# Patient Record
Sex: Male | Born: 1950 | Race: White | Hispanic: No | Smoking: Former smoker
Health system: Southern US, Community
[De-identification: ages and names within clinical notes are randomized; demographics above are authoritative.]

## PROBLEM LIST (undated history)

## (undated) DIAGNOSIS — J449 Chronic obstructive pulmonary disease, unspecified: Secondary | ICD-10-CM

## (undated) DIAGNOSIS — F32A Depression, unspecified: Secondary | ICD-10-CM

## (undated) DIAGNOSIS — Z8489 Family history of other specified conditions: Secondary | ICD-10-CM

## (undated) DIAGNOSIS — I10 Essential (primary) hypertension: Secondary | ICD-10-CM

## (undated) DIAGNOSIS — F419 Anxiety disorder, unspecified: Secondary | ICD-10-CM

## (undated) DIAGNOSIS — I4891 Unspecified atrial fibrillation: Secondary | ICD-10-CM

## (undated) DIAGNOSIS — F0390 Unspecified dementia without behavioral disturbance: Secondary | ICD-10-CM

## (undated) DIAGNOSIS — I6523 Occlusion and stenosis of bilateral carotid arteries: Secondary | ICD-10-CM

## (undated) DIAGNOSIS — N400 Enlarged prostate without lower urinary tract symptoms: Secondary | ICD-10-CM

## (undated) DIAGNOSIS — I509 Heart failure, unspecified: Secondary | ICD-10-CM

## (undated) HISTORY — DX: Occlusion and stenosis of bilateral carotid arteries: I65.23

## (undated) HISTORY — PX: TRANSURETHRAL RESECTION OF PROSTATE: SHX73

---

## 2020-07-28 ENCOUNTER — Emergency Department (HOSPITAL_BASED_OUTPATIENT_CLINIC_OR_DEPARTMENT_OTHER): Payer: Medicare Other

## 2020-07-28 ENCOUNTER — Inpatient Hospital Stay (HOSPITAL_BASED_OUTPATIENT_CLINIC_OR_DEPARTMENT_OTHER)
Admission: EM | Admit: 2020-07-28 | Discharge: 2020-08-09 | DRG: 291 | Disposition: A | Discharge status: Home Health | Payer: Medicare Other | Attending: Family Medicine | Admitting: Family Medicine

## 2020-07-28 ENCOUNTER — Encounter (HOSPITAL_BASED_OUTPATIENT_CLINIC_OR_DEPARTMENT_OTHER): Payer: Self-pay | Admitting: Emergency Medicine

## 2020-07-28 ENCOUNTER — Other Ambulatory Visit: Payer: Self-pay

## 2020-07-28 DIAGNOSIS — F17210 Nicotine dependence, cigarettes, uncomplicated: Secondary | ICD-10-CM | POA: Diagnosis present

## 2020-07-28 DIAGNOSIS — I2584 Coronary atherosclerosis due to calcified coronary lesion: Secondary | ICD-10-CM | POA: Diagnosis present

## 2020-07-28 DIAGNOSIS — I4891 Unspecified atrial fibrillation: Secondary | ICD-10-CM | POA: Diagnosis not present

## 2020-07-28 DIAGNOSIS — R739 Hyperglycemia, unspecified: Secondary | ICD-10-CM | POA: Diagnosis present

## 2020-07-28 DIAGNOSIS — J918 Pleural effusion in other conditions classified elsewhere: Secondary | ICD-10-CM | POA: Diagnosis present

## 2020-07-28 DIAGNOSIS — Z8041 Family history of malignant neoplasm of ovary: Secondary | ICD-10-CM

## 2020-07-28 DIAGNOSIS — I5031 Acute diastolic (congestive) heart failure: Secondary | ICD-10-CM | POA: Diagnosis present

## 2020-07-28 DIAGNOSIS — I480 Paroxysmal atrial fibrillation: Secondary | ICD-10-CM | POA: Diagnosis present

## 2020-07-28 DIAGNOSIS — I3139 Other pericardial effusion (noninflammatory): Secondary | ICD-10-CM | POA: Diagnosis present

## 2020-07-28 DIAGNOSIS — E669 Obesity, unspecified: Secondary | ICD-10-CM | POA: Diagnosis present

## 2020-07-28 DIAGNOSIS — Z6835 Body mass index (BMI) 35.0-35.9, adult: Secondary | ICD-10-CM | POA: Diagnosis not present

## 2020-07-28 DIAGNOSIS — I340 Nonrheumatic mitral (valve) insufficiency: Secondary | ICD-10-CM | POA: Diagnosis not present

## 2020-07-28 DIAGNOSIS — Z800 Family history of malignant neoplasm of digestive organs: Secondary | ICD-10-CM | POA: Diagnosis not present

## 2020-07-28 DIAGNOSIS — Z8349 Family history of other endocrine, nutritional and metabolic diseases: Secondary | ICD-10-CM

## 2020-07-28 DIAGNOSIS — Z79899 Other long term (current) drug therapy: Secondary | ICD-10-CM

## 2020-07-28 DIAGNOSIS — J449 Chronic obstructive pulmonary disease, unspecified: Secondary | ICD-10-CM | POA: Diagnosis present

## 2020-07-28 DIAGNOSIS — Z720 Tobacco use: Secondary | ICD-10-CM | POA: Diagnosis not present

## 2020-07-28 DIAGNOSIS — N400 Enlarged prostate without lower urinary tract symptoms: Secondary | ICD-10-CM | POA: Diagnosis present

## 2020-07-28 DIAGNOSIS — R0602 Shortness of breath: Secondary | ICD-10-CM | POA: Diagnosis present

## 2020-07-28 DIAGNOSIS — J90 Pleural effusion, not elsewhere classified: Secondary | ICD-10-CM

## 2020-07-28 DIAGNOSIS — I509 Heart failure, unspecified: Secondary | ICD-10-CM | POA: Diagnosis not present

## 2020-07-28 DIAGNOSIS — E66812 Obesity, class 2: Secondary | ICD-10-CM

## 2020-07-28 DIAGNOSIS — E7800 Pure hypercholesterolemia, unspecified: Secondary | ICD-10-CM | POA: Diagnosis not present

## 2020-07-28 DIAGNOSIS — Z8249 Family history of ischemic heart disease and other diseases of the circulatory system: Secondary | ICD-10-CM

## 2020-07-28 DIAGNOSIS — R7303 Prediabetes: Secondary | ICD-10-CM | POA: Diagnosis present

## 2020-07-28 DIAGNOSIS — F1010 Alcohol abuse, uncomplicated: Secondary | ICD-10-CM | POA: Diagnosis present

## 2020-07-28 DIAGNOSIS — R0902 Hypoxemia: Secondary | ICD-10-CM

## 2020-07-28 DIAGNOSIS — I110 Hypertensive heart disease with heart failure: Secondary | ICD-10-CM | POA: Diagnosis present

## 2020-07-28 DIAGNOSIS — Z888 Allergy status to other drugs, medicaments and biological substances status: Secondary | ICD-10-CM

## 2020-07-28 DIAGNOSIS — I700 Atherosclerosis of aorta: Secondary | ICD-10-CM | POA: Diagnosis present

## 2020-07-28 DIAGNOSIS — R0600 Dyspnea, unspecified: Secondary | ICD-10-CM

## 2020-07-28 DIAGNOSIS — R9431 Abnormal electrocardiogram [ECG] [EKG]: Secondary | ICD-10-CM | POA: Diagnosis present

## 2020-07-28 DIAGNOSIS — I10 Essential (primary) hypertension: Secondary | ICD-10-CM | POA: Diagnosis not present

## 2020-07-28 DIAGNOSIS — I4819 Other persistent atrial fibrillation: Secondary | ICD-10-CM

## 2020-07-28 DIAGNOSIS — J9601 Acute respiratory failure with hypoxia: Secondary | ICD-10-CM | POA: Diagnosis present

## 2020-07-28 DIAGNOSIS — E785 Hyperlipidemia, unspecified: Secondary | ICD-10-CM | POA: Diagnosis present

## 2020-07-28 DIAGNOSIS — I2510 Atherosclerotic heart disease of native coronary artery without angina pectoris: Secondary | ICD-10-CM | POA: Diagnosis present

## 2020-07-28 DIAGNOSIS — K760 Fatty (change of) liver, not elsewhere classified: Secondary | ICD-10-CM | POA: Diagnosis present

## 2020-07-28 DIAGNOSIS — J9811 Atelectasis: Secondary | ICD-10-CM | POA: Diagnosis present

## 2020-07-28 DIAGNOSIS — E871 Hypo-osmolality and hyponatremia: Secondary | ICD-10-CM | POA: Diagnosis present

## 2020-07-28 DIAGNOSIS — Z881 Allergy status to other antibiotic agents status: Secondary | ICD-10-CM

## 2020-07-28 DIAGNOSIS — I313 Pericardial effusion (noninflammatory): Secondary | ICD-10-CM | POA: Diagnosis present

## 2020-07-28 DIAGNOSIS — F32A Depression, unspecified: Secondary | ICD-10-CM | POA: Diagnosis present

## 2020-07-28 DIAGNOSIS — Z20822 Contact with and (suspected) exposure to covid-19: Secondary | ICD-10-CM | POA: Diagnosis present

## 2020-07-28 DIAGNOSIS — R059 Cough, unspecified: Secondary | ICD-10-CM

## 2020-07-28 DIAGNOSIS — Z9079 Acquired absence of other genital organ(s): Secondary | ICD-10-CM

## 2020-07-28 DIAGNOSIS — R0603 Acute respiratory distress: Secondary | ICD-10-CM

## 2020-07-28 DIAGNOSIS — J189 Pneumonia, unspecified organism: Secondary | ICD-10-CM

## 2020-07-28 HISTORY — DX: Atherosclerosis of aorta: I70.0

## 2020-07-28 HISTORY — DX: Hyperlipidemia, unspecified: E78.5

## 2020-07-28 HISTORY — DX: Depression, unspecified: F32.A

## 2020-07-28 HISTORY — DX: Essential (primary) hypertension: I10

## 2020-07-28 HISTORY — DX: Benign prostatic hyperplasia without lower urinary tract symptoms: N40.0

## 2020-07-28 HISTORY — DX: Family history of other specified conditions: Z84.89

## 2020-07-28 HISTORY — DX: Coronary atherosclerosis due to calcified coronary lesion: I25.84

## 2020-07-28 HISTORY — DX: Obesity, class 2: E66.812

## 2020-07-28 HISTORY — DX: Atherosclerotic heart disease of native coronary artery without angina pectoris: I25.10

## 2020-07-28 HISTORY — DX: Obesity, unspecified: E66.9

## 2020-07-28 HISTORY — DX: Tobacco use: Z72.0

## 2020-07-28 LAB — CBC WITH DIFFERENTIAL/PLATELET
Abs Immature Granulocytes: 0.04 10*3/uL (ref 0.00–0.07)
Basophils Absolute: 0 10*3/uL (ref 0.0–0.1)
Basophils Relative: 0 %
Eosinophils Absolute: 0 10*3/uL (ref 0.0–0.5)
Eosinophils Relative: 0 %
HCT: 44.4 % (ref 39.0–52.0)
Hemoglobin: 15.8 g/dL (ref 13.0–17.0)
Immature Granulocytes: 1 %
Lymphocytes Relative: 6 %
Lymphs Abs: 0.5 10*3/uL — ABNORMAL LOW (ref 0.7–4.0)
MCH: 33.1 pg (ref 26.0–34.0)
MCHC: 35.6 g/dL (ref 30.0–36.0)
MCV: 93.1 fL (ref 80.0–100.0)
Monocytes Absolute: 0.9 10*3/uL (ref 0.1–1.0)
Monocytes Relative: 11 %
Neutro Abs: 6.7 10*3/uL (ref 1.7–7.7)
Neutrophils Relative %: 82 %
Platelets: 209 10*3/uL (ref 150–400)
RBC: 4.77 MIL/uL (ref 4.22–5.81)
RDW: 12.8 % (ref 11.5–15.5)
WBC: 8.2 10*3/uL (ref 4.0–10.5)
nRBC: 0 % (ref 0.0–0.2)

## 2020-07-28 LAB — BRAIN NATRIURETIC PEPTIDE: B Natriuretic Peptide: 302.6 pg/mL — ABNORMAL HIGH (ref 0.0–100.0)

## 2020-07-28 LAB — COMPREHENSIVE METABOLIC PANEL
ALT: 14 U/L (ref 0–44)
AST: 19 U/L (ref 15–41)
Albumin: 4.1 g/dL (ref 3.5–5.0)
Alkaline Phosphatase: 102 U/L (ref 38–126)
Anion gap: 10 (ref 5–15)
BUN: 10 mg/dL (ref 8–23)
CO2: 30 mmol/L (ref 22–32)
Calcium: 8.7 mg/dL — ABNORMAL LOW (ref 8.9–10.3)
Chloride: 84 mmol/L — ABNORMAL LOW (ref 98–111)
Creatinine, Ser: 0.5 mg/dL — ABNORMAL LOW (ref 0.61–1.24)
GFR, Estimated: >60 mL/min (ref >60)
Glucose, Bld: 130 mg/dL — ABNORMAL HIGH (ref 70–99)
Potassium: 4.3 mmol/L (ref 3.5–5.1)
Sodium: 124 mmol/L — ABNORMAL LOW (ref 135–145)
Total Bilirubin: 1 mg/dL (ref 0.3–1.2)
Total Protein: 7.2 g/dL (ref 6.5–8.1)

## 2020-07-28 LAB — TROPONIN I (HIGH SENSITIVITY)
Troponin I (High Sensitivity): 2 ng/L (ref <18)
Troponin I (High Sensitivity): 2 ng/L (ref <18)

## 2020-07-28 LAB — D-DIMER, QUANTITATIVE: D-Dimer, Quant: 1.21 ug/mL-FEU — ABNORMAL HIGH (ref 0.00–0.50)

## 2020-07-28 LAB — TSH: TSH: 1.92 u[IU]/mL (ref 0.350–4.500)

## 2020-07-28 LAB — LACTIC ACID, PLASMA
Lactic Acid, Venous: 2.1 mmol/L (ref 0.5–1.9)
Lactic Acid, Venous: 0.8 mmol/L (ref 0.5–1.9)

## 2020-07-28 LAB — RESP PANEL BY RT-PCR (FLU A&B, COVID) ARPGX2
Influenza A by PCR: NEGATIVE
Influenza B by PCR: NEGATIVE
SARS Coronavirus 2 by RT PCR: NEGATIVE

## 2020-07-28 LAB — MAGNESIUM: Magnesium: 1.6 mg/dL — ABNORMAL LOW (ref 1.7–2.4)

## 2020-07-28 MED ORDER — HEPARIN (PORCINE) 25000 UT/250ML-% IV SOLN
1800.0000 [IU]/h | INTRAVENOUS | Status: DC
  Administered 2020-07-28: 1300 [IU]/h via INTRAVENOUS
  Administered 2020-07-29 (×2): 1600 [IU]/h via INTRAVENOUS
  Administered 2020-07-30 – 2020-07-31 (×2): 1650 [IU]/h via INTRAVENOUS
  Administered 2020-08-01 – 2020-08-02 (×3): 1800 [IU]/h via INTRAVENOUS
  Filled 2020-07-28 (×10): qty 250

## 2020-07-28 MED ORDER — POTASSIUM CHLORIDE CRYS ER 20 MEQ PO TBCR
20.0000 meq | EXTENDED_RELEASE_TABLET | Freq: Every day | ORAL | Status: DC
  Administered 2020-07-28 – 2020-08-03 (×7): 20 meq via ORAL
  Filled 2020-07-28 (×7): qty 1

## 2020-07-28 MED ORDER — PRAVASTATIN SODIUM 10 MG PO TABS
20.0000 mg | ORAL_TABLET | Freq: Every day | ORAL | Status: DC
Start: 2020-07-29 — End: 2020-08-03
  Administered 2020-07-29 – 2020-08-02 (×5): 20 mg via ORAL
  Filled 2020-07-28 (×5): qty 2

## 2020-07-28 MED ORDER — FUROSEMIDE 10 MG/ML IJ SOLN
20.0000 mg | Freq: Two times a day (BID) | INTRAMUSCULAR | Status: DC
  Administered 2020-07-28 – 2020-07-29 (×2): 20 mg via INTRAVENOUS
  Filled 2020-07-28 (×2): qty 2

## 2020-07-28 MED ORDER — ACETAMINOPHEN 650 MG RE SUPP: 650.0000 mg | Freq: Four times a day (QID) | RECTAL | Status: DC | PRN

## 2020-07-28 MED ORDER — IOHEXOL 350 MG/ML SOLN
100.0000 mL | Freq: Once | INTRAVENOUS | Status: AC | PRN
  Administered 2020-07-28: 100 mL via INTRAVENOUS

## 2020-07-28 MED ORDER — SODIUM CHLORIDE 0.9 % IV SOLN
1.0000 g | Freq: Once | INTRAVENOUS | Status: AC
  Administered 2020-07-28: 1 g via INTRAVENOUS
  Filled 2020-07-28: qty 10

## 2020-07-28 MED ORDER — ADULT MULTIVITAMIN W/MINERALS CH
1.0000 | ORAL_TABLET | Freq: Every day | ORAL | Status: DC
  Administered 2020-07-29 – 2020-08-09 (×12): 1 via ORAL
  Filled 2020-07-28 (×12): qty 1

## 2020-07-28 MED ORDER — MAGNESIUM SULFATE 2 GM/50ML IV SOLN
2.0000 g | Freq: Once | INTRAVENOUS | Status: AC
  Administered 2020-07-28: 2 g via INTRAVENOUS
  Filled 2020-07-28: qty 50

## 2020-07-28 MED ORDER — FOLIC ACID 1 MG PO TABS
1.0000 mg | ORAL_TABLET | Freq: Every day | ORAL | Status: DC
  Administered 2020-07-29 – 2020-08-09 (×12): 1 mg via ORAL
  Filled 2020-07-28 (×12): qty 1

## 2020-07-28 MED ORDER — MONTELUKAST SODIUM 10 MG PO TABS
10.0000 mg | ORAL_TABLET | Freq: Every day | ORAL | Status: DC
  Administered 2020-07-29 – 2020-08-09 (×12): 10 mg via ORAL
  Filled 2020-07-28 (×12): qty 1

## 2020-07-28 MED ORDER — FUROSEMIDE 10 MG/ML IJ SOLN
20.0000 mg | Freq: Two times a day (BID) | INTRAMUSCULAR | Status: DC
Start: 2020-07-29 — End: 2020-07-28

## 2020-07-28 MED ORDER — ACETAMINOPHEN 325 MG PO TABS
650.0000 mg | ORAL_TABLET | Freq: Four times a day (QID) | ORAL | Status: DC | PRN
  Administered 2020-07-31 – 2020-08-04 (×7): 650 mg via ORAL
  Filled 2020-07-28 (×7): qty 2

## 2020-07-28 MED ORDER — HEPARIN BOLUS VIA INFUSION
4500.0000 [IU] | Freq: Once | INTRAVENOUS | Status: AC
  Administered 2020-07-28: 4500 [IU] via INTRAVENOUS

## 2020-07-28 MED ORDER — FLUOXETINE HCL 20 MG PO CAPS
20.0000 mg | ORAL_CAPSULE | Freq: Every day | ORAL | Status: DC
  Administered 2020-07-28 – 2020-08-08 (×12): 20 mg via ORAL
  Filled 2020-07-28 (×12): qty 1

## 2020-07-28 MED ORDER — LISINOPRIL 5 MG PO TABS
2.5000 mg | ORAL_TABLET | Freq: Every day | ORAL | Status: DC
  Administered 2020-07-28 – 2020-07-29 (×2): 2.5 mg via ORAL
  Filled 2020-07-28 (×2): qty 1

## 2020-07-28 MED ORDER — TERAZOSIN HCL 5 MG PO CAPS
5.0000 mg | ORAL_CAPSULE | Freq: Every day | ORAL | Status: DC
  Administered 2020-07-29 – 2020-08-09 (×12): 5 mg via ORAL
  Filled 2020-07-28 (×12): qty 1

## 2020-07-28 MED ORDER — PROCHLORPERAZINE EDISYLATE 10 MG/2ML IJ SOLN
10.0000 mg | Freq: Four times a day (QID) | INTRAMUSCULAR | Status: DC | PRN
  Filled 2020-07-28: qty 2

## 2020-07-28 MED ORDER — SODIUM CHLORIDE 0.9 % IV SOLN
500.0000 mg | Freq: Once | INTRAVENOUS | Status: AC
  Administered 2020-07-28: 500 mg via INTRAVENOUS
  Filled 2020-07-28: qty 500

## 2020-07-28 MED ORDER — THIAMINE HCL 100 MG PO TABS
100.0000 mg | ORAL_TABLET | Freq: Every day | ORAL | Status: DC
  Administered 2020-07-29 – 2020-08-09 (×12): 100 mg via ORAL
  Filled 2020-07-28 (×12): qty 1

## 2020-07-28 MED ORDER — METOPROLOL TARTRATE 50 MG PO TABS
50.0000 mg | ORAL_TABLET | Freq: Two times a day (BID) | ORAL | Status: DC
  Administered 2020-07-29 – 2020-08-03 (×10): 50 mg via ORAL
  Filled 2020-07-28 (×10): qty 1

## 2020-07-28 NOTE — ED Triage Notes (Signed)
Reports having SOB for about three days.  Denies any hx of respiratory issues.  Also endorses fatigue.

## 2020-07-28 NOTE — ED Notes (Signed)
Pt's shirt, glasses, cell phone, med list placed in belongings bag and given to pt.

## 2020-07-28 NOTE — ED Notes (Signed)
Report given to [facility]. 

## 2020-07-28 NOTE — ED Provider Notes (Signed)
[facility] EMERGENCY DEPARTMENT Provider Note   CSN: [id number] Arrival date & time: 07/28/20  1006     History Chief Complaint  Patient presents with   Shortness of Breath    Samuel Barnes is a 70 y.o. male.  The history is provided by the patient and medical records. No language interpreter was used.  Shortness of Breath Severity:  Severe Onset quality:  Gradual Duration:  4 days Timing:  Constant Progression:  Waxing and waning Chronicity:  New Context: URI   Relieved by:  Nothing Worsened by:  Exertion, deep breathing and coughing Ineffective treatments:  None tried Associated symptoms: cough and sputum production   Associated symptoms: no abdominal pain, no chest pain, no claudication, no diaphoresis, no ear pain, no fever (chills at home), no headaches, no neck pain, no rash, no vomiting and no wheezing   Risk factors: no hx of PE/DVT       Past Medical History:  Diagnosis Date   BPH (benign prostatic hyperplasia)    Depression    Elevated cholesterol    Hypertension     There are no problems to display for this patient.     No family history on file.  Social History   Tobacco Use   Smoking status: Every Day    Types: Cigarettes   Smokeless tobacco: Never  Vaping Use   Vaping Use: Never used  Substance Use Topics   Alcohol use: Yes    Alcohol/week: 1.0 standard drink    Types: 1 Shots of liquor per week    Comment: 3-4 times a week   Drug use: Never    Home Medications Prior to Admission medications   Not on File    Allergies    Patient has no allergy information on record.  Review of Systems   Review of Systems  Constitutional:  Positive for chills and fatigue. Negative for diaphoresis and fever (chills at home).  HENT:  Negative for congestion and ear pain.   Eyes:  Negative for visual disturbance.  Respiratory:  Positive for cough, sputum production, chest tightness and shortness of breath. Negative for  wheezing.   Cardiovascular:  Negative for chest pain, palpitations, claudication and leg swelling.  Gastrointestinal:  Negative for abdominal distention, abdominal pain, constipation, diarrhea, nausea and vomiting.  Genitourinary:  Negative for flank pain.  Musculoskeletal:  Negative for back pain, neck pain and neck stiffness.  Skin:  Negative for rash and wound.  Neurological:  Positive for light-headedness. Negative for dizziness, syncope, weakness and headaches.  Psychiatric/Behavioral:  Negative for agitation and confusion.   All other systems reviewed and are negative.  Physical Exam Updated Vital Signs BP (!) 158/99 (BP Location: Right Arm)   Pulse 71   Temp 97.9 F (36.6 C) (Oral)   Resp 20   Ht 5\' 8"  (1.727 m)   Wt 104.9 kg   SpO2 97%   BMI 35.16 kg/m   Physical Exam Vitals and nursing note reviewed.  Constitutional:      General: He is not in acute distress.    Appearance: He is well-developed. He is not ill-appearing, toxic-appearing or diaphoretic.  HENT:     Head: Normocephalic and atraumatic.     Mouth/Throat:     Mouth: Mucous membranes are moist.     Pharynx: No pharyngeal swelling or oropharyngeal exudate.  Eyes:     Conjunctiva/sclera: Conjunctivae normal.  Cardiovascular:     Rate and Rhythm: Normal rate. Rhythm irregular.  Pulses: Normal pulses.     Heart sounds: No murmur heard. Pulmonary:     Effort: Pulmonary effort is normal. Tachypnea present. No respiratory distress.     Breath sounds: Decreased breath sounds (mildly diffuse) and rhonchi present. No wheezing or rales.  Chest:     Chest wall: No tenderness.  Abdominal:     Palpations: Abdomen is soft.     Tenderness: There is no abdominal tenderness.  Musculoskeletal:     Cervical back: Neck supple.     Right lower leg: No tenderness. No edema.     Left lower leg: No tenderness. No edema.  Skin:    General: Skin is warm and dry.     Capillary Refill: Capillary refill takes less than 2  seconds.     Findings: No erythema.  Neurological:     General: No focal deficit present.     Mental Status: He is alert.  Psychiatric:        Mood and Affect: Mood normal. Mood is not anxious.    ED Results / Procedures / Treatments   Labs (all labs ordered are listed, but only abnormal results are displayed) Labs Reviewed  CBC WITH DIFFERENTIAL/PLATELET - Abnormal; Notable for the following components:      Result Value   Lymphs Abs 0.5 (*)    All other components within normal limits  COMPREHENSIVE METABOLIC PANEL - Abnormal; Notable for the following components:   Sodium 124 (*)    Chloride 84 (*)    Glucose, Bld 130 (*)    Creatinine, Ser 0.50 (*)    Calcium 8.7 (*)    All other components within normal limits  LACTIC ACID, PLASMA - Abnormal; Notable for the following components:   Lactic Acid, Venous 2.1 (*)    All other components within normal limits  D-DIMER, QUANTITATIVE - Abnormal; Notable for the following components:   D-Dimer, Quant 1.21 (*)    All other components within normal limits  MAGNESIUM - Abnormal; Notable for the following components:   Magnesium 1.6 (*)    All other components within normal limits  BRAIN NATRIURETIC PEPTIDE - Abnormal; Notable for the following components:   B Natriuretic Peptide 302.6 (*)    All other components within normal limits  RESP PANEL BY RT-PCR (FLU A&B, COVID) ARPGX2  CULTURE, BLOOD (ROUTINE X 2)  CULTURE, BLOOD (ROUTINE X 2)  LACTIC ACID, PLASMA  TSH  TROPONIN I (HIGH SENSITIVITY)  TROPONIN I (HIGH SENSITIVITY)    EKG EKG Interpretation  Date/Time:  Friday July 28 2020 10:10:52 EDT Ventricular Rate:  80 PR Interval:    QRS Duration: 89 QT Interval:  525 QTC Calculation: 571 R Axis:   30 Text Interpretation: Atrial fibrillation Ventricular premature complex Anterior infarct, age indeterminate Prolonged QT interval NO prior ECG for comparison. No STEMI Confirmed by Theda Belfast ([id number]) on 07/28/2020 10:22:54  AM  Radiology CT Angio Chest PE W and/or Wo Contrast  Result Date: 07/28/2020 CLINICAL DATA:  Shortness of breath for 3 days.  Elevated D-dimer. EXAM: CT ANGIOGRAPHY CHEST WITH CONTRAST TECHNIQUE: Multidetector CT imaging of the chest was performed using the standard protocol during bolus administration of intravenous contrast. Multiplanar CT image reconstructions and MIPs were obtained to evaluate the vascular anatomy. CONTRAST:  OMNIPAQUE IOHEXOL 350 MG/ML SOLN COMPARISON:  CT chest, abdomen and pelvis 04/18/2017. Single-view of the chest today and PA and lateral chest 03/02/2017. FINDINGS: Cardiovascular: Satisfactory of opacification of the pulmonary arteries to the segmental level. No  pulmonary embolus is seen. There is cardiomegaly. Small pericardial effusion is present. No aortic aneurysm. Calcific aortic and coronary atherosclerosis. Mediastinum/Nodes: No enlarged mediastinal, hilar, or axillary lymph nodes. Thyroid gland, trachea, and esophagus demonstrate no significant findings. Lungs/Pleura: Moderate right and very small left pleural effusions are seen. There is compressive atelectasis in the right lower lobe due to the patient's effusion. No consolidative process is identified. No nodule or mass is seen. Upper Abdomen: The liver is low attenuating consistent with fatty infiltration. A simple cyst measuring 3.4 cm is identified. There is some thickening of the adrenal glands compatible with hyperplasia, unchanged. Left renal cysts are partially imaged. Musculoskeletal: No acute abnormality or focal lesion. Remote healed right rib fractures are noted. Review of the MIP images confirms the above findings. IMPRESSION: Negative for pulmonary embolus. Moderate right and small left pleural effusions with compressive atelectasis in the right lower lobe. Cause for the effusions is not identified. Cardiomegaly. Small pericardial effusion. Calcific coronary artery disease. Fatty infiltration of the  liver. Aortic Atherosclerosis (ICD10-I70.0). Electronically Signed   By: Drusilla Kannerhomas  Dalessio M.D.   On: 07/28/2020 13:37   DG Chest Portable 1 View  Result Date: 07/28/2020 CLINICAL DATA:  cough, chills, hypoxia, shortness of breath. new afib EXAM: PORTABLE CHEST 1 VIEW COMPARISON:  04/09/2017 FINDINGS: Mild cardiomegaly. Cardiac silhouette has enlarged compared to the previous study. Atherosclerotic calcification of the aortic knob. Moderate-sized right-sided pleural effusion with hazy right basilar airspace opacity. No left-sided pleural effusion is seen. No pneumothorax. Multiple chronic right-sided rib fractures. IMPRESSION: 1. Moderate right-sided pleural effusion with hazy right basilar airspace opacity, which may reflect atelectasis versus pneumonia. 2. Cardiac silhouette has enlarged compared to the previous study. Underlying pericardial effusion not excluded. Electronically Signed   By: Duanne GuessNicholas  Plundo D.O.   On: 07/28/2020 11:17    Procedures Procedures     CHA2DS2-VASc Score =    This indicates a 2.2% annual risk of stroke. The patient's score is based upon: CHF History: No HTN History: Yes Diabetes History: No Stroke History: No Vascular Disease History: No Age Score: 1 Gender Score: 0     Medications Ordered in ED Medications  cefTRIAXone (ROCEPHIN) 1 g in sodium chloride 0.9 % 100 mL IVPB (has no administration in time range)  azithromycin (ZITHROMAX) 500 mg in sodium chloride 0.9 % 250 mL IVPB (has no administration in time range)  iohexol (OMNIPAQUE) 350 MG/ML injection 100 mL (100 mLs Intravenous Contrast Given 07/28/20 1252)    ED Course  I have reviewed the triage vital signs and the nursing notes.  Pertinent labs & imaging results that were available during my care of the patient were reviewed by me and considered in my medical decision making (see chart for details).    MDM Rules/Calculators/A&P                           Jake ChurchHughes P Edwards is a 70 y.o.  male with a past medical history significant for hypertension, hypercholesterolemia, BPH status post TURP who presents with several days of worsening fatigue, shortness of breath, mildly productive cough, chills, and lightheadedness.  Patient reports that he has not had any fevers at home but felt some chills.  He reports he has had significant fatigue with no energy and today was having worsening shortness of breath.  He denies any chest pain or palpitations and denies any history of A. fib or a flutter.  No reported history of cardiac difficulties.  He denies new leg pain or leg swelling.  Denies any trauma.  He denies nausea, vomiting, constipation, diarrhea, or urinary changes.  On arrival, patient was hypoxic in the mid 80s.  He is now on 4 L to maintain oxygen saturations in the low 90s.  On exam, lungs have some rhonchi and poor air movement but did not appreciate significant wheezing.  Chest and abdomen nontender.  Good pulses in extremities.  No significant edema seen.  Patient was not tachycardic but was slightly tachypneic.  EKG on arrival shows atrial fibrillation.  Patient does not have palpitations and has no history of this, unclear when he went into A. fib but this could be contributing to his fatigue.  Clinically I suspect patient may have either pneumonia, fluid overload, COVID, or even a PE.  We will get screening labs and work-up.  As he is now on 4 L and is now on oxygen normally, he will need admission.  2:30 PM Work-up as continue to return.  Patient had an elevated D-dimer and a CT PE study was ordered.  No evidence of PE but does show some pleural effusions and atelectasis.  Given the patient's cough, chills, and hypoxia with this and the x-ray showing concern for pneumonia, will treat with antibiotics for pneumonia.  Patient is in A. fib so I suspect that A. fib may have contributed to some fluid overload.  Heart rate is in the 80s however and he is rate controlled.  He has no  history of A. fib.  Due to the hypoxia, new A. fib, and pneumonia, patient will be admitted for further management.  COVID and flu are negative.   Final Clinical Impression(s) / ED Diagnoses Final diagnoses:  New onset a-fib (HCC)  Community acquired pneumonia, unspecified laterality  Pleural effusion  Hypoxia  Cough     Clinical Impression: 1. New onset a-fib (HCC)   2. Community acquired pneumonia, unspecified laterality   3. Pleural effusion   4. Hypoxia   5. Cough     Disposition: Admit  This note was prepared with assistance of Dragon voice recognition software. Occasional wrong-word or sound-a-like substitutions may have occurred due to the inherent limitations of voice recognition software.     Nathian Stencil, Canary Brim, MD 07/28/20 [phone]

## 2020-07-28 NOTE — Progress Notes (Addendum)
Patient arrived to unit with Heparin gtt at 1300 units per hour. Tele monitor placed on patient, vitals obtained. Patient placement alerted of patient arrival. Dinner tray order.

## 2020-07-28 NOTE — Progress Notes (Signed)
ANTICOAGULATION CONSULT NOTE - Initial Consult  Pharmacy Consult for heparin Indication: atrial fibrillation  Not on File  Patient Measurements: Height: 5\' 8"  (172.7 cm) Weight: 104.9 kg (231 lb 4.2 oz) IBW/kg (Calculated) : 68.4 Heparin Dosing Weight: 91.3kg  Vital Signs: Temp: 97.9 F (36.6 C) (07/22 1008) Temp Source: Oral (07/22 1008) BP: 153/93 (07/22 1512) Pulse Rate: 72 (07/22 1512)  Labs: Recent Labs    07/28/20 1051 07/28/20 1249  HGB 15.8  --   HCT 44.4  --   PLT 209  --   CREATININE 0.50*  --   TROPONINIHS 2 2    Estimated Creatinine Clearance: 99.4 mL/min (A) (by C-G formula based on SCr of 0.5 mg/dL (L)).   Medical History: Past Medical History:  Diagnosis Date   BPH (benign prostatic hyperplasia)    Depression    Elevated cholesterol    Hypertension     Assessment: 89 YOM presenting with fatigue, SOB, with new onset afib.  He is not on anticoagulation PTA, CBC wnl.  Goal of Therapy:  Heparin level 0.3-0.7 units/ml Monitor platelets by anticoagulation protocol: Yes   Plan:  Heparin 4500 units IV x 1, and gtt at 1300 units/hr F/u 8 hour heparin level F/u cards eval and long term [facility] plan  [facility], PharmD Clinical Pharmacist ED Pharmacist Phone # [phone] 07/28/2020 4:00 PM

## 2020-07-28 NOTE — H&P (Signed)
History and Physical    Samuel Barnes [facility] SEG:[id number] DOB: [birth year] DOA: 07/28/2020  PCP: Pcp, No   Patient coming from: Home.   I have personally briefly reviewed patient's old medical records in [facility]  Chief Complaint: Shortness of breath.  HPI: Samuel Barnes is a 70 y.o. male with medical history significant of aortic atherosclerosis, BPH, class II obesity, coronary artery calcification, depression, hyperlipidemia, hypertension, alcohol and tobacco use who is coming from [facility] where he presented with the 5-day history of progressively worse dyspnea associated with whitish color sputum and wheezing.  The patient stated that walking from 1 room to another at home is giving him significant dyspnea.  He denies CP on exertion, but also mentioned that on Tuesday he had pain across his shoulder blades that lasted into Wednesday relieved by several doses of OTC ibuprofen.  He denies PND, orthopnea or pitting edema of the lower extremities.  However, he mentioned that he has had some difficulty sleeping and his appetite is decreased.  He denies headache, fever, chills, rhinorrhea, sore throat or hemoptysis.  No abdominal pain, nausea, emesis, diarrhea, constipation, melena or hematochezia.  No dysuria, frequency or hematuria.  ED Course: Initial vital signs were temperature 97.9 F, pulse 71, respirations 17, BP 158/99 mmHg O2 sat 96% on room air.  The patient was started on a heparin infusion and supplemental oxygen.  He also received magnesium sulfate 2 g IVPB.  There was initial suspicion for pneumonia and the patient preemptively was given a dose of ceftriaxone and azithromycin.  Lab work: CBC showed a white count of 8.2 with 82% neutrophils, hemoglobin 15.8 g/dL platelets 737.  D-dimer was 1.21 mcg/mL.  Lactic acid was 2.1 and then 0.8 mmol/L.  Troponin was 2 ng/L x 2.  BNP was 302.6 pg milliliter.  Sodium was 124 and chloride 84 mmol/L.  Glucose 130, calcium is 8.6 and  magnesium 1.6 mg/dL.  The rest of the chemistry results including LFTs, renal function and TSH are unremarkable.  Imaging: A portable 1 view chest radiograph showed a moderate right-sided pleural effusion with hazy right vascular airspace opacity which may be atelectasis or pneumonia.  The cardiac silhouette has enlarged when compared to the previous film.  CTA chest was negative for pulmonary embolus.  There were moderate right and small left pleural effusions with compressive atelectasis in the right lower lobe.  There is a small pericardial effusion.  There is calcific coronary artery disease.  There is fatty infiltration of the liver and aortic atherosclerosis.  Please see images and full radiology report for further detail.  Review of Systems: As per HPI otherwise all other systems reviewed and are negative.  Past Medical History:  Diagnosis Date   Aortic atherosclerosis (HCC) 07/28/2020   BPH (benign prostatic hyperplasia)    Class 2 obesity 07/28/2020   Coronary artery calcification 07/28/2020   Depression    Hyperlipidemia 07/28/2020   Hypertension    Tobacco use 07/28/2020    Past Surgical History:  Procedure Laterality Date   TRANSURETHRAL RESECTION OF PROSTATE      Social History  reports that he has been smoking cigarettes. He has never used smokeless tobacco. He reports current alcohol use of about 1.0 standard drink of alcohol per week. He reports that he does not use drugs.  Not on File  Family History  Problem Relation Age of Onset   Coronary artery disease Mother    Hypertension Mother    Hyperlipidemia Mother  Ovarian cancer Mother    Ovarian cancer Sister    Liver cancer Sister    Liver cancer Brother    Prior to Admission medications   Medication Sig Start Date End Date Taking? Authorizing Provider  amLODipine (NORVASC) 10 MG tablet Take 10 mg by mouth daily.   Yes [provider]  FLUoxetine (PROZAC) 20 MG capsule Take 20 mg by mouth at bedtime.    Yes [provider]  lovastatin (MEVACOR) 20 MG tablet Take 20 mg by mouth daily.   Yes [provider]  metoprolol tartrate (LOPRESSOR) 50 MG tablet Take 50 mg by mouth 2 (two) times daily.   Yes [provider]  montelukast (SINGULAIR) 10 MG tablet Take 10 mg by mouth daily.   Yes [provider]  terazosin (HYTRIN) 5 MG capsule Take 5 mg by mouth daily.   Yes [provider]    Physical Exam: Vitals:   07/28/20 1512 07/28/20 1600 07/28/20 1658 07/28/20 1849  BP: (!) 153/93 (!) 161/91 (!) 158/89 (!) 159/94  Pulse: 72 85 65 69  Resp: 18 17 18 18   Temp:    (!) 97.5 F (36.4 C)  TempSrc:      SpO2: 94% 94% 97% 96%  Weight:      Height:        Constitutional: NAD, calm, comfortable Eyes: PERRL, lids and conjunctivae normal ENMT: Nasal cannula in place.  Mucous membranes are moist. Posterior pharynx clear of any exudate or lesions. Neck: normal, supple, no masses, no thyromegaly Respiratory: Decreased breath sounds on bases with bilateral lower field crackles. Normal respiratory effort. No accessory muscle use.  Cardiovascular: Irregularly irregular rhythm with a HR in the 80s, no murmurs / rubs / gallops.  3+ lower bilateral extremity pitting edema. 2+ pedal pulses.  Abdomen: Obese, no distention.  Bowel sounds positive.  Soft, no tenderness, no masses palpated. No hepatosplenomegaly. Musculoskeletal: no clubbing / cyanosis. Good ROM, no contractures. Normal muscle tone.  Skin: no acute rashes, lesions, ulcers on very limited dermatological examination. Neurologic: CN 2-12 grossly intact. Sensation intact, DTR normal. Strength 5/5 in all 4.  Psychiatric: Normal judgment and insight. Alert and oriented x 3. Normal mood.   Labs on Admission: I have personally reviewed following labs and imaging studies  CBC: Recent Labs  Lab 07/28/20 1051  WBC 8.2  NEUTROABS 6.7  HGB 15.8  HCT 44.4  MCV 93.1  PLT 209    Basic Metabolic  Panel: Recent Labs  Lab 07/28/20 1051  NA 124*  K 4.3  CL 84*  CO2 30  GLUCOSE 130*  BUN 10  CREATININE 0.50*  CALCIUM 8.7*  MG 1.6*    GFR: Estimated Creatinine Clearance: 99.4 mL/min (A) (by C-G formula based on SCr of 0.5 mg/dL (L)).  Liver Function Tests: Recent Labs  Lab 07/28/20 1051  AST 19  ALT 14  ALKPHOS 102  BILITOT 1.0  PROT 7.2  ALBUMIN 4.1   Radiological Exams on Admission: CT Angio Chest PE W and/or Wo Contrast  Result Date: 07/28/2020 CLINICAL DATA:  Shortness of breath for 3 days.  Elevated D-dimer. EXAM: CT ANGIOGRAPHY CHEST WITH CONTRAST TECHNIQUE: Multidetector CT imaging of the chest was performed using the standard protocol during bolus administration of intravenous contrast. Multiplanar CT image reconstructions and MIPs were obtained to evaluate the vascular anatomy. CONTRAST:  100mL OMNIPAQUE IOHEXOL 350 MG/ML SOLN COMPARISON:  CT chest, abdomen and pelvis 04/18/2017. Single-view of the chest today and PA and lateral chest 03/02/2017.  FINDINGS: Cardiovascular: Satisfactory of opacification of the pulmonary arteries to the segmental level. No pulmonary embolus is seen. There is cardiomegaly. Small pericardial effusion is present. No aortic aneurysm. Calcific aortic and coronary atherosclerosis. Mediastinum/Nodes: No enlarged mediastinal, hilar, or axillary lymph nodes. Thyroid gland, trachea, and esophagus demonstrate no significant findings. Lungs/Pleura: Moderate right and very small left pleural effusions are seen. There is compressive atelectasis in the right lower lobe due to the patient's effusion. No consolidative process is identified. No nodule or mass is seen. Upper Abdomen: The liver is low attenuating consistent with fatty infiltration. A simple cyst measuring 3.4 cm is identified. There is some thickening of the adrenal glands compatible with hyperplasia, unchanged. Left renal cysts are partially imaged. Musculoskeletal: No acute abnormality or  focal lesion. Remote healed right rib fractures are noted. Review of the MIP images confirms the above findings. IMPRESSION: Negative for pulmonary embolus. Moderate right and small left pleural effusions with compressive atelectasis in the right lower lobe. Cause for the effusions is not identified. Cardiomegaly. Small pericardial effusion. Calcific coronary artery disease. Fatty infiltration of the liver. Aortic Atherosclerosis (ICD10-I70.0). Electronically Signed   By: Drusilla Kanner M.D.   On: 07/28/2020 13:37   DG Chest Portable 1 View  Result Date: 07/28/2020 CLINICAL DATA:  cough, chills, hypoxia, shortness of breath. new afib EXAM: PORTABLE CHEST 1 VIEW COMPARISON:  04/09/2017 FINDINGS: Mild cardiomegaly. Cardiac silhouette has enlarged compared to the previous study. Atherosclerotic calcification of the aortic knob. Moderate-sized right-sided pleural effusion with hazy right basilar airspace opacity. No left-sided pleural effusion is seen. No pneumothorax. Multiple chronic right-sided rib fractures. IMPRESSION: 1. Moderate right-sided pleural effusion with hazy right basilar airspace opacity, which may reflect atelectasis versus pneumonia. 2. Cardiac silhouette has enlarged compared to the previous study. Underlying pericardial effusion not excluded. Electronically Signed   By: Duanne Guess D.O.   On: 07/28/2020 11:17    EKG: Independently reviewed.  Vent. rate 80 BPM PR interval * ms QRS duration 89 ms QT/QTcB 525/571 ms P-R-T axes * 30 90 Atrial fibrillation Ventricular premature complex Anterior infarct, age indeterminate Prolonged QT interval  Assessment/Plan Principal Problem:   Acute respiratory failure with hypoxia (HCC) In the setting of clinical heart failure with  associated   Pericardial and pleural effusion   Hypervolemic hyponatremia Telemetry/inpatient. Fluid and sodium restriction. Monitor daily weights, intake and output. Begin furosemide 20 mg twice a  day. K-Lor 20 mEq p.o. daily. Monitor electrolytes and renal function. Hold metoprolol till a.m. Begin lisinopril 2.5 mg p.o. daily. Hold amlodipine as ACE inhibitor just added. Obtaining echocardiogram. Cardiology has been consulted.  Active Problems:   New onset paroxysmal or permanent atrial fibrillation (HCC) CHA?DS?-VASc Score of at least 4. Continue heparin infusion. Transition to oral AC before discharge. Beta-blocker held for 1 dose in the setting of acute HF.    Hypertension Hold amlodipine as ACE/diuretic added. Metoprolol held tonight due to HF symptoms. Continue Hytrin 5 mg p.o. daily. Resume metoprolol in AM.    Coronary artery calcification Smoking cessation advised. Continue beta-blocker and statin. Currently on heparin infusion. Cardiac catheterization to be considered in the near future.    Hyperlipidemia   Aortic atherosclerosis (HCC) Continue mevastatin or formulary equivalent.    Hypomagnesemia Received 2 g of mag sulfate earlier. Follow-up magnesium level.    Hypocalcemia Recheck level in AM. Magnesium was supplemented.    Class 2 obesity Weight loss advised. Follow-up with primary care provider.      Hyperglycemia Nonfasting measurement. Follow-up  a.m. glucose level.      BPH (benign prostatic hyperplasia) Continue Hytrin 5 mg p.o. daily.    Depression Continue fluoxetine 20 mg p.o. daily.    Alcohol abuse He has 2-3 drinks whiskey in the evening 3 or 4/week. Discussed fatty liver finding on CT. Advised about ceasing alcohol consumption. Begin thiamine, MVI and folic acid    Tobacco use Declined nicotine replacement therapy. Smoking cessation discussed and advised.   DVT prophylaxis: On heparin infusion. Code Status:  Full code. Family Communication:   Disposition Plan:   Patient is from:  Home.  Anticipated DC to:  Home.  Anticipated DC date:  07/29/2020 or 07/30/2020.  Anticipated DC barriers: Clinical status and  consultant sign off.  Consults called:  Discussed with cardiology on-call Dr.Wrobel and and routine consult requested.  Admission status:  Observation/telemetry.  Severity of Illness:  High severity after presenting with dyspnea in the setting of volume overload and newly diagnosed atrial fibrillation with a controlled rate.  The patient will need to remain in the hospital for further evaluation and treatment for the next 24 to 48 hours.  Bobette Mo MD [facility]  How to contact the [facility] Attending or Consulting provider 7A - 7P or covering provider during after hours 7P -7A, for this patient?   Check the care team in [facility] and look for a) attending/consulting [facility] provider listed and b) the [facility] team listed Log into [email or web address] and use [facility] universal password to access. If you do not have the password, please contact the hospital operator. Locate the [facility] provider you are looking for under [facility] and page to a number that you can be directly reached. If you still have difficulty reaching the provider, please page the [facility] (Director on Call) for the Hospitalists listed on amion for assistance.  07/28/2020, 9:33 PM   This document was prepared using Dragon voice recognition software and may contain some unintended transcription errors.

## 2020-07-28 NOTE — ED Notes (Signed)
Pt. Provided PO fluids. 

## 2020-07-28 NOTE — ED Notes (Signed)
RA SAT 87%. Placed on 4LNC. EKG obtained

## 2020-07-28 NOTE — ED Notes (Signed)
Report called to [facility] RN at [facility] [id number]

## 2020-07-28 NOTE — Plan of Care (Signed)
Transfer from [facility] Samuel Barnes is a 70 year old male with pmh of HTN, HLD, depression, and BPH presents with complaints of shortness of breath.  Found to be in new onset atrial fibrillation.  Patient was otherwise noted to be afebrile, but had new oxygen requirement on 4 L nasal cannula oxygen.  Labs significant for lactic acid 2.1, sodium 124, magnesium 1.6, BNP 302.6, high-sensitivity troponins negative x2, and lactic acid 2.1->0.8.  Blood cultures were obtained.  Initial chest x-ray had given concern for right-sided pleural effusion with atelectasis versus pneumonia.  Patient had been started on Rocephin and azithromycin.  CT angiogram of the chest was obtained which showed no signs of PE, moderate right and small left pleural effusion with compressive atelectasis in the right lower lobe, and cardiomegaly.  Patient had been started on a heparin drip and given 2 g of magnesium sulfate.  [facility] called to admit.  Accepted to a cardiac telemetry bed as inpatient.

## 2020-07-29 ENCOUNTER — Inpatient Hospital Stay (HOSPITAL_COMMUNITY): Payer: Medicare Other

## 2020-07-29 DIAGNOSIS — I4891 Unspecified atrial fibrillation: Secondary | ICD-10-CM | POA: Diagnosis not present

## 2020-07-29 DIAGNOSIS — E871 Hypo-osmolality and hyponatremia: Secondary | ICD-10-CM

## 2020-07-29 DIAGNOSIS — I4819 Other persistent atrial fibrillation: Secondary | ICD-10-CM

## 2020-07-29 DIAGNOSIS — E7800 Pure hypercholesterolemia, unspecified: Secondary | ICD-10-CM | POA: Diagnosis not present

## 2020-07-29 DIAGNOSIS — I509 Heart failure, unspecified: Secondary | ICD-10-CM

## 2020-07-29 DIAGNOSIS — K760 Fatty (change of) liver, not elsewhere classified: Secondary | ICD-10-CM | POA: Diagnosis not present

## 2020-07-29 DIAGNOSIS — J9601 Acute respiratory failure with hypoxia: Secondary | ICD-10-CM | POA: Diagnosis not present

## 2020-07-29 DIAGNOSIS — I700 Atherosclerosis of aorta: Secondary | ICD-10-CM | POA: Diagnosis not present

## 2020-07-29 DIAGNOSIS — Z720 Tobacco use: Secondary | ICD-10-CM

## 2020-07-29 DIAGNOSIS — R739 Hyperglycemia, unspecified: Secondary | ICD-10-CM | POA: Diagnosis present

## 2020-07-29 DIAGNOSIS — I2510 Atherosclerotic heart disease of native coronary artery without angina pectoris: Secondary | ICD-10-CM | POA: Diagnosis not present

## 2020-07-29 DIAGNOSIS — I10 Essential (primary) hypertension: Secondary | ICD-10-CM

## 2020-07-29 DIAGNOSIS — I2584 Coronary atherosclerosis due to calcified coronary lesion: Secondary | ICD-10-CM

## 2020-07-29 LAB — CBC
HCT: 42.5 % (ref 39.0–52.0)
Hemoglobin: 14.9 g/dL (ref 13.0–17.0)
MCH: 33 pg (ref 26.0–34.0)
MCHC: 35.1 g/dL (ref 30.0–36.0)
MCV: 94.2 fL (ref 80.0–100.0)
Platelets: 205 10*3/uL (ref 150–400)
RBC: 4.51 MIL/uL (ref 4.22–5.81)
RDW: 12.9 % (ref 11.5–15.5)
WBC: 7.6 10*3/uL (ref 4.0–10.5)
nRBC: 0 % (ref 0.0–0.2)

## 2020-07-29 LAB — BASIC METABOLIC PANEL
Anion gap: 10 (ref 5–15)
Anion gap: 7 (ref 5–15)
Anion gap: 12 (ref 5–15)
BUN: 8 mg/dL (ref 8–23)
BUN: 8 mg/dL (ref 8–23)
BUN: 9 mg/dL (ref 8–23)
CO2: 27 mmol/L (ref 22–32)
CO2: 31 mmol/L (ref 22–32)
CO2: 26 mmol/L (ref 22–32)
Calcium: 8.7 mg/dL — ABNORMAL LOW (ref 8.9–10.3)
Calcium: 8.7 mg/dL — ABNORMAL LOW (ref 8.9–10.3)
Calcium: 8.9 mg/dL (ref 8.9–10.3)
Chloride: 87 mmol/L — ABNORMAL LOW (ref 98–111)
Chloride: 87 mmol/L — ABNORMAL LOW (ref 98–111)
Chloride: 87 mmol/L — ABNORMAL LOW (ref 98–111)
Creatinine, Ser: 0.51 mg/dL — ABNORMAL LOW (ref 0.61–1.24)
Creatinine, Ser: 0.53 mg/dL — ABNORMAL LOW (ref 0.61–1.24)
Creatinine, Ser: 0.48 mg/dL — ABNORMAL LOW (ref 0.61–1.24)
GFR, Estimated: >60 mL/min (ref >60)
GFR, Estimated: >60 mL/min (ref >60)
GFR, Estimated: >60 mL/min (ref >60)
Glucose, Bld: 112 mg/dL — ABNORMAL HIGH (ref 70–99)
Glucose, Bld: 141 mg/dL — ABNORMAL HIGH (ref 70–99)
Glucose, Bld: 106 mg/dL — ABNORMAL HIGH (ref 70–99)
Potassium: 4.9 mmol/L (ref 3.5–5.1)
Potassium: 3.6 mmol/L (ref 3.5–5.1)
Potassium: 3.7 mmol/L (ref 3.5–5.1)
Sodium: 124 mmol/L — ABNORMAL LOW (ref 135–145)
Sodium: 125 mmol/L — ABNORMAL LOW (ref 135–145)
Sodium: 125 mmol/L — ABNORMAL LOW (ref 135–145)

## 2020-07-29 LAB — MAGNESIUM: Magnesium: 1.9 mg/dL (ref 1.7–2.4)

## 2020-07-29 LAB — HEPARIN LEVEL (UNFRACTIONATED)
Heparin Unfractionated: 0.57 IU/mL (ref 0.30–0.70)
Heparin Unfractionated: <0.1 IU/mL — ABNORMAL LOW (ref 0.30–0.70)

## 2020-07-29 LAB — C DIFFICILE QUICK SCREEN W PCR REFLEX
C Diff antigen: NEGATIVE
C Diff interpretation: NOT DETECTED
C Diff toxin: NEGATIVE

## 2020-07-29 MED ORDER — LOSARTAN POTASSIUM 25 MG PO TABS
25.0000 mg | ORAL_TABLET | Freq: Every day | ORAL | Status: DC
  Administered 2020-07-30 – 2020-07-31 (×2): 25 mg via ORAL
  Filled 2020-07-29 (×2): qty 1

## 2020-07-29 MED ORDER — PERFLUTREN LIPID MICROSPHERE
1.0000 mL | INTRAVENOUS | Status: AC | PRN
  Administered 2020-07-29: 3 mL via INTRAVENOUS
  Filled 2020-07-29: qty 10

## 2020-07-29 MED ORDER — IPRATROPIUM-ALBUTEROL 0.5-2.5 (3) MG/3ML IN SOLN
3.0000 mL | Freq: Four times a day (QID) | RESPIRATORY_TRACT | Status: DC | PRN
  Administered 2020-07-29 – 2020-08-09 (×16): 3 mL via RESPIRATORY_TRACT
  Filled 2020-07-29 (×16): qty 3

## 2020-07-29 MED ORDER — HEPARIN BOLUS VIA INFUSION
3000.0000 [IU] | Freq: Once | INTRAVENOUS | Status: AC
  Administered 2020-07-29: 3000 [IU] via INTRAVENOUS
  Filled 2020-07-29: qty 3000

## 2020-07-29 MED ORDER — FUROSEMIDE 10 MG/ML IJ SOLN
40.0000 mg | Freq: Two times a day (BID) | INTRAMUSCULAR | Status: DC
Start: 2020-07-29 — End: 2020-08-03
  Administered 2020-07-29 – 2020-08-03 (×9): 40 mg via INTRAVENOUS
  Filled 2020-07-29 (×9): qty 4

## 2020-07-29 NOTE — Progress Notes (Signed)
PROGRESS NOTE    Seyed Heffley [facility]  GQQ:[id number] DOB: 1950-12-26 DOA: 07/28/2020 PCP: Oneita Hurt, No   Brief Narrative: Samuel Barnes is a 70 y.o. male with a history of aortic atherosclerosis, BPH, class II obesity, coronary artery calcification, depression, hyperlipidemia, hypertension, alcohol and tobacco use. Patient presented secondary to shortness of breath with evidence of acute heart failure, new onset atrial fibrillation and pleural effusions requiring oxygen. Started on IV lasix, IV heparin. Cardiology consulted on admission.   Assessment & Plan:   Principal Problem:   Acute respiratory failure with hypoxia (HCC) Active Problems:   Pericardial effusion   Aortic atherosclerosis (HCC)   Coronary artery calcification   Class 2 obesity   Hyperlipidemia   Tobacco use   BPH (benign prostatic hyperplasia)   Depression   Hypertension   Hyponatremia   Hypomagnesemia   New onset atrial fibrillation (HCC)   Prolonged QT interval   Hypocalcemia   Alcohol abuse   Fatty liver   Hyperglycemia   Acute respiratory failure with hypoxia CT imaging significant for moderate right pleural effusion/small left pleural effusion. Patient has required up to 4 lpm of oxygen. -Wean to room air  Pleural effusions Possibly secondary to heart failure. If no improvement hypoxia with diuresis, will order a thoracentesis.  Acute heart failure No prior history. Troponin of 2>2. Imaging significant for pericardial and pleural effusions. Patient hypervolemic on admission and started on Lasix IV. Cardiology consulted. Transthoracic Echocardiogram ordered and is pending. -Continue Lasix IV -Follow-up Transthoracic Echocardiogram  Paroxysmal atrial fibrillation New onset. Currently rate controlled. CHA2DS2-VASc Score is 4. Patient is on metoprolol tartrate as an outpatient, which was resumed. Patient started on heparin IV on admission. Cardiology consulted as mentioned above. -Continue  heparin IV  Hyponatremia Sodium of 124 on admission. Running diagnosis of hypervolemic hyponatremia. Patient started on fluid/sodium restricted diet in addition to Lasix IV. -Continue Lasix -BMP q6 hours  Primary hypertension Patient is on amlodipine and metoprolol as an outpatient. Amlodipine held on admission. Blood pressure is slightly uncontrolled. -Continue metoprolol  Coronary artery calcification Hyperlipidemia Seen on imaging. Patient is on lovastatin as an outpatient already. -Continue lovastatin (pravastatin while inpatient)  BPH -Continue terazosin  Depression -Continue Zoloft  Hyperglycemia Fasting blood sugar is slightly elevated. No further management at this time.  Hypocalcemia Borderline. Stable.  Hypomagnesemia Given supplementation. Resolved.  Alcohol use Noted. Does not seem to abuse alcohol per history.  Fatty liver Seen on imaging. Likely related to alcohol consumption but patient also has obesity.  Tobacco use Smoking cessation discussed on admission. Patient decline nicotine replacement therapy.   DVT prophylaxis: Heparin IV Code Status:   Code Status: Full Code Family Communication: None at bedside Disposition Plan: Discharge likely home in several days pending improvement of respiratory status, weaning oxygen to room air, improvement of heart failure   Consultants:  Cardiology  Procedures:  None  Antimicrobials: Ceftriaxone Azithromycin    Subjective: No concerns this morning. No chest pain.  Objective: Vitals:   07/28/20 1600 07/28/20 1658 07/28/20 1849 07/28/20 2350  BP: (!) 161/91 (!) 158/89 (!) 159/94 (!) 143/90  Pulse: 85 65 69   Resp: 17 18 18 16   Temp:   (!) 97.5 F (36.4 C) 97.7 F (36.5 C)  TempSrc:    Oral  SpO2: 94% 97% 96% 94%  Weight:      Height:        Intake/Output Summary (Last 24 hours) at 07/29/2020 0755 Last data filed at 07/29/2020 0500 Gross per  24 hour  Intake 318.82 ml  Output 1100 ml  Net  -781.18 ml   Filed Weights   07/28/20 1009  Weight: 104.9 kg    Examination:  General exam: Appears calm and comfortable Respiratory system: Clear to auscultation. Respiratory effort normal. Cardiovascular system: S1 & S2 heard, irregular rhythm, normal rate. No murmurs, rubs, gallops or clicks. Gastrointestinal system: Abdomen is nondistended, soft and nontender. No organomegaly or masses felt. Normal bowel sounds heard. Central nervous system: Alert and oriented. No focal neurological deficits. Musculoskeletal: 2+ BLE edema. No calf tenderness Skin: No cyanosis. No rashes Psychiatry: Judgement and insight appear normal. Mood & affect appropriate.     Data Reviewed: I have personally reviewed following labs and imaging studies  CBC Lab Results  Component Value Date   WBC 7.6 07/29/2020   RBC 4.51 07/29/2020   HGB 14.9 07/29/2020   HCT 42.5 07/29/2020   MCV 94.2 07/29/2020   MCH 33.0 07/29/2020   PLT 205 07/29/2020   MCHC 35.1 07/29/2020   RDW 12.9 07/29/2020   LYMPHSABS 0.5 (L) 07/28/2020   MONOABS 0.9 07/28/2020   EOSABS 0.0 07/28/2020   BASOSABS 0.0 07/28/2020     Last metabolic panel Lab Results  Component Value Date   NA 124 (L) 07/29/2020   K 4.9 07/29/2020   CL 87 (L) 07/29/2020   CO2 27 07/29/2020   BUN 8 07/29/2020   CREATININE 0.51 (L) 07/29/2020   GLUCOSE 112 (H) 07/29/2020   GFRNONAA >60 07/29/2020   CALCIUM 8.7 (L) 07/29/2020   PROT 7.2 07/28/2020   ALBUMIN 4.1 07/28/2020   BILITOT 1.0 07/28/2020   ALKPHOS 102 07/28/2020   AST 19 07/28/2020   ALT 14 07/28/2020   ANIONGAP 10 07/29/2020    CBG (last 3)  No results for input(s): GLUCAP in the last 72 hours.   GFR: Estimated Creatinine Clearance: 99.4 mL/min (A) (by C-G formula based on SCr of 0.51 mg/dL (L)).  Coagulation Profile: No results for input(s): INR, PROTIME in the last 168 hours.  Recent Results (from the past 240 hour(s))  Resp Panel by RT-PCR (Flu A&B, Covid)  Nasopharyngeal Swab     Status: None   Collection Time: 07/28/20 10:51 AM   Specimen: Nasopharyngeal Swab; Nasopharyngeal(NP) swabs in vial transport medium  Result Value Ref Range Status   SARS Coronavirus 2 by RT PCR NEGATIVE NEGATIVE Final    Comment: (NOTE) SARS-CoV-2 target nucleic acids are NOT DETECTED.  The SARS-CoV-2 RNA is generally detectable in upper respiratory specimens during the acute phase of infection. The lowest concentration of SARS-CoV-2 viral copies this assay can detect is 138 copies/mL. A negative result does not preclude SARS-Cov-2 infection and should not be used as the sole basis for treatment or other patient management decisions. A negative result may occur with  improper specimen collection/handling, submission of specimen other than nasopharyngeal swab, presence of viral mutation(s) within the areas targeted by this assay, and inadequate number of viral copies(<138 copies/mL). A negative result must be combined with clinical observations, patient history, and epidemiological information. The expected result is Negative.  Fact Sheet for Patients:  BloggerCourse.com  Fact Sheet for Healthcare Providers:  SeriousBroker.it  This test is no t yet approved or cleared by the [place] FDA and  has been authorized for detection and/or diagnosis of SARS-CoV-2 by FDA under an Emergency Use Authorization (EUA). This EUA will remain  in effect (meaning this test can be used) for the duration of the COVID-19 declaration under Section  564(b)(1) of the Act, 21 U.S.C.section 360bbb-3(b)(1), unless the authorization is terminated  or revoked sooner.       Influenza A by PCR NEGATIVE NEGATIVE Final   Influenza B by PCR NEGATIVE NEGATIVE Final    Comment: (NOTE) The Xpert Xpress SARS-CoV-2/FLU/RSV plus assay is intended as an aid in the diagnosis of influenza from Nasopharyngeal swab specimens and should not be  used as a sole basis for treatment. Nasal washings and aspirates are unacceptable for Xpert Xpress SARS-CoV-2/FLU/RSV testing.  Fact Sheet for Patients: BloggerCourse.comhttps://www.fda.gov/media/152166/download  Fact Sheet for Healthcare Providers: SeriousBroker.ithttps://www.fda.gov/media/152162/download  This test is not yet approved or cleared by the [place] FDA and has been authorized for detection and/or diagnosis of SARS-CoV-2 by FDA under an Emergency Use Authorization (EUA). This EUA will remain in effect (meaning this test can be used) for the duration of the COVID-19 declaration under Section 564(b)(1) of the Act, 21 U.S.C. section 360bbb-3(b)(1), unless the authorization is terminated or revoked.  Performed at [facility] [place], [address] [place], [place] [id number]         Radiology Studies: CT Angio Chest PE W and/or Wo Contrast  Result Date: 07/28/2020 CLINICAL DATA:  Shortness of breath for 3 days.  Elevated D-dimer. EXAM: CT ANGIOGRAPHY CHEST WITH CONTRAST TECHNIQUE: Multidetector CT imaging of the chest was performed using the standard protocol during bolus administration of intravenous contrast. Multiplanar CT image reconstructions and MIPs were obtained to evaluate the vascular anatomy. CONTRAST:  100mL OMNIPAQUE IOHEXOL 350 MG/ML SOLN COMPARISON:  CT chest, abdomen and pelvis 04/18/2017. Single-view of the chest today and PA and lateral chest 03/02/2017. FINDINGS: Cardiovascular: Satisfactory of opacification of the pulmonary arteries to the segmental level. No pulmonary embolus is seen. There is cardiomegaly. Small pericardial effusion is present. No aortic aneurysm. Calcific aortic and coronary atherosclerosis. Mediastinum/Nodes: No enlarged mediastinal, hilar, or axillary lymph nodes. Thyroid gland, trachea, and esophagus demonstrate no significant findings. Lungs/Pleura: Moderate right and very small left pleural effusions are seen. There is compressive atelectasis in the right  lower lobe due to the patient's effusion. No consolidative process is identified. No nodule or mass is seen. Upper Abdomen: The liver is low attenuating consistent with fatty infiltration. A simple cyst measuring 3.4 cm is identified. There is some thickening of the adrenal glands compatible with hyperplasia, unchanged. Left renal cysts are partially imaged. Musculoskeletal: No acute abnormality or focal lesion. Remote healed right rib fractures are noted. Review of the MIP images confirms the above findings. IMPRESSION: Negative for pulmonary embolus. Moderate right and small left pleural effusions with compressive atelectasis in the right lower lobe. Cause for the effusions is not identified. Cardiomegaly. Small pericardial effusion. Calcific coronary artery disease. Fatty infiltration of the liver. Aortic Atherosclerosis (ICD10-I70.0). Electronically Signed   By: Drusilla Kannerhomas  Dalessio M.D.   On: 07/28/2020 13:37   DG Chest Portable 1 View  Result Date: 07/28/2020 CLINICAL DATA:  cough, chills, hypoxia, shortness of breath. new afib EXAM: PORTABLE CHEST 1 VIEW COMPARISON:  04/09/2017 FINDINGS: Mild cardiomegaly. Cardiac silhouette has enlarged compared to the previous study. Atherosclerotic calcification of the aortic knob. Moderate-sized right-sided pleural effusion with hazy right basilar airspace opacity. No left-sided pleural effusion is seen. No pneumothorax. Multiple chronic right-sided rib fractures. IMPRESSION: 1. Moderate right-sided pleural effusion with hazy right basilar airspace opacity, which may reflect atelectasis versus pneumonia. 2. Cardiac silhouette has enlarged compared to the previous study. Underlying pericardial effusion not excluded. Electronically Signed   By: Duanne GuessNicholas  Plundo D.O.  On: 07/28/2020 11:17        Scheduled Meds:  FLUoxetine  20 mg Oral QHS   folic acid  1 mg Oral Daily   furosemide  20 mg Intravenous BID   lisinopril  2.5 mg Oral Daily   metoprolol tartrate  50 mg  Oral BID   montelukast  10 mg Oral Daily   multivitamin with minerals  1 tablet Oral Daily   potassium chloride  20 mEq Oral Daily   pravastatin  20 mg Oral q1800   terazosin  5 mg Oral Daily   thiamine  100 mg Oral Daily   Continuous Infusions:  heparin 1,600 Units/hr (07/29/20 0603)     LOS: 1 day     Jacquelin Hawking, MD [facility] 07/29/2020, 7:55 AM  If 7PM-7AM, please contact night-coverage [email or web address]

## 2020-07-29 NOTE — Consult Note (Signed)
Cardiology Consultation:   Patient ID: Samuel Barnes MRN: [id number]; DOB: 01-Jul-1950  Admit date: 07/28/2020 Date of Consult: 07/29/2020  PCP:  Oneita Hurt, No   [facility] Providers Cardiologist:  New to [facility]; Dr. Joette Catching here to update MD or APP on Care Team, Refresh:1}     Patient Profile:   BABY STAIRS is a 70 y.o. male with a PMH of coronary artery calcifications, HTN, HLD, BPH s/p TURP, depression, and tobacco abuse,  who is being seen 07/29/2020 for the evaluation of new onset atrial fibrillation at the request of Dr. Caleb Popp.  History of Present Illness:   Mr. Enns presented tot he ED 07/28/20 with complaints of SOB for 3 days. For the past several day she reported worsening SOB with associated mild productive cough, worsening fatigue, chills, and lightheadedness. Given progressive symptoms he presented to the ED for further evaluation.   He has no prior cardiac history and has never undergone prior cardiac imaging or ischemic testing, though is noted to have coronary artery calcifications on CT scans. Has previously seen Dr. Reuel Boom with [facility] cardiology but not since 2019.   Hospital course: hypoxic to 87% on arrival with elevated BP, otherwise VSS. Labs notable for NA 124, K 4.3>3.6, Mg 1.6>1.9, Cr 0.5, Hgb 15.8, PLT 209, Lactate 2.1>0.8, BNP 302, HsTrop 2 x2, Ddimer 1.21, TSH wnl, COVID-19/influenza negative. EKG with atrial fibrillation, rate 80 bpm, nonspecific ST-T wave abnormalities without overt STE/D; no comparison. CXR with moderate right pleural effusion and possible PNA. CTA Chest showed no PE, though moderate right and small left pleural effusion, cardiomegaly, small pericardial effusion, and coronary artery calcifications. He was given a dose of IV CTX and azithro in the ED given c/f PNA which was subsequently stopped. He was admitted to medicine and started on a heparin gtt and po metoprolol for new onset Afib and given IV lasix  20mg  BID and lisinopril for possible CHF. An echocardiogram is pending. Cardiology asked to evaluate.   At the time of this evaluation he reports ongoing SOB with little improvement since arrival. He reports feeling like his usual self 4 days ago, then started developing SOB, cough, and fatigue. Symptoms progressively worsened, feeling SOB at rest prompting his ED visit. He has had PND but no orthopnea. He denies LE edema. He is unaware of any palpitations or his heart beating erratically. He denies prior abnormal heart rhythms. He smokes 1ppd which he has for the past 50 years. Denies diagnosis of COPD. He also drinks 3-4 cocktails 3-4 times per week. No prior sleep study.    Past Medical History:  Diagnosis Date   Aortic atherosclerosis (HCC) 07/28/2020   BPH (benign prostatic hyperplasia)    Class 2 obesity 07/28/2020   Coronary artery calcification 07/28/2020   Depression    Hyperlipidemia 07/28/2020   Hypertension    Tobacco use 07/28/2020    Past Surgical History:  Procedure Laterality Date   TRANSURETHRAL RESECTION OF PROSTATE       Home Medications:  Prior to Admission medications   Medication Sig Start Date End Date Taking? Authorizing Provider  amLODipine (NORVASC) 10 MG tablet Take 10 mg by mouth daily.   Yes [provider]  FLUoxetine (PROZAC) 20 MG capsule Take 20 mg by mouth at bedtime.   Yes [provider]  lovastatin (MEVACOR) 20 MG tablet Take 20 mg by mouth daily.   Yes [provider]  montelukast (SINGULAIR) 10 MG tablet Take 10 mg by mouth daily.  Yes [provider]  terazosin (HYTRIN) 5 MG capsule Take 5 mg by mouth daily.   Yes [provider]  metoprolol succinate (TOPROL-XL) 50 MG 24 hr tablet Take 50 mg by mouth daily. 06/20/20   [provider]    Inpatient Medications: Scheduled Meds:  FLUoxetine  20 mg Oral QHS   folic acid  1 mg Oral Daily   furosemide  40 mg Intravenous BID   [START ON 07/30/2020]  losartan  25 mg Oral Daily   metoprolol tartrate  50 mg Oral BID   montelukast  10 mg Oral Daily   multivitamin with minerals  1 tablet Oral Daily   potassium chloride  20 mEq Oral Daily   pravastatin  20 mg Oral q1800   terazosin  5 mg Oral Daily   thiamine  100 mg Oral Daily   Continuous Infusions:  heparin 1,600 Units/hr (07/29/20 0603)   PRN Meds: acetaminophen **OR** acetaminophen, ipratropium-albuterol, prochlorperazine  Allergies:    Allergies  Allergen Reactions   Ciprofloxacin Shortness Of Breath and Other (See Comments)    Numbness in extremities    Gabapentin Other (See Comments)    Mental problems, depression, anger    Social History:   Social History   Socioeconomic History   Marital status: Divorced    Spouse name: Not on file   Number of children: Not on file   Years of education: Not on file   Highest education level: Not on file  Occupational History   Not on file  Tobacco Use   Smoking status: Every Day    Types: Cigarettes   Smokeless tobacco: Never  Vaping Use   Vaping Use: Never used  Substance and Sexual Activity   Alcohol use: Yes    Alcohol/week: 1.0 standard drink    Types: 1 Shots of liquor per week    Comment: 3-4 times a week   Drug use: Never   Sexual activity: Not on file  Other Topics Concern   Not on file  Social History Narrative   Not on file   Social Determinants of Health   Financial Resource Strain: Not on file  Food Insecurity: Not on file  Transportation Needs: Not on file  Physical Activity: Not on file  Stress: Not on file  Social Connections: Not on file  Intimate Partner Violence: Not on file    Family History:    Family History  Problem Relation Age of Onset   Coronary artery disease Mother    Hypertension Mother    Hyperlipidemia Mother    Ovarian cancer Mother    Ovarian cancer Sister    Liver cancer Sister    Liver cancer Brother      ROS:  Please see the history of present illness.   All  other ROS reviewed and negative.     Physical Exam/Data:   Vitals:   07/28/20 1658 07/28/20 1849 07/28/20 2350 07/29/20 1226  BP: (!) 158/89 (!) 159/94 (!) 143/90 127/84  Pulse: 65 69  63  Resp: 18 18 16 18   Temp:  (!) 97.5 F (36.4 C) 97.7 F (36.5 C) 97.8 F (36.6 C)  TempSrc:   Oral Oral  SpO2: 97% 96% 94% 96%  Weight:      Height:        Intake/Output Summary (Last 24 hours) at 07/29/2020 1505 Last data filed at 07/29/2020 1100 Gross per 24 hour  Intake 558.82 ml  Output 1650 ml  Net -1091.18 ml   Last  3 Weights 07/28/2020  Weight (lbs) 231 lb 4.2 oz  Weight (kg) 104.9 kg     Body mass index is 35.16 kg/m.  General:  Obese gentleman laying in bed appearing mildly uncomfortable HEENT: sclera anicteric Neck: no JVD Vascular: No carotid bruits; distal pulses 2+ bilaterally  Cardiac:  normal S1, S2; IRIR; no murmurs, rubs, or gallops Lungs:  wheezing anteriorly, prolonged expiratory phase, decreased breath sounds at lung bases with poor air movement Abd: soft, nontender, no hepatomegaly  Ext: 1-2+ LE edema Musculoskeletal:  No deformities, BUE and BLE strength normal and equal Skin: warm and dry  Neuro:  CNs 2-12 intact, no focal abnormalities noted Psych:  Normal affect   EKG:  The EKG was personally reviewed and demonstrates:  atrial fibrillation, rate 80 bpm, nonspecific ST-T wave abnormalities without overt STE/D; no comparison. Telemetry:  Telemetry was personally reviewed and demonstrates:  atrial fibrillation with CVR, occasional PVCs  Relevant CV Studies: Echocardiogram pending  Laboratory Data:  High Sensitivity Troponin:   Recent Labs  Lab 07/28/20 1051 07/28/20 1249  TROPONINIHS 2 2     Chemistry Recent Labs  Lab 07/28/20 1051 07/29/20 0453 07/29/20 1137  NA 124* 124* 125*  K 4.3 4.9 3.6  CL 84* 87* 87*  CO2 GLUCOSE 130* 112* 141*  BUN CREATININE 0.50* 0.51* 0.53*  CALCIUM 8.7* 8.7* 8.7*  GFRNONAA >60 >60 >60   ANIONGAP Recent Labs  Lab 07/28/20 1051  PROT 7.2  ALBUMIN 4.1  AST 19  ALT 14  ALKPHOS 102  BILITOT 1.0   Hematology Recent Labs  Lab 07/28/20 1051 07/29/20 0453  WBC 8.2 7.6  RBC 4.77 4.51  HGB 15.8 14.9  HCT 44.4 42.5  MCV 93.1 94.2  MCH 33.1 33.0  MCHC 35.6 35.1  RDW 12.8 12.9  PLT 209 205   BNP Recent Labs  Lab 07/28/20 1051  BNP 302.6*    DDimer  Recent Labs  Lab 07/28/20 1051  DDIMER 1.21*     Radiology/Studies:  CT Angio Chest PE W and/or Wo Contrast  Result Date: 07/28/2020 CLINICAL DATA:  Shortness of breath for 3 days.  Elevated D-dimer. EXAM: CT ANGIOGRAPHY CHEST WITH CONTRAST TECHNIQUE: Multidetector CT imaging of the chest was performed using the standard protocol during bolus administration of intravenous contrast. Multiplanar CT image reconstructions and MIPs were obtained to evaluate the vascular anatomy. CONTRAST:  OMNIPAQUE IOHEXOL 350 MG/ML SOLN COMPARISON:  CT chest, abdomen and pelvis 04/18/2017. Single-view of the chest today and PA and lateral chest 03/02/2017. FINDINGS: Cardiovascular: Satisfactory of opacification of the pulmonary arteries to the segmental level. No pulmonary embolus is seen. There is cardiomegaly. Small pericardial effusion is present. No aortic aneurysm. Calcific aortic and coronary atherosclerosis. Mediastinum/Nodes: No enlarged mediastinal, hilar, or axillary lymph nodes. Thyroid gland, trachea, and esophagus demonstrate no significant findings. Lungs/Pleura: Moderate right and very small left pleural effusions are seen. There is compressive atelectasis in the right lower lobe due to the patient's effusion. No consolidative process is identified. No nodule or mass is seen. Upper Abdomen: The liver is low attenuating consistent with fatty infiltration. A simple cyst measuring 3.4 cm is identified. There is some thickening of the adrenal glands compatible with hyperplasia, unchanged. Left renal cysts are  partially imaged. Musculoskeletal: No acute abnormality or focal lesion. Remote healed right rib fractures are noted. Review of the MIP images confirms the above findings. IMPRESSION: Negative for pulmonary  embolus. Moderate right and small left pleural effusions with compressive atelectasis in the right lower lobe. Cause for the effusions is not identified. Cardiomegaly. Small pericardial effusion. Calcific coronary artery disease. Fatty infiltration of the liver. Aortic Atherosclerosis (ICD10-I70.0). Electronically Signed   By: Drusilla Kanner M.D.   On: 07/28/2020 13:37   DG Chest Portable 1 View  Result Date: 07/28/2020 CLINICAL DATA:  cough, chills, hypoxia, shortness of breath. new afib EXAM: PORTABLE CHEST 1 VIEW COMPARISON:  04/09/2017 FINDINGS: Mild cardiomegaly. Cardiac silhouette has enlarged compared to the previous study. Atherosclerotic calcification of the aortic knob. Moderate-sized right-sided pleural effusion with hazy right basilar airspace opacity. No left-sided pleural effusion is seen. No pneumothorax. Multiple chronic right-sided rib fractures. IMPRESSION: 1. Moderate right-sided pleural effusion with hazy right basilar airspace opacity, which may reflect atelectasis versus pneumonia. 2. Cardiac silhouette has enlarged compared to the previous study. Underlying pericardial effusion not excluded. Electronically Signed   By: Duanne Guess D.O.   On: 07/28/2020 11:17     Assessment and Plan:   1. Acute hypoxic respiratory failure likely 2/2 acute CHF: patient presented with several days of worsening SOB, cough, fatigue. He was hypoxic to 87% on arrival which improved with O2 via [place]. EKG with non-specific ST-T wave abnormalities. BNP 302. HsTrop negative x2. Found to have mild-moderate pleural effusions and mild pericardial effusion on CT but negative for PE. He has wheezing and poor air movement on exam. Echo is pending. Suspect his EF may be down. Also high suspicion for underlying  COPD +/- OSA which may be contributing to his presentation.  - Will increase lasix to 40mg  BID - Will give prn duonebs - RN to give first dose now given wheezing on exam - Await echocardiogram to evaluate LV function - need for further work-up/medication changes will be made pending echo results - Continue metoprolol tartrate - Will transition from lisinopril to losartan starting tomorrow - Continue to monitor strict I&Os and daily weights - Continue to monitor electrolytes and replete as needed to maintain K >4, Mg >2  2. New onset atrial fibrillation: no prior history. EKG with rate controlled atrial fibrillation. TSH is wnl. He was started on metoprolol tartrate 50mg  BID (on succinate 50mg  daily at home) for rate control and a heparin gtt for stroke ppx. - CHA2DS2-VASc Score = 4 [CHF History: Yes, HTN History: Yes, Diabetes History: No, Stroke History: No, Vascular Disease History: Yes, Age Score: 1, Gender Score: 0].  Therefore, the patient's annual risk of stroke is 4.8 %.    - Echo pending - Continue heparin gtt for now - anticipate transition to eliquis 5mg  BID prior to discharge - Consider outpatient sleep study to r/o OSA  3. Coronary artery calcifications: noted on prior CT scans. Has not had any ischemic testing. Risk factors for obstructive CAD include HTN, HLD, and tobacco abuse. In light of #1 and 2, anticipate he will undergo ischemic testing this admission.  - Echocardiogram pending - will finalize ischemic testing pending results - NST vs LHC. Will keep NPO after MN in the event NST is pursued tomorrow - Continue statin - Continue aggressive risk factor modifications   4. HTN: BP generally elevated. Home amlodipine on hold and he was started on metoprolol tartrate 50mg  BID (succinate 50mg  daily at home) and lisinopril 2.5mg  daily - Continue metoprolol - Will transition to losartan as above - Continue to hold amlodipine   5. HLD: LDL 46 09/2019 on lovastatin at home and  pravastatin this admission (  hospital formulary) - Will check FLP in AM  - Continue lovastatin for now - low threshold to transition to high-intensity statin pending above work-up  6. Hyponatremia: Na 124 on admission. Likely hypervolemic hyponatremia.  - Continue to monitor closely with diuresis as above   7. Tobacco abuse: smokes 1ppd which he has for the past 50 years. Cessation advised. Suspicions high for underlying COPD - Continue to encourage cessation  8. ETOH abuse: drinks 3-4 cocktails 3-4 days per week. Discussed relationship between ETOH abuse and Afib. - Continue to encourage cessation    Risk Assessment/Risk Scores:   New York Heart Association (NYHA) Functional Class NYHA Class III  CHA2DS2-VASc Score = 4  This indicates a 4.8% annual risk of stroke. The patient's score is based upon: CHF History: Yes HTN History: Yes Diabetes History: No Stroke History: No Vascular Disease History: Yes Age Score: 1 Gender Score: 0       For questions or updates, please contact [facility] Please consult [email or web address] for contact info under    Signed, Beatriz Stallion, PA-C  07/29/2020 3:05 PM

## 2020-07-29 NOTE — Progress Notes (Signed)
  Echocardiogram 2D Echocardiogram has been performed.  Gerda Diss 07/29/2020, 5:52 PM

## 2020-07-29 NOTE — Progress Notes (Addendum)
ANTICOAGULATION CONSULT NOTE - Follow Up Consult  Pharmacy Consult for Heparin Indication: atrial fibrillation  Not on File  Patient Measurements: Height: 5\' 8"  (172.7 cm) Weight: 104.9 kg (231 lb 4.2 oz) IBW/kg (Calculated) : 68.4 Heparin Dosing Weight: 91.3 kg  Vital Signs: Temp: 97.7 F (36.5 C) (07/22 2350) Temp Source: Oral (07/22 2350) BP: 143/90 (07/22 2350)  Labs: Recent Labs    07/28/20 1051 07/28/20 1249 07/28/20 2330 07/29/20 0453 07/29/20 0803  HGB 15.8  --   --  14.9  --   HCT 44.4  --   --  42.5  --   PLT 209  --   --  205  --   HEPARINUNFRC  --   --  <0.10*  --  0.57  CREATININE 0.50*  --   --  0.51*  --   TROPONINIHS 2 2  --   --   --     Estimated Creatinine Clearance: 99.4 mL/min (A) (by C-G formula based on SCr of 0.51 mg/dL (L)).  Assessment: 32 YOM presented with fatigue, SOB, with new onset afib. He is not on AC at home, CBC stable, Hgb 14.9, plts 205. No signs or symptoms of bleeding noted. Heparin level therapeutic at 0.57 on 7/23 at 0803.  Goal of Therapy:  Heparin level 0.3-0.7 units/ml Monitor platelets by anticoagulation protocol: Yes   Plan:  Continue heparin 1600 units/hr. Monitor H/H, signs and symptoms of bleeding, and platelets. Daily HL and CBC.   8/23, PharmD PGY1 Pharmacy Resident  Please check AMION for all [facility] pharmacy phone numbers After 10:00 PM call main pharmacy [phone]

## 2020-07-29 NOTE — Progress Notes (Signed)
ANTICOAGULATION CONSULT NOTE   Pharmacy Consult for heparin Indication: atrial fibrillation   Labs: Recent Labs    07/28/20 1051 07/28/20 1249 07/28/20 2330  HGB 15.8  --   --   HCT 44.4  --   --   PLT 209  --   --   HEPARINUNFRC  --   --  <0.10*  CREATININE 0.50*  --   --   TROPONINIHS 2 2  --      Assessment: 30 YOM presenting with fatigue, SOB, with new onset afib.  He is not on anticoagulation PTA, CBC wnl. Initial heparin level <0.1 units/ml.  No issues noted with infusion  Goal of Therapy:  Heparin level 0.3-0.7 units/ml Monitor platelets by anticoagulation protocol: Yes   Plan:  Heparin 3000 units IV x 1 and increase drip to 1600 units/hr F/u 6-8 hour heparin level F/u cards eval and long term AC plan Thanks for allowing pharmacy to be a part of this patient's care.  Talbert Cage, PharmD Clinical Pharmacist  07/29/2020 1:09 AM

## 2020-07-29 NOTE — Progress Notes (Signed)
  Echocardiogram 2D Echocardiogram has been performed.  Samuel Barnes 07/29/2020, 5:47 PM

## 2020-07-30 ENCOUNTER — Inpatient Hospital Stay (HOSPITAL_COMMUNITY): Payer: Medicare Other

## 2020-07-30 DIAGNOSIS — I509 Heart failure, unspecified: Secondary | ICD-10-CM | POA: Diagnosis not present

## 2020-07-30 DIAGNOSIS — K760 Fatty (change of) liver, not elsewhere classified: Secondary | ICD-10-CM | POA: Diagnosis not present

## 2020-07-30 DIAGNOSIS — I2510 Atherosclerotic heart disease of native coronary artery without angina pectoris: Secondary | ICD-10-CM | POA: Diagnosis not present

## 2020-07-30 DIAGNOSIS — J9601 Acute respiratory failure with hypoxia: Secondary | ICD-10-CM | POA: Diagnosis not present

## 2020-07-30 DIAGNOSIS — I4891 Unspecified atrial fibrillation: Secondary | ICD-10-CM | POA: Diagnosis not present

## 2020-07-30 DIAGNOSIS — I5031 Acute diastolic (congestive) heart failure: Secondary | ICD-10-CM

## 2020-07-30 DIAGNOSIS — I700 Atherosclerosis of aorta: Secondary | ICD-10-CM | POA: Diagnosis not present

## 2020-07-30 LAB — ECHOCARDIOGRAM COMPLETE
AR max vel: 2.13 cm2
AV Area VTI: 2.22 cm2
AV Area mean vel: 2.22 cm2
AV Mean grad: 2 mmHg
AV Peak grad: 3.7 mmHg
Ao pk vel: 0.96 m/s
Area-P 1/2: 3.53 cm2
Height: 68 in
S' Lateral: 4 cm
Weight: 3700.2 oz

## 2020-07-30 LAB — CBC
HCT: 42.2 % (ref 39.0–52.0)
Hemoglobin: 14.8 g/dL (ref 13.0–17.0)
MCH: 32.9 pg (ref 26.0–34.0)
MCHC: 35.1 g/dL (ref 30.0–36.0)
MCV: 93.8 fL (ref 80.0–100.0)
Platelets: 176 10*3/uL (ref 150–400)
RBC: 4.5 MIL/uL (ref 4.22–5.81)
RDW: 13.1 % (ref 11.5–15.5)
WBC: 7.2 10*3/uL (ref 4.0–10.5)
nRBC: 0 % (ref 0.0–0.2)

## 2020-07-30 LAB — BASIC METABOLIC PANEL
Anion gap: 10 (ref 5–15)
Anion gap: 11 (ref 5–15)
Anion gap: 8 (ref 5–15)
BUN: 9 mg/dL (ref 8–23)
BUN: 9 mg/dL (ref 8–23)
BUN: 9 mg/dL (ref 8–23)
CO2: 31 mmol/L (ref 22–32)
CO2: 28 mmol/L (ref 22–32)
CO2: 31 mmol/L (ref 22–32)
Calcium: 8.9 mg/dL (ref 8.9–10.3)
Calcium: 8.8 mg/dL — ABNORMAL LOW (ref 8.9–10.3)
Calcium: 8.9 mg/dL (ref 8.9–10.3)
Chloride: 87 mmol/L — ABNORMAL LOW (ref 98–111)
Chloride: 87 mmol/L — ABNORMAL LOW (ref 98–111)
Chloride: 88 mmol/L — ABNORMAL LOW (ref 98–111)
Creatinine, Ser: 0.6 mg/dL — ABNORMAL LOW (ref 0.61–1.24)
Creatinine, Ser: 0.49 mg/dL — ABNORMAL LOW (ref 0.61–1.24)
Creatinine, Ser: 0.59 mg/dL — ABNORMAL LOW (ref 0.61–1.24)
GFR, Estimated: >60 mL/min (ref >60)
GFR, Estimated: >60 mL/min (ref >60)
GFR, Estimated: >60 mL/min (ref >60)
Glucose, Bld: 112 mg/dL — ABNORMAL HIGH (ref 70–99)
Glucose, Bld: 102 mg/dL — ABNORMAL HIGH (ref 70–99)
Glucose, Bld: 143 mg/dL — ABNORMAL HIGH (ref 70–99)
Potassium: 3.4 mmol/L — ABNORMAL LOW (ref 3.5–5.1)
Potassium: 3.8 mmol/L (ref 3.5–5.1)
Potassium: 3.3 mmol/L — ABNORMAL LOW (ref 3.5–5.1)
Sodium: 128 mmol/L — ABNORMAL LOW (ref 135–145)
Sodium: 126 mmol/L — ABNORMAL LOW (ref 135–145)
Sodium: 127 mmol/L — ABNORMAL LOW (ref 135–145)

## 2020-07-30 LAB — NM MYOCAR MULTI W/SPECT W/WALL MOTION / EF
Estimated workload: 1 METS
Exercise duration (min): 0 min
Exercise duration (sec): 0 s
MPHR: 149 {beats}/min
Peak HR: 88 {beats}/min
Percent HR: 59 %
Rest HR: 71 {beats}/min

## 2020-07-30 LAB — OSMOLALITY: Osmolality: 269 mOsm/kg — ABNORMAL LOW (ref 275–295)

## 2020-07-30 LAB — MAGNESIUM: Magnesium: 1.7 mg/dL (ref 1.7–2.4)

## 2020-07-30 LAB — LIPID PANEL
Cholesterol: 73 mg/dL (ref 0–200)
HDL: 34 mg/dL — ABNORMAL LOW (ref >40)
LDL Cholesterol: 33 mg/dL (ref 0–99)
Total CHOL/HDL Ratio: 2.1 RATIO
Triglycerides: 29 mg/dL (ref <150)
VLDL: 6 mg/dL (ref 0–40)

## 2020-07-30 LAB — HEPARIN LEVEL (UNFRACTIONATED): Heparin Unfractionated: 0.31 IU/mL (ref 0.30–0.70)

## 2020-07-30 LAB — CREATININE, URINE, RANDOM: Creatinine, Urine: 60.13 mg/dL

## 2020-07-30 LAB — SODIUM, URINE, RANDOM: Sodium, Ur: 91 mmol/L

## 2020-07-30 LAB — OSMOLALITY, URINE: Osmolality, Ur: 386 mOsm/kg (ref 300–900)

## 2020-07-30 MED ORDER — TECHNETIUM TC 99M TETROFOSMIN IV KIT
10.7000 | PACK | Freq: Once | INTRAVENOUS | Status: AC | PRN
  Administered 2020-07-30: 10.7 via INTRAVENOUS

## 2020-07-30 MED ORDER — REGADENOSON 0.4 MG/5ML IV SOLN
0.4000 mg | Freq: Once | INTRAVENOUS | Status: AC
  Filled 2020-07-30: qty 5

## 2020-07-30 MED ORDER — POTASSIUM CHLORIDE CRYS ER 20 MEQ PO TBCR
40.0000 meq | EXTENDED_RELEASE_TABLET | Freq: Once | ORAL | Status: AC
  Administered 2020-07-30: 40 meq via ORAL
  Filled 2020-07-30: qty 2

## 2020-07-30 MED ORDER — REGADENOSON 0.4 MG/5ML IV SOLN
INTRAVENOUS | Status: AC
  Administered 2020-07-30: 0.4 mg via INTRAVENOUS
  Filled 2020-07-30: qty 5

## 2020-07-30 MED ORDER — TECHNETIUM TC 99M TETROFOSMIN IV KIT
31.0000 | PACK | Freq: Once | INTRAVENOUS | Status: AC | PRN
  Administered 2020-07-30: 31 via INTRAVENOUS

## 2020-07-30 NOTE — Progress Notes (Signed)
ANTICOAGULATION CONSULT NOTE - Follow Up Consult  Pharmacy Consult for Heparin Indication: atrial fibrillation  Allergies  Allergen Reactions   Ciprofloxacin Shortness Of Breath and Other (See Comments)    Numbness in extremities    Gabapentin Other (See Comments)    Mental problems, depression, anger    Patient Measurements: Height: 5\' 8"  (172.7 cm) Weight: 100.5 kg (221 lb 9 oz) IBW/kg (Calculated) : 68.4 Heparin Dosing Weight: 91.3 kg  Vital Signs: Temp: 98 F (36.7 C) (07/24 0515) Temp Source: Oral (07/24 0515) BP: 138/81 (07/24 0515) Pulse Rate: 70 (07/24 0515)  Labs: Recent Labs    07/28/20 1051 07/28/20 1249 07/28/20 2330 07/29/20 0453 07/29/20 0803 07/29/20 1137 07/29/20 1651 07/29/20 2309 07/30/20 0555  HGB 15.8  --   --  14.9  --   --   --   --  14.8  HCT 44.4  --   --  42.5  --   --   --   --  42.2  PLT 209  --   --  205  --   --   --   --  176  HEPARINUNFRC  --   --  <0.10*  --  0.57  --   --   --  0.31  CREATININE 0.50*  --   --  0.51*  --    < > 0.48* 0.60* 0.49*  TROPONINIHS 2 2  --   --   --   --   --   --   --    < > = values in this interval not displayed.    Estimated Creatinine Clearance: 97.3 mL/min (A) (by C-G formula based on SCr of 0.49 mg/dL (L)).  Assessment: 34 YOM presented with fatigue, SOB, with new onset afib. He is not on AC at home, CBC stable, Hgb 14.8, plts 176. No signs or symptoms of bleeding noted. Heparin level therapeutic at 0.31 on 7/24 at 0555.  Goal of Therapy:  Heparin level 0.3-0.7 units/ml Monitor platelets by anticoagulation protocol: Yes   Plan:  Increase heparin IV to 1650 units/hr Monitor H/H, signs and symptoms of bleeding, and platelets Daily HL and CBC   8/24, PharmD PGY1 Pharmacy Resident  Please check AMION for all [facility] pharmacy phone numbers After 10:00 PM call main pharmacy [phone]

## 2020-07-30 NOTE — Plan of Care (Signed)
  Problem: Education: Goal: Ability to demonstrate management of disease process will improve Outcome: Progressing   Problem: Activity: Goal: Capacity to carry out activities will improve Outcome: Progressing   Problem: Cardiac: Goal: Ability to achieve and maintain adequate cardiopulmonary perfusion will improve Outcome: Progressing   Problem: Education: Goal: Knowledge of disease or condition will improve Outcome: Progressing Goal: Understanding of medication regimen will improve Outcome: Progressing Goal: Individualized Educational Video(s) Outcome: Progressing   Problem: Activity: Goal: Ability to tolerate increased activity will improve Outcome: Progressing   Problem: Cardiac: Goal: Ability to achieve and maintain adequate cardiopulmonary perfusion will improve Outcome: Progressing   Problem: Health Behavior/Discharge Planning: Goal: Ability to safely manage health-related needs after discharge will improve Outcome: Progressing   Problem: Education: Goal: Knowledge of General Education information will improve Description: Including pain rating scale, medication(s)/side effects and non-pharmacologic comfort measures Outcome: Progressing   Problem: Health Behavior/Discharge Planning: Goal: Ability to manage health-related needs will improve Outcome: Progressing   Problem: Clinical Measurements: Goal: Ability to maintain clinical measurements within normal limits will improve Outcome: Progressing Goal: Will remain free from infection Outcome: Progressing Goal: Diagnostic test results will improve Outcome: Progressing Goal: Respiratory complications will improve Outcome: Progressing Goal: Cardiovascular complication will be avoided Outcome: Progressing   Problem: Activity: Goal: Risk for activity intolerance will decrease Outcome: Progressing   Problem: Nutrition: Goal: Adequate nutrition will be maintained Outcome: Progressing   Problem: Coping: Goal:  Level of anxiety will decrease Outcome: Progressing   Problem: Elimination: Goal: Will not experience complications related to bowel motility Outcome: Progressing Goal: Will not experience complications related to urinary retention Outcome: Progressing   Problem: Pain Managment: Goal: General experience of comfort will improve Outcome: Progressing   Problem: Safety: Goal: Ability to remain free from injury will improve Outcome: Progressing   Problem: Skin Integrity: Goal: Risk for impaired skin integrity will decrease Outcome: Progressing

## 2020-07-30 NOTE — Progress Notes (Signed)
Progress Note  Patient Name: Samuel Barnes Date of Encounter: 07/30/2020  [facility] Cardiologist: Armanda Magic, MD   Subjective   Denies any chest pain or SOB.  2D echo with normal LVF.  No PE on chest CTA.  NPO for nuclear stress test today  Inpatient Medications    Scheduled Meds:  FLUoxetine  20 mg Oral QHS   folic acid  1 mg Oral Daily   furosemide  40 mg Intravenous BID   losartan  25 mg Oral Daily   metoprolol tartrate  50 mg Oral BID   montelukast  10 mg Oral Daily   multivitamin with minerals  1 tablet Oral Daily   potassium chloride  20 mEq Oral Daily   pravastatin  20 mg Oral q1800   terazosin  5 mg Oral Daily   thiamine  100 mg Oral Daily   Continuous Infusions:  heparin 1,650 Units/hr (07/30/20 0931)   PRN Meds: acetaminophen **OR** acetaminophen, ipratropium-albuterol, prochlorperazine   Vital Signs    Vitals:   07/29/20 1551 07/29/20 1835 07/29/20 2205 07/30/20 0515  BP:   122/75 138/81  Pulse:   62 70  Resp: 17     Temp:   97.7 F (36.5 C) 98 F (36.7 C)  TempSrc:   Oral Oral  SpO2:   92%   Weight:  99.5 kg  100.5 kg  Height:        Intake/Output Summary (Last 24 hours) at 07/30/2020 0956 Last data filed at 07/30/2020 0516 Gross per 24 hour  Intake 352 ml  Output 1450 ml  Net -1098 ml   Last 3 Weights 07/30/2020 07/29/2020 07/28/2020  Weight (lbs) 221 lb 9 oz 219 lb 4.8 oz 231 lb 4.2 oz  Weight (kg) 100.5 kg 99.474 kg 104.9 kg      Telemetry    NSR - Personally Reviewed  ECG    No new EKG to review - Personally Reviewed  Physical Exam   GEN: Well nourished, well developed in no acute distress HEENT: Normal NECK: No JVD; No carotid bruits LYMPHATICS: No lymphadenopathy CARDIAC:RRR, no murmurs, rubs, gallops RESPIRATORY:  Clear to auscultation without rales, wheezing or rhonchi  ABDOMEN: Soft, non-tender, non-distended MUSCULOSKELETAL:  No edema; No deformity  SKIN: Warm and dry NEUROLOGIC:  Alert and oriented x  3 PSYCHIATRIC:  Normal affect   Labs    High Sensitivity Troponin:   Recent Labs  Lab 07/28/20 1051 07/28/20 1249  TROPONINIHS 2 2      Chemistry Recent Labs  Lab 07/28/20 1051 07/29/20 0453 07/29/20 1651 07/29/20 2309 07/30/20 0555  NA 124*   < > 125* 128* 126*  K 4.3   < > 3.7 3.4* 3.8  CL 84*   < > 87* 87* 87*  CO2 30   < > 26 31 28   GLUCOSE 130*   < > 106* 112* 102*  BUN 10   < > 9 9 9   CREATININE 0.50*   < > 0.48* 0.60* 0.49*  CALCIUM 8.7*   < > 8.9 8.9 8.8*  PROT 7.2  --   --   --   --   ALBUMIN 4.1  --   --   --   --   AST 19  --   --   --   --   ALT 14  --   --   --   --   ALKPHOS 102  --   --   --   --  BILITOT 1.0  --   --   --   --   GFRNONAA >60   < > >60 >60 >60  ANIONGAP 10   < > 12 10 11    < > = values in this interval not displayed.     Hematology Recent Labs  Lab 07/28/20 1051 07/29/20 0453 07/30/20 0555  WBC 8.2 7.6 7.2  RBC 4.77 4.51 4.50  HGB 15.8 14.9 14.8  HCT 44.4 42.5 42.2  MCV 93.1 94.2 93.8  MCH 33.1 33.0 32.9  MCHC 35.6 35.1 35.1  RDW 12.8 12.9 13.1  PLT 209 205 176    BNP Recent Labs  Lab 07/28/20 1051  BNP 302.6*     DDimer  Recent Labs  Lab 07/28/20 1051  DDIMER 1.21*    CHA2DS2-VASc Score = 4  This indicates a 4.8% annual risk of stroke. The patient's score is based upon: CHF History: Yes HTN History: Yes Diabetes History: No Stroke History: No Vascular Disease History: Yes Age Score: 1 Gender Score: 0   Radiology    CT Angio Chest PE W and/or Wo Contrast  Result Date: 07/28/2020 CLINICAL DATA:  Shortness of breath for 3 days.  Elevated D-dimer. EXAM: CT ANGIOGRAPHY CHEST WITH CONTRAST TECHNIQUE: Multidetector CT imaging of the chest was performed using the standard protocol during bolus administration of intravenous contrast. Multiplanar CT image reconstructions and MIPs were obtained to evaluate the vascular anatomy. CONTRAST:  07/30/2020 OMNIPAQUE IOHEXOL 350 MG/ML SOLN COMPARISON:  CT chest, abdomen  and pelvis 04/18/2017. Single-view of the chest today and PA and lateral chest 03/02/2017. FINDINGS: Cardiovascular: Satisfactory of opacification of the pulmonary arteries to the segmental level. No pulmonary embolus is seen. There is cardiomegaly. Small pericardial effusion is present. No aortic aneurysm. Calcific aortic and coronary atherosclerosis. Mediastinum/Nodes: No enlarged mediastinal, hilar, or axillary lymph nodes. Thyroid gland, trachea, and esophagus demonstrate no significant findings. Lungs/Pleura: Moderate right and very small left pleural effusions are seen. There is compressive atelectasis in the right lower lobe due to the patient's effusion. No consolidative process is identified. No nodule or mass is seen. Upper Abdomen: The liver is low attenuating consistent with fatty infiltration. A simple cyst measuring 3.4 cm is identified. There is some thickening of the adrenal glands compatible with hyperplasia, unchanged. Left renal cysts are partially imaged. Musculoskeletal: No acute abnormality or focal lesion. Remote healed right rib fractures are noted. Review of the MIP images confirms the above findings. IMPRESSION: Negative for pulmonary embolus. Moderate right and small left pleural effusions with compressive atelectasis in the right lower lobe. Cause for the effusions is not identified. Cardiomegaly. Small pericardial effusion. Calcific coronary artery disease. Fatty infiltration of the liver. Aortic Atherosclerosis (ICD10-I70.0). Electronically Signed   By: 03/04/2017 M.D.   On: 07/28/2020 13:37   DG Chest Portable 1 View  Result Date: 07/28/2020 CLINICAL DATA:  cough, chills, hypoxia, shortness of breath. new afib EXAM: PORTABLE CHEST 1 VIEW COMPARISON:  04/09/2017 FINDINGS: Mild cardiomegaly. Cardiac silhouette has enlarged compared to the previous study. Atherosclerotic calcification of the aortic knob. Moderate-sized right-sided pleural effusion with hazy right basilar  airspace opacity. No left-sided pleural effusion is seen. No pneumothorax. Multiple chronic right-sided rib fractures. IMPRESSION: 1. Moderate right-sided pleural effusion with hazy right basilar airspace opacity, which may reflect atelectasis versus pneumonia. 2. Cardiac silhouette has enlarged compared to the previous study. Underlying pericardial effusion not excluded. Electronically Signed   By: 06/09/2017 D.O.   On: 07/28/2020 11:17   ECHOCARDIOGRAM  COMPLETE  Result Date: 07/30/2020    ECHOCARDIOGRAM REPORT   Patient Name:   Jake ChurchHUGHES P Park Date of Exam: 07/29/2020 Medical Rec #:  [id number]             Height:       68.0 in Accession #:    [id number]            Weight:       231.3 lb Date of Birth:  [birth year]             BSA:          2.174 m Patient Age:    70 years              BP:           139/80 mmHg Patient Gender: M                     HR:           68 bpm. Exam Location:  Inpatient Procedure: 2D Echo, Cardiac Doppler, Color Doppler and Intracardiac            Opacification Agent Indications:    CHF                 Atrial fibrillation  History:        Patient has no prior history of Echocardiogram examinations.                 CAD; Risk Factors:Dyslipidemia, Hypertension and Current Smoker.                 ETOH use.  Sonographer:    Ross LudwigArthur Guy RDCS (AE) Referring Phys: [id number] DAVID Kelby FamMANUEL ORTIZ  Sonographer Comments: Patient is morbidly obese and Technically difficult study due to poor echo windows. Image acquisition challenging due to patient body habitus. IMPRESSIONS  1. Left ventricular ejection fraction, by estimation, is 55 to 60%. The left ventricle has normal function. The left ventricle has no regional wall motion abnormalities. Left ventricular diastolic function could not be evaluated.  2. Prominent moderator band of no clinical significance. Right ventricular systolic function is mildly reduced. The right ventricular size is normal. There is mildly elevated pulmonary artery  systolic pressure. The estimated right ventricular systolic pressure is 36.5 mmHg.  3. The mitral valve is normal in structure. No evidence of mitral valve regurgitation. No evidence of mitral stenosis.  4. The aortic valve is normal in structure. Aortic valve regurgitation is not visualized. No aortic stenosis is present.  5. The inferior vena cava is dilated in size with <50% respiratory variability, suggesting right atrial pressure of 15 mmHg. FINDINGS  Left Ventricle: Left ventricular ejection fraction, by estimation, is 55 to 60%. The left ventricle has normal function. The left ventricle has no regional wall motion abnormalities. Definity contrast agent was given IV to delineate the left ventricular  endocardial borders. The left ventricular internal cavity size was normal in size. There is no left ventricular hypertrophy. Left ventricular diastolic function could not be evaluated due to atrial fibrillation. Left ventricular diastolic function could  not be evaluated. Normal left ventricular filling pressure. Right Ventricle: Prominent moderator band of no clinical significance. The right ventricular size is normal. No increase in right ventricular wall thickness. Right ventricular systolic function is mildly reduced. There is mildly elevated pulmonary artery  systolic pressure. The tricuspid regurgitant velocity is 2.32 m/s, and with an assumed right atrial pressure of 15 mmHg, the estimated right ventricular  systolic pressure is 36.5 mmHg. Left Atrium: Left atrial size was normal in size. Right Atrium: Right atrial size was normal in size. Pericardium: There is no evidence of pericardial effusion. Mitral Valve: The mitral valve is normal in structure. No evidence of mitral valve regurgitation. No evidence of mitral valve stenosis. Tricuspid Valve: The tricuspid valve is normal in structure. Tricuspid valve regurgitation is not demonstrated. No evidence of tricuspid stenosis. Aortic Valve: The aortic valve is  normal in structure. Aortic valve regurgitation is not visualized. No aortic stenosis is present. Aortic valve mean gradient measures 2.0 mmHg. Aortic valve peak gradient measures 3.7 mmHg. Aortic valve area, by VTI measures 2.22 cm. Pulmonic Valve: The pulmonic valve was normal in structure. Pulmonic valve regurgitation is not visualized. No evidence of pulmonic stenosis. Aorta: The aortic root is normal in size and structure. Venous: The inferior vena cava is dilated in size with less than 50% respiratory variability, suggesting right atrial pressure of 15 mmHg. IAS/Shunts: No atrial level shunt detected by color flow Doppler.  LEFT VENTRICLE PLAX 2D LVIDd:         4.40 cm  Diastology LVIDs:         4.00 cm  LV e' medial:    11.40 cm/s LV PW:         1.20 cm  LV E/e' medial:  8.2 LV IVS:        0.80 cm  LV e' lateral:   11.30 cm/s LVOT diam:     2.00 cm  LV E/e' lateral: 8.3 LV SV:         45 LV SV Index:   21 LVOT Area:     3.14 cm  RIGHT VENTRICLE          IVC RV Basal diam:  3.40 cm  IVC diam: 3.20 cm LEFT ATRIUM             Index       RIGHT ATRIUM           Index LA diam:        5.30 cm 2.44 cm/m  RA Area:     24.00 cm LA Vol (A2C):   89.3 ml 41.08 ml/m RA Volume:   68.80 ml  31.65 ml/m LA Vol (A4C):   63.1 ml 29.03 ml/m LA Biplane Vol: 77.9 ml 35.83 ml/m  AORTIC VALVE AV Area (Vmax):    2.13 cm AV Area (Vmean):   2.22 cm AV Area (VTI):     2.22 cm AV Vmax:           96.40 cm/s AV Vmean:          62.000 cm/s AV VTI:            0.202 m AV Peak Grad:      3.7 mmHg AV Mean Grad:      2.0 mmHg LVOT Vmax:         65.50 cm/s LVOT Vmean:        43.900 cm/s LVOT VTI:          0.143 m LVOT/AV VTI ratio: 0.71  AORTA Ao Root diam: 3.30 cm MITRAL VALVE               TRICUSPID VALVE MV Area (PHT): 3.53 cm    TR Peak grad:   21.5 mmHg MV Decel Time: 215 msec    TR Vmax:        232.00 cm/s MV E velocity: 93.80 cm/s  SHUNTS                            Systemic VTI:  0.14 m                             Systemic Diam: 2.00 cm Armanda Magic MD Electronically signed by Armanda Magic MD Signature Date/Time: 07/30/2020/9:43:47 AM    Final     Cardiac Studies   2D echo 07/2020 IMPRESSIONS    1. Left ventricular ejection fraction, by estimation, is 55 to 60%. The  left ventricle has normal function. The left ventricle has no regional  wall motion abnormalities. Left ventricular diastolic function could not  be evaluated.   2. Prominent moderator band of no clinical significance. Right  ventricular systolic function is mildly reduced. The right ventricular  size is normal. There is mildly elevated pulmonary artery systolic  pressure. The estimated right ventricular systolic  pressure is 36.5 mmHg.   3. The mitral valve is normal in structure. No evidence of mitral valve  regurgitation. No evidence of mitral stenosis.   4. The aortic valve is normal in structure. Aortic valve regurgitation is  not visualized. No aortic stenosis is present.   5. The inferior vena cava is dilated in size with <50% respiratory  variability, suggesting right atrial pressure of 15 mmHg.   Patient Profile     70 y.o. male  with a PMH of coronary artery calcifications, HTN, HLD, BPH s/p TURP, depression, and tobacco abuse,  who is being seen 07/29/2020 for the evaluation of new onset atrial fibrillation at the request of Dr. Caleb Popp.  Assessment & Plan    1. Acute hypoxic respiratory failure likely 2/2 acute CHF: patient presented with several days of worsening SOB, cough, fatigue. He was hypoxic to 87% on arrival which improved with O2 via [place]. EKG with non-specific ST-T wave abnormalities. BNP 302. HsTrop negative x2. Found to have mild-moderate pleural effusions and mild pericardial effusion on CT but negative for PE. He has wheezing and poor air movement on exam. High suspicion for underlying COPD +/- OSA which may be contributing to his presentation. - Currently on Lasix  IV BID - he put out 1.75L  yesterday and is net neg 2.2L since admit - SCr stable at 0.49 with diuresis - ? Accuracy of weights - Now on Duonebs for wheezing - 2D echo yesterday with normal LV function  - NPO for Lexiscan myoview today - continue Losartan  daily and Lopressor  BID>>plan to transition to Toprol prior to discharge due to likely underlying COPD - consider transitioning to Enresto at discharge (start 36hrs after last ACE I dose that he got prior to starting ARB) -continue IV lasix for now  2. New onset atrial fibrillation: no prior history. EKG with rate controlled atrial fibrillation. TSH is wnl. He was started on metoprolol tartrate  BID (on succinate  daily at home) for rate control and a heparin gtt for stroke ppx. - CHA2DS2-VASc Score = 4 [CHF History: Yes, HTN History: Yes, Diabetes History: No, Stroke History: No, Vascular Disease History: Yes, Age Score: 1, Gender Score: 0].  Therefore, the patient's annual risk of stroke is 4.8 %.    - 2D echo with normal LVF - Continue heparin gtt for now - anticipate transition to eliquis  BID if myoview shows no ischemia - Consider outpatient sleep study to r/o OSA  3. Coronary artery calcifications: noted on prior CT scans. Has not had any ischemic testing. Risk factors for obstructive CAD include HTN, HLD, and tobacco abuse.  - echo with normal LVF - plan for Lexiscan myoview today to rule out ischemia - Continue statin and BB - Continue aggressive risk factor modifications - no ASA due to need for DOAC   4. HTN: BP generally elevated. Home amlodipine stopped and he was started on metoprolol tartrate 50mg  BID (succinate 50mg  daily at home) and Lisinopril which was changed to Losartan - BP controlled on exam this am - Continue Lopressor 50mg  BID and transition to Toprol at discharge - continue Losartan 25mg  daily and transition to Entresto at discharge   5. HLD: LDL 46 09/2019 on lovastatin at home and pravastatin this admission  (hospital formulary) - LDL 33 and HDL 34 this admit - Continue lovastatin for now - low threshold to transition to high-intensity statin pending above work-up   6. Hyponatremia: Na 124 on admission. Likely hypervolemic hyponatremia. - Na 126 this am  - Continue to monitor closely with diuresis as above    7. Tobacco abuse: smokes 1ppd which he has for the past 50 years. Cessation advised. Suspicions high for underlying COPD - Continue to encourage cessation   8. ETOH abuse: drinks 3-4 cocktails 3-4 days per week. Discussed relationship between ETOH abuse and Afib. - Continue to encourage cessation       I have spent a total of 35 minutes with patient reviewing 2D echo , telemetry, EKGs, labs and examining patient as well as establishing an assessment and plan that was discussed with the patient.  > 50% of time was spent in direct patient care.     For questions or updates, please contact [facility] Please consult [email or web address] for contact info under        Signed, , MD  07/30/2020, 9:56 AM

## 2020-07-30 NOTE — Progress Notes (Signed)
PROGRESS NOTE    [facility]  BSJ:[id number] DOB: 1950-12-31 DOA: 07/28/2020 PCP: Oneita Hurt, No   Brief Narrative: Samuel Barnes is a 70 y.o. male with a history of aortic atherosclerosis, BPH, class II obesity, coronary artery calcification, depression, hyperlipidemia, hypertension, alcohol and tobacco use. Patient presented secondary to shortness of breath with evidence of acute heart failure, new onset atrial fibrillation and pleural effusions requiring oxygen. Started on IV lasix, IV heparin. Cardiology consulted on admission.   Assessment & Plan:   Principal Problem:   Acute respiratory failure with hypoxia (HCC) Active Problems:   Pericardial effusion   Aortic atherosclerosis (HCC)   Coronary artery calcification   Class 2 obesity   Hyperlipidemia   Tobacco use   BPH (benign prostatic hyperplasia)   Depression   Hypertension   Hyponatremia   Hypomagnesemia   New onset atrial fibrillation (HCC)   Prolonged QT interval   Hypocalcemia   Alcohol abuse   Fatty liver   Hyperglycemia   New onset a-fib (HCC)   Acute congestive heart failure (HCC)   Acute respiratory failure with hypoxia CT imaging significant for moderate right pleural effusion/small left pleural effusion. Patient has required up to 4 lpm of oxygen. -Wean to room air  Pleural effusions Possibly secondary to heart failure. If no improvement hypoxia with diuresis, will order a thoracentesis.  Acute heart failure No prior history. Troponin of 2>2. Imaging significant for pericardial and pleural effusions. Patient hypervolemic on admission and started on Lasix IV. Cardiology consulted.  -Continue Lasix IV -Follow-up Transthoracic Echocardiogram; read is pending  Paroxysmal atrial fibrillation New onset. Currently rate controlled. CHA2DS2-VASc Score is 4. Patient is on metoprolol tartrate as an outpatient, which was resumed. Patient started on heparin IV on admission. Cardiology consulted as  mentioned above. -Continue heparin IV  Hyponatremia Sodium of 124 on admission. Running diagnosis of hypervolemic hyponatremia. Patient started on fluid/sodium restricted diet in addition to Lasix IV. Some improvement -Continue Lasix -Follow up BMP this morning and repeat BMP based on result  Primary hypertension Patient is on amlodipine and metoprolol as an outpatient. Amlodipine held on admission. Blood pressure is slightly uncontrolled. -Continue metoprolol, losartan started  Coronary artery calcification Hyperlipidemia Seen on imaging. Patient is on lovastatin as an outpatient already. -Continue lovastatin (pravastatin while inpatient)  BPH -Continue terazosin  Depression -Continue Zoloft  Hyperglycemia Fasting blood sugar is slightly elevated. No further management at this time.  Hypocalcemia Borderline. Stable.  Hypomagnesemia Given supplementation. Resolved.  Alcohol use Noted. Does not seem to abuse alcohol per history.  Fatty liver Seen on imaging. Likely related to alcohol consumption but patient also has obesity.  Tobacco use Smoking cessation discussed on admission. Patient decline nicotine replacement therapy. No diagnosis of COPD, but patient has a long smoking history. -Continue Duoneb prn   DVT prophylaxis: Heparin IV Code Status:   Code Status: Full Code Family Communication: None at bedside Disposition Plan: Discharge likely home in several days pending improvement of respiratory status, weaning oxygen to room air, improvement of heart failure   Consultants:  Cardiology  Procedures:  None  Antimicrobials: Ceftriaxone Azithromycin    Subjective: Some chest tightness. States he had a breathing treatment which helped.  Objective: Vitals:   07/29/20 1551 07/29/20 1835 07/29/20 2205 07/30/20 0515  BP:   122/75 138/81  Pulse:   62 70  Resp: 17     Temp:   97.7 F (36.5 C) 98 F (36.7 C)  TempSrc:   Oral Oral  SpO2:   92%   Weight:   99.5 kg  100.5 kg  Height:        Intake/Output Summary (Last 24 hours) at 07/30/2020 0907 Last data filed at 07/30/2020 0516 Gross per 24 hour  Intake 352 ml  Output 1550 ml  Net -1198 ml    Filed Weights   07/28/20 1009 07/29/20 1835 07/30/20 0515  Weight: 104.9 kg 99.5 kg 100.5 kg    Examination:  General exam: Appears calm and comfortable Respiratory system: Clear to auscultation. Respiratory effort normal. Cardiovascular system: S1 & S2 heard, irregular rhythm, normal rate. No murmurs, rubs, gallops or clicks. Gastrointestinal system: Abdomen is nondistended, soft and nontender. No organomegaly or masses felt. Normal bowel sounds heard. Central nervous system: Alert and oriented. No focal neurological deficits. Musculoskeletal: 2+ BLE edema. No calf tenderness Skin: No cyanosis. No rashes Psychiatry: Judgement and insight appear normal. Mood & affect appropriate.    Data Reviewed: I have personally reviewed following labs and imaging studies  CBC Lab Results  Component Value Date   WBC 7.2 07/30/2020   RBC 4.50 07/30/2020   HGB 14.8 07/30/2020   HCT 42.2 07/30/2020   MCV 93.8 07/30/2020   MCH 32.9 07/30/2020   PLT 176 07/30/2020   MCHC 35.1 07/30/2020   RDW 13.1 07/30/2020   LYMPHSABS 0.5 (L) 07/28/2020   MONOABS 0.9 07/28/2020   EOSABS 0.0 07/28/2020   BASOSABS 0.0 07/28/2020     Last metabolic panel Lab Results  Component Value Date   NA 126 (L) 07/30/2020   K 3.8 07/30/2020   CL 87 (L) 07/30/2020   CO2 28 07/30/2020   BUN 9 07/30/2020   CREATININE 0.49 (L) 07/30/2020   GLUCOSE 102 (H) 07/30/2020   GFRNONAA >60 07/30/2020   CALCIUM 8.8 (L) 07/30/2020   PROT 7.2 07/28/2020   ALBUMIN 4.1 07/28/2020   BILITOT 1.0 07/28/2020   ALKPHOS 102 07/28/2020   AST 19 07/28/2020   ALT 14 07/28/2020   ANIONGAP 11 07/30/2020    CBG (last 3)  No results for input(s): GLUCAP in the last 72 hours.   GFR: Estimated Creatinine Clearance: 97.3 mL/min (A) (by  C-G formula based on SCr of 0.49 mg/dL (L)).  Coagulation Profile: No results for input(s): INR, PROTIME in the last 168 hours.  Recent Results (from the past 240 hour(s))  Resp Panel by RT-PCR (Flu A&B, Covid) Nasopharyngeal Swab     Status: None   Collection Time: 07/28/20 10:51 AM   Specimen: Nasopharyngeal Swab; Nasopharyngeal(NP) swabs in vial transport medium  Result Value Ref Range Status   SARS Coronavirus 2 by RT PCR NEGATIVE NEGATIVE Final    Comment: (NOTE) SARS-CoV-2 target nucleic acids are NOT DETECTED.  The SARS-CoV-2 RNA is generally detectable in upper respiratory specimens during the acute phase of infection. The lowest concentration of SARS-CoV-2 viral copies this assay can detect is 138 copies/mL. A negative result does not preclude SARS-Cov-2 infection and should not be used as the sole basis for treatment or other patient management decisions. A negative result may occur with  improper specimen collection/handling, submission of specimen other than nasopharyngeal swab, presence of viral mutation(s) within the areas targeted by this assay, and inadequate number of viral copies(<138 copies/mL). A negative result must be combined with clinical observations, patient history, and epidemiological information. The expected result is Negative.  Fact Sheet for Patients:  BloggerCourse.com  Fact Sheet for Healthcare Providers:  SeriousBroker.it  This test is no t yet approved or cleared  by the [place] FDA and  has been authorized for detection and/or diagnosis of SARS-CoV-2 by FDA under an Emergency Use Authorization (EUA). This EUA will remain  in effect (meaning this test can be used) for the duration of the COVID-19 declaration under Section 564(b)(1) of the Act, 21 U.S.C.section 360bbb-3(b)(1), unless the authorization is terminated  or revoked sooner.       Influenza A by PCR NEGATIVE NEGATIVE Final    Influenza B by PCR NEGATIVE NEGATIVE Final    Comment: (NOTE) The Xpert Xpress SARS-CoV-2/FLU/RSV plus assay is intended as an aid in the diagnosis of influenza from Nasopharyngeal swab specimens and should not be used as a sole basis for treatment. Nasal washings and aspirates are unacceptable for Xpert Xpress SARS-CoV-2/FLU/RSV testing.  Fact Sheet for Patients: BloggerCourse.comhttps://www.fda.gov/media/152166/download  Fact Sheet for Healthcare Providers: SeriousBroker.ithttps://www.fda.gov/media/152162/download  This test is not yet approved or cleared by the [place] FDA and has been authorized for detection and/or diagnosis of SARS-CoV-2 by FDA under an Emergency Use Authorization (EUA). This EUA will remain in effect (meaning this test can be used) for the duration of the COVID-19 declaration under Section 564(b)(1) of the Act, 21 U.S.C. section 360bbb-3(b)(1), unless the authorization is terminated or revoked.  Performed at [facility] [place], [address] [place], [place] [address]   Blood culture (routine x 2)     Status: None (Preliminary result)   Collection Time: 07/28/20  2:43 PM   Specimen: Left Antecubital; Blood  Result Value Ref Range Status   Specimen Description   Final    LEFT ANTECUBITAL BLOOD Performed at [facility] [place], [address] [place], [place] [address]    Special Requests   Final    BOTTLES DRAWN AEROBIC AND ANAEROBIC Blood Culture adequate volume Performed at [facility] [place], [address] [place], [place] [address]    Culture   Final    NO GROWTH 2 DAYS Performed at [facility], [address] [place], [place] [address]    Report Status PENDING  Incomplete  Blood culture (routine x 2)     Status: None (Preliminary result)   Collection Time: 07/28/20  2:53 PM   Specimen: BLOOD RIGHT HAND  Result Value Ref Range Status   Specimen Description   Final    BLOOD RIGHT HAND BLOOD Performed at [facility] [place], [address] [place], [place] [address]    Special Requests   Final    BOTTLES DRAWN AEROBIC AND ANAEROBIC Blood Culture adequate volume Performed at [facility] [place], [address] [place], [place] [address]    Culture   Final    NO GROWTH 2 DAYS Performed at [facility], [address] [place], [place] [address]    Report Status PENDING  Incomplete  C Difficile Quick Screen w PCR reflex     Status: None   Collection Time: 07/28/20 11:55 PM   Specimen: STOOL  Result Value Ref Range Status   C Diff antigen NEGATIVE NEGATIVE Final   C Diff toxin NEGATIVE NEGATIVE Final   C Diff interpretation No C. difficile detected.  Final    Comment: Performed at [facility], [address] [place], [place] [address]        Radiology Studies: CT Angio Chest PE W and/or Wo Contrast  Result Date: 07/28/2020 CLINICAL DATA:  Shortness of breath for 3 days.  Elevated D-dimer. EXAM:  CT ANGIOGRAPHY CHEST WITH CONTRAST TECHNIQUE: Multidetector CT imaging of the chest was performed using the standard protocol during bolus administration of intravenous contrast. Multiplanar CT image reconstructions and MIPs were obtained to evaluate the vascular anatomy. CONTRAST:  OMNIPAQUE IOHEXOL 350 MG/ML SOLN COMPARISON:  CT chest, abdomen and pelvis 04/18/2017. Single-view of the chest today and PA and lateral chest 03/02/2017. FINDINGS: Cardiovascular: Satisfactory of opacification of the pulmonary arteries to the segmental level. No pulmonary embolus is seen. There is cardiomegaly. Small pericardial effusion is present. No aortic aneurysm. Calcific aortic and coronary atherosclerosis. Mediastinum/Nodes: No enlarged mediastinal, hilar, or axillary lymph nodes. Thyroid gland, trachea, and esophagus demonstrate no significant findings. Lungs/Pleura: Moderate right and very small left pleural effusions are seen. There is compressive atelectasis in the right lower lobe due to the patient's effusion. No  consolidative process is identified. No nodule or mass is seen. Upper Abdomen: The liver is low attenuating consistent with fatty infiltration. A simple cyst measuring 3.4 cm is identified. There is some thickening of the adrenal glands compatible with hyperplasia, unchanged. Left renal cysts are partially imaged. Musculoskeletal: No acute abnormality or focal lesion. Remote healed right rib fractures are noted. Review of the MIP images confirms the above findings. IMPRESSION: Negative for pulmonary embolus. Moderate right and small left pleural effusions with compressive atelectasis in the right lower lobe. Cause for the effusions is not identified. Cardiomegaly. Small pericardial effusion. Calcific coronary artery disease. Fatty infiltration of the liver. Aortic Atherosclerosis (ICD10-I70.0). Electronically Signed   By: Drusilla Kanner M.D.   On: 07/28/2020 13:37   DG Chest Portable 1 View  Result Date: 07/28/2020 CLINICAL DATA:  cough, chills, hypoxia, shortness of breath. new afib EXAM: PORTABLE CHEST 1 VIEW COMPARISON:  04/09/2017 FINDINGS: Mild cardiomegaly. Cardiac silhouette has enlarged compared to the previous study. Atherosclerotic calcification of the aortic knob. Moderate-sized right-sided pleural effusion with hazy right basilar airspace opacity. No left-sided pleural effusion is seen. No pneumothorax. Multiple chronic right-sided rib fractures. IMPRESSION: 1. Moderate right-sided pleural effusion with hazy right basilar airspace opacity, which may reflect atelectasis versus pneumonia. 2. Cardiac silhouette has enlarged compared to the previous study. Underlying pericardial effusion not excluded. Electronically Signed   By: Duanne Guess D.O.   On: 07/28/2020 11:17        Scheduled Meds:  FLUoxetine  20 mg Oral QHS   folic acid  1 mg Oral Daily   furosemide  40 mg Intravenous BID   losartan  25 mg Oral Daily   metoprolol tartrate  50 mg Oral BID   montelukast  10 mg Oral Daily    multivitamin with minerals  1 tablet Oral Daily   potassium chloride  20 mEq Oral Daily   pravastatin  20 mg Oral q1800   terazosin  5 mg Oral Daily   thiamine  100 mg Oral Daily   Continuous Infusions:  heparin 1,600 Units/hr (07/29/20 2212)     LOS: 2 days     Jacquelin Hawking, MD [facility] 07/30/2020, 9:07 AM  If 7PM-7AM, please contact night-coverage [email or web address]

## 2020-07-31 ENCOUNTER — Inpatient Hospital Stay (HOSPITAL_COMMUNITY): Payer: Medicare Other

## 2020-07-31 DIAGNOSIS — I509 Heart failure, unspecified: Secondary | ICD-10-CM | POA: Diagnosis not present

## 2020-07-31 DIAGNOSIS — I5031 Acute diastolic (congestive) heart failure: Secondary | ICD-10-CM | POA: Diagnosis not present

## 2020-07-31 DIAGNOSIS — I2510 Atherosclerotic heart disease of native coronary artery without angina pectoris: Secondary | ICD-10-CM | POA: Diagnosis not present

## 2020-07-31 DIAGNOSIS — I4891 Unspecified atrial fibrillation: Secondary | ICD-10-CM | POA: Diagnosis not present

## 2020-07-31 DIAGNOSIS — J9601 Acute respiratory failure with hypoxia: Secondary | ICD-10-CM | POA: Diagnosis not present

## 2020-07-31 DIAGNOSIS — K760 Fatty (change of) liver, not elsewhere classified: Secondary | ICD-10-CM | POA: Diagnosis not present

## 2020-07-31 DIAGNOSIS — I700 Atherosclerosis of aorta: Secondary | ICD-10-CM | POA: Diagnosis not present

## 2020-07-31 HISTORY — PX: IR THORACENTESIS ASP PLEURAL SPACE W/IMG GUIDE: IMG5380

## 2020-07-31 LAB — LACTATE DEHYDROGENASE, PLEURAL OR PERITONEAL FLUID: LD, Fluid: 115 U/L — ABNORMAL HIGH (ref 3–23)

## 2020-07-31 LAB — PROTEIN, PLEURAL OR PERITONEAL FLUID: Total protein, fluid: <3 g/dL

## 2020-07-31 LAB — CBC
HCT: 42.8 % (ref 39.0–52.0)
Hemoglobin: 14.4 g/dL (ref 13.0–17.0)
MCH: 32 pg (ref 26.0–34.0)
MCHC: 33.6 g/dL (ref 30.0–36.0)
MCV: 95.1 fL (ref 80.0–100.0)
Platelets: 160 10*3/uL (ref 150–400)
RBC: 4.5 MIL/uL (ref 4.22–5.81)
RDW: 13.1 % (ref 11.5–15.5)
WBC: 7.8 10*3/uL (ref 4.0–10.5)
nRBC: 0 % (ref 0.0–0.2)

## 2020-07-31 LAB — BASIC METABOLIC PANEL
Anion gap: 8 (ref 5–15)
Anion gap: 9 (ref 5–15)
BUN: 10 mg/dL (ref 8–23)
BUN: 11 mg/dL (ref 8–23)
CO2: 29 mmol/L (ref 22–32)
CO2: 31 mmol/L (ref 22–32)
Calcium: 8.6 mg/dL — ABNORMAL LOW (ref 8.9–10.3)
Calcium: 9.2 mg/dL (ref 8.9–10.3)
Chloride: 88 mmol/L — ABNORMAL LOW (ref 98–111)
Chloride: 89 mmol/L — ABNORMAL LOW (ref 98–111)
Creatinine, Ser: 0.48 mg/dL — ABNORMAL LOW (ref 0.61–1.24)
Creatinine, Ser: 0.6 mg/dL — ABNORMAL LOW (ref 0.61–1.24)
GFR, Estimated: >60 mL/min (ref >60)
GFR, Estimated: >60 mL/min (ref >60)
Glucose, Bld: 109 mg/dL — ABNORMAL HIGH (ref 70–99)
Glucose, Bld: 100 mg/dL — ABNORMAL HIGH (ref 70–99)
Potassium: 3.9 mmol/L (ref 3.5–5.1)
Potassium: 3.9 mmol/L (ref 3.5–5.1)
Sodium: 125 mmol/L — ABNORMAL LOW (ref 135–145)
Sodium: 129 mmol/L — ABNORMAL LOW (ref 135–145)

## 2020-07-31 LAB — BODY FLUID CELL COUNT WITH DIFFERENTIAL
Eos, Fluid: 0 %
Lymphs, Fluid: 35 %
Monocyte-Macrophage-Serous Fluid: 27 % — ABNORMAL LOW (ref 50–90)
Neutrophil Count, Fluid: 38 % — ABNORMAL HIGH (ref 0–25)
Total Nucleated Cell Count, Fluid: 545 cu mm (ref 0–1000)

## 2020-07-31 LAB — HEMOGLOBIN A1C
Hgb A1c MFr Bld: 5.7 % — ABNORMAL HIGH (ref 4.8–5.6)
Mean Plasma Glucose: 117 mg/dL

## 2020-07-31 LAB — HEPARIN LEVEL (UNFRACTIONATED)
Heparin Unfractionated: 0.21 IU/mL — ABNORMAL LOW (ref 0.30–0.70)
Heparin Unfractionated: 0.49 IU/mL (ref 0.30–0.70)

## 2020-07-31 LAB — PROTEIN, TOTAL: Total Protein: 6.4 g/dL — ABNORMAL LOW (ref 6.5–8.1)

## 2020-07-31 LAB — BRAIN NATRIURETIC PEPTIDE: B Natriuretic Peptide: 245.9 pg/mL — ABNORMAL HIGH (ref 0.0–100.0)

## 2020-07-31 LAB — LACTATE DEHYDROGENASE: LDH: 159 U/L (ref 98–192)

## 2020-07-31 MED ORDER — LOSARTAN POTASSIUM 50 MG PO TABS
50.0000 mg | ORAL_TABLET | Freq: Every day | ORAL | Status: DC
  Administered 2020-08-01 – 2020-08-09 (×9): 50 mg via ORAL
  Filled 2020-07-31 (×9): qty 1

## 2020-07-31 MED ORDER — LIDOCAINE HCL 1 % IJ SOLN
INTRAMUSCULAR | Status: AC
  Filled 2020-07-31: qty 20

## 2020-07-31 MED ORDER — LOSARTAN POTASSIUM 25 MG PO TABS
25.0000 mg | ORAL_TABLET | Freq: Once | ORAL | Status: AC
  Administered 2020-07-31: 25 mg via ORAL
  Filled 2020-07-31: qty 1

## 2020-07-31 MED ORDER — LIDOCAINE HCL 1 % IJ SOLN
INTRAMUSCULAR | Status: DC | PRN
  Administered 2020-07-31: 10 mL

## 2020-07-31 NOTE — Care Management Important Message (Signed)
Important Message  Patient Details  Name: Samuel Barnes MRN: [id number] Date of Birth: 1950/09/09   Medicare Important Message Given:  Yes     Tarrin Menn Stefan Church 07/31/2020, 4:01 PM

## 2020-07-31 NOTE — Progress Notes (Addendum)
Pt complaining of mild pain at thoracentesis insertion site at approximately 6pm. Pt declined tylenol at this time and stated he would notify staff if he changed his mind. VS stable on 5L with no complaints of SOB at that time. During bedside rounding with oncoming RN, pt reporting worsening pain and mild SOB. SpO2 now 88% on 5LNC. PRN tylenol and douneb given with no increase in SpO2. Notified RT in hallway and pt placed on Salter HiFlo Nasal Cannula at 8L. No change in Spo2 but pt reports improvement in SOB.  Night shift RN Dillion, paging night shift coverage to make aware.

## 2020-07-31 NOTE — Procedures (Signed)
PROCEDURE SUMMARY:  Successful US guided right thoracentesis. Yielded 1.5 L of amber-colored fluid. Pt tolerated procedure well. No immediate complications.  Specimen  sent for labs. CXR ordered; no post-procedure pneumothorax identified  EBL < 3 mL  Mickie Kay, NP 07/31/2020 4:35 PM

## 2020-07-31 NOTE — Progress Notes (Signed)
Progress Note  Patient Name: Samuel Barnes Date of Encounter: 07/31/2020  [facility] Cardiologist: Armanda Magic, MD   Subjective   Doing well.  NO complaints of SOB or CP.  2D echo with normal LVF.  No PE on chest CTA.  No ischemia on nuclear stress test  Inpatient Medications    Scheduled Meds:  FLUoxetine  20 mg Oral QHS   folic acid  1 mg Oral Daily   furosemide  40 mg Intravenous BID   losartan  25 mg Oral Daily   metoprolol tartrate  50 mg Oral BID   montelukast  10 mg Oral Daily   multivitamin with minerals  1 tablet Oral Daily   potassium chloride  20 mEq Oral Daily   pravastatin  20 mg Oral q1800   terazosin  5 mg Oral Daily   thiamine  100 mg Oral Daily   Continuous Infusions:  heparin 1,800 Units/hr (07/31/20 0527)   PRN Meds: acetaminophen **OR** acetaminophen, ipratropium-albuterol, prochlorperazine   Vital Signs    Vitals:   07/30/20 1755 07/30/20 2143 07/31/20 0446 07/31/20 0500  BP:  (!) 145/79 (!) 152/81   Pulse:  71 69   Resp:  20    Temp:  97.8 F (36.6 C) 98.1 F (36.7 C)   TempSrc:  Oral Oral   SpO2: 92% 91% 91%   Weight:    97.7 kg  Height:        Intake/Output Summary (Last 24 hours) at 07/31/2020 1610 Last data filed at 07/30/2020 2100 Gross per 24 hour  Intake --  Output 1100 ml  Net -1100 ml    Last 3 Weights 07/31/2020 07/30/2020 07/29/2020  Weight (lbs) 215 lb 6.4 oz 221 lb 9 oz 219 lb 4.8 oz  Weight (kg) 97.705 kg 100.5 kg 99.474 kg      Telemetry    Atrial fibrillation - Personally Reviewed  ECG    No new EKG to review - Personally Reviewed  Physical Exam   GEN: Well nourished, well developed in no acute distress HEENT: Normal NECK: No JVD; No carotid bruits LYMPHATICS: No lymphadenopathy CARDIAC:irregularly irregular, no murmurs, rubs, gallops RESPIRATORY:  Clear to auscultation without rales, wheezing or rhonchi  ABDOMEN: Soft, non-tender, non-distended MUSCULOSKELETAL:  No edema; No deformity   SKIN: Warm and dry NEUROLOGIC:  Alert and oriented x 3 PSYCHIATRIC:  Normal affect   Labs    High Sensitivity Troponin:   Recent Labs  Lab 07/28/20 1051 07/28/20 1249  TROPONINIHS 2 2       Chemistry Recent Labs  Lab 07/28/20 1051 07/29/20 0453 07/30/20 0555 07/30/20 1418 07/31/20 0347  NA 124*   < > 126* 127* 125*  K 4.3   < > 3.8 3.3* 3.9  CL 84*   < > 87* 88* 88*  CO2 30   < > GLUCOSE 130*   < > 102* 143* 109*  BUN 10   < > CREATININE 0.50*   < > 0.49* 0.59* 0.48*  CALCIUM 8.7*   < > 8.8* 8.9 8.6*  PROT 7.2  --   --   --   --   ALBUMIN 4.1  --   --   --   --   AST 19  --   --   --   --   ALT 14  --   --   --   --   ALKPHOS 102  --   --   --   --  BILITOT 1.0  --   --   --   --   GFRNONAA >60   < > >60 >60 >60  ANIONGAP 10   < > < > = values in this interval not displayed.      Hematology Recent Labs  Lab 07/29/20 0453 07/30/20 0555 07/31/20 0347  WBC 7.6 7.2 7.8  RBC 4.51 4.50 4.50  HGB 14.9 14.8 14.4  HCT 42.5 42.2 42.8  MCV 94.2 93.8 95.1  MCH 33.0 32.9 32.0  MCHC 35.1 35.1 33.6  RDW 12.9 13.1 13.1  PLT 205 176 160     BNP Recent Labs  Lab 07/28/20 1051  BNP 302.6*      DDimer  Recent Labs  Lab 07/28/20 1051  DDIMER 1.21*     CHA2DS2-VASc Score = 4  This indicates a 4.8% annual risk of stroke. The patient's score is based upon: CHF History: Yes HTN History: Yes Diabetes History: No Stroke History: No Vascular Disease History: Yes Age Score: 1 Gender Score: 0   Radiology    NM Myocar Multi W/Spect W/Wall Motion / EF  Result Date: 07/30/2020 CLINICAL DATA:  Atrial fibrillation. EXAM: MYOCARDIAL IMAGING WITH SPECT (REST AND PHARMACOLOGIC-STRESS) GATED LEFT VENTRICULAR WALL MOTION STUDY LEFT VENTRICULAR EJECTION FRACTION TECHNIQUE: Standard myocardial SPECT imaging was performed after resting intravenous injection of 10.7 mCi Tc-76m tetrofosmin. Subsequently, intravenous infusion of Lexiscan was  performed under the supervision of the Cardiology staff. At peak effect of the drug, 31.0 mCi Tc-66m tetrofosmin was injected intravenously and standard myocardial SPECT imaging was performed. Quantitative gated imaging was also performed to evaluate left ventricular wall motion, and estimate left ventricular ejection fraction. COMPARISON:  None. FINDINGS: Perfusion: Fixed defect is seen involving inferior septal myocardium toward the apex. No definite reversibility is noted. Wall Motion: Normal left ventricular wall motion. No left ventricular dilation. Left Ventricular Ejection Fraction: 65 % End diastolic volume 86 ml End systolic volume 30 ml IMPRESSION: 1. 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 No definite evidence of reversibility is noted to suggest acute ischemia. 2. Normal left ventricular wall motion. 3. Left ventricular ejection fraction 65% 4. Non invasive risk stratification*: Low *2012 Appropriate Use Criteria for Coronary Revascularization Focused Update: J Am Coll Cardiol. 2012;59(9):857-881. http://content.dementiazones.com.aspx?articleid=[email or web address] Electronically Signed   By: Lupita Raider M.D.   On: 07/30/2020 14:18   ECHOCARDIOGRAM COMPLETE  Result Date: 07/30/2020    ECHOCARDIOGRAM REPORT   Patient Name:   Samuel Barnes Date of Exam: 07/29/2020 Medical Rec #:  [id number]             Height:       68.0 in Accession #:    [id number]            Weight:       231.3 lb Date of Birth:  1950/09/09             BSA:          2.174 m Patient Age:    70 years              BP:           139/80 mmHg Patient Gender: M                     HR:           68 bpm. Exam Location:  Inpatient Procedure: 2D Echo, Cardiac Doppler, Color  Doppler and Intracardiac            Opacification Agent Indications:    CHF                 Atrial fibrillation  History:        Patient has no prior history of Echocardiogram examinations.                 CAD;  Risk Factors:Dyslipidemia, Hypertension and Current Smoker.                 ETOH use.  Sonographer:    Ross LudwigArthur Guy RDCS (AE) Referring Phys: [id number] DAVID Kelby FamMANUEL ORTIZ  Sonographer Comments: Patient is morbidly obese and Technically difficult study due to poor echo windows. Image acquisition challenging due to patient body habitus. IMPRESSIONS  1. Left ventricular ejection fraction, by estimation, is 55 to 60%. The left ventricle has normal function. The left ventricle has no regional wall motion abnormalities. Left ventricular diastolic function could not be evaluated.  2. Prominent moderator band of no clinical significance. Right ventricular systolic function is mildly reduced. The right ventricular size is normal. There is mildly elevated pulmonary artery systolic pressure. The estimated right ventricular systolic pressure is 36.5 mmHg.  3. The mitral valve is normal in structure. No evidence of mitral valve regurgitation. No evidence of mitral stenosis.  4. The aortic valve is normal in structure. Aortic valve regurgitation is not visualized. No aortic stenosis is present.  5. The inferior vena cava is dilated in size with <50% respiratory variability, suggesting right atrial pressure of 15 mmHg. FINDINGS  Left Ventricle: Left ventricular ejection fraction, by estimation, is 55 to 60%. The left ventricle has normal function. The left ventricle has no regional wall motion abnormalities. Definity contrast agent was given IV to delineate the left ventricular  endocardial borders. The left ventricular internal cavity size was normal in size. There is no left ventricular hypertrophy. Left ventricular diastolic function could not be evaluated due to atrial fibrillation. Left ventricular diastolic function could  not be evaluated. Normal left ventricular filling pressure. Right Ventricle: Prominent moderator band of no clinical significance. The right ventricular size is normal. No increase in right ventricular wall  thickness. Right ventricular systolic function is mildly reduced. There is mildly elevated pulmonary artery  systolic pressure. The tricuspid regurgitant velocity is 2.32 m/s, and with an assumed right atrial pressure of 15 mmHg, the estimated right ventricular systolic pressure is 36.5 mmHg. Left Atrium: Left atrial size was normal in size. Right Atrium: Right atrial size was normal in size. Pericardium: There is no evidence of pericardial effusion. Mitral Valve: The mitral valve is normal in structure. No evidence of mitral valve regurgitation. No evidence of mitral valve stenosis. Tricuspid Valve: The tricuspid valve is normal in structure. Tricuspid valve regurgitation is not demonstrated. No evidence of tricuspid stenosis. Aortic Valve: The aortic valve is normal in structure. Aortic valve regurgitation is not visualized. No aortic stenosis is present. Aortic valve mean gradient measures 2.0 mmHg. Aortic valve peak gradient measures 3.7 mmHg. Aortic valve area, by VTI measures 2.22 cm. Pulmonic Valve: The pulmonic valve was normal in structure. Pulmonic valve regurgitation is not visualized. No evidence of pulmonic stenosis. Aorta: The aortic root is normal in size and structure. Venous: The inferior vena cava is dilated in size with less than 50% respiratory variability, suggesting right atrial pressure of 15 mmHg. IAS/Shunts: No atrial level shunt detected by color flow Doppler.  LEFT VENTRICLE PLAX 2D LVIDd:  4.40 cm  Diastology LVIDs:         4.00 cm  LV e' medial:    11.40 cm/s LV PW:         1.20 cm  LV E/e' medial:  8.2 LV IVS:        0.80 cm  LV e' lateral:   11.30 cm/s LVOT diam:     2.00 cm  LV E/e' lateral: 8.3 LV SV:         45 LV SV Index:   21 LVOT Area:     3.14 cm  RIGHT VENTRICLE          IVC RV Basal diam:  3.40 cm  IVC diam: 3.20 cm LEFT ATRIUM             Index       RIGHT ATRIUM           Index LA diam:        5.30 cm 2.44 cm/m  RA Area:     24.00 cm LA Vol (A2C):   89.3 ml 41.08  ml/m RA Volume:   68.80 ml  31.65 ml/m LA Vol (A4C):   63.1 ml 29.03 ml/m LA Biplane Vol: 77.9 ml 35.83 ml/m  AORTIC VALVE AV Area (Vmax):    2.13 cm AV Area (Vmean):   2.22 cm AV Area (VTI):     2.22 cm AV Vmax:           96.40 cm/s AV Vmean:          62.000 cm/s AV VTI:            0.202 m AV Peak Grad:      3.7 mmHg AV Mean Grad:      2.0 mmHg LVOT Vmax:         65.50 cm/s LVOT Vmean:        43.900 cm/s LVOT VTI:          0.143 m LVOT/AV VTI ratio: 0.71  AORTA Ao Root diam: 3.30 cm MITRAL VALVE               TRICUSPID VALVE MV Area (PHT): 3.53 cm    TR Peak grad:   21.5 mmHg MV Decel Time: 215 msec    TR Vmax:        232.00 cm/s MV E velocity: 93.80 cm/s                            SHUNTS                            Systemic VTI:  0.14 m                            Systemic Diam: 2.00 cm Armanda Magic MD Electronically signed by Armanda Magic MD Signature Date/Time: 07/30/2020/9:43:47 AM    Final     Cardiac Studies   2D echo 07/2020 IMPRESSIONS    1. Left ventricular ejection fraction, by estimation, is 55 to 60%. The  left ventricle has normal function. The left ventricle has no regional  wall motion abnormalities. Left ventricular diastolic function could not  be evaluated.   2. Prominent moderator band of no clinical significance. Right  ventricular systolic function is mildly reduced. The right ventricular  size is normal. There is mildly elevated pulmonary artery systolic  pressure. The estimated right ventricular systolic  pressure is 36.5 mmHg.   3. The mitral valve is normal in structure. No evidence of mitral valve  regurgitation. No evidence of mitral stenosis.   4. The aortic valve is normal in structure. Aortic valve regurgitation is  not visualized. No aortic stenosis is present.   5. The inferior vena cava is dilated in size with <50% respiratory  variability, suggesting right atrial pressure of 15 mmHg.   Patient Profile     70 y.o. male  with a PMH of coronary artery  calcifications, HTN, HLD, BPH s/p TURP, depression, and tobacco abuse,  who is being seen 07/29/2020 for the evaluation of new onset atrial fibrillation at the request of Dr. Caleb Popp.  Assessment & Plan    1. Acute hypoxic respiratory failure likely 2/2 acute CHF: patient presented with several days of worsening SOB, cough, fatigue. He was hypoxic to 87% on arrival which improved with O2 via [place]. EKG with non-specific ST-T wave abnormalities. BNP 302. HsTrop negative x2. Found to have mild-moderate pleural effusions and mild pericardial effusion on CT but negative for PE. He has wheezing and poor air movement on exam.  - High suspicion for underlying COPD +/- OSA which may be contributing to his presentation. - his wheezing has resolved and BS are better with nebulizers - he has no edema on exam but still SOB some - Currently on Lasix 40mg  IV BID - he put out  1.3L yesterday and is net neg 3.5L since admit - SCr stable at 0.48 with diuresis - down 6lbs today and 15lbs overall since admit - Now on Duonebs for wheezing - 2D echo  with normal LV function  - Lexiscan myoview with no ischemia.   - [facility] is getting Cxray to check on effusions - will check a BNP today - his low Na is not improving with diuresis so ? Other etiology - continue Lopressor 50mg  BID>>plan to transition to Toprol prior to discharge due to likely underlying COPD - continue IV lasix for now - increase Losartan to 50mg  daily for BP control - await result of BNP and Cxray - plan per TRH is thoracentesis if pleural effusions present and large enough today  2. New onset atrial fibrillation: no prior history. EKG with rate controlled atrial fibrillation. TSH is wnl. He was started on metoprolol tartrate 50mg  BID (on succinate 50mg  daily at home) for rate control and a heparin gtt for stroke ppx. - CHA2DS2-VASc Score = 4 [CHF History: Yes, HTN History: Yes, Diabetes History: No, Stroke History: No, Vascular Disease History: Yes, Age  Score: 1, Gender Score: 0].  Therefore, the patient's annual risk of stroke is 4.8 %.    - 2D echo with normal LVF - Consider outpatient sleep study to r/o OSA - plan for TEE/DCCV tomorrow since may get thoracentesis today - make NPO after MN tonight   3. Coronary artery calcifications: noted on prior CT scans. Has not had any ischemic testing. Risk factors for obstructive CAD include HTN, HLD, and tobacco abuse.  - echo with normal LVF - Lexiscan myoview with no ischemia - Continue statin and BB - Continue aggressive risk factor modifications - no ASA due to need for DOAC   4. HTN: BP generally elevated. Home amlodipine stopped and he was started on metoprolol tartrate 50mg  BID (succinate 50mg  daily at home) and Lisinopril which was changed to Losartan - BP borderline controlled on exam this am - Continue Lopressor 50mg  BID and transition  to Toprol at discharge - Increase Losartan to 50mg  daily - check BMET in am   5. HLD: LDL 46 09/2019 on lovastatin at home and pravastatin this admission (hospital formulary) - LDL 33 and HDL 34 this admit - Continue lovastatin for now - low threshold to transition to high-intensity statin pending above work-up   6. Hyponatremia: Na 124 on admission. Likely hypervolemic hyponatremia. - Na has been 124-128 with diuresis (back down to 125 today) - Continue to monitor closely with diuresis as above    7. Tobacco abuse: smokes 1ppd which he has for the past 50 years. Cessation advised. Suspicions high for underlying COPD - Continue to encourage cessation   8. ETOH abuse: drinks 3-4 cocktails 3-4 days per week. Discussed relationship between ETOH abuse and Afib. - Continue to encourage cessation       I have spent a total of 35 minutes with patient reviewing 2D echo , telemetry, EKGs, labs and examining patient as well as establishing an assessment and plan that was discussed with the patient.  > 50% of time was spent in direct patient care.     For  questions or updates, please contact [facility] Please consult [email or web address] for contact info under        Signed, 10/2019, MD  07/31/2020, 9:53 AM

## 2020-07-31 NOTE — Progress Notes (Signed)
ANTICOAGULATION CONSULT NOTE - Follow Up Consult  Pharmacy Consult for Heparin Indication: atrial fibrillation  Labs: Recent Labs    07/28/20 1051 07/28/20 1249 07/28/20 2330 07/29/20 0453 07/29/20 0803 07/29/20 1137 07/29/20 2309 07/30/20 0555 07/30/20 1418 07/31/20 0347  HGB 15.8  --   --  14.9  --   --   --  14.8  --  14.4  HCT 44.4  --   --  42.5  --   --   --  42.2  --  42.8  PLT 209  --   --  205  --   --   --  176  --  160  HEPARINUNFRC  --   --    < >  --  0.57  --   --  0.31  --  0.21*  CREATININE 0.50*  --   --  0.51*  --    < > 0.60* 0.49* 0.59*  --   TROPONINIHS 2 2  --   --   --   --   --   --   --   --    < > = values in this interval not displayed.   Assessment: 54 YOM presented with fatigue, SOB, with new onset afib. He is not on AC at home, CBC stable, Hgb 14.8, plts 176. No signs or symptoms of bleeding noted. Heparin level 0.21 units/ml  No issues noted with infusion  Goal of Therapy:  Heparin level 0.3-0.7 units/ml Monitor platelets by anticoagulation protocol: Yes   Plan:  Increase heparin IV to 1800 units/hr Check heparin level 6 hours after rate change  Thanks for allowing pharmacy to be a part of this patient's care.  Talbert Cage, PharmD Clinical Pharmacist

## 2020-07-31 NOTE — Progress Notes (Signed)
PROGRESS NOTE    Samuel Barnes [facility]  YOV:[id number] DOB: 1950/09/16 DOA: 07/28/2020 PCP: Oneita Hurt, No   Brief Narrative: Samuel Barnes is a 70 y.o. male with a history of aortic atherosclerosis, BPH, class II obesity, coronary artery calcification, depression, hyperlipidemia, hypertension, alcohol and tobacco use. Patient presented secondary to shortness of breath with evidence of acute heart failure, new onset atrial fibrillation and pleural effusions requiring oxygen. Started on IV lasix, IV heparin. Cardiology consulted on admission.   Assessment & Plan:   Principal Problem:   Acute respiratory failure with hypoxia (HCC) Active Problems:   Pericardial effusion   Aortic atherosclerosis (HCC)   Coronary artery calcification   Class 2 obesity   Hyperlipidemia   Tobacco use   BPH (benign prostatic hyperplasia)   Depression   Hypertension   Hyponatremia   Hypomagnesemia   New onset atrial fibrillation (HCC)   Prolonged QT interval   Hypocalcemia   Alcohol abuse   Fatty liver   Hyperglycemia   New onset a-fib (HCC)   Acute congestive heart failure (HCC)   Acute respiratory failure with hypoxia CT imaging significant for moderate right pleural effusion/small left pleural effusion. Patient has required up to 4 lpm of oxygen. -Wean to room air  Right pleural effusion Possibly secondary to heart failure. If no improvement hypoxia with diuresis, will order a thoracentesis. -Repeat chest x-ray today, if still moderate will order thoracentesis  Acute heart failure No prior history. Troponin of 2>2. Imaging significant for pericardial and pleural effusions. Patient hypervolemic on admission and started on Lasix IV. Cardiology consulted. Transthoracic Echocardiogram significant for normal EF, however LV diastolic function could not be evaluated; left atrium normal in size -Continue Lasix IV  Paroxysmal atrial fibrillation New onset. Currently rate controlled.  CHA2DS2-VASc Score is 4. Patient is on metoprolol tartrate as an outpatient, which was resumed. Patient started on heparin IV on admission. Cardiology consulted as mentioned above. -Continue heparin IV -Plan for Transesophageal Echocardiogram/DCCV on 7/26  Hyponatremia Sodium of 124 on admission. Running diagnosis of hypervolemic hyponatremia. Patient started on fluid/sodium restricted diet in addition to Lasix IV. Some improvement -Continue Lasix -Follow up BMP this morning and repeat BMP based on result  Primary hypertension Patient is on amlodipine and metoprolol as an outpatient. Amlodipine held on admission. Blood pressure is slightly uncontrolled. -Continue metoprolol, losartan started  Coronary artery calcification Hyperlipidemia Seen on imaging. Patient is on lovastatin as an outpatient already. -Continue lovastatin (pravastatin while inpatient)  BPH -Continue terazosin  Depression -Continue Zoloft  Hyperglycemia Prediabetes Mild. Fasting CBG not in the range for diabetes. Hemoglobin A1C of 5.7%. Can consider metformin as an outpatient.  Hypocalcemia Borderline. Stable.  Hypomagnesemia Given supplementation. Resolved.  Alcohol use Noted. Does not seem to abuse alcohol per history.  Fatty liver Seen on imaging. Likely related to alcohol consumption but patient also has obesity.  Tobacco use Smoking cessation discussed on admission. Patient decline nicotine replacement therapy. No diagnosis of COPD, but patient has a long smoking history. -Continue Duoneb prn   DVT prophylaxis: Heparin IV Code Status:   Code Status: Full Code Family Communication: None at bedside Disposition Plan: Discharge likely home in several days pending improvement of respiratory status, weaning oxygen to room air, improvement of heart failure, cardiology recommendations/management   Consultants:  Cardiology  Procedures:  None  Antimicrobials: Ceftriaxone Azithromycin     Subjective: Dyspnea is still present. Breathing treatments help.  Objective: Vitals:   07/30/20 1755 07/30/20 2143 07/31/20 0446 07/31/20 0500  BP:  (!) 145/79 (!) 152/81   Pulse:  71 69   Resp:  20    Temp:  97.8 F (36.6 C) 98.1 F (36.7 C)   TempSrc:  Oral Oral   SpO2: 92% 91% 91%   Weight:    97.7 kg  Height:        Intake/Output Summary (Last 24 hours) at 07/31/2020 1009 Last data filed at 07/30/2020 2100 Gross per 24 hour  Intake --  Output 1100 ml  Net -1100 ml    Filed Weights   07/29/20 1835 07/30/20 0515 07/31/20 0500  Weight: 99.5 kg 100.5 kg 97.7 kg    Examination:  General exam: Appears calm and comfortable Respiratory system: Diminished but clear with no wheezing. Respiratory effort normal. Cardiovascular system: S1 & S2 heard, irregular rhythm with normal rate. No murmurs, rubs, gallops or clicks. Gastrointestinal system: Abdomen is protuberant, nondistended, soft and nontender. No organomegaly or masses felt. Normal bowel sounds heard. Central nervous system: Alert and oriented. No focal neurological deficits. Musculoskeletal: Minimal LE edema. No calf tenderness Skin: No cyanosis. No rashes Psychiatry: Judgement and insight appear normal. Mood & affect appropriate.    Data Reviewed: I have personally reviewed following labs and imaging studies  CBC Lab Results  Component Value Date   WBC 7.8 07/31/2020   RBC 4.50 07/31/2020   HGB 14.4 07/31/2020   HCT 42.8 07/31/2020   MCV 95.1 07/31/2020   MCH 32.0 07/31/2020   PLT 160 07/31/2020   MCHC 33.6 07/31/2020   RDW 13.1 07/31/2020   LYMPHSABS 0.5 (L) 07/28/2020   MONOABS 0.9 07/28/2020   EOSABS 0.0 07/28/2020   BASOSABS 0.0 07/28/2020     Last metabolic panel Lab Results  Component Value Date   NA 125 (L) 07/31/2020   K 3.9 07/31/2020   CL 88 (L) 07/31/2020   CO2 29 07/31/2020   BUN 10 07/31/2020   CREATININE 0.48 (L) 07/31/2020   GLUCOSE 109 (H) 07/31/2020   GFRNONAA >60  07/31/2020   CALCIUM 8.6 (L) 07/31/2020   PROT 7.2 07/28/2020   ALBUMIN 4.1 07/28/2020   BILITOT 1.0 07/28/2020   ALKPHOS 102 07/28/2020   AST 19 07/28/2020   ALT 14 07/28/2020   ANIONGAP 8 07/31/2020    CBG (last 3)  No results for input(s): GLUCAP in the last 72 hours.   GFR: Estimated Creatinine Clearance: 96 mL/min (A) (by C-G formula based on SCr of 0.48 mg/dL (L)).  Coagulation Profile: No results for input(s): INR, PROTIME in the last 168 hours.  Recent Results (from the past 240 hour(s))  Resp Panel by RT-PCR (Flu A&B, Covid) Nasopharyngeal Swab     Status: None   Collection Time: 07/28/20 10:51 AM   Specimen: Nasopharyngeal Swab; Nasopharyngeal(NP) swabs in vial transport medium  Result Value Ref Range Status   SARS Coronavirus 2 by RT PCR NEGATIVE NEGATIVE Final    Comment: (NOTE) SARS-CoV-2 target nucleic acids are NOT DETECTED.  The SARS-CoV-2 RNA is generally detectable in upper respiratory specimens during the acute phase of infection. The lowest concentration of SARS-CoV-2 viral copies this assay can detect is 138 copies/mL. A negative result does not preclude SARS-Cov-2 infection and should not be used as the sole basis for treatment or other patient management decisions. A negative result may occur with  improper specimen collection/handling, submission of specimen other than nasopharyngeal swab, presence of viral mutation(s) within the areas targeted by this assay, and inadequate number of viral copies(<138 copies/mL). A negative result must  be combined with clinical observations, patient history, and epidemiological information. The expected result is Negative.  Fact Sheet for Patients:  BloggerCourse.com  Fact Sheet for Healthcare Providers:  SeriousBroker.it  This test is no t yet approved or cleared by the [place] FDA and  has been authorized for detection and/or diagnosis of SARS-CoV-2  by FDA under an Emergency Use Authorization (EUA). This EUA will remain  in effect (meaning this test can be used) for the duration of the COVID-19 declaration under Section 564(b)(1) of the Act, 21 U.S.C.section 360bbb-3(b)(1), unless the authorization is terminated  or revoked sooner.       Influenza A by PCR NEGATIVE NEGATIVE Final   Influenza B by PCR NEGATIVE NEGATIVE Final    Comment: (NOTE) The Xpert Xpress SARS-CoV-2/FLU/RSV plus assay is intended as an aid in the diagnosis of influenza from Nasopharyngeal swab specimens and should not be used as a sole basis for treatment. Nasal washings and aspirates are unacceptable for Xpert Xpress SARS-CoV-2/FLU/RSV testing.  Fact Sheet for Patients: BloggerCourse.com  Fact Sheet for Healthcare Providers: SeriousBroker.it  This test is not yet approved or cleared by the [place] FDA and has been authorized for detection and/or diagnosis of SARS-CoV-2 by FDA under an Emergency Use Authorization (EUA). This EUA will remain in effect (meaning this test can be used) for the duration of the COVID-19 declaration under Section 564(b)(1) of the Act, 21 U.S.C. section 360bbb-3(b)(1), unless the authorization is terminated or revoked.  Performed at [facility], [address] [place], [place] [address]   Blood culture (routine x 2)     Status: None (Preliminary result)   Collection Time: 07/28/20  2:43 PM   Specimen: Left Antecubital; Blood  Result Value Ref Range Status   Specimen Description   Final    LEFT ANTECUBITAL BLOOD Performed at [facility], [address] [place], [place] [address]    Special Requests   Final    BOTTLES DRAWN AEROBIC AND ANAEROBIC Blood Culture adequate volume Performed at [facility], [address] [place], [place] [address]    Culture   Final    NO GROWTH 3 DAYS Performed at [facility], [address], [place], [place] [address]    Report Status PENDING  Incomplete  Blood culture (routine x 2)     Status: None (Preliminary result)   Collection Time: 07/28/20  2:53 PM   Specimen: BLOOD RIGHT HAND  Result Value Ref Range Status   Specimen Description   Final    BLOOD RIGHT HAND BLOOD Performed at [facility], [address] [place], [place] [address]    Special Requests   Final    BOTTLES DRAWN AEROBIC AND ANAEROBIC Blood Culture adequate volume Performed at [facility], [address] [place], [place] [address]    Culture   Final    NO GROWTH 3 DAYS Performed at [facility], [address] [place], [place] [address]    Report Status PENDING  Incomplete  C Difficile Quick Screen w PCR reflex     Status: None   Collection Time: 07/28/20 11:55 PM   Specimen: STOOL  Result Value Ref Range Status   C Diff antigen NEGATIVE NEGATIVE Final   C Diff toxin NEGATIVE NEGATIVE Final   C Diff interpretation No C. difficile detected.  Final    Comment: Performed at [facility], [address] [place], [place] [address]        Radiology Studies: NM Myocar Multi W/Spect Izetta Dakin Motion / EF  Result Date: 07/30/2020 CLINICAL DATA:  Atrial fibrillation. EXAM: MYOCARDIAL IMAGING WITH SPECT (REST AND PHARMACOLOGIC-STRESS) GATED LEFT VENTRICULAR WALL MOTION STUDY LEFT VENTRICULAR EJECTION FRACTION TECHNIQUE: Standard myocardial SPECT imaging was performed after resting intravenous injection of 10.7 mCi Tc-10m tetrofosmin. Subsequently, intravenous infusion of Lexiscan was performed under the supervision of the Cardiology staff. At peak effect of the drug, 31.0 mCi Tc-46m tetrofosmin was injected intravenously and standard myocardial SPECT imaging was performed. Quantitative gated imaging was also performed to evaluate left ventricular wall motion, and estimate left ventricular ejection fraction. COMPARISON:  None. FINDINGS: Perfusion: Fixed defect is seen  involving inferior septal myocardium toward the apex. No definite reversibility is noted. Wall Motion: Normal left ventricular wall motion. No left ventricular dilation. Left Ventricular Ejection Fraction: 65 % End diastolic volume 86 ml End systolic volume 30 ml IMPRESSION: 1. 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 No definite evidence of reversibility is noted to suggest acute ischemia. 2. Normal left ventricular wall motion. 3. Left ventricular ejection fraction 65% 4. Non invasive risk stratification*: Low *2012 Appropriate Use Criteria for Coronary Revascularization Focused Update: J Am Coll Cardiol. 2012;59(9):857-881. http://content.dementiazones.com.aspx?articleid=1201161 Electronically Signed   By: Lupita Raider M.D.   On: 07/30/2020 14:18   ECHOCARDIOGRAM COMPLETE  Result Date: 07/30/2020    ECHOCARDIOGRAM REPORT   Patient Name:   Samuel Barnes Date of Exam: 07/29/2020 Medical Rec #:  [id number]             Height:       68.0 in Accession #:    [id number]            Weight:       231.3 lb Date of Birth:  [birth year]             BSA:          2.174 m Patient Age:    70 years              BP:           139/80 mmHg Patient Gender: M                     HR:           68 bpm. Exam Location:  Inpatient Procedure: 2D Echo, Cardiac Doppler, Color Doppler and Intracardiac            Opacification Agent Indications:    CHF                 Atrial fibrillation  History:        Patient has no prior history of Echocardiogram examinations.                 CAD; Risk Factors:Dyslipidemia, Hypertension and Current Smoker.                 ETOH use.  Sonographer:    Ross Ludwig RDCS (AE) Referring Phys: [id number] DAVID Kelby Fam ORTIZ  Sonographer Comments: Patient is morbidly obese and Technically difficult study due to poor echo windows. Image acquisition challenging due to patient body habitus. IMPRESSIONS  1. Left ventricular ejection fraction, by  estimation, is 55 to 60%. The left ventricle has normal function. The left ventricle has no regional wall motion abnormalities. Left ventricular diastolic function could not be  evaluated.  2. Prominent moderator band of no clinical significance. Right ventricular systolic function is mildly reduced. The right ventricular size is normal. There is mildly elevated pulmonary artery systolic pressure. The estimated right ventricular systolic pressure is 36.5 mmHg.  3. The mitral valve is normal in structure. No evidence of mitral valve regurgitation. No evidence of mitral stenosis.  4. The aortic valve is normal in structure. Aortic valve regurgitation is not visualized. No aortic stenosis is present.  5. The inferior vena cava is dilated in size with <50% respiratory variability, suggesting right atrial pressure of 15 mmHg. FINDINGS  Left Ventricle: Left ventricular ejection fraction, by estimation, is 55 to 60%. The left ventricle has normal function. The left ventricle has no regional wall motion abnormalities. Definity contrast agent was given IV to delineate the left ventricular  endocardial borders. The left ventricular internal cavity size was normal in size. There is no left ventricular hypertrophy. Left ventricular diastolic function could not be evaluated due to atrial fibrillation. Left ventricular diastolic function could  not be evaluated. Normal left ventricular filling pressure. Right Ventricle: Prominent moderator band of no clinical significance. The right ventricular size is normal. No increase in right ventricular wall thickness. Right ventricular systolic function is mildly reduced. There is mildly elevated pulmonary artery  systolic pressure. The tricuspid regurgitant velocity is 2.32 m/s, and with an assumed right atrial pressure of 15 mmHg, the estimated right ventricular systolic pressure is 36.5 mmHg. Left Atrium: Left atrial size was normal in size. Right Atrium: Right atrial size was normal in  size. Pericardium: There is no evidence of pericardial effusion. Mitral Valve: The mitral valve is normal in structure. No evidence of mitral valve regurgitation. No evidence of mitral valve stenosis. Tricuspid Valve: The tricuspid valve is normal in structure. Tricuspid valve regurgitation is not demonstrated. No evidence of tricuspid stenosis. Aortic Valve: The aortic valve is normal in structure. Aortic valve regurgitation is not visualized. No aortic stenosis is present. Aortic valve mean gradient measures 2.0 mmHg. Aortic valve peak gradient measures 3.7 mmHg. Aortic valve area, by VTI measures 2.22 cm. Pulmonic Valve: The pulmonic valve was normal in structure. Pulmonic valve regurgitation is not visualized. No evidence of pulmonic stenosis. Aorta: The aortic root is normal in size and structure. Venous: The inferior vena cava is dilated in size with less than 50% respiratory variability, suggesting right atrial pressure of 15 mmHg. IAS/Shunts: No atrial level shunt detected by color flow Doppler.  LEFT VENTRICLE PLAX 2D LVIDd:         4.40 cm  Diastology LVIDs:         4.00 cm  LV e' medial:    11.40 cm/s LV PW:         1.20 cm  LV E/e' medial:  8.2 LV IVS:        0.80 cm  LV e' lateral:   11.30 cm/s LVOT diam:     2.00 cm  LV E/e' lateral: 8.3 LV SV:         45 LV SV Index:   21 LVOT Area:     3.14 cm  RIGHT VENTRICLE          IVC RV Basal diam:  3.40 cm  IVC diam: 3.20 cm LEFT ATRIUM             Index       RIGHT ATRIUM           Index LA diam:  5.30 cm 2.44 cm/m  RA Area:     24.00 cm LA Vol (A2C):   89.3 ml 41.08 ml/m RA Volume:   68.80 ml  31.65 ml/m LA Vol (A4C):   63.1 ml 29.03 ml/m LA Biplane Vol: 77.9 ml 35.83 ml/m  AORTIC VALVE AV Area (Vmax):    2.13 cm AV Area (Vmean):   2.22 cm AV Area (VTI):     2.22 cm AV Vmax:           96.40 cm/s AV Vmean:          62.000 cm/s AV VTI:            0.202 m AV Peak Grad:      3.7 mmHg AV Mean Grad:      2.0 mmHg LVOT Vmax:         65.50 cm/s LVOT  Vmean:        43.900 cm/s LVOT VTI:          0.143 m LVOT/AV VTI ratio: 0.71  AORTA Ao Root diam: 3.30 cm MITRAL VALVE               TRICUSPID VALVE MV Area (PHT): 3.53 cm    TR Peak grad:   21.5 mmHg MV Decel Time: 215 msec    TR Vmax:        232.00 cm/s MV E velocity: 93.80 cm/s                            SHUNTS                            Systemic VTI:  0.14 m                            Systemic Diam: 2.00 cm Armanda Magicraci Turner MD Electronically signed by Armanda Magicraci Turner MD Signature Date/Time: 07/30/2020/9:43:47 AM    Final         Scheduled Meds:  FLUoxetine  20 mg Oral QHS   folic acid  1 mg Oral Daily   furosemide  40 mg Intravenous BID   losartan  25 mg Oral Daily   metoprolol tartrate  50 mg Oral BID   montelukast  10 mg Oral Daily   multivitamin with minerals  1 tablet Oral Daily   potassium chloride  20 mEq Oral Daily   pravastatin  20 mg Oral q1800   terazosin  5 mg Oral Daily   thiamine  100 mg Oral Daily   Continuous Infusions:  heparin 1,800 Units/hr (07/31/20 0527)     LOS: 3 days     Jacquelin Hawkingalph Thaer Miyoshi, MD [facility] 07/31/2020, 10:09 AM  If 7PM-7AM, please contact night-coverage [email or web address]

## 2020-07-31 NOTE — Plan of Care (Signed)
S/P thoracentesis today with mild SOB and shoulder pain now (see MAR for PRNs).   Problem: Education: Goal: Ability to demonstrate management of disease process will improve Outcome: Progressing   Problem: Activity: Goal: Capacity to carry out activities will improve Outcome: Progressing   Problem: Cardiac: Goal: Ability to achieve and maintain adequate cardiopulmonary perfusion will improve Outcome: Progressing   Problem: Education: Goal: Knowledge of disease or condition will improve Outcome: Progressing Goal: Understanding of medication regimen will improve Outcome: Progressing Goal: Individualized Educational Video(s) Outcome: Progressing   Problem: Activity: Goal: Ability to tolerate increased activity will improve Outcome: Progressing   Problem: Cardiac: Goal: Ability to achieve and maintain adequate cardiopulmonary perfusion will improve Outcome: Progressing   Problem: Health Behavior/Discharge Planning: Goal: Ability to safely manage health-related needs after discharge will improve Outcome: Progressing   Problem: Education: Goal: Knowledge of General Education information will improve Description: Including pain rating scale, medication(s)/side effects and non-pharmacologic comfort measures Outcome: Progressing   Problem: Health Behavior/Discharge Planning: Goal: Ability to manage health-related needs will improve Outcome: Progressing   Problem: Clinical Measurements: Goal: Ability to maintain clinical measurements within normal limits will improve Outcome: Progressing Goal: Will remain free from infection Outcome: Progressing Goal: Diagnostic test results will improve Outcome: Progressing Goal: Respiratory complications will improve Outcome: Progressing Goal: Cardiovascular complication will be avoided Outcome: Progressing   Problem: Activity: Goal: Risk for activity intolerance will decrease Outcome: Progressing   Problem: Nutrition: Goal:  Adequate nutrition will be maintained Outcome: Progressing   Problem: Coping: Goal: Level of anxiety will decrease Outcome: Progressing   Problem: Elimination: Goal: Will not experience complications related to bowel motility Outcome: Progressing Goal: Will not experience complications related to urinary retention Outcome: Progressing   Problem: Pain Managment: Goal: General experience of comfort will improve Outcome: Progressing   Problem: Safety: Goal: Ability to remain free from injury will improve Outcome: Progressing   Problem: Skin Integrity: Goal: Risk for impaired skin integrity will decrease Outcome: Progressing

## 2020-07-31 NOTE — Progress Notes (Signed)
ANTICOAGULATION CONSULT NOTE - Follow Up Consult  Pharmacy Consult for Heparin Indication: atrial fibrillation  Allergies  Allergen Reactions   Ciprofloxacin Shortness Of Breath and Other (See Comments)    Numbness in extremities    Gabapentin Other (See Comments)    Mental problems, depression, anger    Patient Measurements: Height: 5\' 8"  (172.7 cm) Weight: 97.7 kg (215 lb 6.4 oz) IBW/kg (Calculated) : 68.4 Heparin Dosing Weight: 91.3 kg  Vital Signs: Temp: 97.6 F (36.4 C) (07/25 1055) Temp Source: Oral (07/25 1055) BP: 138/88 (07/25 1055) Pulse Rate: 63 (07/25 1055)  Labs: Recent Labs    07/29/20 0453 07/29/20 0803 07/30/20 0555 07/30/20 1418 07/31/20 0347 07/31/20 1037  HGB 14.9  --  14.8  --  14.4  --   HCT 42.5  --  42.2  --  42.8  --   PLT 205  --  176  --  160  --   HEPARINUNFRC  --    < > 0.31  --  0.21* 0.49  CREATININE 0.51*   < > 0.49* 0.59* 0.48*  --    < > = values in this interval not displayed.     Estimated Creatinine Clearance: 96 mL/min (A) (by C-G formula based on SCr of 0.48 mg/dL (L)).  Assessment: 59 YOM presented with fatigue, SOB, with new onset afib. He was not previously on anticoagulation. CBC stable, Hgb 14.4, plts 160. No signs or symptoms of bleeding noted. Heparin level therapeutic at 0.49 today.  Goal of Therapy:  Heparin level 0.3-0.7 units/ml Monitor platelets by anticoagulation protocol: Yes   Plan:  Continue heparin IV 1800 units/hr Monitor H/H, signs and symptoms of bleeding, and platelets Daily HL and CBC F/u oral AC plan   Thank you for allowing 62 to participate in this patients care. [place], PharmD 07/31/2020 1:11 PM  Please check [email or web address] for unit-specific pharmacy phone numbers.

## 2020-08-01 ENCOUNTER — Inpatient Hospital Stay (HOSPITAL_COMMUNITY): Payer: Medicare Other

## 2020-08-01 DIAGNOSIS — I5031 Acute diastolic (congestive) heart failure: Secondary | ICD-10-CM | POA: Diagnosis not present

## 2020-08-01 DIAGNOSIS — J9601 Acute respiratory failure with hypoxia: Secondary | ICD-10-CM | POA: Diagnosis not present

## 2020-08-01 DIAGNOSIS — I4891 Unspecified atrial fibrillation: Secondary | ICD-10-CM | POA: Diagnosis not present

## 2020-08-01 DIAGNOSIS — I700 Atherosclerosis of aorta: Secondary | ICD-10-CM | POA: Diagnosis not present

## 2020-08-01 DIAGNOSIS — I2510 Atherosclerotic heart disease of native coronary artery without angina pectoris: Secondary | ICD-10-CM | POA: Diagnosis not present

## 2020-08-01 LAB — CBC
HCT: 42.7 % (ref 39.0–52.0)
Hemoglobin: 14.7 g/dL (ref 13.0–17.0)
MCH: 32.9 pg (ref 26.0–34.0)
MCHC: 34.4 g/dL (ref 30.0–36.0)
MCV: 95.5 fL (ref 80.0–100.0)
Platelets: 186 10*3/uL (ref 150–400)
RBC: 4.47 MIL/uL (ref 4.22–5.81)
RDW: 13 % (ref 11.5–15.5)
WBC: 9.2 10*3/uL (ref 4.0–10.5)
nRBC: 0 % (ref 0.0–0.2)

## 2020-08-01 LAB — BASIC METABOLIC PANEL
Anion gap: 9 (ref 5–15)
BUN: 15 mg/dL (ref 8–23)
CO2: 31 mmol/L (ref 22–32)
Calcium: 8.8 mg/dL — ABNORMAL LOW (ref 8.9–10.3)
Chloride: 89 mmol/L — ABNORMAL LOW (ref 98–111)
Creatinine, Ser: 0.66 mg/dL (ref 0.61–1.24)
GFR, Estimated: >60 mL/min (ref >60)
Glucose, Bld: 103 mg/dL — ABNORMAL HIGH (ref 70–99)
Potassium: 3.7 mmol/L (ref 3.5–5.1)
Sodium: 129 mmol/L — ABNORMAL LOW (ref 135–145)

## 2020-08-01 LAB — CYTOLOGY - NON PAP

## 2020-08-01 LAB — HEPARIN LEVEL (UNFRACTIONATED): Heparin Unfractionated: 0.35 IU/mL (ref 0.30–0.70)

## 2020-08-01 NOTE — Progress Notes (Signed)
PROGRESS NOTE    [facility]  AYT:[id number] DOB: Mar 03, 1950 DOA: 07/28/2020 PCP: Oneita Hurt, No   Brief Narrative: Samuel Barnes is a 70 y.o. male with a history of aortic atherosclerosis, BPH, class II obesity, coronary artery calcification, depression, hyperlipidemia, hypertension, alcohol and tobacco use. Patient presented secondary to shortness of breath with evidence of acute heart failure, new onset atrial fibrillation and pleural effusions requiring oxygen. Started on IV lasix, IV heparin. Cardiology consulted on admission. Patient is improving with diuresis. Thoracentesis performed with improvement as well. DCCV with cardioversion planned for today for atrial fibrillation   Assessment & Plan:   Principal Problem:   Acute respiratory failure with hypoxia (HCC) Active Problems:   Pericardial effusion   Aortic atherosclerosis (HCC)   Coronary artery calcification   Class 2 obesity   Hyperlipidemia   Tobacco use   BPH (benign prostatic hyperplasia)   Depression   Hypertension   Hyponatremia   Hypomagnesemia   New onset atrial fibrillation (HCC)   Prolonged QT interval   Hypocalcemia   Alcohol abuse   Fatty liver   Hyperglycemia   New onset a-fib (HCC)   Acute congestive heart failure (HCC)   Acute respiratory failure with hypoxia CT imaging significant for moderate right pleural effusion/small left pleural effusion. Patient has required up to 8 lpm of oxygen. -Wean to room air  Right pleural effusion Possibly secondary to heart failure. Thoracentesis performed yesterday; 1.5 L removed. Light's criteria suggests possible exudative fluid.  -Pleural fluid culture/cytology pending -May need repeat thoracentesis  Acute heart failure No prior history. Troponin of 2>2. Imaging significant for pericardial and pleural effusions. Patient hypervolemic on admission and started on Lasix IV. Cardiology consulted. Transthoracic Echocardiogram significant for normal  EF, however LV diastolic function could not be evaluated; left atrium normal in size -Continue Lasix IV  Paroxysmal atrial fibrillation New onset. Currently rate controlled. CHA2DS2-VASc Score is 4. Patient is on metoprolol tartrate as an outpatient, which was resumed. Patient started on heparin IV on admission. Cardiology consulted as mentioned above. -Continue heparin IV -Plan for Transesophageal Echocardiogram/DCCV today  Hyponatremia Sodium of 124 on admission. Running diagnosis of hypervolemic hyponatremia. Patient started on fluid/sodium restricted diet in addition to Lasix IV. Improving with diuresis -Continue Lasix  Primary hypertension Patient is on amlodipine and metoprolol as an outpatient. Amlodipine held on admission. Blood pressure is slightly uncontrolled. -Continue metoprolol, losartan  Coronary artery calcification Hyperlipidemia Seen on imaging. Patient is on lovastatin as an outpatient already. -Continue lovastatin (pravastatin while inpatient)  BPH -Continue terazosin  Depression -Continue Zoloft  Hyperglycemia Prediabetes Mild. Fasting CBG not in the range for diabetes. Hemoglobin A1C of 5.7%. Can consider metformin as an outpatient.  Hypocalcemia Borderline. Stable.  Hypomagnesemia Given supplementation. Resolved.  Alcohol use Noted. Does not seem to abuse alcohol per history.  Fatty liver Seen on imaging. Likely related to alcohol consumption but patient also has obesity.  Tobacco use Smoking cessation discussed on admission. Patient decline nicotine replacement therapy. No diagnosis of COPD, but patient has a long smoking history. -Continue Duoneb prn -Recommended outpatient PFT   DVT prophylaxis: Heparin IV Code Status:   Code Status: Full Code Family Communication: Daughter at bedside Disposition Plan: Discharge likely home in several days pending improvement of respiratory status, weaning oxygen to room air, improvement of heart failure,  cardiology recommendations/management   Consultants:  Cardiology  Procedures:  None  Antimicrobials: Ceftriaxone Azithromycin    Subjective: Significant dyspnea last night which has improved significantly. Cough  since thoracentesis. Anticipating cardioversion.  Objective: Vitals:   08/01/20 0500 08/01/20 0508 08/01/20 1118 08/01/20 1340  BP:  113/69    Pulse:  70 94   Resp: 13 18 20    Temp:  97.8 F (36.6 C)    TempSrc:      SpO2:  97% 94% 95%  Weight:  97.1 kg    Height:        Intake/Output Summary (Last 24 hours) at 08/01/2020 1356 Last data filed at 08/01/2020 0400 Gross per 24 hour  Intake 1281.41 ml  Output 825 ml  Net 456.41 ml    Filed Weights   07/30/20 0515 07/31/20 0500 08/01/20 0508  Weight: 100.5 kg 97.7 kg 97.1 kg    Examination:  General exam: Appears calm and comfortable and in no acute distress. Conversant Respiratory: Diminished to auscultation with rales noted. Respiratory effort normal with no intercostal retractions or use of accessory muscles Cardiovascular: S1 & S2 heard, Irregular rhythm with normal rate. No murmurs, rubs, gallops or clicks. Trace LE edema Gastrointestinal: Abdomen is non-distended, soft and non-tender. No masses felt. Normal bowel sounds heard Neurologic: No focal neurological deficits Musculoskeletal: No calf tenderness Skin: No cyanosis. No new rashes Psychiatry: Alert and oriented. Memory intact. Mood & affect appropriate   Data Reviewed: I have personally reviewed following labs and imaging studies  CBC Lab Results  Component Value Date   WBC 9.2 08/01/2020   RBC 4.47 08/01/2020   HGB 14.7 08/01/2020   HCT 42.7 08/01/2020   MCV 95.5 08/01/2020   MCH 32.9 08/01/2020   PLT 186 08/01/2020   MCHC 34.4 08/01/2020   RDW 13.0 08/01/2020   LYMPHSABS 0.5 (L) 07/28/2020   MONOABS 0.9 07/28/2020   EOSABS 0.0 07/28/2020   BASOSABS 0.0 07/28/2020     Last metabolic panel Lab Results  Component Value Date    NA 129 (L) 08/01/2020   K 3.7 08/01/2020   CL 89 (L) 08/01/2020   CO2 31 08/01/2020   BUN 15 08/01/2020   CREATININE 0.66 08/01/2020   GLUCOSE 103 (H) 08/01/2020   GFRNONAA >60 08/01/2020   CALCIUM 8.8 (L) 08/01/2020   PROT 6.4 (L) 07/31/2020   ALBUMIN 4.1 07/28/2020   BILITOT 1.0 07/28/2020   ALKPHOS 102 07/28/2020   AST 19 07/28/2020   ALT 14 07/28/2020   ANIONGAP 9 08/01/2020    CBG (last 3)  No results for input(s): GLUCAP in the last 72 hours.   GFR: Estimated Creatinine Clearance: 95.7 mL/min (by C-G formula based on SCr of 0.66 mg/dL).  Coagulation Profile: No results for input(s): INR, PROTIME in the last 168 hours.  Recent Results (from the past 240 hour(s))  Resp Panel by RT-PCR (Flu A&B, Covid) Nasopharyngeal Swab     Status: None   Collection Time: 07/28/20 10:51 AM   Specimen: Nasopharyngeal Swab; Nasopharyngeal(NP) swabs in vial transport medium  Result Value Ref Range Status   SARS Coronavirus 2 by RT PCR NEGATIVE NEGATIVE Final    Comment: (NOTE) SARS-CoV-2 target nucleic acids are NOT DETECTED.  The SARS-CoV-2 RNA is generally detectable in upper respiratory specimens during the acute phase of infection. The lowest concentration of SARS-CoV-2 viral copies this assay can detect is 138 copies/mL. A negative result does not preclude SARS-Cov-2 infection and should not be used as the sole basis for treatment or other patient management decisions. A negative result may occur with  improper specimen collection/handling, submission of specimen other than nasopharyngeal swab, presence of viral mutation(s) within the  areas targeted by this assay, and inadequate number of viral copies(<138 copies/mL). A negative result must be combined with clinical observations, patient history, and epidemiological information. The expected result is Negative.  Fact Sheet for Patients:  BloggerCourse.com  Fact Sheet for Healthcare Providers:   SeriousBroker.it  This test is no t yet approved or cleared by the [place] FDA and  has been authorized for detection and/or diagnosis of SARS-CoV-2 by FDA under an Emergency Use Authorization (EUA). This EUA will remain  in effect (meaning this test can be used) for the duration of the COVID-19 declaration under Section 564(b)(1) of the Act, 21 U.S.C.section 360bbb-3(b)(1), unless the authorization is terminated  or revoked sooner.       Influenza A by PCR NEGATIVE NEGATIVE Final   Influenza B by PCR NEGATIVE NEGATIVE Final    Comment: (NOTE) The Xpert Xpress SARS-CoV-2/FLU/RSV plus assay is intended as an aid in the diagnosis of influenza from Nasopharyngeal swab specimens and should not be used as a sole basis for treatment. Nasal washings and aspirates are unacceptable for Xpert Xpress SARS-CoV-2/FLU/RSV testing.  Fact Sheet for Patients: BloggerCourse.com  Fact Sheet for Healthcare Providers: SeriousBroker.it  This test is not yet approved or cleared by the [place] FDA and has been authorized for detection and/or diagnosis of SARS-CoV-2 by FDA under an Emergency Use Authorization (EUA). This EUA will remain in effect (meaning this test can be used) for the duration of the COVID-19 declaration under Section 564(b)(1) of the Act, 21 U.S.C. section 360bbb-3(b)(1), unless the authorization is terminated or revoked.  Performed at [facility], [address] [place], [place] [address]   Blood culture (routine x 2)     Status: None (Preliminary result)   Collection Time: 07/28/20  2:43 PM   Specimen: Left Antecubital; Blood  Result Value Ref Range Status   Specimen Description   Final    LEFT ANTECUBITAL BLOOD Performed at [facility], [address] [place], [place] [address]    Special Requests   Final    BOTTLES DRAWN AEROBIC AND ANAEROBIC Blood Culture  adequate volume Performed at [facility], [address] [place], [place] [address]    Culture   Final    NO GROWTH 4 DAYS Performed at [facility], [address], [place], [place] [address]    Report Status PENDING  Incomplete  Blood culture (routine x 2)     Status: None (Preliminary result)   Collection Time: 07/28/20  2:53 PM   Specimen: BLOOD RIGHT HAND  Result Value Ref Range Status   Specimen Description   Final    BLOOD RIGHT HAND BLOOD Performed at [facility], [address] [place], [place] [address]    Special Requests   Final    BOTTLES DRAWN AEROBIC AND ANAEROBIC Blood Culture adequate volume Performed at [facility], [address] [place], [place] [address]    Culture   Final    NO GROWTH 4 DAYS Performed at [facility], [address], [place], [place] [address]    Report Status PENDING  Incomplete  C Difficile Quick Screen w PCR reflex     Status: None   Collection Time: 07/28/20 11:55 PM   Specimen: STOOL  Result Value Ref Range Status   C Diff antigen NEGATIVE NEGATIVE Final   C Diff toxin NEGATIVE NEGATIVE Final   C Diff interpretation No  C. difficile detected.  Final    Comment: Performed at [facility], [address] [place], [place] [address]  Body fluid culture w Gram Stain     Status: None (Preliminary result)   Collection Time: 07/31/20  4:16 PM   Specimen: Lung, Right; Pleural Fluid  Result Value Ref Range Status   Specimen Description PLEURAL FLUID  Final   Special Requests LUNG RIGHT  Final   Gram Stain   Final    FEW WBC PRESENT, PREDOMINANTLY MONONUCLEAR NO ORGANISMS SEEN    Culture   Final    NO GROWTH < 24 HOURS Performed at [facility], [address] [place], [place] [address]    Report Status PENDING  Incomplete        Radiology Studies: DG Chest 1 View  Result Date: 07/31/2020 CLINICAL DATA:  70 year old male status post right-sided thoracentesis. EXAM:  CHEST  1 VIEW COMPARISON:  Chest radiograph dated 07/31/2020. FINDINGS: Slight interval decrease in the size of the right pleural effusion. Trace right pleural effusion and right lung base atelectasis/infiltrate remains. No pneumothorax. The left lung is clear. Stable cardiomegaly. Atherosclerotic calcification of the aortic arch. No acute osseous pathology. IMPRESSION: Slight interval decrease in the size of the right pleural effusion. No pneumothorax. Electronically Signed   By: Elgie Collard M.D.   On: 07/31/2020 16:37   DG CHEST PORT 1 VIEW  Result Date: 08/01/2020 CLINICAL DATA:  Shortness of breath.  Prior thoracentesis. EXAM: PORTABLE CHEST 1 VIEW COMPARISON:  07/31/2020. FINDINGS: Mediastinum hilar structures normal. Cardiomegaly with pulmonary venous congestion again noted. Low lung volumes with persistent bibasilar atelectasis. Small right pleural effusion, decreased in size after prior right thoracentesis. No pneumothorax. Old right rib fractures. IMPRESSION: 1. Cardiomegaly with pulmonary venous congestion again noted. Low lung volumes with bibasilar atelectasis again noted. 2. Small right pleural effusion, decreased in size after prior thoracentesis. No pneumothorax. Electronically Signed   By: Maisie Fus  Register   On: 08/01/2020 08:03   DG CHEST PORT 1 VIEW  Result Date: 07/31/2020 CLINICAL DATA:  Shortness of breath, right-sided chest pain, status post thoracentesis today EXAM: PORTABLE CHEST 1 VIEW COMPARISON:  07/31/2020 FINDINGS: Unchanged AP portable chest radiograph. Cardiomegaly. Layering right pleural effusion with associated atelectasis or consolidation. No pneumothorax. No new airspace opacity. Redemonstrated chronic fracture deformities of the right ribs. IMPRESSION: 1.  Unchanged AP portable chest radiograph. 2. Layering right pleural effusion with associated atelectasis or consolidation. 3.  No pneumothorax. No new airspace opacity. 4.  Cardiomegaly. Electronically Signed   By:  Lauralyn Primes M.D.   On: 07/31/2020 20:35   DG CHEST PORT 1 VIEW  Result Date: 07/31/2020 CLINICAL DATA:  Hypoxia and shortness breath EXAM: PORTABLE CHEST 1 VIEW COMPARISON:  CT chest 07/28/2020 FINDINGS: Hazy obscuration of the right lung base compatible with known right pleural effusion and right lower lobe atelectasis, likely with a component of right middle lobe atelectasis as shown on CT from 07/28/2020. Moderate cardiomegaly is present. Atherosclerotic calcification of the aortic arch. Old healed right rib fractures. The left lung remains clear. IMPRESSION: 1. Similar appearance of right pleural effusion and opacification of the right lung base due to atelectasis of the right lower lobe and part of the right middle lobe. 2. Moderate cardiomegaly. 3.  Aortic Atherosclerosis (ICD10-I70.0). Electronically Signed   By: Gaylyn Rong M.D.   On: 07/31/2020 13:28   IR THORACENTESIS ASP PLEURAL SPACE W/IMG GUIDE  Result Date: 07/31/2020 INDICATION: Patient presents today with  shortness of breath and right pleural effusion. Interventional radiology asked to perform a diagnostic and therapeutic thoracentesis. EXAM: ULTRASOUND GUIDED THORACENTESIS MEDICATIONS: 1% lidocaine 10 mL COMPLICATIONS: None immediate. PROCEDURE: An ultrasound guided thoracentesis was thoroughly discussed with the patient and questions answered. The benefits, risks, alternatives and complications were also discussed. The patient understands and wishes to proceed with the procedure. Written consent was obtained. Ultrasound was performed to localize and mark an adequate pocket of fluid in the right chest. The area was then prepped and draped in the normal sterile fashion. 1% Lidocaine was used for local anesthesia. Under ultrasound guidance a 6 Fr Safe-T-Centesis catheter was introduced. Thoracentesis was performed. The catheter was removed and a dressing applied. FINDINGS: A total of approximately 1.5 L of amber-colored fluid was  removed. Samples were sent to the laboratory as requested by the clinical team. IMPRESSION: Successful ultrasound guided right thoracentesis yielding 1.5 L of pleural fluid. Read by: Alwyn Ren, NP Electronically Signed   By: Marliss Coots MD   On: 07/31/2020 16:35        Scheduled Meds:  FLUoxetine  20 mg Oral QHS   folic acid  1 mg Oral Daily   furosemide  40 mg Intravenous BID   losartan  50 mg Oral Daily   metoprolol tartrate  50 mg Oral BID   montelukast  10 mg Oral Daily   multivitamin with minerals  1 tablet Oral Daily   potassium chloride  20 mEq Oral Daily   pravastatin  20 mg Oral q1800   terazosin  5 mg Oral Daily   thiamine  100 mg Oral Daily   Continuous Infusions:  heparin 1,800 Units/hr (07/31/20 0527)     LOS: 4 days     Jacquelin Hawking, MD [facility] 08/01/2020, 1:56 PM  If 7PM-7AM, please contact night-coverage [email or web address]

## 2020-08-01 NOTE — Progress Notes (Signed)
Progress Note  Patient Name: Samuel Barnes Date of Encounter: 08/01/2020  [facility] Cardiologist: Armanda Magic, MD   Subjective  2D echo with normal LVF.  No PE on chest CTA.  No ischemia on nuclear stress test  Denies any chest pain or SOB.  No palpitations.  Inpatient Medications    Scheduled Meds:  FLUoxetine  20 mg Oral QHS   folic acid  1 mg Oral Daily   furosemide  40 mg Intravenous BID   losartan  50 mg Oral Daily   metoprolol tartrate  50 mg Oral BID   montelukast  10 mg Oral Daily   multivitamin with minerals  1 tablet Oral Daily   potassium chloride  20 mEq Oral Daily   pravastatin  20 mg Oral q1800   terazosin  5 mg Oral Daily   thiamine  100 mg Oral Daily   Continuous Infusions:  heparin 1,800 Units/hr (07/31/20 0527)   PRN Meds: acetaminophen **OR** acetaminophen, ipratropium-albuterol, lidocaine, prochlorperazine   Vital Signs    Vitals:   08/01/20 0300 08/01/20 0400 08/01/20 0500 08/01/20 0508  BP:    113/69  Pulse:    70  Resp: 13 15 13 18   Temp:    97.8 F (36.6 C)  TempSrc:      SpO2:    97%  Weight:    97.1 kg  Height:        Intake/Output Summary (Last 24 hours) at 08/01/2020 08/03/2020 Last data filed at 08/01/2020 0400 Gross per 24 hour  Intake 1281.41 ml  Output 2025 ml  Net -743.59 ml    Last 3 Weights 08/01/2020 07/31/2020 07/30/2020  Weight (lbs) 214 lb 1.1 oz 215 lb 6.4 oz 221 lb 9 oz  Weight (kg) 97.1 kg 97.705 kg 100.5 kg      Telemetry    Atrial fibrillation  - Personally Reviewed  ECG    No new EKG to review - Personally Reviewed  Physical Exam   GEN: Well nourished, well developed in no acute distress HEENT: Normal NECK: No JVD; No carotid bruits LYMPHATICS: No lymphadenopathy CARDIAC:irregularly irregular, no murmurs, rubs, gallops RESPIRATORY:  Clear to auscultation without rales, wheezing or rhonchi  ABDOMEN: Soft, non-tender, non-distended MUSCULOSKELETAL:  No edema; No deformity  SKIN: Warm and  dry NEUROLOGIC:  Alert and oriented x 3 PSYCHIATRIC:  Normal affect   Labs    High Sensitivity Troponin:   Recent Labs  Lab 07/28/20 1051 07/28/20 1249  TROPONINIHS 2 2       Chemistry Recent Labs  Lab 07/28/20 1051 07/29/20 0453 07/31/20 0347 07/31/20 1734 08/01/20 0042  NA 124*   < > 125* 129* 129*  K 4.3   < > 3.9 3.9 3.7  CL 84*   < > 88* 89* 89*  CO2 30   < > 29 31 31   GLUCOSE 130*   < > 109* 100* 103*  BUN 10   < > 10 11 15   CREATININE 0.50*   < > 0.48* 0.60* 0.66  CALCIUM 8.7*   < > 8.6* 9.2 8.8*  PROT 7.2  --   --  6.4*  --   ALBUMIN 4.1  --   --   --   --   AST 19  --   --   --   --   ALT 14  --   --   --   --   ALKPHOS 102  --   --   --   --  BILITOT 1.0  --   --   --   --   GFRNONAA >60   < > >60 >60 >60  ANIONGAP 10   < > < > = values in this interval not displayed.      Hematology Recent Labs  Lab 07/30/20 0555 07/31/20 0347 08/01/20 0042  WBC 7.2 7.8 9.2  RBC 4.50 4.50 4.47  HGB 14.8 14.4 14.7  HCT 42.2 42.8 42.7  MCV 93.8 95.1 95.5  MCH 32.9 32.0 32.9  MCHC 35.1 33.6 34.4  RDW 13.1 13.1 13.0  PLT 176 160 186     BNP Recent Labs  Lab 07/28/20 1051 07/31/20 1037  BNP 302.6* 245.9*      DDimer  Recent Labs  Lab 07/28/20 1051  DDIMER 1.21*     CHA2DS2-VASc Score = 4  This indicates a 4.8% annual risk of stroke. The patient's score is based upon: CHF History: Yes HTN History: Yes Diabetes History: No Stroke History: No Vascular Disease History: Yes Age Score: 1 Gender Score: 0   Radiology    DG Chest 1 View  Result Date: 07/31/2020 CLINICAL DATA:  70 year old male status post right-sided thoracentesis. EXAM: CHEST  1 VIEW COMPARISON:  Chest radiograph dated 07/31/2020. FINDINGS: Slight interval decrease in the size of the right pleural effusion. Trace right pleural effusion and right lung base atelectasis/infiltrate remains. No pneumothorax. The left lung is clear. Stable cardiomegaly. Atherosclerotic  calcification of the aortic arch. No acute osseous pathology. IMPRESSION: Slight interval decrease in the size of the right pleural effusion. No pneumothorax. Electronically Signed   By: Elgie Collard M.D.   On: 07/31/2020 16:37   NM Myocar Multi W/Spect Izetta Dakin Motion / EF  Result Date: 07/30/2020 CLINICAL DATA:  Atrial fibrillation. EXAM: MYOCARDIAL IMAGING WITH SPECT (REST AND PHARMACOLOGIC-STRESS) GATED LEFT VENTRICULAR WALL MOTION STUDY LEFT VENTRICULAR EJECTION FRACTION TECHNIQUE: Standard myocardial SPECT imaging was performed after resting intravenous injection of 10.7 mCi Tc-38m tetrofosmin. Subsequently, intravenous infusion of Lexiscan was performed under the supervision of the Cardiology staff. At peak effect of the drug, 31.0 mCi Tc-60m tetrofosmin was injected intravenously and standard myocardial SPECT imaging was performed. Quantitative gated imaging was also performed to evaluate left ventricular wall motion, and estimate left ventricular ejection fraction. COMPARISON:  None. FINDINGS: Perfusion: Fixed defect is seen involving inferior septal myocardium toward the apex. No definite reversibility is noted. Wall Motion: Normal left ventricular wall motion. No left ventricular dilation. Left Ventricular Ejection Fraction: 65 % End diastolic volume 86 ml End systolic volume 30 ml IMPRESSION: 1. 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 No definite evidence of reversibility is noted to suggest acute ischemia. 2. Normal left ventricular wall motion. 3. Left ventricular ejection fraction 65% 4. Non invasive risk stratification*: Low *2012 Appropriate Use Criteria for Coronary Revascularization Focused Update: J Am Coll Cardiol. 2012;59(9):857-881. http://content.dementiazones.com.aspx?articleid=[email or web address] Electronically Signed   By: Lupita Raider M.D.   On: 07/30/2020 14:18   DG CHEST PORT 1 VIEW  Result Date: 08/01/2020 CLINICAL  DATA:  Shortness of breath.  Prior thoracentesis. EXAM: PORTABLE CHEST 1 VIEW COMPARISON:  07/31/2020. FINDINGS: Mediastinum hilar structures normal. Cardiomegaly with pulmonary venous congestion again noted. Low lung volumes with persistent bibasilar atelectasis. Small right pleural effusion, decreased in size after prior right thoracentesis. No pneumothorax. Old right rib fractures. IMPRESSION: 1. Cardiomegaly with pulmonary venous congestion again noted. Low lung volumes with bibasilar atelectasis again noted. 2. Small  right pleural effusion, decreased in size after prior thoracentesis. No pneumothorax. Electronically Signed   By: Maisie Fus  Register   On: 08/01/2020 08:03   DG CHEST PORT 1 VIEW  Result Date: 07/31/2020 CLINICAL DATA:  Shortness of breath, right-sided chest pain, status post thoracentesis today EXAM: PORTABLE CHEST 1 VIEW COMPARISON:  07/31/2020 FINDINGS: Unchanged AP portable chest radiograph. Cardiomegaly. Layering right pleural effusion with associated atelectasis or consolidation. No pneumothorax. No new airspace opacity. Redemonstrated chronic fracture deformities of the right ribs. IMPRESSION: 1.  Unchanged AP portable chest radiograph. 2. Layering right pleural effusion with associated atelectasis or consolidation. 3.  No pneumothorax. No new airspace opacity. 4.  Cardiomegaly. Electronically Signed   By: Lauralyn Primes M.D.   On: 07/31/2020 20:35   DG CHEST PORT 1 VIEW  Result Date: 07/31/2020 CLINICAL DATA:  Hypoxia and shortness breath EXAM: PORTABLE CHEST 1 VIEW COMPARISON:  CT chest 07/28/2020 FINDINGS: Hazy obscuration of the right lung base compatible with known right pleural effusion and right lower lobe atelectasis, likely with a component of right middle lobe atelectasis as shown on CT from 07/28/2020. Moderate cardiomegaly is present. Atherosclerotic calcification of the aortic arch. Old healed right rib fractures. The left lung remains clear. IMPRESSION: 1. Similar  appearance of right pleural effusion and opacification of the right lung base due to atelectasis of the right lower lobe and part of the right middle lobe. 2. Moderate cardiomegaly. 3.  Aortic Atherosclerosis (ICD10-I70.0). Electronically Signed   By: Gaylyn Rong M.D.   On: 07/31/2020 13:28   IR THORACENTESIS ASP PLEURAL SPACE W/IMG GUIDE  Result Date: 07/31/2020 INDICATION: Patient presents today with shortness of breath and right pleural effusion. Interventional radiology asked to perform a diagnostic and therapeutic thoracentesis. EXAM: ULTRASOUND GUIDED THORACENTESIS MEDICATIONS: 1% lidocaine 10 mL COMPLICATIONS: None immediate. PROCEDURE: An ultrasound guided thoracentesis was thoroughly discussed with the patient and questions answered. The benefits, risks, alternatives and complications were also discussed. The patient understands and wishes to proceed with the procedure. Written consent was obtained. Ultrasound was performed to localize and mark an adequate pocket of fluid in the right chest. The area was then prepped and draped in the normal sterile fashion. 1% Lidocaine was used for local anesthesia. Under ultrasound guidance a 6 Fr Safe-T-Centesis catheter was introduced. Thoracentesis was performed. The catheter was removed and a dressing applied. FINDINGS: A total of approximately 1.5 L of amber-colored fluid was removed. Samples were sent to the laboratory as requested by the clinical team. IMPRESSION: Successful ultrasound guided right thoracentesis yielding 1.5 L of pleural fluid. Read by: Alwyn Ren, NP Electronically Signed   By: Marliss Coots MD   On: 07/31/2020 16:35    Cardiac Studies   2D echo 07/2020 IMPRESSIONS    1. Left ventricular ejection fraction, by estimation, is 55 to 60%. The  left ventricle has normal function. The left ventricle has no regional  wall motion abnormalities. Left ventricular diastolic function could not  be evaluated.   2. Prominent moderator  band of no clinical significance. Right  ventricular systolic function is mildly reduced. The right ventricular  size is normal. There is mildly elevated pulmonary artery systolic  pressure. The estimated right ventricular systolic  pressure is 36.5 mmHg.   3. The mitral valve is normal in structure. No evidence of mitral valve  regurgitation. No evidence of mitral stenosis.   4. The aortic valve is normal in structure. Aortic valve regurgitation is  not visualized. No aortic stenosis is present.  5. The inferior vena cava is dilated in size with <50% respiratory  variability, suggesting right atrial pressure of 15 mmHg.   Patient Profile     70 y.o. male  with a PMH of coronary artery calcifications, HTN, HLD, BPH s/p TURP, depression, and tobacco abuse,  who is being seen 07/29/2020 for the evaluation of new onset atrial fibrillation at the request of Dr. Caleb Popp.  Assessment & Plan    1. Acute hypoxic respiratory failure likely 2/2 acute CHF: patient presented with several days of worsening SOB, cough, fatigue. He was hypoxic to 87% on arrival which improved with O2 via . EKG with non-specific ST-T wave abnormalities. BNP 302. HsTrop negative x2. Found to have mild-moderate pleural effusions and mild pericardial effusion on CT but negative for PE. He has wheezing and poor air movement on exam.  - High suspicion for underlying COPD +/- OSA which may be contributing to his presentation. - his wheezing has resolved and BS are better with nebulizers - he has no edema on exam but still SOB some - Currently on Lasix 40mg  IV BID - he put out  2L yesterday and is net neg 4.2L since admit - SCr remains stable at 0.66 - down 1lb today and 17lbs overall since admit - Now on Duonebs for wheezing - 2D echo  with normal LV function  - Lexiscan myoview with no ischemia.   - Underwent thoracentesis yesterday of 1.5L amber colored fluid and breathing improved - BNP yesterday slightly improved from  302>245 - Na remains 129 - continue Lopressor 50mg  BID>>plan to transition to Toprol prior to discharge due to likely underlying COPD - continue IV lasix for now - BP improved on high dose of Losartan  2. New onset atrial fibrillation: no prior history. EKG with rate controlled atrial fibrillation. TSH is wnl. He was started on metoprolol tartrate 50mg  BID (on succinate 50mg  daily at home) for rate control and a heparin gtt for stroke ppx. - CHA2DS2-VASc Score = 4 [CHF History: Yes, HTN History: Yes, Diabetes History: No, Stroke History: No, Vascular Disease History: Yes, Age Score: 1, Gender Score: 0].  Therefore, the patient's annual risk of stroke is 4.8 %.    - 2D echo with normal LVF - Consider outpatient sleep study to r/o OSA - plan for TEE/DCCV today as I think this will help with his CHF by restoring sinus   3. Coronary artery calcifications: noted on prior CT scans. Has not had any ischemic testing. Risk factors for obstructive CAD include HTN, HLD, and tobacco abuse.  - echo with normal LVF - Lexiscan myoview with no ischemia - Continue statin and BB - Continue aggressive risk factor modifications - no ASA due to need for DOAC   4. HTN: BP generally elevated. Home amlodipine stopped and he was started on metoprolol tartrate 50mg  BID (succinate 50mg  daily at home) and Lisinopril which was changed to Losartan - BP improved after increasing Losartan - Continue Lopressor 50mg  BID and transition to Toprol at discharge - Continue Losartan 50mg  daily   5. HLD: LDL 46 09/2019 on lovastatin at home and pravastatin this admission (hospital formulary) - LDL 33 and HDL 34 this admit - Continue lovastatin for now - low threshold to transition to high-intensity statin pending above work-up   6. Hyponatremia: Na 124 on admission. Likely hypervolemic hyponatremia. - Na has been 124-128 with diuresis (back down up to 129 today) - Continue to monitor closely with diuresis as above    7.  Tobacco abuse: smokes 1ppd which he has for the past 50 years. Cessation advised. Suspicions high for underlying COPD - Continue to encourage cessation   8. ETOH abuse: drinks 3-4 cocktails 3-4 days per week. Discussed relationship between ETOH abuse and Afib. - Continue to encourage cessation       I have spent a total of 35 minutes with patient reviewing 2D echo , telemetry, EKGs, labs and examining patient as well as establishing an assessment and plan that was discussed with the patient.  > 50% of time was spent in direct patient care.     For questions or updates, please contact [facility] Please consult [email or web address] for contact info under        Signed, Armanda Magicraci Egidio Lofgren, MD  08/01/2020, 9:04 AM

## 2020-08-01 NOTE — Progress Notes (Signed)
ANTICOAGULATION CONSULT NOTE - Follow Up Consult  Pharmacy Consult for Heparin Indication: atrial fibrillation  Allergies  Allergen Reactions   Ciprofloxacin Shortness Of Breath and Other (See Comments)    Numbness in extremities    Gabapentin Other (See Comments)    Mental problems, depression, anger    Patient Measurements: Height: 5\' 8"  (172.7 cm) Weight: 97.1 kg (214 lb 1.1 oz) IBW/kg (Calculated) : 68.4 Heparin Dosing Weight: 91.3 kg  Vital Signs: Temp: 97.8 F (36.6 C) (07/26 0508) BP: 113/69 (07/26 0508) Pulse Rate: 70 (07/26 0508)  Labs: Recent Labs    07/30/20 0555 07/30/20 1418 07/31/20 0347 07/31/20 1037 07/31/20 1734 08/01/20 0042  HGB 14.8  --  14.4  --   --  14.7  HCT 42.2  --  42.8  --   --  42.7  PLT 176  --  160  --   --  186  HEPARINUNFRC 0.31  --  0.21* 0.49  --  0.35  CREATININE 0.49*   < > 0.48*  --  0.60* 0.66   < > = values in this interval not displayed.     Estimated Creatinine Clearance: 95.7 mL/min (by C-G formula based on SCr of 0.66 mg/dL).  Assessment: 30 YOM presented with fatigue, SOB, with new onset afib. He was not previously on anticoagulation. CBC stable within normal. No signs or symptoms of bleeding noted. Heparin level therapeutic at 0.35 today on heparin drip rate 1800 units/hr.  Goal of Therapy:  Heparin level 0.3-0.7 units/ml Monitor platelets by anticoagulation protocol: Yes   Plan:  Continue heparin IV 1800 units/hr Monitor H/H, signs and symptoms of bleeding, and platelets Daily HL and CBC F/u oral AC plan   Thank you for allowing 62 to participate in this patients care. [place], RPh Clinical Pharmacist [phone] 08/01/2020 9:47 AM  Please check [email or web address] for unit-specific pharmacy phone numbers.

## 2020-08-02 ENCOUNTER — Encounter (HOSPITAL_COMMUNITY)
Admission: EM | Disposition: A | Discharge status: Home Health | Payer: Self-pay | Source: Home / Self Care | Attending: Internal Medicine

## 2020-08-02 ENCOUNTER — Inpatient Hospital Stay (HOSPITAL_COMMUNITY)
Admit: 2020-08-02 | Discharge: 2020-08-02 | Disposition: A | Discharge status: Home / Self Care | Payer: Medicare Other | Attending: Medical | Admitting: Medical

## 2020-08-02 ENCOUNTER — Inpatient Hospital Stay (HOSPITAL_COMMUNITY): Payer: Medicare Other | Admitting: Certified Registered Nurse Anesthetist

## 2020-08-02 ENCOUNTER — Encounter (HOSPITAL_COMMUNITY): Payer: Self-pay | Admitting: Internal Medicine

## 2020-08-02 DIAGNOSIS — I340 Nonrheumatic mitral (valve) insufficiency: Secondary | ICD-10-CM | POA: Diagnosis not present

## 2020-08-02 DIAGNOSIS — I5031 Acute diastolic (congestive) heart failure: Secondary | ICD-10-CM | POA: Diagnosis not present

## 2020-08-02 DIAGNOSIS — I4891 Unspecified atrial fibrillation: Secondary | ICD-10-CM | POA: Diagnosis not present

## 2020-08-02 DIAGNOSIS — I2510 Atherosclerotic heart disease of native coronary artery without angina pectoris: Secondary | ICD-10-CM | POA: Diagnosis not present

## 2020-08-02 DIAGNOSIS — J9601 Acute respiratory failure with hypoxia: Secondary | ICD-10-CM | POA: Diagnosis not present

## 2020-08-02 DIAGNOSIS — I700 Atherosclerosis of aorta: Secondary | ICD-10-CM | POA: Diagnosis not present

## 2020-08-02 DIAGNOSIS — K760 Fatty (change of) liver, not elsewhere classified: Secondary | ICD-10-CM

## 2020-08-02 DIAGNOSIS — N400 Enlarged prostate without lower urinary tract symptoms: Secondary | ICD-10-CM

## 2020-08-02 HISTORY — PX: CARDIOVERSION: SHX1299

## 2020-08-02 HISTORY — PX: TEE WITHOUT CARDIOVERSION: SHX5443

## 2020-08-02 LAB — CBC
HCT: 41.6 % (ref 39.0–52.0)
Hemoglobin: 14 g/dL (ref 13.0–17.0)
MCH: 32.6 pg (ref 26.0–34.0)
MCHC: 33.7 g/dL (ref 30.0–36.0)
MCV: 96.7 fL (ref 80.0–100.0)
Platelets: 178 10*3/uL (ref 150–400)
RBC: 4.3 MIL/uL (ref 4.22–5.81)
RDW: 13.1 % (ref 11.5–15.5)
WBC: 8 10*3/uL (ref 4.0–10.5)
nRBC: 0 % (ref 0.0–0.2)

## 2020-08-02 LAB — CULTURE, BLOOD (ROUTINE X 2)
Culture: NO GROWTH
Culture: NO GROWTH
Special Requests: ADEQUATE
Special Requests: ADEQUATE

## 2020-08-02 LAB — BASIC METABOLIC PANEL
Anion gap: 9 (ref 5–15)
BUN: 11 mg/dL (ref 8–23)
CO2: 29 mmol/L (ref 22–32)
Calcium: 8.6 mg/dL — ABNORMAL LOW (ref 8.9–10.3)
Chloride: 91 mmol/L — ABNORMAL LOW (ref 98–111)
Creatinine, Ser: 0.51 mg/dL — ABNORMAL LOW (ref 0.61–1.24)
GFR, Estimated: >60 mL/min (ref >60)
Glucose, Bld: 98 mg/dL (ref 70–99)
Potassium: 3.7 mmol/L (ref 3.5–5.1)
Sodium: 129 mmol/L — ABNORMAL LOW (ref 135–145)

## 2020-08-02 LAB — HEPARIN LEVEL (UNFRACTIONATED): Heparin Unfractionated: 0.32 IU/mL (ref 0.30–0.70)

## 2020-08-02 SURGERY — ECHOCARDIOGRAM, TRANSESOPHAGEAL
Anesthesia: Monitor Anesthesia Care

## 2020-08-02 MED ORDER — MAGNESIUM SULFATE 2 GM/50ML IV SOLN
2.0000 g | Freq: Once | INTRAVENOUS | Status: AC
  Administered 2020-08-02: 2 g via INTRAVENOUS
  Filled 2020-08-02: qty 50

## 2020-08-02 MED ORDER — PROPOFOL 10 MG/ML IV BOLUS
INTRAVENOUS | Status: DC | PRN
Start: 2020-08-02 — End: 2020-08-02
  Administered 2020-08-02 (×2): 30 mg via INTRAVENOUS

## 2020-08-02 MED ORDER — SODIUM CHLORIDE 0.9 % IV SOLN
INTRAVENOUS | Status: AC | PRN
  Administered 2020-08-02: 500 mL via INTRAVENOUS

## 2020-08-02 MED ORDER — PROPOFOL 500 MG/50ML IV EMUL
INTRAVENOUS | Status: DC | PRN
  Administered 2020-08-02: 100 ug/kg/min via INTRAVENOUS

## 2020-08-02 MED ORDER — LIDOCAINE 2% (20 MG/ML) 5 ML SYRINGE
INTRAMUSCULAR | Status: DC | PRN
  Administered 2020-08-02: 30 mg via INTRAVENOUS

## 2020-08-02 MED ORDER — PHENYLEPHRINE 40 MCG/ML (10ML) SYRINGE FOR IV PUSH (FOR BLOOD PRESSURE SUPPORT)
PREFILLED_SYRINGE | INTRAVENOUS | Status: DC | PRN
  Administered 2020-08-02 (×2): 80 ug via INTRAVENOUS

## 2020-08-02 MED FILL — Sodium Chloride IV Soln 0.9%: INTRAVENOUS | Qty: 250 | Status: AC

## 2020-08-02 MED FILL — Heparin Sodium (Porcine) Inj 5000 Unit/ML: INTRAMUSCULAR | Qty: 5 | Status: AC

## 2020-08-02 NOTE — Transfer of Care (Signed)
Immediate Anesthesia Transfer of Care Note  Patient: Bhavesh Vazquez [facility]  Procedure(s) Performed: TRANSESOPHAGEAL ECHOCARDIOGRAM (TEE) CARDIOVERSION  Patient Location: PACU  Anesthesia Type:MAC  Level of Consciousness: awake and alert   Airway & Oxygen Therapy: Patient Spontanous Breathing and Patient connected to nasal cannula oxygen  Post-op Assessment: Report given to RN and Post -op Vital signs reviewed and stable  Post vital signs: Reviewed and stable  Last Vitals:  Vitals Value Taken Time  BP 109/66 08/02/20 1600  Temp 36.4 C 08/02/20 1558  Pulse 70 08/02/20 1603  Resp 18 08/02/20 1603  SpO2 92 % 08/02/20 1603  Vitals shown include unvalidated device data.  Last Pain:  Vitals:   08/02/20 1600  TempSrc:   PainSc: 0-No pain         Complications: No notable events documented.

## 2020-08-02 NOTE — Anesthesia Procedure Notes (Signed)
Procedure Name: MAC Date/Time: 08/02/2020 3:29 PM Performed by: Reece Agar, CRNA Pre-anesthesia Checklist: Patient identified, Emergency Drugs available, Suction available, Patient being monitored and Timeout performed Patient Re-evaluated:Patient Re-evaluated prior to induction Oxygen Delivery Method: Nasal cannula

## 2020-08-02 NOTE — Anesthesia Preprocedure Evaluation (Signed)
Anesthesia Evaluation  Patient identified by MRN, date of birth, ID band Patient awake    Reviewed: Allergy & Precautions, NPO status , Patient's Chart, lab work & pertinent test results  Airway Mallampati: II  TM Distance: >3 FB Neck ROM: Full    Dental no notable dental hx.    Pulmonary neg pulmonary ROS, Current Smoker and Patient abstained from smoking.,    Pulmonary exam normal breath sounds clear to auscultation       Cardiovascular hypertension, + CAD  Normal cardiovascular exam Rhythm:Regular Rate:Normal     Neuro/Psych PSYCHIATRIC DISORDERS Depression negative neurological ROS     GI/Hepatic negative GI ROS, Neg liver ROS,   Endo/Other  negative endocrine ROS  Renal/GU negative Renal ROS  negative genitourinary   Musculoskeletal negative musculoskeletal ROS (+)   Abdominal   Peds negative pediatric ROS (+)  Hematology negative hematology ROS (+)   Anesthesia Other Findings   Reproductive/Obstetrics negative OB ROS                             Anesthesia Physical Anesthesia Plan  ASA: 2  Anesthesia Plan: MAC   Post-op Pain Management:    Induction: Intravenous  PONV Risk Score and Plan: Propofol infusion, Treatment may vary due to age or medical condition and TIVA  Airway Management Planned: Natural Airway and Nasal Cannula  Additional Equipment: None  Intra-op Plan:   Post-operative Plan: Extubation in OR  Informed Consent: I have reviewed the patients History and Physical, chart, labs and discussed the procedure including the risks, benefits and alternatives for the proposed anesthesia with the patient or authorized representative who has indicated his/her understanding and acceptance.     Dental advisory given  Plan Discussed with: CRNA and Anesthesiologist  Anesthesia Plan Comments:         Anesthesia Quick Evaluation

## 2020-08-02 NOTE — Procedures (Signed)
Procedure: Electrical Cardioversion Indications:  Atrial Fibrillation  Procedure Details:  Consent: Risks of procedure as well as the alternatives and risks of each were explained to the (patient/caregiver).  Consent for procedure obtained.  Time Out: Verified patient identification, verified procedure, site/side was marked, verified correct patient position, special equipment/implants available, medications/allergies/relevent history reviewed, required imaging and test results available. PERFORMED.  Patient placed on cardiac monitor, pulse oximetry, supplemental oxygen as necessary.  Sedation given:  Propofol 225mg ; lidocaine 30mg  administered by CV anesthesia  for both TEE and DCCV Pacer pads placed anterior and posterior chest.  Cardioverted 1 time(s).  Cardioversion with synchronized biphasic 200J shock.  Evaluation: Findings: Post procedure EKG shows: NSR Complications: None Patient did tolerate procedure well.  Time Spent Directly with the Patient:    08/02/2020, 3:52 PM

## 2020-08-02 NOTE — Progress Notes (Signed)
PT Cancellation Note  Patient Details Name: DONNELL WION MRN: [id number] DOB: 03-30-1950   Cancelled Treatment:    Reason Eval/Treat Not Completed: Patient at procedure or test/unavailable Will follow up as schedule allows.   Farley Ly, PT, DPT  Acute Rehabilitation Services  Pager: [phone] Office: [phone]    Lehman Prom 08/02/2020, 2:40 PM

## 2020-08-02 NOTE — Progress Notes (Signed)
ANTICOAGULATION CONSULT NOTE - Follow Up Consult  Pharmacy Consult for Heparin Indication: atrial fibrillation  Allergies  Allergen Reactions   Ciprofloxacin Shortness Of Breath and Other (See Comments)    Numbness in extremities    Gabapentin Other (See Comments)    Mental problems, depression, anger    Patient Measurements: Height: 5\' 8"  (172.7 cm) Weight: 97.6 kg (215 lb 2.7 oz) IBW/kg (Calculated) : 68.4 Heparin Dosing Weight: 91.3 kg  Vital Signs: Temp: 98.3 F (36.8 C) (07/27 0612) BP: 121/73 (07/27 0612) Pulse Rate: 67 (07/27 0612)  Labs: Recent Labs    07/31/20 0347 07/31/20 1037 07/31/20 1734 08/01/20 0042 08/02/20 0225  HGB 14.4  --   --  14.7 14.0  HCT 42.8  --   --  42.7 41.6  PLT 160  --   --  186 178  HEPARINUNFRC 0.21* 0.49  --  0.35 0.32  CREATININE 0.48*  --  0.60* 0.66 0.51*     Estimated Creatinine Clearance: 96 mL/min (A) (by C-G formula based on SCr of 0.51 mg/dL (L)).  Assessment: 65 YOM presented with fatigue, SOB, with new onset afib. He was not previously on anticoagulation. CBC stable within normal. No signs or symptoms of bleeding noted. Heparin level therapeutic at 0.32 today on heparin drip rate 1800 units/hr. Patient is NPO today.   Goal of Therapy:  Heparin level 0.3-0.7 units/ml Monitor platelets by anticoagulation protocol: Yes   Plan:  Continue heparin IV 1800 units/hr Monitor H/H, signs and symptoms of bleeding, and platelets Daily HL and CBC F/u oral AC plan   62, Pharm.D. PGY-1 Ambulatory Care Resident Phone:[phone] 08/02/2020 11:12 AM

## 2020-08-02 NOTE — Progress Notes (Signed)
Patient ID: Samuel Barnes, male   DOB: January 24, 1950, 70 y.o.   MRN: [id number]  PROGRESS NOTE    Samuel Barnes  FXT:[id number] DOB: December 26, 1950 DOA: 07/28/2020 PCP: Pcp, No   Brief Narrative:  70 y.o. male with a history of aortic atherosclerosis, BPH, obesity, coronary artery calcification, depression, hyperlipidemia, hypertension, alcohol and tobacco use presented with worsening shortness of breath.  He was found to have acute diastolic heart failure, paroxysmal new onset atrial fibrillation and pleural effusion requiring oxygen supplementation.  He was started on IV Lasix and heparin.  Cardiology was consulted.  He also underwent right-sided thoracentesis.  Assessment & Plan:   Acute hypoxic respiratory failure -Possibly from heart failure exacerbation and pleural effusion -Required up to 8 L oxygen during this hospitalization.  Respiratory status is improving and currently on 5 L oxygen.  Wean off as able.  Acute probable diastolic heart failure -Echo showed normal EF however LV diastolic dysfunction could not be evaluated -Cardiology following.  Continue strict input and output.  Daily weights.  Continue IV Lasix.  Fluid restriction.  Continue metoprolol and losartan  Paroxysmal A. fib, new onset -Currently rate controlled.  Currently on IV heparin.  Cardiology planning for TEE/DCCV today.  Continue metoprolol  Hyponatremia -Possibly from fluid overload.  Sodium 124 on admission.  Improving to 129 today.  Monitor  Hypertension -Blood pressure stable.  Continue metoprolol, Lasix and losartan.  Amlodipine on hold.  Coronary artery calcification Hyperlipidemia -Continue statin.  On lovastatin as an outpatient  BPH -Continue terazosin  Alcohol use -No signs of withdrawal.  Continue thiamine, multivitamin and folic acid  Tobacco use -Smoking cessation discussed by prior hospitalist.  Patient declined nicotine replacement therapy. -Outpatient follow-up.  Continue  DuoNeb as needed  Fatty liver -Seen on imaging.  Likely related to alcohol consumption.  Outpatient follow-up  Generalized deconditioning -PT eval  Depression -Continue Zoloft  Hyperglycemia/prediabetes -A1c 5.7.  Outpatient follow-up.  DVT prophylaxis: Heparin Code Status: Full Family Communication: None at bedside Disposition Plan: Status is: Inpatient  Remains inpatient appropriate because:Inpatient level of care appropriate due to severity of illness  Dispo: The patient is from: Home              Anticipated d/c is to: Home in 1 to 2 days once cleared by cardiology and patient improves medically              Patient currently is not medically stable to d/c.   Difficult to place patient No  Consultants: Cardiology  Procedures: Echo  Antimicrobials: None   Subjective: Patient seen and examined at bedside.  Feels slightly better.  Denies current chest pain or worsening shortness breath.  No overnight fever or vomiting reported.  Objective: Vitals:   08/01/20 2044 08/02/20 0500 08/02/20 0505 08/02/20 0612  BP: 132/81   121/73  Pulse: 66  71 67  Resp: 19  16 18   Temp: 98.2 F (36.8 C)   98.3 F (36.8 C)  TempSrc:      SpO2: 97%  91% 94%  Weight:  97.6 kg    Height:        Intake/Output Summary (Last 24 hours) at 08/02/2020 1121 Last data filed at 08/02/2020 0907 Gross per 24 hour  Intake 523.76 ml  Output 1400 ml  Net -876.24 ml   Filed Weights   07/31/20 0500 08/01/20 0508 08/02/20 0500  Weight: 97.7 kg 97.1 kg 97.6 kg    Examination:  General exam: Appears calm and comfortable.  Currently  on 5 L oxygen via nasal cannula. Respiratory system: Bilateral decreased breath sounds at bases with some scattered crackles Cardiovascular system: S1 & S2 heard, Rate controlled Gastrointestinal system: Abdomen is nondistended, soft and nontender. Normal bowel sounds heard. Extremities: No cyanosis, clubbing; trace lower extremity edema Central nervous system:  Alert and oriented. No focal neurological deficits. Moving extremities Skin: No rashes, lesions or ulcers Psychiatry: Judgement and insight appear normal. Mood & affect appropriate.     Data Reviewed: I have personally reviewed following labs and imaging studies  CBC: Recent Labs  Lab 07/28/20 1051 07/29/20 0453 07/30/20 0555 07/31/20 0347 08/01/20 0042 08/02/20 0225  WBC 8.2 7.6 7.2 7.8 9.2 8.0  NEUTROABS 6.7  --   --   --   --   --   HGB 15.8 14.9 14.8 14.4 14.7 14.0  HCT 44.4 42.5 42.2 42.8 42.7 41.6  MCV 93.1 94.2 93.8 95.1 95.5 96.7  PLT 209 205 176 160 186 178   Basic Metabolic Panel: Recent Labs  Lab 07/28/20 1051 07/29/20 0453 07/29/20 1137 07/30/20 0555 07/30/20 1418 07/31/20 0347 07/31/20 1734 08/01/20 0042 08/02/20 0225  NA 124* 124*   < > 126* 127* 125* 129* 129* 129*  K 4.3 4.9   < > 3.8 3.3* 3.9 3.9 3.7 3.7  CL 84* 87*   < > 87* 88* 88* 89* 89* 91*  CO2 30 27   < > 28 31 29 31 31 29   GLUCOSE 130* 112*   < > 102* 143* 109* 100* 103* 98  BUN 10 8   < > 9 9 10 11 15 11   CREATININE 0.50* 0.51*   < > 0.49* 0.59* 0.48* 0.60* 0.66 0.51*  CALCIUM 8.7* 8.7*   < > 8.8* 8.9 8.6* 9.2 8.8* 8.6*  MG 1.6* 1.9  --  1.7  --   --   --   --   --    < > = values in this interval not displayed.   GFR: Estimated Creatinine Clearance: 96 mL/min (A) (by C-G formula based on SCr of 0.51 mg/dL (L)). Liver Function Tests: Recent Labs  Lab 07/28/20 1051 07/31/20 1734  AST 19  --   ALT 14  --   ALKPHOS 102  --   BILITOT 1.0  --   PROT 7.2 6.4*  ALBUMIN 4.1  --    No results for input(s): LIPASE, AMYLASE in the last 168 hours. No results for input(s): AMMONIA in the last 168 hours. Coagulation Profile: No results for input(s): INR, PROTIME in the last 168 hours. Cardiac Enzymes: No results for input(s): CKTOTAL, CKMB, CKMBINDEX, TROPONINI in the last 168 hours. BNP (last 3 results) No results for input(s): PROBNP in the last 8760 hours. HbA1C: No results for  input(s): HGBA1C in the last 72 hours. CBG: No results for input(s): GLUCAP in the last 168 hours. Lipid Profile: No results for input(s): CHOL, HDL, LDLCALC, TRIG, CHOLHDL, LDLDIRECT in the last 72 hours. Thyroid Function Tests: No results for input(s): TSH, T4TOTAL, FREET4, T3FREE, THYROIDAB in the last 72 hours. Anemia Panel: No results for input(s): VITAMINB12, FOLATE, FERRITIN, TIBC, IRON, RETICCTPCT in the last 72 hours. Sepsis Labs: Recent Labs  Lab 07/28/20 1051 07/28/20 1249  LATICACIDVEN 2.1* 0.8    Recent Results (from the past 240 hour(s))  Resp Panel by RT-PCR (Flu A&B, Covid) Nasopharyngeal Swab     Status: None   Collection Time: 07/28/20 10:51 AM   Specimen: Nasopharyngeal Swab; Nasopharyngeal(NP) swabs in vial transport  medium  Result Value Ref Range Status   SARS Coronavirus 2 by RT PCR NEGATIVE NEGATIVE Final    Comment: (NOTE) SARS-CoV-2 target nucleic acids are NOT DETECTED.  The SARS-CoV-2 RNA is generally detectable in upper respiratory specimens during the acute phase of infection. The lowest concentration of SARS-CoV-2 viral copies this assay can detect is 138 copies/mL. A negative result does not preclude SARS-Cov-2 infection and should not be used as the sole basis for treatment or other patient management decisions. A negative result may occur with  improper specimen collection/handling, submission of specimen other than nasopharyngeal swab, presence of viral mutation(s) within the areas targeted by this assay, and inadequate number of viral copies(<138 copies/mL). A negative result must be combined with clinical observations, patient history, and epidemiological information. The expected result is Negative.  Fact Sheet for Patients:  BloggerCourse.com  Fact Sheet for Healthcare Providers:  SeriousBroker.it  This test is no t yet approved or cleared by the [place] FDA and  has been  authorized for detection and/or diagnosis of SARS-CoV-2 by FDA under an Emergency Use Authorization (EUA). This EUA will remain  in effect (meaning this test can be used) for the duration of the COVID-19 declaration under Section 564(b)(1) of the Act, 21 U.S.C.section 360bbb-3(b)(1), unless the authorization is terminated  or revoked sooner.       Influenza A by PCR NEGATIVE NEGATIVE Final   Influenza B by PCR NEGATIVE NEGATIVE Final    Comment: (NOTE) The Xpert Xpress SARS-CoV-2/FLU/RSV plus assay is intended as an aid in the diagnosis of influenza from Nasopharyngeal swab specimens and should not be used as a sole basis for treatment. Nasal washings and aspirates are unacceptable for Xpert Xpress SARS-CoV-2/FLU/RSV testing.  Fact Sheet for Patients: BloggerCourse.com  Fact Sheet for Healthcare Providers: SeriousBroker.it  This test is not yet approved or cleared by the [place] FDA and has been authorized for detection and/or diagnosis of SARS-CoV-2 by FDA under an Emergency Use Authorization (EUA). This EUA will remain in effect (meaning this test can be used) for the duration of the COVID-19 declaration under Section 564(b)(1) of the Act, 21 U.S.C. section 360bbb-3(b)(1), unless the authorization is terminated or revoked.  Performed at [facility], [address] [place], [place] [address]   Blood culture (routine x 2)     Status: None (Preliminary result)   Collection Time: 07/28/20  2:43 PM   Specimen: Left Antecubital; Blood  Result Value Ref Range Status   Specimen Description   Final    LEFT ANTECUBITAL BLOOD Performed at [facility], [address] [place], [place] [address]    Special Requests   Final    BOTTLES DRAWN AEROBIC AND ANAEROBIC Blood Culture adequate volume Performed at [facility], [address] [place], [place] [address]    Culture   Final    NO GROWTH  4 DAYS Performed at [facility], [address], [place], [place] [address]    Report Status PENDING  Incomplete  Blood culture (routine x 2)     Status: None (Preliminary result)   Collection Time: 07/28/20  2:53 PM   Specimen: BLOOD RIGHT HAND  Result Value Ref Range Status   Specimen Description   Final    BLOOD RIGHT HAND BLOOD Performed at [facility], [address] [place], [place] [address]    Special Requests   Final    BOTTLES DRAWN AEROBIC  AND ANAEROBIC Blood Culture adequate volume Performed at [facility], [address] [place], [place] [address]    Culture   Final    NO GROWTH 4 DAYS Performed at [facility], [address] [place], [place] [address]    Report Status PENDING  Incomplete  C Difficile Quick Screen w PCR reflex     Status: None   Collection Time: 07/28/20 11:55 PM   Specimen: STOOL  Result Value Ref Range Status   C Diff antigen NEGATIVE NEGATIVE Final   C Diff toxin NEGATIVE NEGATIVE Final   C Diff interpretation No C. difficile detected.  Final    Comment: Performed at [facility], [address], [place], [place] [address]  Body fluid culture w Gram Stain     Status: None (Preliminary result)   Collection Time: 07/31/20  4:16 PM   Specimen: Lung, Right; Pleural Fluid  Result Value Ref Range Status   Specimen Description PLEURAL FLUID  Final   Special Requests LUNG RIGHT  Final   Gram Stain   Final    FEW WBC PRESENT, PREDOMINANTLY MONONUCLEAR NO ORGANISMS SEEN    Culture   Final    NO GROWTH 2 DAYS Performed at [facility], [address] [place], [place] [address]    Report Status PENDING  Incomplete         Radiology Studies: DG Chest 1 View  Result Date: 07/31/2020 CLINICAL DATA:  70 year old male status post right-sided thoracentesis. EXAM: CHEST  1 VIEW COMPARISON:  Chest radiograph dated 07/31/2020. FINDINGS: Slight interval decrease in the size of the right pleural  effusion. Trace right pleural effusion and right lung base atelectasis/infiltrate remains. No pneumothorax. The left lung is clear. Stable cardiomegaly. Atherosclerotic calcification of the aortic arch. No acute osseous pathology. IMPRESSION: Slight interval decrease in the size of the right pleural effusion. No pneumothorax. Electronically Signed   By: Elgie Collard M.D.   On: 07/31/2020 16:37   DG CHEST PORT 1 VIEW  Result Date: 08/01/2020 CLINICAL DATA:  Shortness of breath.  Prior thoracentesis. EXAM: PORTABLE CHEST 1 VIEW COMPARISON:  07/31/2020. FINDINGS: Mediastinum hilar structures normal. Cardiomegaly with pulmonary venous congestion again noted. Low lung volumes with persistent bibasilar atelectasis. Small right pleural effusion, decreased in size after prior right thoracentesis. No pneumothorax. Old right rib fractures. IMPRESSION: 1. Cardiomegaly with pulmonary venous congestion again noted. Low lung volumes with bibasilar atelectasis again noted. 2. Small right pleural effusion, decreased in size after prior thoracentesis. No pneumothorax. Electronically Signed   By: Maisie Fus  Register   On: 08/01/2020 08:03   DG CHEST PORT 1 VIEW  Result Date: 07/31/2020 CLINICAL DATA:  Shortness of breath, right-sided chest pain, status post thoracentesis today EXAM: PORTABLE CHEST 1 VIEW COMPARISON:  07/31/2020 FINDINGS: Unchanged AP portable chest radiograph. Cardiomegaly. Layering right pleural effusion with associated atelectasis or consolidation. No pneumothorax. No new airspace opacity. Redemonstrated chronic fracture deformities of the right ribs. IMPRESSION: 1.  Unchanged AP portable chest radiograph. 2. Layering right pleural effusion with associated atelectasis or consolidation. 3.  No pneumothorax. No new airspace opacity. 4.  Cardiomegaly. Electronically Signed   By: Lauralyn Primes M.D.   On: 07/31/2020 20:35   DG CHEST PORT 1 VIEW  Result Date: 07/31/2020 CLINICAL DATA:  Hypoxia and shortness  breath EXAM: PORTABLE CHEST 1 VIEW COMPARISON:  CT chest 07/28/2020 FINDINGS: Hazy obscuration of the right lung base compatible with known right pleural effusion and right  lower lobe atelectasis, likely with a component of right middle lobe atelectasis as shown on CT from 07/28/2020. Moderate cardiomegaly is present. Atherosclerotic calcification of the aortic arch. Old healed right rib fractures. The left lung remains clear. IMPRESSION: 1. Similar appearance of right pleural effusion and opacification of the right lung base due to atelectasis of the right lower lobe and part of the right middle lobe. 2. Moderate cardiomegaly. 3.  Aortic Atherosclerosis (ICD10-I70.0). Electronically Signed   By: Gaylyn RongWalter  Liebkemann M.D.   On: 07/31/2020 13:28   IR THORACENTESIS ASP PLEURAL SPACE W/IMG GUIDE  Result Date: 07/31/2020 INDICATION: Patient presents today with shortness of breath and right pleural effusion. Interventional radiology asked to perform a diagnostic and therapeutic thoracentesis. EXAM: ULTRASOUND GUIDED THORACENTESIS MEDICATIONS: 1% lidocaine 10 mL COMPLICATIONS: None immediate. PROCEDURE: An ultrasound guided thoracentesis was thoroughly discussed with the patient and questions answered. The benefits, risks, alternatives and complications were also discussed. The patient understands and wishes to proceed with the procedure. Written consent was obtained. Ultrasound was performed to localize and mark an adequate pocket of fluid in the right chest. The area was then prepped and draped in the normal sterile fashion. 1% Lidocaine was used for local anesthesia. Under ultrasound guidance a 6 Fr Safe-T-Centesis catheter was introduced. Thoracentesis was performed. The catheter was removed and a dressing applied. FINDINGS: A total of approximately 1.5 L of amber-colored fluid was removed. Samples were sent to the laboratory as requested by the clinical team. IMPRESSION: Successful ultrasound guided right  thoracentesis yielding 1.5 L of pleural fluid. Read by: Alwyn RenJamie Covington, NP Electronically Signed   By: Marliss Cootsylan  Suttle MD   On: 07/31/2020 16:35        Scheduled Meds:  FLUoxetine  20 mg Oral QHS   folic acid  1 mg Oral Daily   furosemide  40 mg Intravenous BID   losartan  50 mg Oral Daily   metoprolol tartrate  50 mg Oral BID   montelukast  10 mg Oral Daily   multivitamin with minerals  1 tablet Oral Daily   potassium chloride  20 mEq Oral Daily   pravastatin  20 mg Oral q1800   terazosin  5 mg Oral Daily   thiamine  100 mg Oral Daily   Continuous Infusions:  heparin 1,800 Units/hr (08/02/20 0907)          Glade LloydKshitiz Kester Stimpson, MD [facility] 08/02/2020, 11:21 AM

## 2020-08-02 NOTE — Interval H&P Note (Signed)
History and Physical Interval Note:  08/02/2020 3:10 PM  Samuel Barnes  has presented today for surgery, with the diagnosis of afib.  The various methods of treatment have been discussed with the patient and family. After consideration of risks, benefits and other options for treatment, the patient has consented to  Procedure(s): TRANSESOPHAGEAL ECHOCARDIOGRAM (TEE) (N/A) CARDIOVERSION (N/A) as a surgical intervention.  The patient's history has been reviewed, patient examined, no change in status, stable for surgery.  I have reviewed the patient's chart and labs.  Questions were answered to the patient's satisfaction.     Meriam Sprague

## 2020-08-02 NOTE — Progress Notes (Signed)
Progress Note  Patient Name: Samuel Barnes Date of Encounter: 08/02/2020  [facility] Cardiologist: Armanda Magic, MD   Subjective  2D echo with normal LVF.  No PE on chest CTA.  No ischemia on nuclear stress test  No chest pain or SOB.  Plan for TEE/DCCV today Inpatient Medications    Scheduled Meds:  FLUoxetine  20 mg Oral QHS   folic acid  1 mg Oral Daily   furosemide  40 mg Intravenous BID   losartan  50 mg Oral Daily   metoprolol tartrate  50 mg Oral BID   montelukast  10 mg Oral Daily   multivitamin with minerals  1 tablet Oral Daily   potassium chloride  20 mEq Oral Daily   pravastatin  20 mg Oral q1800   terazosin  5 mg Oral Daily   thiamine  100 mg Oral Daily   Continuous Infusions:  heparin 1,800 Units/hr (08/02/20 0907)   PRN Meds: acetaminophen **OR** acetaminophen, ipratropium-albuterol, lidocaine, prochlorperazine   Vital Signs    Vitals:   08/01/20 2044 08/02/20 0500 08/02/20 0505 08/02/20 0612  BP: 132/81   121/73  Pulse: 66  71 67  Resp: 19  16 18   Temp: 98.2 F (36.8 C)   98.3 F (36.8 C)  TempSrc:      SpO2: 97%  91% 94%  Weight:  97.6 kg    Height:        Intake/Output Summary (Last 24 hours) at 08/02/2020 08/04/2020 Last data filed at 08/02/2020 08/04/2020 Gross per 24 hour  Intake 523.76 ml  Output 1400 ml  Net -876.24 ml    Last 3 Weights 08/02/2020 08/01/2020 07/31/2020  Weight (lbs) 215 lb 2.7 oz 214 lb 1.1 oz 215 lb 6.4 oz  Weight (kg) 97.6 kg 97.1 kg 97.705 kg      Telemetry    Atrial fibrillation  - Personally Reviewed  ECG    No new EKG to review - Personally Reviewed  Physical Exam   GEN: Well nourished, well developed in no acute distress HEENT: Normal NECK: No JVD; No carotid bruits LYMPHATICS: No lymphadenopathy CARDIAC:irregularly irregular, no murmurs, rubs, gallops RESPIRATORY:  Clear to auscultation without rales, wheezing or rhonchi  ABDOMEN: Soft, non-tender, non-distended MUSCULOSKELETAL:  No edema; No  deformity  SKIN: Warm and dry NEUROLOGIC:  Alert and oriented x 3 PSYCHIATRIC:  Normal affect   Labs    High Sensitivity Troponin:   Recent Labs  Lab 07/28/20 1051 07/28/20 1249  TROPONINIHS 2 2       Chemistry Recent Labs  Lab 07/28/20 1051 07/29/20 0453 07/31/20 1734 08/01/20 0042 08/02/20 0225  NA 124*   < > 129* 129* 129*  K 4.3   < > 3.9 3.7 3.7  CL 84*   < > 89* 89* 91*  CO2 30   < > 31 31 29   GLUCOSE 130*   < > 100* 103* 98  BUN 10   < > 11 15 11   CREATININE 0.50*   < > 0.60* 0.66 0.51*  CALCIUM 8.7*   < > 9.2 8.8* 8.6*  PROT 7.2  --  6.4*  --   --   ALBUMIN 4.1  --   --   --   --   AST 19  --   --   --   --   ALT 14  --   --   --   --   ALKPHOS 102  --   --   --   --  BILITOT 1.0  --   --   --   --   GFRNONAA >60   < > >60 >60 >60  ANIONGAP 10   < > 9 9 9    < > = values in this interval not displayed.      Hematology Recent Labs  Lab 07/31/20 0347 08/01/20 0042 08/02/20 0225  WBC 7.8 9.2 8.0  RBC 4.50 4.47 4.30  HGB 14.4 14.7 14.0  HCT 42.8 42.7 41.6  MCV 95.1 95.5 96.7  MCH 32.0 32.9 32.6  MCHC 33.6 34.4 33.7  RDW 13.1 13.0 13.1  PLT 160 186 178     BNP Recent Labs  Lab 07/28/20 1051 07/31/20 1037  BNP 302.6* 245.9*      DDimer  Recent Labs  Lab 07/28/20 1051  DDIMER 1.21*     CHA2DS2-VASc Score = 4  This indicates a 4.8% annual risk of stroke. The patient's score is based upon: CHF History: Yes HTN History: Yes Diabetes History: No Stroke History: No Vascular Disease History: Yes Age Score: 1 Gender Score: 0   Radiology    DG Chest 1 View  Result Date: 07/31/2020 CLINICAL DATA:  70 year old male status post right-sided thoracentesis. EXAM: CHEST  1 VIEW COMPARISON:  Chest radiograph dated 07/31/2020. FINDINGS: Slight interval decrease in the size of the right pleural effusion. Trace right pleural effusion and right lung base atelectasis/infiltrate remains. No pneumothorax. The left lung is clear. Stable  cardiomegaly. Atherosclerotic calcification of the aortic arch. No acute osseous pathology. IMPRESSION: Slight interval decrease in the size of the right pleural effusion. No pneumothorax. Electronically Signed   By: 08/02/2020 M.D.   On: 07/31/2020 16:37   DG CHEST PORT 1 VIEW  Result Date: 08/01/2020 CLINICAL DATA:  Shortness of breath.  Prior thoracentesis. EXAM: PORTABLE CHEST 1 VIEW COMPARISON:  07/31/2020. FINDINGS: Mediastinum hilar structures normal. Cardiomegaly with pulmonary venous congestion again noted. Low lung volumes with persistent bibasilar atelectasis. Small right pleural effusion, decreased in size after prior right thoracentesis. No pneumothorax. Old right rib fractures. IMPRESSION: 1. Cardiomegaly with pulmonary venous congestion again noted. Low lung volumes with bibasilar atelectasis again noted. 2. Small right pleural effusion, decreased in size after prior thoracentesis. No pneumothorax. Electronically Signed   By: 08/02/2020  Register   On: 08/01/2020 08:03   DG CHEST PORT 1 VIEW  Result Date: 07/31/2020 CLINICAL DATA:  Shortness of breath, right-sided chest pain, status post thoracentesis today EXAM: PORTABLE CHEST 1 VIEW COMPARISON:  07/31/2020 FINDINGS: Unchanged AP portable chest radiograph. Cardiomegaly. Layering right pleural effusion with associated atelectasis or consolidation. No pneumothorax. No new airspace opacity. Redemonstrated chronic fracture deformities of the right ribs. IMPRESSION: 1.  Unchanged AP portable chest radiograph. 2. Layering right pleural effusion with associated atelectasis or consolidation. 3.  No pneumothorax. No new airspace opacity. 4.  Cardiomegaly. Electronically Signed   By: 08/02/2020 M.D.   On: 07/31/2020 20:35   DG CHEST PORT 1 VIEW  Result Date: 07/31/2020 CLINICAL DATA:  Hypoxia and shortness breath EXAM: PORTABLE CHEST 1 VIEW COMPARISON:  CT chest 07/28/2020 FINDINGS: Hazy obscuration of the right lung base compatible with known  right pleural effusion and right lower lobe atelectasis, likely with a component of right middle lobe atelectasis as shown on CT from 07/28/2020. Moderate cardiomegaly is present. Atherosclerotic calcification of the aortic arch. Old healed right rib fractures. The left lung remains clear. IMPRESSION: 1. Similar appearance of right pleural effusion and opacification of the right lung base due to  atelectasis of the right lower lobe and part of the right middle lobe. 2. Moderate cardiomegaly. 3.  Aortic Atherosclerosis (ICD10-I70.0). Electronically Signed   By: Walter  Liebkemann M.D.   On: 07/31/2020 13:28   IR THORACENTESIS ASP PLEURAL SPACE W/IMG GUIDE  Result Date: 07/31/2020 INDICATION: Patient presents today with shortness of breath and right pleural effusion. Interventional radiology asked to perform a diagnostic and therapeutic thoracentesis. EXAM: ULTRASOUND GUIDED THORACENTESIS MEDICATIONS: 1% lidocaine 10 mL COMPLICATIONS: None immediate. PROCEDURE: An ultrasound guided thoracentesis was thoroughly discussed with the patient and questions answered. The benefits, risks, alternatives and complications were also discussed. The patient understands and wishes to proceed with the procedure. Written consent was obtained. Ultrasound was performed to localize and mark an adequate pocket of fluid in the right chest. The area was then prepped and draped in the normal sterile fashion. 1% Lidocaine was used for local anesthesia. Under ultrasound guidance a 6 Fr Safe-T-Centesis catheter was introduced. Thoracentesis was performed. The catheter was removed and a dressing applied. FINDINGS: A total of approximately 1.5 L of amber-colored fluid was removed. Samples were sent to the laboratory as requested by the clinical team. IMPRESSION: Successful ultrasound guided right thoracentesis yielding 1.5 L of pleural fluid. Read by: Jamie Covington, NP Electronically Signed   By: Dylan  Suttle MD   On: 07/31/2020 16:35     Cardiac Studies   2D echo 07/2020 IMPRESSIONS    1. Left ventricular ejection fraction, by estimation, is 55 to 60%. The  left ventricle has normal function. The left ventricle has no regional  wall motion abnormalities. Left ventricular diastolic function could not  be evaluated.   2. Prominent moderator band of no clinical significance. Right  ventricular systolic function is mildly reduced. The right ventricular  size is normal. There is mildly elevated pulmonary artery systolic  pressure. The estimated right ventricular systolic  pressure is 36.5 mmHg.   3. The mitral valve is normal in structure. No evidence of mitral valve  regurgitation. No evidence of mitral stenosis.   4. The aortic valve is normal in structure. Aortic valve regurgitation is  not visualized. No aortic stenosis is present.   5. The inferior vena cava is dilated in size with <50% respiratory  variability, suggesting right atrial pressure of 15 mmHg.   Patient Profile     70 y.o. male  with a PMH of coronary artery calcifications, HTN, HLD, BPH s/p TURP, depression, and tobacco abuse,  who is being seen 07/29/2020 for the evaluation of new onset atrial fibrillation at the request of Dr. Nettey.  Assessment & Plan    1. Acute hypoxic respiratory failure likely 2/2 acute CHF: patient presented with several days of worsening SOB, cough, fatigue. He was hypoxic to 87% on arrival which improved with O2 via [place]. EKG with non-specific ST-T wave abnormalities. BNP 302. HsTrop negative x2. Found to have mild-moderate pleural effusions and mild pericardial effusion on CT but negative for PE. He has wheezing and poor air movement on exam.  - High suspicion for underlying COPD +/- OSA which may be contributing to his presentation. - his wheezing has resolved and BS are better with nebulizers - he has no edema on exam but still SOB some - Currently on Lasix 40mg IV BID - he put out 1.4L yesterday and is net neg 5L - SCr  remains stable at 0.51 and K+ 3.7 - wt up 1lb today and 16lbs overall since admit - Now on Duonebs for wheezing - 2D   echo  with normal LV function  - Lexiscan myoview with no ischemia.   - Underwent thoracentesis this admit of 1.5L amber colored fluid and breathing improved - BNP slightly improved from 302>245 - Na remains 129 - continue Lopressor 50mg  BID>>plan to transition to Toprol prior to discharge due to likely underlying COPD - continue IV lasix for now - BP improved on high dose of Losartan - restoring NSR with TEE/DCCV today should help with CHF  2. New onset atrial fibrillation: no prior history. EKG with rate controlled atrial fibrillation. TSH is wnl. He was started on metoprolol tartrate 50mg  BID (on succinate 50mg  daily at home) for rate control and a heparin gtt for stroke ppx. - CHA2DS2-VASc Score = 4 [CHF History: Yes, HTN History: Yes, Diabetes History: No, Stroke History: No, Vascular Disease History: Yes, Age Score: 1, Gender Score: 0].  Therefore, the patient's annual risk of stroke is 4.8 %.    - 2D echo with normal LVF - Consider outpatient sleep study to r/o OSA - plan for TEE/DCCV today as I think this will help with his CHF by restoring sinus   3. Coronary artery calcifications: noted on prior CT scans. Has not had any ischemic testing. Risk factors for obstructive CAD include HTN, HLD, and tobacco abuse.  - echo with normal LVF - Lexiscan myoview with no ischemia - Continue statin and BB - Continue aggressive risk factor modifications - no ASA due to need for DOAC   4. HTN: BP generally elevated. Home amlodipine stopped and he was started on metoprolol tartrate 50mg  BID (succinate 50mg  daily at home) and Lisinopril which was changed to Losartan - BP controlled on exam - Continue Lopressor 50mg  BID and transition to Toprol at discharge - Continue Losartan 50mg  daily   5. HLD: LDL 46 09/2019 on lovastatin at home and pravastatin this admission (hospital  formulary) - LDL 33 and HDL 34 this admit - Continue lovastatin    6. Hyponatremia: Na 124 on admission. Likely hypervolemic hyponatremia. - Na maintaining around 129 - Continue to monitor closely with diuresis as above    7. Tobacco abuse: smokes 1ppd which he has for the past 50 years. Cessation advised. Suspicions high for underlying COPD - Continue to encourage cessation   8. ETOH abuse: drinks 3-4 cocktails 3-4 days per week. Discussed relationship between ETOH abuse and Afib. - Continue to encourage cessation       I have spent a total of 35 minutes with patient reviewing 2D echo , telemetry, EKGs, labs and examining patient as well as establishing an assessment and plan that was discussed with the patient.  > 50% of time was spent in direct patient care.     For questions or updates, please contact [facility] Please consult [email or web address] for contact info under        Signed, , MD  08/02/2020, 9:21 AM

## 2020-08-02 NOTE — Anesthesia Postprocedure Evaluation (Signed)
Anesthesia Post Note  Patient: Samuel Barnes  Procedure(s) Performed: TRANSESOPHAGEAL ECHOCARDIOGRAM (TEE) CARDIOVERSION     Patient location during evaluation: Endoscopy Anesthesia Type: MAC Level of consciousness: awake and alert Pain management: pain level controlled Vital Signs Assessment: post-procedure vital signs reviewed and stable Respiratory status: spontaneous breathing, nonlabored ventilation and respiratory function stable Cardiovascular status: blood pressure returned to baseline and stable Postop Assessment: no apparent nausea or vomiting Anesthetic complications: no   No notable events documented.  Last Vitals:  Vitals:   08/02/20 1749 08/02/20 2035  BP:  125/72  Pulse: 62 76  Resp: 16 15  Temp:  36.5 C  SpO2: 93% 96%    Last Pain:  Vitals:   08/02/20 2035  TempSrc: Oral  PainSc:                  Merlinda Frederick

## 2020-08-02 NOTE — H&P (View-Only) (Signed)
Progress Note  Patient Name: Samuel Barnes Date of Encounter: 08/02/2020  [facility] Cardiologist: Armanda Magic, MD   Subjective  2D echo with normal LVF.  No PE on chest CTA.  No ischemia on nuclear stress test  No chest pain or SOB.  Plan for TEE/DCCV today Inpatient Medications    Scheduled Meds:  FLUoxetine  20 mg Oral QHS   folic acid  1 mg Oral Daily   furosemide  40 mg Intravenous BID   losartan  50 mg Oral Daily   metoprolol tartrate  50 mg Oral BID   montelukast  10 mg Oral Daily   multivitamin with minerals  1 tablet Oral Daily   potassium chloride  20 mEq Oral Daily   pravastatin  20 mg Oral q1800   terazosin  5 mg Oral Daily   thiamine  100 mg Oral Daily   Continuous Infusions:  heparin 1,800 Units/hr (08/02/20 0907)   PRN Meds: acetaminophen **OR** acetaminophen, ipratropium-albuterol, lidocaine, prochlorperazine   Vital Signs    Vitals:   08/01/20 2044 08/02/20 0500 08/02/20 0505 08/02/20 0612  BP: 132/81   121/73  Pulse: 66  71 67  Resp: 19  16 18   Temp: 98.2 F (36.8 C)   98.3 F (36.8 C)  TempSrc:      SpO2: 97%  91% 94%  Weight:  97.6 kg    Height:        Intake/Output Summary (Last 24 hours) at 08/02/2020 08/04/2020 Last data filed at 08/02/2020 08/04/2020 Gross per 24 hour  Intake 523.76 ml  Output 1400 ml  Net -876.24 ml    Last 3 Weights 08/02/2020 08/01/2020 07/31/2020  Weight (lbs) 215 lb 2.7 oz 214 lb 1.1 oz 215 lb 6.4 oz  Weight (kg) 97.6 kg 97.1 kg 97.705 kg      Telemetry    Atrial fibrillation  - Personally Reviewed  ECG    No new EKG to review - Personally Reviewed  Physical Exam   GEN: Well nourished, well developed in no acute distress HEENT: Normal NECK: No JVD; No carotid bruits LYMPHATICS: No lymphadenopathy CARDIAC:irregularly irregular, no murmurs, rubs, gallops RESPIRATORY:  Clear to auscultation without rales, wheezing or rhonchi  ABDOMEN: Soft, non-tender, non-distended MUSCULOSKELETAL:  No edema; No  deformity  SKIN: Warm and dry NEUROLOGIC:  Alert and oriented x 3 PSYCHIATRIC:  Normal affect   Labs    High Sensitivity Troponin:   Recent Labs  Lab 07/28/20 1051 07/28/20 1249  TROPONINIHS 2 2       Chemistry Recent Labs  Lab 07/28/20 1051 07/29/20 0453 07/31/20 1734 08/01/20 0042 08/02/20 0225  NA 124*   < > 129* 129* 129*  K 4.3   < > 3.9 3.7 3.7  CL 84*   < > 89* 89* 91*  CO2 30   < > 31 31 29   GLUCOSE 130*   < > 100* 103* 98  BUN 10   < > 11 15 11   CREATININE 0.50*   < > 0.60* 0.66 0.51*  CALCIUM 8.7*   < > 9.2 8.8* 8.6*  PROT 7.2  --  6.4*  --   --   ALBUMIN 4.1  --   --   --   --   AST 19  --   --   --   --   ALT 14  --   --   --   --   ALKPHOS 102  --   --   --   --  BILITOT 1.0  --   --   --   --   GFRNONAA >60   < > >60 >60 >60  ANIONGAP 10   < > 9 9 9    < > = values in this interval not displayed.      Hematology Recent Labs  Lab 07/31/20 0347 08/01/20 0042 08/02/20 0225  WBC 7.8 9.2 8.0  RBC 4.50 4.47 4.30  HGB 14.4 14.7 14.0  HCT 42.8 42.7 41.6  MCV 95.1 95.5 96.7  MCH 32.0 32.9 32.6  MCHC 33.6 34.4 33.7  RDW 13.1 13.0 13.1  PLT 160 186 178     BNP Recent Labs  Lab 07/28/20 1051 07/31/20 1037  BNP 302.6* 245.9*      DDimer  Recent Labs  Lab 07/28/20 1051  DDIMER 1.21*     CHA2DS2-VASc Score = 4  This indicates a 4.8% annual risk of stroke. The patient's score is based upon: CHF History: Yes HTN History: Yes Diabetes History: No Stroke History: No Vascular Disease History: Yes Age Score: 1 Gender Score: 0   Radiology    DG Chest 1 View  Result Date: 07/31/2020 CLINICAL DATA:  70 year old male status post right-sided thoracentesis. EXAM: CHEST  1 VIEW COMPARISON:  Chest radiograph dated 07/31/2020. FINDINGS: Slight interval decrease in the size of the right pleural effusion. Trace right pleural effusion and right lung base atelectasis/infiltrate remains. No pneumothorax. The left lung is clear. Stable  cardiomegaly. Atherosclerotic calcification of the aortic arch. No acute osseous pathology. IMPRESSION: Slight interval decrease in the size of the right pleural effusion. No pneumothorax. Electronically Signed   By: 08/02/2020 M.D.   On: 07/31/2020 16:37   DG CHEST PORT 1 VIEW  Result Date: 08/01/2020 CLINICAL DATA:  Shortness of breath.  Prior thoracentesis. EXAM: PORTABLE CHEST 1 VIEW COMPARISON:  07/31/2020. FINDINGS: Mediastinum hilar structures normal. Cardiomegaly with pulmonary venous congestion again noted. Low lung volumes with persistent bibasilar atelectasis. Small right pleural effusion, decreased in size after prior right thoracentesis. No pneumothorax. Old right rib fractures. IMPRESSION: 1. Cardiomegaly with pulmonary venous congestion again noted. Low lung volumes with bibasilar atelectasis again noted. 2. Small right pleural effusion, decreased in size after prior thoracentesis. No pneumothorax. Electronically Signed   By: 08/02/2020  Register   On: 08/01/2020 08:03   DG CHEST PORT 1 VIEW  Result Date: 07/31/2020 CLINICAL DATA:  Shortness of breath, right-sided chest pain, status post thoracentesis today EXAM: PORTABLE CHEST 1 VIEW COMPARISON:  07/31/2020 FINDINGS: Unchanged AP portable chest radiograph. Cardiomegaly. Layering right pleural effusion with associated atelectasis or consolidation. No pneumothorax. No new airspace opacity. Redemonstrated chronic fracture deformities of the right ribs. IMPRESSION: 1.  Unchanged AP portable chest radiograph. 2. Layering right pleural effusion with associated atelectasis or consolidation. 3.  No pneumothorax. No new airspace opacity. 4.  Cardiomegaly. Electronically Signed   By: 08/02/2020 M.D.   On: 07/31/2020 20:35   DG CHEST PORT 1 VIEW  Result Date: 07/31/2020 CLINICAL DATA:  Hypoxia and shortness breath EXAM: PORTABLE CHEST 1 VIEW COMPARISON:  CT chest 07/28/2020 FINDINGS: Hazy obscuration of the right lung base compatible with known  right pleural effusion and right lower lobe atelectasis, likely with a component of right middle lobe atelectasis as shown on CT from 07/28/2020. Moderate cardiomegaly is present. Atherosclerotic calcification of the aortic arch. Old healed right rib fractures. The left lung remains clear. IMPRESSION: 1. Similar appearance of right pleural effusion and opacification of the right lung base due to  atelectasis of the right lower lobe and part of the right middle lobe. 2. Moderate cardiomegaly. 3.  Aortic Atherosclerosis (ICD10-I70.0). Electronically Signed   By: Gaylyn RongWalter  Liebkemann M.D.   On: 07/31/2020 13:28   IR THORACENTESIS ASP PLEURAL SPACE W/IMG GUIDE  Result Date: 07/31/2020 INDICATION: Patient presents today with shortness of breath and right pleural effusion. Interventional radiology asked to perform a diagnostic and therapeutic thoracentesis. EXAM: ULTRASOUND GUIDED THORACENTESIS MEDICATIONS: 1% lidocaine 10 mL COMPLICATIONS: None immediate. PROCEDURE: An ultrasound guided thoracentesis was thoroughly discussed with the patient and questions answered. The benefits, risks, alternatives and complications were also discussed. The patient understands and wishes to proceed with the procedure. Written consent was obtained. Ultrasound was performed to localize and mark an adequate pocket of fluid in the right chest. The area was then prepped and draped in the normal sterile fashion. 1% Lidocaine was used for local anesthesia. Under ultrasound guidance a 6 Fr Safe-T-Centesis catheter was introduced. Thoracentesis was performed. The catheter was removed and a dressing applied. FINDINGS: A total of approximately 1.5 L of amber-colored fluid was removed. Samples were sent to the laboratory as requested by the clinical team. IMPRESSION: Successful ultrasound guided right thoracentesis yielding 1.5 L of pleural fluid. Read by: Alwyn RenJamie Covington, NP Electronically Signed   By: Marliss Cootsylan  Suttle MD   On: 07/31/2020 16:35     Cardiac Studies   2D echo 07/2020 IMPRESSIONS    1. Left ventricular ejection fraction, by estimation, is 55 to 60%. The  left ventricle has normal function. The left ventricle has no regional  wall motion abnormalities. Left ventricular diastolic function could not  be evaluated.   2. Prominent moderator band of no clinical significance. Right  ventricular systolic function is mildly reduced. The right ventricular  size is normal. There is mildly elevated pulmonary artery systolic  pressure. The estimated right ventricular systolic  pressure is 36.5 mmHg.   3. The mitral valve is normal in structure. No evidence of mitral valve  regurgitation. No evidence of mitral stenosis.   4. The aortic valve is normal in structure. Aortic valve regurgitation is  not visualized. No aortic stenosis is present.   5. The inferior vena cava is dilated in size with <50% respiratory  variability, suggesting right atrial pressure of 15 mmHg.   Patient Profile     70 y.o. male  with a PMH of coronary artery calcifications, HTN, HLD, BPH s/p TURP, depression, and tobacco abuse,  who is being seen 07/29/2020 for the evaluation of new onset atrial fibrillation at the request of Dr. Caleb PoppNettey.  Assessment & Plan    1. Acute hypoxic respiratory failure likely 2/2 acute CHF: patient presented with several days of worsening SOB, cough, fatigue. He was hypoxic to 87% on arrival which improved with O2 via [place]. EKG with non-specific ST-T wave abnormalities. BNP 302. HsTrop negative x2. Found to have mild-moderate pleural effusions and mild pericardial effusion on CT but negative for PE. He has wheezing and poor air movement on exam.  - High suspicion for underlying COPD +/- OSA which may be contributing to his presentation. - his wheezing has resolved and BS are better with nebulizers - he has no edema on exam but still SOB some - Currently on Lasix 40mg  IV BID - he put out 1.4L yesterday and is net neg 5L - SCr  remains stable at 0.51 and K+ 3.7 - wt up 1lb today and 16lbs overall since admit - Now on Duonebs for wheezing - 2D  echo  with normal LV function  - Lexiscan myoview with no ischemia.   - Underwent thoracentesis this admit of 1.5L amber colored fluid and breathing improved - BNP slightly improved from 302>245 - Na remains 129 - continue Lopressor 50mg  BID>>plan to transition to Toprol prior to discharge due to likely underlying COPD - continue IV lasix for now - BP improved on high dose of Losartan - restoring NSR with TEE/DCCV today should help with CHF  2. New onset atrial fibrillation: no prior history. EKG with rate controlled atrial fibrillation. TSH is wnl. He was started on metoprolol tartrate 50mg  BID (on succinate 50mg  daily at home) for rate control and a heparin gtt for stroke ppx. - CHA2DS2-VASc Score = 4 [CHF History: Yes, HTN History: Yes, Diabetes History: No, Stroke History: No, Vascular Disease History: Yes, Age Score: 1, Gender Score: 0].  Therefore, the patient's annual risk of stroke is 4.8 %.    - 2D echo with normal LVF - Consider outpatient sleep study to r/o OSA - plan for TEE/DCCV today as I think this will help with his CHF by restoring sinus   3. Coronary artery calcifications: noted on prior CT scans. Has not had any ischemic testing. Risk factors for obstructive CAD include HTN, HLD, and tobacco abuse.  - echo with normal LVF - Lexiscan myoview with no ischemia - Continue statin and BB - Continue aggressive risk factor modifications - no ASA due to need for DOAC   4. HTN: BP generally elevated. Home amlodipine stopped and he was started on metoprolol tartrate 50mg  BID (succinate 50mg  daily at home) and Lisinopril which was changed to Losartan - BP controlled on exam - Continue Lopressor 50mg  BID and transition to Toprol at discharge - Continue Losartan 50mg  daily   5. HLD: LDL 46 09/2019 on lovastatin at home and pravastatin this admission (hospital  formulary) - LDL 33 and HDL 34 this admit - Continue lovastatin    6. Hyponatremia: Na 124 on admission. Likely hypervolemic hyponatremia. - Na maintaining around 129 - Continue to monitor closely with diuresis as above    7. Tobacco abuse: smokes 1ppd which he has for the past 50 years. Cessation advised. Suspicions high for underlying COPD - Continue to encourage cessation   8. ETOH abuse: drinks 3-4 cocktails 3-4 days per week. Discussed relationship between ETOH abuse and Afib. - Continue to encourage cessation       I have spent a total of 35 minutes with patient reviewing 2D echo , telemetry, EKGs, labs and examining patient as well as establishing an assessment and plan that was discussed with the patient.  > 50% of time was spent in direct patient care.     For questions or updates, please contact [facility] Please consult [email or web address] for contact info under        Signed, , MD  08/02/2020, 9:21 AM

## 2020-08-02 NOTE — Procedures (Signed)
     Transesophageal Echocardiogram Note  Samuel Barnes [id number] July 22, 1950  Procedure: Transesophageal Echocardiogram Indications: Afib  Procedure Details Consent: Obtained Time Out: Verified patient identification, verified procedure, site/side was marked, verified correct patient position, special equipment/implants available, Radiology Safety Procedures followed,  medications/allergies/relevent history reviewed, required imaging and test results available.  Performed  Medications: Propofol: 225mg  Lidocaine: 30mg   Left Ventrical:  LVEF 55-60%  Mitral Valve: Normal structure, mild MR  Aortic Valve: Trileaflet, no AI  Tricuspid Valve: Normal structure, mild TR  Pulmonic Valve: Normal structure, trivial PR  Left Atrium/ Left atrial appendage: No evidence of LAA thrombus  Atrial septum: No PFO by color doppler  Aorta: Grade III plaque  Successful DCCV performed right after TEE. Please see separate note.    Complications: No apparent complications Patient did tolerate procedure well.  , MD 08/02/2020, 3:49 PM

## 2020-08-03 ENCOUNTER — Inpatient Hospital Stay (HOSPITAL_COMMUNITY): Payer: Medicare Other

## 2020-08-03 ENCOUNTER — Encounter (HOSPITAL_COMMUNITY): Payer: Self-pay | Admitting: Cardiology

## 2020-08-03 DIAGNOSIS — J9601 Acute respiratory failure with hypoxia: Secondary | ICD-10-CM | POA: Diagnosis not present

## 2020-08-03 DIAGNOSIS — I4891 Unspecified atrial fibrillation: Secondary | ICD-10-CM | POA: Diagnosis not present

## 2020-08-03 DIAGNOSIS — I5031 Acute diastolic (congestive) heart failure: Secondary | ICD-10-CM | POA: Diagnosis not present

## 2020-08-03 LAB — BASIC METABOLIC PANEL
Anion gap: 6 (ref 5–15)
BUN: 11 mg/dL (ref 8–23)
CO2: 33 mmol/L — ABNORMAL HIGH (ref 22–32)
Calcium: 8.9 mg/dL (ref 8.9–10.3)
Chloride: 90 mmol/L — ABNORMAL LOW (ref 98–111)
Creatinine, Ser: 0.54 mg/dL — ABNORMAL LOW (ref 0.61–1.24)
GFR, Estimated: >60 mL/min (ref >60)
Glucose, Bld: 118 mg/dL — ABNORMAL HIGH (ref 70–99)
Potassium: 4.3 mmol/L (ref 3.5–5.1)
Sodium: 129 mmol/L — ABNORMAL LOW (ref 135–145)

## 2020-08-03 LAB — CBC
HCT: 42.8 % (ref 39.0–52.0)
Hemoglobin: 14.4 g/dL (ref 13.0–17.0)
MCH: 32.4 pg (ref 26.0–34.0)
MCHC: 33.6 g/dL (ref 30.0–36.0)
MCV: 96.4 fL (ref 80.0–100.0)
Platelets: 213 10*3/uL (ref 150–400)
RBC: 4.44 MIL/uL (ref 4.22–5.81)
RDW: 13.1 % (ref 11.5–15.5)
WBC: 9.3 10*3/uL (ref 4.0–10.5)
nRBC: 0 % (ref 0.0–0.2)

## 2020-08-03 LAB — HEPARIN LEVEL (UNFRACTIONATED): Heparin Unfractionated: 0.23 IU/mL — ABNORMAL LOW (ref 0.30–0.70)

## 2020-08-03 LAB — BODY FLUID CULTURE W GRAM STAIN: Culture: NO GROWTH

## 2020-08-03 MED ORDER — POLYETHYLENE GLYCOL 3350 17 G PO PACK
17.0000 g | PACK | Freq: Every day | ORAL | Status: DC | PRN
  Filled 2020-08-03: qty 1

## 2020-08-03 MED ORDER — METOPROLOL SUCCINATE ER 100 MG PO TB24
100.0000 mg | ORAL_TABLET | Freq: Every day | ORAL | Status: DC
  Administered 2020-08-03 – 2020-08-09 (×7): 100 mg via ORAL
  Filled 2020-08-03 (×7): qty 1

## 2020-08-03 MED ORDER — FUROSEMIDE 10 MG/ML IJ SOLN
80.0000 mg | Freq: Three times a day (TID) | INTRAMUSCULAR | Status: DC
Start: 2020-08-03 — End: 2020-08-07
  Administered 2020-08-03 – 2020-08-07 (×12): 80 mg via INTRAVENOUS
  Filled 2020-08-03 (×13): qty 8

## 2020-08-03 MED ORDER — BISACODYL 10 MG RE SUPP: 10.0000 mg | Freq: Every day | RECTAL | Status: DC | PRN

## 2020-08-03 MED ORDER — POTASSIUM CHLORIDE CRYS ER 20 MEQ PO TBCR
20.0000 meq | EXTENDED_RELEASE_TABLET | Freq: Two times a day (BID) | ORAL | Status: DC
  Administered 2020-08-03 – 2020-08-09 (×12): 20 meq via ORAL
  Filled 2020-08-03 (×12): qty 1

## 2020-08-03 MED ORDER — PRAVASTATIN SODIUM 10 MG PO TABS
20.0000 mg | ORAL_TABLET | Freq: Every day | ORAL | Status: DC
  Administered 2020-08-03 – 2020-08-07 (×5): 20 mg via ORAL
  Filled 2020-08-03 (×5): qty 2

## 2020-08-03 MED ORDER — APIXABAN 5 MG PO TABS
5.0000 mg | ORAL_TABLET | Freq: Two times a day (BID) | ORAL | Status: DC
  Administered 2020-08-03 – 2020-08-09 (×13): 5 mg via ORAL
  Filled 2020-08-03 (×13): qty 1

## 2020-08-03 MED ORDER — SENNOSIDES-DOCUSATE SODIUM 8.6-50 MG PO TABS
1.0000 | ORAL_TABLET | Freq: Two times a day (BID) | ORAL | Status: DC
  Administered 2020-08-03 – 2020-08-09 (×9): 1 via ORAL
  Filled 2020-08-03 (×11): qty 1

## 2020-08-03 MED ORDER — MOMETASONE FURO-FORMOTEROL FUM 100-5 MCG/ACT IN AERO
2.0000 | INHALATION_SPRAY | Freq: Two times a day (BID) | RESPIRATORY_TRACT | Status: DC
  Administered 2020-08-03 – 2020-08-09 (×13): 2 via RESPIRATORY_TRACT
  Filled 2020-08-03: qty 8.8

## 2020-08-03 NOTE — Progress Notes (Signed)
Pt weaned to 3L sustaining O2 saturation of 95%. Pt previously on 4L.

## 2020-08-03 NOTE — Evaluation (Signed)
Physical Therapy Evaluation Patient Details Name: Samuel Barnes MRN: [id number] DOB: [birth year] Today's Date: 08/03/2020   History of Present Illness  70 y.o. male  admitted 7/22   who is being followed by cardiology for the evaluation of new onset atrial fibrillation.  Underwent cardioversion on 7/27.  [id number] artery calcifications, HTN, HLD, BPH s/p TURP, depression, and tobacco abuse  Clinical Impression  Pt treatment limited by fatigue. Pt sats dropped to 85% on room air prior to ambulating. Increased O2 to 4L to maintain sats >90% during ambulation. Pt demonstrated increased maintenance of balance with support from RW. Pt's deficits include decreased endurance, strength, balance, and activity tolerance. Pt would benefit from PT to improve mentioned deficits and function. Recommend HH upon d/c. Pt agreeable to PT recommendations.  HR during ambulation - 88-91bpm    Follow Up Recommendations Home health PT    Equipment Recommendations  Other (comment) (Possibly home O2)    Recommendations for Other Services       Precautions / Restrictions Precautions Precautions: Fall Restrictions Weight Bearing Restrictions: No      Mobility  Bed Mobility               General bed mobility comments: Pt EOB on arrival    Transfers Overall transfer level: Needs assistance Equipment used: Rolling walker (2 wheeled) Transfers: Sit to/from Stand Sit to Stand: Supervision         General transfer comment: Slow power up, however no LOB and use of bed rails. Pt used urinal on his own prior to standing to walk with PT.  Ambulation/Gait Ambulation/Gait assistance: Min guard;Min assist Gait Distance (Feet): 120 Feet Assistive device: Rolling walker (2 wheeled);None Gait Pattern/deviations: Step-through pattern;Decreased stride length Gait velocity: decreased Gait velocity interpretation: <1.31 ft/sec, indicative of household ambulator General Gait Details: pt  ambublated on 4L O2. Initial first 2 steps without RW, pt appeared very unsteady and was leaning against SPT. Pt's demonstrated increased steadiness with RW. Verbal cues provided to keep hips within RW. Intermittently demonstrated forward trunk lean.  Stairs            Wheelchair Mobility    Modified Rankin (Stroke Patients Only)       Balance Overall balance assessment: Needs assistance Sitting-balance support: Single extremity supported;Bilateral upper extremity supported Sitting balance-Leahy Scale: Fair Sitting balance - Comments: Intermittently alternates between UEs and uses BUEs for support while performing functional activity EOB   Standing balance support: Bilateral upper extremity supported;During functional activity   Standing balance comment: Demonstrated increased trunk flexion when standing without BUE support                             Pertinent Vitals/Pain Pain Assessment: No/denies pain    Home Living Family/patient expects to be discharged to:: Private residence Living Arrangements: Alone Available Help at Discharge:  (not available 24/7) Type of Home: House Home Access: Stairs to enter Entrance Stairs-Rails: None Entrance Stairs-Number of Steps: 1 step in the back; 6-7 in front but doesn't enter through front Home Layout: One level Home Equipment: Walker - 2 wheels;Grab bars - tub/shower (lift chair)      Prior Function Level of Independence: Independent         Comments: Has RW but did not use it     Hand Dominance   Dominant Hand: Right    Extremity/Trunk Assessment   Upper Extremity Assessment Upper Extremity Assessment: Defer to OT evaluation  Lower Extremity Assessment Lower Extremity Assessment: Generalized weakness    Cervical / Trunk Assessment Cervical / Trunk Assessment: Normal  Communication   Communication: No difficulties  Cognition Arousal/Alertness: Awake/alert Behavior During Therapy: WFL for tasks  assessed/performed Overall Cognitive Status: Within Functional Limits for tasks assessed                                        General Comments      Exercises General Exercises - Lower Extremity Long Arc Quad: AROM;Both;10 reps;Seated Hip Flexion/Marching: AROM;Both;10 reps;Seated (decreased strength and ROM on LLE)   Assessment/Plan    PT Assessment Patient needs continued PT services  PT Problem List Decreased strength;Decreased range of motion;Decreased balance;Decreased activity tolerance;Decreased mobility       PT Treatment Interventions Gait training;Therapeutic exercise;Therapeutic activities;Balance training;Stair training    PT Goals (Current goals can be found in the Care Plan section)  Acute Rehab PT Goals Patient Stated Goal: Return home and get stronger PT Goal Formulation: With patient Time For Goal Achievement: 08/17/20 Potential to Achieve Goals: Good    Frequency Min 3X/week   Barriers to discharge        Co-evaluation               AM-PAC PT "6 Clicks" Mobility  Outcome Measure Help needed turning from your back to your side while in a flat bed without using bedrails?: None Help needed moving from lying on your back to sitting on the side of a flat bed without using bedrails?: None Help needed moving to and from a bed to a chair (including a wheelchair)?: None Help needed standing up from a chair using your arms (e.g., wheelchair or bedside chair)?: None Help needed to walk in hospital room?: A Little Help needed climbing 3-5 steps with a railing? : A Lot 6 Click Score: 21    End of Session Equipment Utilized During Treatment: Gait belt;Oxygen Activity Tolerance: Patient limited by fatigue Patient left: in bed;with call bell/phone within reach;with chair alarm set (Seated position in bed) Nurse Communication: Mobility status PT Visit Diagnosis: Unsteadiness on feet (R26.81);Other abnormalities of gait and mobility  (R26.89);Muscle weakness (generalized) (M62.81)    Time: 1740-8144 PT Time Calculation (min) (ACUTE ONLY): 40 min   Charges:   PT Evaluation $PT Eval Moderate Complexity: 1 Mod PT Treatments $Gait Training: 8-22 mins $Therapeutic Exercise: 8-22 mins        Velda Shell, SPT Acute Rehab: [phone]   Vance Gather 08/03/2020, 1:18 PM

## 2020-08-03 NOTE — Progress Notes (Signed)
Patient ID: Samuel Barnes, male   DOB: Sep 21, 1950, 70 y.o.   MRN: [id number]  PROGRESS NOTE    Samuel Barnes  OZH:[id number] DOB: 1950/10/23 DOA: 07/28/2020 PCP: Pcp, No   Brief Narrative:  70 y.o. male with a history of aortic atherosclerosis, BPH, obesity, coronary artery calcification, depression, hyperlipidemia, hypertension, alcohol and tobacco use presented with worsening shortness of breath.  He was found to have acute diastolic heart failure, paroxysmal new onset atrial fibrillation and pleural effusion requiring oxygen supplementation.  He was started on IV Lasix and heparin.  Cardiology was consulted.  He also underwent right-sided thoracentesis.  Assessment & Plan:   Acute hypoxic respiratory failure -Possibly from heart failure exacerbation and pleural effusion -Required up to 8 L oxygen during this hospitalization.  Still requiring 6 L oxygen via nasal cannula.  Wean off as able. -Repeat chest x-ray today.  Acute probable diastolic heart failure -Echo showed normal EF however LV diastolic dysfunction could not be evaluated -Cardiology following.  Continue strict input and output.  Daily weights.  Continue IV Lasix.  Fluid restriction.  Continue metoprolol and losartan.  Negative balance of 5401.1 cc since admission  Paroxysmal A. fib, new onset -Currently rate controlled.  Currently still on IV heparin.   -Status post TEE/DCCV on 08/02/2020.  Continue metoprolol.  Follow further cardiology recommendations  Hyponatremia -Possibly from fluid overload.  Sodium 124 on admission. Still 129 today.  Monitor  Hypertension -Blood pressure stable.  Continue metoprolol, Lasix and losartan.  Amlodipine on hold.  Coronary artery calcification Hyperlipidemia -Continue statin.  On lovastatin as an outpatient  BPH -Continue terazosin  Alcohol use -No signs of withdrawal.  Continue thiamine, multivitamin and folic acid  Tobacco use -Smoking cessation discussed by  prior hospitalist.  Patient declined nicotine replacement therapy. -Outpatient follow-up.  Continue DuoNeb as needed  Fatty liver -Seen on imaging.  Likely related to alcohol consumption.  Outpatient follow-up  Generalized deconditioning -PT eval  Depression -Continue Zoloft  Hyperglycemia/prediabetes -A1c 5.7.  Outpatient follow-up.  DVT prophylaxis: Heparin Code Status: Full Family Communication: None at bedside Disposition Plan: Status is: Inpatient  Remains inpatient appropriate because:Inpatient level of care appropriate due to severity of illness  Dispo: The patient is from: Home              Anticipated d/c is to: Home in 1 to 2 days once cleared by cardiology and patient improves medically              Patient currently is not medically stable to d/c.   Difficult to place patient No  Consultants: Cardiology  Procedures: Echo TEE/DCCV on 08/02/2020  Antimicrobials: None   Subjective: Patient seen and examined at bedside.  Denies overnight fever, nausea or vomiting.  Still short of breath with exertion.  Denies current chest pain. Objective: Vitals:   08/02/20 2035 08/03/20 0436 08/03/20 0519 08/03/20 0613  BP: 125/72  137/83   Pulse: 76  75 75  Resp: 15  16   Temp: 97.7 F (36.5 C)  98.1 F (36.7 C)   TempSrc: Oral  Oral   SpO2: 96%  96% 96%  Weight:  97.4 kg    Height:        Intake/Output Summary (Last 24 hours) at 08/03/2020 0741 Last data filed at 08/02/2020 2312 Gross per 24 hour  Intake 921.66 ml  Output 700 ml  Net 221.66 ml    Filed Weights   08/01/20 0508 08/02/20 0500 08/03/20 0436  Weight: 97.1 kg  97.6 kg 97.4 kg    Examination:  General exam: No distress.  On 6 L oxygen via nasal cannula.   Respiratory system: Decreased breath sounds at bases bilaterally with some crackles  cardiovascular system: Rate controlled, S1-S2 heard Gastrointestinal system: Abdomen is slightly distended, soft and nontender.  Bowel sounds are  heard Extremities: Mild lower extremity edema present; no clubbing Central nervous system: Awake and alert.  No focal neurological deficits.  Moves extremities  skin: No obvious rashes/ecchymosis Psychiatry: Normal mood, affect and judgment    Data Reviewed: I have personally reviewed following labs and imaging studies  CBC: Recent Labs  Lab 07/28/20 1051 07/29/20 0453 07/30/20 0555 07/31/20 0347 08/01/20 0042 08/02/20 0225 08/03/20 0208  WBC 8.2   < > 7.2 7.8 9.2 8.0 9.3  NEUTROABS 6.7  --   --   --   --   --   --   HGB 15.8   < > 14.8 14.4 14.7 14.0 14.4  HCT 44.4   < > 42.2 42.8 42.7 41.6 42.8  MCV 93.1   < > 93.8 95.1 95.5 96.7 96.4  PLT 209   < > 176 160 186 178 213   < > = values in this interval not displayed.    Basic Metabolic Panel: Recent Labs  Lab 07/28/20 1051 07/29/20 0453 07/29/20 1137 07/30/20 0555 07/30/20 1418 07/31/20 0347 07/31/20 1734 08/01/20 0042 08/02/20 0225 08/03/20 0208  NA 124* 124*   < > 126*   < > 125* 129* 129* 129* 129*  K 4.3 4.9   < > 3.8   < > 3.9 3.9 3.7 3.7 4.3  CL 84* 87*   < > 87*   < > 88* 89* 89* 91* 90*  CO2 30 27   < > 28   < > 29 31 31 29  33*  GLUCOSE 130* 112*   < > 102*   < > 109* 100* 103* 98 118*  BUN 10 8   < > 9   < > 10 11 15 11 11   CREATININE 0.50* 0.51*   < > 0.49*   < > 0.48* 0.60* 0.66 0.51* 0.54*  CALCIUM 8.7* 8.7*   < > 8.8*   < > 8.6* 9.2 8.8* 8.6* 8.9  MG 1.6* 1.9  --  1.7  --   --   --   --   --   --    < > = values in this interval not displayed.    GFR: Estimated Creatinine Clearance: 95.8 mL/min (A) (by C-G formula based on SCr of 0.54 mg/dL (L)). Liver Function Tests: Recent Labs  Lab 07/28/20 1051 07/31/20 1734  AST 19  --   ALT 14  --   ALKPHOS 102  --   BILITOT 1.0  --   PROT 7.2 6.4*  ALBUMIN 4.1  --     No results for input(s): LIPASE, AMYLASE in the last 168 hours. No results for input(s): AMMONIA in the last 168 hours. Coagulation Profile: No results for input(s): INR,  PROTIME in the last 168 hours. Cardiac Enzymes: No results for input(s): CKTOTAL, CKMB, CKMBINDEX, TROPONINI in the last 168 hours. BNP (last 3 results) No results for input(s): PROBNP in the last 8760 hours. HbA1C: No results for input(s): HGBA1C in the last 72 hours. CBG: No results for input(s): GLUCAP in the last 168 hours. Lipid Profile: No results for input(s): CHOL, HDL, LDLCALC, TRIG, CHOLHDL, LDLDIRECT in the last 72 hours. Thyroid Function Tests:  No results for input(s): TSH, T4TOTAL, FREET4, T3FREE, THYROIDAB in the last 72 hours. Anemia Panel: No results for input(s): VITAMINB12, FOLATE, FERRITIN, TIBC, IRON, RETICCTPCT in the last 72 hours. Sepsis Labs: Recent Labs  Lab 07/28/20 1051 07/28/20 1249  LATICACIDVEN 2.1* 0.8     Recent Results (from the past 240 hour(s))  Resp Panel by RT-PCR (Flu A&B, Covid) Nasopharyngeal Swab     Status: None   Collection Time: 07/28/20 10:51 AM   Specimen: Nasopharyngeal Swab; Nasopharyngeal(NP) swabs in vial transport medium  Result Value Ref Range Status   SARS Coronavirus 2 by RT PCR NEGATIVE NEGATIVE Final    Comment: (NOTE) SARS-CoV-2 target nucleic acids are NOT DETECTED.  The SARS-CoV-2 RNA is generally detectable in upper respiratory specimens during the acute phase of infection. The lowest concentration of SARS-CoV-2 viral copies this assay can detect is 138 copies/mL. A negative result does not preclude SARS-Cov-2 infection and should not be used as the sole basis for treatment or other patient management decisions. A negative result may occur with  improper specimen collection/handling, submission of specimen other than nasopharyngeal swab, presence of viral mutation(s) within the areas targeted by this assay, and inadequate number of viral copies(<138 copies/mL). A negative result must be combined with clinical observations, patient history, and epidemiological information. The expected result is Negative.  Fact  Sheet for Patients:  BloggerCourse.com  Fact Sheet for Healthcare Providers:  SeriousBroker.it  This test is no t yet approved or cleared by the [place] FDA and  has been authorized for detection and/or diagnosis of SARS-CoV-2 by FDA under an Emergency Use Authorization (EUA). This EUA will remain  in effect (meaning this test can be used) for the duration of the COVID-19 declaration under Section 564(b)(1) of the Act, 21 U.S.C.section 360bbb-3(b)(1), unless the authorization is terminated  or revoked sooner.       Influenza A by PCR NEGATIVE NEGATIVE Final   Influenza B by PCR NEGATIVE NEGATIVE Final    Comment: (NOTE) The Xpert Xpress SARS-CoV-2/FLU/RSV plus assay is intended as an aid in the diagnosis of influenza from Nasopharyngeal swab specimens and should not be used as a sole basis for treatment. Nasal washings and aspirates are unacceptable for Xpert Xpress SARS-CoV-2/FLU/RSV testing.  Fact Sheet for Patients: BloggerCourse.com  Fact Sheet for Healthcare Providers: SeriousBroker.it  This test is not yet approved or cleared by the [place] FDA and has been authorized for detection and/or diagnosis of SARS-CoV-2 by FDA under an Emergency Use Authorization (EUA). This EUA will remain in effect (meaning this test can be used) for the duration of the COVID-19 declaration under Section 564(b)(1) of the Act, 21 U.S.C. section 360bbb-3(b)(1), unless the authorization is terminated or revoked.  Performed at [facility], [address] [place], [place] [address]   Blood culture (routine x 2)     Status: None   Collection Time: 07/28/20  2:43 PM   Specimen: Left Antecubital; Blood  Result Value Ref Range Status   Specimen Description   Final    LEFT ANTECUBITAL BLOOD Performed at [facility], [address] [place], [place] [address]     Special Requests   Final    BOTTLES DRAWN AEROBIC AND ANAEROBIC Blood Culture adequate volume Performed at [facility], [address] [place], [place] [address]    Culture   Final    NO GROWTH 5 DAYS Performed at [facility], [address],  [place], [place] [address]    Report Status 08/02/2020 FINAL  Final  Blood culture (routine x 2)     Status: None   Collection Time: 07/28/20  2:53 PM   Specimen: BLOOD RIGHT HAND  Result Value Ref Range Status   Specimen Description   Final    BLOOD RIGHT HAND BLOOD Performed at [facility], [address] [place], [place] [address]    Special Requests   Final    BOTTLES DRAWN AEROBIC AND ANAEROBIC Blood Culture adequate volume Performed at [facility], [address] [place], [place] [address]    Culture   Final    NO GROWTH 5 DAYS Performed at [facility], [address], [place], [place] [address]    Report Status 08/02/2020 FINAL  Final  C Difficile Quick Screen w PCR reflex     Status: None   Collection Time: 07/28/20 11:55 PM   Specimen: STOOL  Result Value Ref Range Status   C Diff antigen NEGATIVE NEGATIVE Final   C Diff toxin NEGATIVE NEGATIVE Final   C Diff interpretation No C. difficile detected.  Final    Comment: Performed at [facility], [address] [place], [place] [address]  Body fluid culture w Gram Stain     Status: None (Preliminary result)   Collection Time: 07/31/20  4:16 PM   Specimen: Lung, Right; Pleural Fluid  Result Value Ref Range Status   Specimen Description PLEURAL FLUID  Final   Special Requests LUNG RIGHT  Final   Gram Stain   Final    FEW WBC PRESENT, PREDOMINANTLY MONONUCLEAR NO ORGANISMS SEEN    Culture   Final    NO GROWTH 2 DAYS Performed at [facility], [address], [place], [place] [address]    Report Status PENDING  Incomplete          Radiology Studies: ECHO TEE  Result Date: 08/02/2020    TRANSESOPHOGEAL  ECHO REPORT   Patient Name:   Samuel Barnes Date of Exam: 08/02/2020 Medical Rec #:  [id number]             Height:       68.0 in Accession #:    [id number]            Weight:       215.2 lb Date of Birth:  [birth year]             BSA:          2.108 m Patient Age:    70 years              BP:           144/77 mmHg Patient Gender: M                     HR:           48 bpm. Exam Location:  Inpatient Procedure: Transesophageal Echo, 3D Echo, Cardiac Doppler and Color Doppler Indications:     I48.1 Persistent atrial fibrillation  History:         Patient has prior history of Echocardiogram examinations, most                  recent 07/30/2020. Risk Factors:Hypertension, Dyslipidemia and                  Current Smoker. Acute hypoxic respiratory failure likely 2/2  acute CHF. ETOH Abuse.  Sonographer:     Leta Jungling RDCS Referring Phys:  [id number] Beatriz Stallion Diagnosing Phys: Laurance Flatten MD PROCEDURE: After discussion of the risks and benefits of a TEE, an informed consent was obtained from the patient. The transesophogeal probe was passed without difficulty through the esophogus of the patient. Imaged were obtained with the patient in a left lateral decubitus position. Sedation performed by different physician. The patient was monitored while under deep sedation. Anesthestetic sedation was provided intravenously by Anesthesiology: 225.92mg  of Propofol,  of Lidocaine. Image quality was good. The patient's vital signs; including heart rate, blood pressure, and oxygen saturation; remained stable throughout the procedure. The patient developed no complications during the procedure. IMPRESSIONS  1. Left ventricular ejection fraction, by estimation, is 55 to 60%. The left ventricle has normal function.  2. Right ventricular systolic function is normal. The right ventricular size is normal.  3. Left atrial size was mild to moderately dilated. No left atrial/left atrial appendage thrombus  was detected.  4. Right atrial size was moderately dilated.  5. The mitral valve is normal in structure. Mild mitral valve regurgitation. No evidence of mitral stenosis.  6. The aortic valve is tricuspid. There is mild calcification of the aortic valve. There is mild thickening of the aortic valve. Aortic valve regurgitation is not visualized. Mild aortic valve sclerosis is present, with no evidence of aortic valve stenosis.  7. There is Moderate (Grade III) plaque.  8. Successful DCCV performed (200J x1) after TEE with return to NSR. Please see separate note. FINDINGS  Left Ventricle: Left ventricular ejection fraction, by estimation, is 55 to 60%. The left ventricle has normal function. The left ventricular internal cavity size was normal in size. Right Ventricle: The right ventricular size is normal. No increase in right ventricular wall thickness. Right ventricular systolic function is normal. Left Atrium: Left atrial size was mild to moderately dilated. No left atrial/left atrial appendage thrombus was detected. Right Atrium: Right atrial size was moderately dilated. Pericardium: There is no evidence of pericardial effusion. Mitral Valve: The mitral valve is normal in structure. There is mild thickening of the mitral valve leaflet(s). There is mild calcification of the mitral valve leaflet(s). Mild mitral valve regurgitation. No evidence of mitral valve stenosis. Tricuspid Valve: The tricuspid valve is normal in structure. Tricuspid valve regurgitation is mild. Aortic Valve: The aortic valve is tricuspid. There is mild calcification of the aortic valve. There is mild thickening of the aortic valve. Aortic valve regurgitation is not visualized. Mild aortic valve sclerosis is present, with no evidence of aortic valve stenosis. Pulmonic Valve: The pulmonic valve was normal in structure. Pulmonic valve regurgitation is trivial. Aorta: The aortic root and ascending aorta are structurally normal, with no evidence of  dilitation. There is moderate (Grade III) plaque. IAS/Shunts: No atrial level shunt detected by color flow Doppler.  LEFT VENTRICLE PLAX 2D LVOT diam:     2.00 cm LVOT Area:     3.14 cm   AORTA Ao Root diam: 3.20 cm Ao Asc diam:  3.50 cm TRICUSPID VALVE TR Peak grad:   16.8 mmHg TR Vmax:        205.00 cm/s  SHUNTS Systemic Diam: 2.00 cm Laurance Flatten MD Electronically signed by Laurance Flatten MD Signature Date/Time: 08/02/2020/4:16:15 PM    Final         Scheduled Meds:  FLUoxetine  20 mg Oral QHS   folic acid  1 mg Oral Daily   furosemide  40 mg Intravenous BID   losartan  50 mg Oral Daily   metoprolol tartrate  50 mg Oral BID   montelukast  10 mg Oral Daily   multivitamin with minerals  1 tablet Oral Daily   potassium chloride  20 mEq Oral Daily   pravastatin  20 mg Oral q1800   terazosin  5 mg Oral Daily   thiamine  100 mg Oral Daily   Continuous Infusions:  heparin 1,800 Units/hr (08/02/20 2315)          Glade LloydKshitiz Kerem Gilmer, MD [facility] 08/03/2020, 7:41 AM

## 2020-08-03 NOTE — Progress Notes (Addendum)
SATURATION QUALIFICATIONS: (This note is used to comply with regulatory documentation for home oxygen)  Patient Saturations on Room Air at Rest = 91%  Patient Saturations on Room Air while Standing= 85%  Patient Saturations on 4 Liters of oxygen while Ambulating = 91%  Please briefly explain why patient needs home oxygen: Pt desats on room air with activity. Will need home O2. Thanks!  Velda Shell, SPT Acute Rehab: [phone]

## 2020-08-03 NOTE — Discharge Instructions (Addendum)
Heart Failure Education: Weigh yourself EVERY morning after you go to the bathroom but before you eat or drink anything. Write this number down in a weight log/diary. If you gain 3 pounds overnight or 5 pounds in a week, call the office. Take your medicines as prescribed. If you have concerns about your medications, please call us before you stop taking them.  Eat low salt foods--Limit salt (sodium) to 2000 mg per day. This will help prevent your body from holding onto fluid. Read food labels as many processed foods have a lot of sodium, especially canned goods and prepackaged meats. If you would like some assistance choosing low sodium foods, we would be happy to set you up with a nutritionist. Stay as active as you can everyday. Staying active will give you more energy and make your muscles stronger. Start with 5 minutes at a time and work your way up to 30 minutes a day. Break up your activities--do some in the morning and some in the afternoon. Start with 3 days per week and work your way up to 5 days as you can.  If you have chest pain, feel short of breath, dizzy, or lightheaded, STOP. If you don't feel better after a short rest, call 911. If you do feel better, call the office to let us know you have symptoms with exercise. Limit all fluids for the day to less than 2 liters. Fluid includes all drinks, coffee, alcohol, juice, ice chips, soup, jello, and all other liquids.    Information on my medicine - ELIQUIS (apixaban)  This medication education was reviewed with me or my healthcare representative as part of my discharge preparation.    Why was Eliquis prescribed for you? Eliquis was prescribed for you to reduce the risk of a blood clot forming that can cause a stroke if you have a medical condition called atrial fibrillation (a type of irregular heartbeat).  What do You need to know about Eliquis ? Take your Eliquis TWICE DAILY - one tablet in the morning and one tablet in the evening  with or without food. If you have difficulty swallowing the tablet whole please discuss with your pharmacist how to take the medication safely.  Take Eliquis exactly as prescribed by your doctor and DO NOT stop taking Eliquis without talking to the doctor who prescribed the medication.  Stopping may increase your risk of developing a stroke.  Refill your prescription before you run out.  After discharge, you should have regular check-up appointments with your healthcare provider that is prescribing your Eliquis.  In the future your dose may need to be changed if your kidney function or weight changes by a significant amount or as you get older.  What do you do if you miss a dose? If you miss a dose, take it as soon as you remember on the same day and resume taking twice daily.  Do not take more than one dose of ELIQUIS at the same time to make up a missed dose.  Important Safety Information A possible side effect of Eliquis is bleeding. You should call your healthcare provider right away if you experience any of the following: Bleeding from an injury or your nose that does not stop. Unusual colored urine (red or dark brown) or unusual colored stools (red or black). Unusual bruising for unknown reasons. A serious fall or if you hit your head (even if there is no bleeding).  Some medicines may interact with Eliquis and might increase your  risk of bleeding or clotting while on Eliquis. To help avoid this, consult your healthcare provider or pharmacist prior to using any new prescription or non-prescription medications, including herbals, vitamins, non-steroidal anti-inflammatory drugs (NSAIDs) and supplements.  This website has more information on Eliquis (apixaban): http://www.eliquis.com/eliquis/home

## 2020-08-03 NOTE — Progress Notes (Signed)
Pt weaned down to 4L sustaining O2 of 95%. Pt previously on 6L.

## 2020-08-03 NOTE — TOC Initial Note (Signed)
Transition of Care [facility]) - Initial/Assessment Note    Patient Details  Name: Samuel Barnes MRN: [id number] Date of Birth: [birth year]  Transition of Care [facility]) CM/SW Contact:    Bethann Berkshire, [place] Phone Number: 08/03/2020, 2:20 PM  Clinical Narrative:                  CSW met with pt and pt daughter bedside. Pt lives at home alone in [facility]. Pt is agreeable to [facility] PT/OT. CSW will attempt to get RN for CHF as well. Pt  has no preference in [facility] agency. Pt is hoping to wean from O2 but has no preference on O2 company if home O2 needed. CSW will arrange [facility] and arrange O2 if needed.   Expected Discharge Plan: [place] Barriers to Discharge: Continued Medical Work up   Patient Goals and CMS Choice Patient states their goals for this hospitalization and ongoing recovery are:: Wants to wean off of oxygen   Choice offered to / list presented to : Patient  Expected Discharge Plan and Services Expected Discharge Plan: [place]       Living arrangements for the past 2 months: Single Family Home                                      Prior Living Arrangements/Services Living arrangements for the past 2 months: Single Family Home Lives with:: Self Patient language and need for interpreter reviewed:: Yes Do you feel safe going back to the place where you live?: Yes      Need for Family Participation in Patient Care: No (Comment) Care giver support system in place?: Yes (comment)   Criminal Activity/Legal Involvement Pertinent to Current Situation/Hospitalization: No - Comment as needed  Activities of Daily Living Home Assistive Devices/Equipment: None ADL Screening (condition at time of admission) Patient's cognitive ability adequate to safely complete daily activities?: Yes Is the patient deaf or have difficulty hearing?: No Does the patient have difficulty seeing, even when wearing glasses/contacts?: No Does the patient have  difficulty concentrating, remembering, or making decisions?: No Patient able to express need for assistance with ADLs?: Yes Does the patient have difficulty dressing or bathing?: No Independently performs ADLs?: Yes (appropriate for developmental age) Does the patient have difficulty walking or climbing stairs?: No Weakness of Legs: None Weakness of Arms/Hands: None  Permission Sought/Granted   Permission granted to share information with : Yes, Verbal Permission Granted  Share Information with NAME: Liliane Channel (Daughter)   [phone] (Mobile)           Emotional Assessment Appearance:: Appears stated age Attitude/Demeanor/Rapport: Engaged Affect (typically observed): Accepting Orientation: : Oriented to Self, Oriented to Place, Oriented to  Time, Oriented to Situation Alcohol / Substance Use: Not Applicable Psych Involvement: No (comment)  Admission diagnosis:  Cough [R05.9] Pleural effusion [J90] Hypoxia [R09.02] New onset a-fib (HCC) [I48.91] Acute respiratory failure with hypoxia ([place]) [J96.01] Community acquired pneumonia, unspecified laterality [J18.9] Patient Active Problem List   Diagnosis Date Noted   Hyperglycemia 07/29/2020   New onset a-fib [facility])    Acute congestive heart failure ([place])    Acute respiratory failure with hypoxia ([place]) 07/28/2020   Pericardial effusion 07/28/2020   Aortic atherosclerosis ([place]) 07/28/2020   Coronary artery calcification 07/28/2020   Class 2 obesity 07/28/2020   Hyperlipidemia 07/28/2020   Tobacco use 07/28/2020   Prolonged QT  interval 07/28/2020   Hypocalcemia 07/28/2020   Alcohol abuse 07/28/2020   Fatty liver 07/28/2020   BPH (benign prostatic hyperplasia)    Depression    Hypertension    Hyponatremia    Hypomagnesemia    New onset atrial fibrillation ([place])    PCP:  Pcp, No Pharmacy:   [id number] [place] - [address] [place]  [place] [id number]: [phone]: [phone]    Social Determinants of Health (SDOH) Interventions    Readmission Risk Interventions No flowsheet data found.

## 2020-08-03 NOTE — Progress Notes (Signed)
ANTICOAGULATION CONSULT NOTE - Follow Up Consult  Pharmacy Consult for apixaban Indication: atrial fibrillation  Allergies  Allergen Reactions   Ciprofloxacin Shortness Of Breath and Other (See Comments)    Numbness in extremities    Gabapentin Other (See Comments)    Mental problems, depression, anger    Patient Measurements: Height: 5\' 8"  (172.7 cm) Weight: 93.9 kg (207 lb 0.2 oz) IBW/kg (Calculated) : 68.4 Heparin Dosing Weight: 91.3 kg  Vital Signs: Temp: 98.1 F (36.7 C) (07/28 0519) Temp Source: Oral (07/28 0519) BP: 137/83 (07/28 0519) Pulse Rate: 85 (07/28 0900)  Labs: Recent Labs    08/01/20 0042 08/02/20 0225 08/03/20 0208  HGB 14.7 14.0 14.4  HCT 42.7 41.6 42.8  PLT 186 178 213  HEPARINUNFRC 0.35 0.32 0.23*  CREATININE 0.66 0.51* 0.54*     Estimated Creatinine Clearance: 94.2 mL/min (A) (by C-G formula based on SCr of 0.54 mg/dL (L)).  Assessment: 70 YOM presented with fatigue, SOB, with new onset afib. He was not previously on anticoagulation. Patient was on heparin gtt 7/22-7/28 and was cardioverted 7/27. Pharmacy was then consulted to transition the patient to apixaban.  CBC stable within normal. No signs or symptoms of bleeding noted.   Patient is <70 YO, >60 kg, and has stable renal function. apixaban 5mg  BID is appropriate for the patients age, weight, and renal function.   Goal of Therapy:  Monitor platelets by anticoagulation protocol: Yes   Plan:  Start apixaban 5 mg BID 7/28 DC heparin gtt  Monitor CBC, signs/symptoms of bleeding, renal function   , Pharm.D. PGY-1 Ambulatory Care Resident Phone:[phone] 08/03/2020 9:20 AM

## 2020-08-03 NOTE — Progress Notes (Addendum)
Progress Note  Patient Name: Samuel Barnes P Casimir Date of Encounter: 08/03/2020  [facility] Cardiologist: Armanda Magicraci Turner, MD   Subjective   Overall feeling better despite not sleeping well last night due to MSK aches/pains. Breathing is much improved from admission. No complaints of chest pain. Provided CHF education today.    Inpatient Medications    Scheduled Meds:  FLUoxetine  20 mg Oral QHS   folic acid  1 mg Oral Daily   furosemide  40 mg Intravenous BID   losartan  50 mg Oral Daily   metoprolol tartrate  50 mg Oral BID   montelukast  10 mg Oral Daily   multivitamin with minerals  1 tablet Oral Daily   potassium chloride  20 mEq Oral Daily   pravastatin  20 mg Oral q1800   terazosin  5 mg Oral Daily   thiamine  100 mg Oral Daily   Continuous Infusions:  heparin 1,800 Units/hr (08/02/20 2315)   PRN Meds: acetaminophen **OR** acetaminophen, ipratropium-albuterol, lidocaine, prochlorperazine   Vital Signs    Vitals:   08/03/20 0436 08/03/20 0519 08/03/20 0613 08/03/20 0740  BP:  137/83    Pulse:  75 75   Resp:  16    Temp:  98.1 F (36.7 C)    TempSrc:  Oral    SpO2:  96% 96%   Weight: 97.4 kg   93.9 kg  Height:        Intake/Output Summary (Last 24 hours) at 08/03/2020 0839 Last data filed at 08/02/2020 2312 Gross per 24 hour  Intake 921.66 ml  Output 700 ml  Net 221.66 ml   Last 3 Weights 08/03/2020 08/03/2020 08/02/2020  Weight (lbs) 207 lb 0.2 oz 214 lb 11.7 oz 215 lb 2.7 oz  Weight (kg) 93.9 kg 97.4 kg 97.6 kg      Telemetry    Sinus rhythm with occasional PACs - Personally Reviewed  ECG    No new tracings - Personally Reviewed  Physical Exam   GEN: No acute distress.   Neck: No JVD Cardiac: RRR, no murmurs, rubs, or gallops.  Respiratory: Decreased breath sounds at lung bases without wheezing, rhonchi, or rales GI: Soft, obese, nontender, non-distended  MS: No edema; No deformity. Neuro:  Nonfocal  Psych: Normal affect   Labs     High Sensitivity Troponin:   Recent Labs  Lab 07/28/20 1051 07/28/20 1249  TROPONINIHS 2 2      Chemistry Recent Labs  Lab 07/28/20 1051 07/29/20 0453 07/31/20 1734 08/01/20 0042 08/02/20 0225 08/03/20 0208  NA 124*   < > 129* 129* 129* 129*  K 4.3   < > 3.9 3.7 3.7 4.3  CL 84*   < > 89* 89* 91* 90*  CO2 30   < > 31 31 29  33*  GLUCOSE 130*   < > 100* 103* 98 118*  BUN 10   < > 11 15 11 11   CREATININE 0.50*   < > 0.60* 0.66 0.51* 0.54*  CALCIUM 8.7*   < > 9.2 8.8* 8.6* 8.9  PROT 7.2  --  6.4*  --   --   --   ALBUMIN 4.1  --   --   --   --   --   AST 19  --   --   --   --   --   ALT 14  --   --   --   --   --   ALKPHOS 102  --   --   --   --   --  BILITOT 1.0  --   --   --   --   --   GFRNONAA >60   < > >60 >60 >60 >60  ANIONGAP 10   < > 9 9 9 6    < > = values in this interval not displayed.     Hematology Recent Labs  Lab 08/01/20 0042 08/02/20 0225 08/03/20 0208  WBC 9.2 8.0 9.3  RBC 4.47 4.30 4.44  HGB 14.7 14.0 14.4  HCT 42.7 41.6 42.8  MCV 95.5 96.7 96.4  MCH 32.9 32.6 32.4  MCHC 34.4 33.7 33.6  RDW 13.0 13.1 13.1  PLT 186 178 213    BNP Recent Labs  Lab 07/28/20 1051 07/31/20 1037  BNP 302.6* 245.9*     DDimer  Recent Labs  Lab 07/28/20 1051  DDIMER 1.21*     Radiology    ECHO TEE  Result Date: 08/02/2020    TRANSESOPHOGEAL ECHO REPORT   Patient Name:   [name] Date of Exam: 08/02/2020 Medical Rec #:  08/04/2020             Height:       68.0 in Accession #:    [id number]            Weight:       215.2 lb Date of Birth:  1950-10-24             BSA:          2.108 m Patient Age:    70 years              BP:           144/77 mmHg Patient Gender: M                     HR:           48 bpm. Exam Location:  Inpatient Procedure: Transesophageal Echo, 3D Echo, Cardiac Doppler and Color Doppler Indications:     I48.1 Persistent atrial fibrillation  History:         Patient has prior history of Echocardiogram examinations, most                   recent 07/30/2020. Risk Factors:Hypertension, Dyslipidemia and                  Current Smoker. Acute hypoxic respiratory failure likely 2/2                  acute CHF. ETOH Abuse.  Sonographer:     08/01/2020 RDCS Referring Phys:  Leta Jungling [id number] Diagnosing Phys: Beatriz Stallion MD PROCEDURE: After discussion of the risks and benefits of a TEE, an informed consent was obtained from the patient. The transesophogeal probe was passed without difficulty through the esophogus of the patient. Imaged were obtained with the patient in a left lateral decubitus position. Sedation performed by different physician. The patient was monitored while under deep sedation. Anesthestetic sedation was provided intravenously by Anesthesiology: 225.92mg  of Propofol, 30mg  of Lidocaine. Image quality was good. The patient's vital signs; including heart rate, blood pressure, and oxygen saturation; remained stable throughout the procedure. The patient developed no complications during the procedure. IMPRESSIONS  1. Left ventricular ejection fraction, by estimation, is 55 to 60%. The left ventricle has normal function.  2. Right ventricular systolic function is normal. The right ventricular size is normal.  3. Left atrial size was mild to moderately dilated. No left  atrial/left atrial appendage thrombus was detected.  4. Right atrial size was moderately dilated.  5. The mitral valve is normal in structure. Mild mitral valve regurgitation. No evidence of mitral stenosis.  6. The aortic valve is tricuspid. There is mild calcification of the aortic valve. There is mild thickening of the aortic valve. Aortic valve regurgitation is not visualized. Mild aortic valve sclerosis is present, with no evidence of aortic valve stenosis.  7. There is Moderate (Grade III) plaque.  8. Successful DCCV performed (200J x1) after TEE with return to NSR. Please see separate note. FINDINGS  Left Ventricle: Left ventricular ejection  fraction, by estimation, is 55 to 60%. The left ventricle has normal function. The left ventricular internal cavity size was normal in size. Right Ventricle: The right ventricular size is normal. No increase in right ventricular wall thickness. Right ventricular systolic function is normal. Left Atrium: Left atrial size was mild to moderately dilated. No left atrial/left atrial appendage thrombus was detected. Right Atrium: Right atrial size was moderately dilated. Pericardium: There is no evidence of pericardial effusion. Mitral Valve: The mitral valve is normal in structure. There is mild thickening of the mitral valve leaflet(s). There is mild calcification of the mitral valve leaflet(s). Mild mitral valve regurgitation. No evidence of mitral valve stenosis. Tricuspid Valve: The tricuspid valve is normal in structure. Tricuspid valve regurgitation is mild. Aortic Valve: The aortic valve is tricuspid. There is mild calcification of the aortic valve. There is mild thickening of the aortic valve. Aortic valve regurgitation is not visualized. Mild aortic valve sclerosis is present, with no evidence of aortic valve stenosis. Pulmonic Valve: The pulmonic valve was normal in structure. Pulmonic valve regurgitation is trivial. Aorta: The aortic root and ascending aorta are structurally normal, with no evidence of dilitation. There is moderate (Grade III) plaque. IAS/Shunts: No atrial level shunt detected by color flow Doppler.  LEFT VENTRICLE PLAX 2D LVOT diam:     2.00 cm LVOT Area:     3.14 cm   AORTA Ao Root diam: 3.20 cm Ao Asc diam:  3.50 cm TRICUSPID VALVE TR Peak grad:   16.8 mmHg TR Vmax:        205.00 cm/s  SHUNTS Systemic Diam: 2.00 cm Laurance Flatten MD Electronically signed by Laurance Flatten MD Signature Date/Time: 08/02/2020/4:16:15 PM    Final     Cardiac Studies   2D echo 07/2020 IMPRESSIONS    1. Left ventricular ejection fraction, by estimation, is 55 to 60%. The  left ventricle has normal  function. The left ventricle has no regional  wall motion abnormalities. Left ventricular diastolic function could not  be evaluated.   2. Prominent moderator band of no clinical significance. Right  ventricular systolic function is mildly reduced. The right ventricular  size is normal. There is mildly elevated pulmonary artery systolic  pressure. The estimated right ventricular systolic  pressure is 36.5 mmHg.   3. The mitral valve is normal in structure. No evidence of mitral valve  regurgitation. No evidence of mitral stenosis.   4. The aortic valve is normal in structure. Aortic valve regurgitation is  not visualized. No aortic stenosis is present.   5. The inferior vena cava is dilated in size with <50% respiratory  variability, suggesting right atrial pressure of 15 mmHg.  Patient Profile     70 y.o. male  with a PMH of coronary artery calcifications, HTN, HLD, BPH s/p TURP, depression, and tobacco abuse,  who is being followed by cardiology for  the evaluation of new onset atrial fibrillation  Assessment & Plan    1. Acute hypoxic respiratory failure likely 2/2 acute diastolic CHF: patient presented with several days of worsening SOB, cough, fatigue. He was hypoxic to 87% on arrival which improved with O2 via [place]. EKG with non-specific ST-T wave abnormalities. BNP 302. HsTrop negative x2. Found to have mild-moderate pleural effusions and mild pericardial effusion on CT but negative for PE. Echocardiogram showed EF 55-60%, no RWMA, mildly elevated PA pressures, and no significant valvular abnormalities. High suspicion for underlying COPD +/- OSA which may be contributing to his presentation. He underwent US guided right thoracentesis 07/31/20 with 1.5L removed. NST was negative for ischemia. Suspect Afib was driving his volume overload. He underwent successful TEE/DCCV 08/02/20 with restoration of NSR. He continues to diurese with IV lasix  BID with UOP +218mL in the past 24 hours and  -5.4L this admission, though weight continues to decrease from 231lbs on admission down to 207lbs today. Still on 5L O2 via [place] with O2 sats at 93% at the time of my evaluation - Given ongoing O2 needs, will continue IV lasix today - hopeful to wean O2 and transition to po lasix in the next 24 hours. He notes improvement in breathing with nebulizers - Will consolidate metoprolol to succinate this morning.  - Continue losartan - Continue supportive care per primary team - Would benefit from referral to pulmonology at discharge for COPD work-up/management   2. New onset atrial fibrillation: no prior history. EKG with rate controlled atrial fibrillation. TSH is wnl. He was started on metoprolol tartrate  BID (on succinate  daily at home) for rate control and a heparin gtt for stroke ppx. Echo with EF 55-60%, mild-moderate LAE. He underwent TEE/DCCV 08/02/20 with successful restoration of NSR.  - CHA2DS2-VASc Score = 4 [CHF History: Yes, HTN History: Yes, Diabetes History: No, Stroke History: No, Vascular Disease History: Yes, Age Score: 1, Gender Score: 0].  Therefore, the patient's annual risk of stroke is 4.8 %.    - Will transition from heparin gtt to eliquis  BID today for stroke ppx - Continue metoprolol for rate control - consolidate to succinate at discharge - Would benefit from an outpatient sleep study to r/o OSA   3. Coronary artery calcifications: noted on prior CT scans. Has not had any ischemic testing. Risk factors for obstructive CAD include HTN, HLD, and tobacco abuse. Echo with EF 55-60% and no RWMA. NST negative this admission. Aspirin not initiated due to need for anticoagulation - Continue statin and Bblocker  - Continue aggressive risk factor modifications - goal BP <130/80, LDL <70, A1C <7, smoking cessation   4. HTN: BP generally elevated. Home amlodipine stopped and he was started on metoprolol tartrate  BID (succinate  daily at home) and Lisinopril which was  changed to Losartan - BP controlled on exam - Continue Lopressor  BID and transition to Toprol at discharge - Continue Losartan  daily   5. HLD: LDL 33 this admission on lovastatin at home and pravastatin this admission (hospital formulary) - Continue lovastatin at discharge  6. Hyponatremia: Na 124 on admission. Likely hypervolemic - Continue to monitor closely with diuresis as above    7. Tobacco abuse: smokes 1ppd which he has for the past 50 years. Cessation advised. Suspicions high for underlying COPD - Continue to encourage cessation   8. ETOH abuse: drinks 3-4 cocktails 3-4 days per week. Discussed relationship between ETOH abuse and Afib. - Continue to encourage  cessation     For questions or updates, please contact [facility] Please consult [email or web address] for contact info under        Signed, Beatriz Stallion, PA-C  08/03/2020, 8:39 AM    Pt seen and examined   I have revewied and agree with assessment of K Kroeger above. Patient appears comfortable but on 5L O2 (not on O2 prior to admit) On exam JVP is increased Lungs with Decreased BS at bases  Moving air Cardiac exam:   RRR  No signif murmurs  Abd  Supple  No hepatomegaly  Ext are with triv edema  Atrial fibrillation   TEE yesterday without thrombus   Pt underwent cardiversion   has maintained SR   Consolidating meds   Acute diastolic CHF  Prob due to Afib   CXR today still shows pleural effusions   I would increase diuesis today   (80 mg tid)   Follow labs and BP   Note that pt did not use O2 at home  Needs are high now      Pulm  Probably signif COPD but still with increased volume   CAD  No symtpoms to sugg angina   WIll continue to follow   Dietrich Pates MD

## 2020-08-03 NOTE — TOC Benefit Eligibility Note (Signed)
Transition of Care [facility]) Benefit Eligibility Note    Patient Details  Name: Samuel Barnes MRN: [id number] Date of Birth: [birth year]   Medication/Dose: ELIQUIS  5 MG BID : CO-PAY-  $ 47.00       and     XARELTO 20 MG DAILY  CO-PAY- $ 47.00  Covered?: Yes  Tier: 3 Drug  Prescription Coverage [facility]  Spoke with Person/Company/Phone Number:: JASMINE  @ [employer] # [phone]  Co-Pay: $47.00  Prior Approval: No  Deductible: Met (OUT-OF-POCKET:UNMET)  Additional Notes: WARFARIN 10 MG DAILY : COVER- YES , [place] , TIER- 1  DRUG , P/A -NO    Memory Argue Phone Number: 08/03/2020, 3:32 PM

## 2020-08-04 DIAGNOSIS — J9601 Acute respiratory failure with hypoxia: Secondary | ICD-10-CM | POA: Diagnosis not present

## 2020-08-04 DIAGNOSIS — I5031 Acute diastolic (congestive) heart failure: Secondary | ICD-10-CM | POA: Diagnosis not present

## 2020-08-04 DIAGNOSIS — I4891 Unspecified atrial fibrillation: Secondary | ICD-10-CM | POA: Diagnosis not present

## 2020-08-04 LAB — CBC
HCT: 44.1 % (ref 39.0–52.0)
Hemoglobin: 14.5 g/dL (ref 13.0–17.0)
MCH: 32 pg (ref 26.0–34.0)
MCHC: 32.9 g/dL (ref 30.0–36.0)
MCV: 97.4 fL (ref 80.0–100.0)
Platelets: 235 10*3/uL (ref 150–400)
RBC: 4.53 MIL/uL (ref 4.22–5.81)
RDW: 13.3 % (ref 11.5–15.5)
WBC: 8.3 10*3/uL (ref 4.0–10.5)
nRBC: 0 % (ref 0.0–0.2)

## 2020-08-04 LAB — BASIC METABOLIC PANEL
Anion gap: 11 (ref 5–15)
BUN: 15 mg/dL (ref 8–23)
CO2: 31 mmol/L (ref 22–32)
Calcium: 9.3 mg/dL (ref 8.9–10.3)
Chloride: 87 mmol/L — ABNORMAL LOW (ref 98–111)
Creatinine, Ser: 0.7 mg/dL (ref 0.61–1.24)
GFR, Estimated: >60 mL/min (ref >60)
Glucose, Bld: 120 mg/dL — ABNORMAL HIGH (ref 70–99)
Potassium: 3.6 mmol/L (ref 3.5–5.1)
Sodium: 129 mmol/L — ABNORMAL LOW (ref 135–145)

## 2020-08-04 LAB — MAGNESIUM: Magnesium: 2 mg/dL (ref 1.7–2.4)

## 2020-08-04 MED ORDER — AMIODARONE HCL 200 MG PO TABS
200.0000 mg | ORAL_TABLET | Freq: Two times a day (BID) | ORAL | Status: DC
  Administered 2020-08-04 – 2020-08-09 (×11): 200 mg via ORAL
  Filled 2020-08-04 (×11): qty 1

## 2020-08-04 MED ORDER — AMIODARONE HCL 200 MG PO TABS: 200.0000 mg | ORAL_TABLET | Freq: Every day | ORAL | Status: DC

## 2020-08-04 NOTE — TOC Progression Note (Signed)
Transition of Care [facility]) - Progression Note    Patient Details  Name: Samuel Barnes MRN: [id number] Date of Birth: 29-Mar-1950  Transition of Care [facility]) CM/SW Contact  Erin Sons, [place] Phone Number: 08/04/2020, 12:02 PM  Clinical Narrative:     CSW arranged HH PT/OT/RN(for CHF) with [facility]. CSW notified pt and daughter. They are agreeable to start of care Tuesday or Wednesday. TOC will follow for O2 needs.   Expected Discharge Plan: Home w Home Health Services Barriers to Discharge: Continued Medical Work up  Expected Discharge Plan and Services Expected Discharge Plan: Home w Home Health Services       Living arrangements for the past 2 months: Single Family Home                                       Social Determinants of Health (SDOH) Interventions    Readmission Risk Interventions No flowsheet data found.

## 2020-08-04 NOTE — Progress Notes (Signed)
SATURATION QUALIFICATIONS: (This note is used to comply with regulatory documentation for home oxygen)  Patient Saturations on Room Air at Rest = 95%  Patient Saturations on Room Air while Ambulating = 86%  Patient Saturations on 3 Liters of oxygen while Ambulating = 92%

## 2020-08-04 NOTE — Progress Notes (Signed)
[id number] Patient ID: Samuel Barnes, male   DOB: 1950-11-20, 70 y.o.   MRN: [id number]  PROGRESS NOTE    Samuel Barnes  FAO:[id number] DOB: [birth year] DOA: 07/28/2020 PCP: Pcp, No   Brief Narrative:  70 y.o. male with a history of aortic atherosclerosis, BPH, obesity, coronary artery calcification, depression, hyperlipidemia, hypertension, alcohol and tobacco use presented with worsening shortness of breath.  He was found to have acute diastolic heart failure, paroxysmal new onset atrial fibrillation and pleural effusion requiring oxygen supplementation.  He was started on IV Lasix and heparin.  Cardiology was consulted.  He also underwent right-sided thoracentesis.  Assessment & Plan:   Acute hypoxic respiratory failure -Possibly from heart failure exacerbation and pleural effusion -Required up to 8 L oxygen during this hospitalization.   -Currently on 2 L oxygen via nasal cannula.  Wean off as able.   Acute probable diastolic heart failure -Echo showed normal EF however LV diastolic dysfunction could not be evaluated -Cardiology following.  Continue strict input and output.  Daily weights.  Continue IV Lasix.  Fluid restriction.  Continue metoprolol and losartan.  Negative balance of 7301.1 cc since admission  Paroxysmal A. fib, new onset -Currently rate controlled.  He has been switched to Eliquis by cardiology. -Status post TEE/DCCV on 08/02/2020.  Continue metoprolol.  Follow further cardiology recommendations  Hyponatremia -Possibly from fluid overload.  Sodium 124 on admission. Still 129 today.  Monitor  Hypertension -Blood pressure stable.  Continue metoprolol, Lasix and losartan.  Amlodipine on hold.  Coronary artery calcification Hyperlipidemia -Continue statin.  On lovastatin as an outpatient  BPH -Continue terazosin  Alcohol use -No signs of withdrawal.  Continue thiamine, multivitamin and folic acid  Tobacco use -Smoking cessation discussed by prior  hospitalist.  Patient declined nicotine replacement therapy. -Outpatient follow-up.  Continue DuoNeb as needed  Fatty liver -Seen on imaging.  Likely related to alcohol consumption.  Outpatient follow-up  Generalized deconditioning -PT recommends home health PT  Depression -Continue Zoloft  Hyperglycemia/prediabetes -A1c 5.7.  Outpatient follow-up.  DVT prophylaxis: Eliquis Code Status: Full Family Communication: Daughter at bedside Disposition Plan: Status is: Inpatient  Remains inpatient appropriate because:Inpatient level of care appropriate due to severity of illness  Dispo: The patient is from: Home              Anticipated d/c is to: Home in 1 day once cleared by cardiology and patient improves medically              Patient currently is not medically stable to d/c.   Difficult to place patient No  Consultants: Cardiology  Procedures: Echo TEE/DCCV on 08/02/2020  Antimicrobials: None   Subjective: Patient seen and examined at bedside.  Shortness of breath is improving but still short of breath with some exertion.  Denies any chest pain.  No overnight fever or vomiting reported. Objective: Vitals:   08/03/20 2022 08/03/20 2134 08/04/20 0448 08/04/20 0737  BP: 133/83  120/79   Pulse: 89 89 72   Resp:      Temp: 98.6 F (37 C)  99.1 F (37.3 C)   TempSrc: Oral  Oral   SpO2: 95% 95% 93% 94%  Weight:    92.4 kg  Height:        Intake/Output Summary (Last 24 hours) at 08/04/2020 0741 Last data filed at 08/04/2020 0447 Gross per 24 hour  Intake --  Output 1900 ml  Net -1900 ml    Filed Weights   08/03/20 0436 08/03/20  0740 08/04/20 0737  Weight: 97.4 kg 93.9 kg 92.4 kg    Examination:  General exam: On 2 L oxygen via nasal cannula.  No acute distress.  Respiratory system: Bilateral decreased breath sounds at bases with some basilar crackles  cardiovascular system: S1-S2 heard, rate controlled  gastrointestinal system: Abdomen is distended mildly,  soft and nontender.  Normal bowel sounds heard Extremities: No cyanosis; trace lower extremity edema present Central nervous system: Alert and oriented.  No focal neurological deficits.  Moving extremities skin: No obvious petechiae/lesions Psychiatry: Judgment, affect and mood are normal    Data Reviewed: I have personally reviewed following labs and imaging studies  CBC: Recent Labs  Lab 07/28/20 1051 07/29/20 0453 07/31/20 0347 08/01/20 0042 08/02/20 0225 08/03/20 0208 08/04/20 0249  WBC 8.2   < > 7.8 9.2 8.0 9.3 8.3  NEUTROABS 6.7  --   --   --   --   --   --   HGB 15.8   < > 14.4 14.7 14.0 14.4 14.5  HCT 44.4   < > 42.8 42.7 41.6 42.8 44.1  MCV 93.1   < > 95.1 95.5 96.7 96.4 97.4  PLT 209   < > 160 186 178 213 235   < > = values in this interval not displayed.    Basic Metabolic Panel: Recent Labs  Lab 07/28/20 1051 07/29/20 0453 07/29/20 1137 07/30/20 0555 07/30/20 1418 07/31/20 1734 08/01/20 0042 08/02/20 0225 08/03/20 0208 08/04/20 0249  NA 124* 124*   < > 126*   < > 129* 129* 129* 129* 129*  K 4.3 4.9   < > 3.8   < > 3.9 3.7 3.7 4.3 3.6  CL 84* 87*   < > 87*   < > 89* 89* 91* 90* 87*  CO2 30 27   < > 28   < > 31 31 29  33* 31  GLUCOSE 130* 112*   < > 102*   < > 100* 103* 98 118* 120*  BUN 10 8   < > 9   < > 11 15 11 11 15   CREATININE 0.50* 0.51*   < > 0.49*   < > 0.60* 0.66 0.51* 0.54* 0.70  CALCIUM 8.7* 8.7*   < > 8.8*   < > 9.2 8.8* 8.6* 8.9 9.3  MG 1.6* 1.9  --  1.7  --   --   --   --   --  2.0   < > = values in this interval not displayed.    GFR: Estimated Creatinine Clearance: 93.4 mL/min (by C-G formula based on SCr of 0.7 mg/dL). Liver Function Tests: Recent Labs  Lab 07/28/20 1051 07/31/20 1734  AST 19  --   ALT 14  --   ALKPHOS 102  --   BILITOT 1.0  --   PROT 7.2 6.4*  ALBUMIN 4.1  --     No results for input(s): LIPASE, AMYLASE in the last 168 hours. No results for input(s): AMMONIA in the last 168 hours. Coagulation  Profile: No results for input(s): INR, PROTIME in the last 168 hours. Cardiac Enzymes: No results for input(s): CKTOTAL, CKMB, CKMBINDEX, TROPONINI in the last 168 hours. BNP (last 3 results) No results for input(s): PROBNP in the last 8760 hours. HbA1C: No results for input(s): HGBA1C in the last 72 hours. CBG: No results for input(s): GLUCAP in the last 168 hours. Lipid Profile: No results for input(s): CHOL, HDL, LDLCALC, TRIG, CHOLHDL, LDLDIRECT in the last  72 hours. Thyroid Function Tests: No results for input(s): TSH, T4TOTAL, FREET4, T3FREE, THYROIDAB in the last 72 hours. Anemia Panel: No results for input(s): VITAMINB12, FOLATE, FERRITIN, TIBC, IRON, RETICCTPCT in the last 72 hours. Sepsis Labs: Recent Labs  Lab 07/28/20 1051 07/28/20 1249  LATICACIDVEN 2.1* 0.8     Recent Results (from the past 240 hour(s))  Resp Panel by RT-PCR (Flu A&B, Covid) Nasopharyngeal Swab     Status: None   Collection Time: 07/28/20 10:51 AM   Specimen: Nasopharyngeal Swab; Nasopharyngeal(NP) swabs in vial transport medium  Result Value Ref Range Status   SARS Coronavirus 2 by RT PCR NEGATIVE NEGATIVE Final    Comment: (NOTE) SARS-CoV-2 target nucleic acids are NOT DETECTED.  The SARS-CoV-2 RNA is generally detectable in upper respiratory specimens during the acute phase of infection. The lowest concentration of SARS-CoV-2 viral copies this assay can detect is 138 copies/mL. A negative result does not preclude SARS-Cov-2 infection and should not be used as the sole basis for treatment or other patient management decisions. A negative result may occur with  improper specimen collection/handling, submission of specimen other than nasopharyngeal swab, presence of viral mutation(s) within the areas targeted by this assay, and inadequate number of viral copies(<138 copies/mL). A negative result must be combined with clinical observations, patient history, and epidemiological information.  The expected result is Negative.  Fact Sheet for Patients:  BloggerCourse.com  Fact Sheet for Healthcare Providers:  SeriousBroker.it  This test is no t yet approved or cleared by the [place] FDA and  has been authorized for detection and/or diagnosis of SARS-CoV-2 by FDA under an Emergency Use Authorization (EUA). This EUA will remain  in effect (meaning this test can be used) for the duration of the COVID-19 declaration under Section 564(b)(1) of the Act, 21 U.S.C.section 360bbb-3(b)(1), unless the authorization is terminated  or revoked sooner.       Influenza A by PCR NEGATIVE NEGATIVE Final   Influenza B by PCR NEGATIVE NEGATIVE Final    Comment: (NOTE) The Xpert Xpress SARS-CoV-2/FLU/RSV plus assay is intended as an aid in the diagnosis of influenza from Nasopharyngeal swab specimens and should not be used as a sole basis for treatment. Nasal washings and aspirates are unacceptable for Xpert Xpress SARS-CoV-2/FLU/RSV testing.  Fact Sheet for Patients: BloggerCourse.com  Fact Sheet for Healthcare Providers: SeriousBroker.it  This test is not yet approved or cleared by the [place] FDA and has been authorized for detection and/or diagnosis of SARS-CoV-2 by FDA under an Emergency Use Authorization (EUA). This EUA will remain in effect (meaning this test can be used) for the duration of the COVID-19 declaration under Section 564(b)(1) of the Act, 21 U.S.C. section 360bbb-3(b)(1), unless the authorization is terminated or revoked.  Performed at [facility], [address] [place], [place] [address]   Blood culture (routine x 2)     Status: None   Collection Time: 07/28/20  2:43 PM   Specimen: Left Antecubital; Blood  Result Value Ref Range Status   Specimen Description   Final    LEFT ANTECUBITAL BLOOD Performed at [facility], [address] [place], [place] [address]    Special Requests   Final    BOTTLES DRAWN AEROBIC AND ANAEROBIC Blood Culture adequate volume Performed at [facility], [address] [place], [place] [address]    Culture   Final    NO GROWTH 5 DAYS Performed at [facility], [address], [place], [place] [address]    Report Status 08/02/2020 FINAL  Final  Blood culture (routine x 2)     Status: None   Collection Time: 07/28/20  2:53 PM   Specimen: BLOOD RIGHT HAND  Result Value Ref Range Status   Specimen Description   Final    BLOOD RIGHT HAND BLOOD Performed at [facility], [address] [place], [place] [address]    Special Requests   Final    BOTTLES DRAWN AEROBIC AND ANAEROBIC Blood Culture adequate volume Performed at [facility], [address] [place], [place] [address]    Culture   Final    NO GROWTH 5 DAYS Performed at [facility], [address], [place], [place] [address]    Report Status 08/02/2020 FINAL  Final  C Difficile Quick Screen w PCR reflex     Status: None   Collection Time: 07/28/20 11:55 PM   Specimen: STOOL  Result Value Ref Range Status   C Diff antigen NEGATIVE NEGATIVE Final   C Diff toxin NEGATIVE NEGATIVE Final   C Diff interpretation No C. difficile detected.  Final    Comment: Performed at [facility], [address], [place], [place] [address]  Body fluid culture w Gram Stain     Status: None   Collection Time: 07/31/20  4:16 PM   Specimen: Lung, Right; Pleural Fluid  Result Value Ref Range Status   Specimen Description PLEURAL FLUID  Final   Special Requests LUNG RIGHT  Final   Gram Stain   Final    FEW WBC PRESENT, PREDOMINANTLY MONONUCLEAR NO ORGANISMS SEEN    Culture   Final    NO GROWTH Performed at [facility], [address], [place], [place] [address]    Report Status 08/03/2020 FINAL  Final          Radiology Studies: DG Chest 2 View  Result Date:  08/03/2020 CLINICAL DATA:  Dyspnea. EXAM: CHEST - 2 VIEW COMPARISON:  August 01, 2020. FINDINGS: Mild cardiomegaly is noted. No pneumothorax is noted. Old right rib fractures are noted. Left lung is clear. Mild right pleural effusion is noted with associated right basilar atelectasis or infiltrate. IMPRESSION: Mild right pleural effusion is noted with associated right basilar atelectasis or infiltrate. Electronically Signed   By: Lupita Raider M.D.   On: 08/03/2020 13:18   ECHO TEE  Result Date: 08/02/2020    TRANSESOPHOGEAL ECHO REPORT   Patient Name:   Samuel Barnes Date of Exam: 08/02/2020 Medical Rec #:  [id number]             Height:       68.0 in Accession #:    [id number]            Weight:       215.2 lb Date of Birth:  [birth year]             BSA:          2.108 m Patient Age:    70 years              BP:           144/77 mmHg Patient Gender: M                     HR:           48 bpm. Exam Location:  Inpatient  Procedure: Transesophageal Echo, 3D Echo, Cardiac Doppler and Color Doppler Indications:     I48.1 Persistent atrial fibrillation  History:         Patient has prior history of Echocardiogram examinations, most                  recent 07/30/2020. Risk Factors:Hypertension, Dyslipidemia and                  Current Smoker. Acute hypoxic respiratory failure likely 2/2                  acute CHF. ETOH Abuse.  Sonographer:     Leta Junglingiffany Cooper RDCS Referring Phys:  [id number] Beatriz StallionKRISTA M. KROEGER Diagnosing Phys: Laurance FlattenHeather Pemberton MD PROCEDURE: After discussion of the risks and benefits of a TEE, an informed consent was obtained from the patient. The transesophogeal probe was passed without difficulty through the esophogus of the patient. Imaged were obtained with the patient in a left lateral decubitus position. Sedation performed by different physician. The patient was monitored while under deep sedation. Anesthestetic sedation was provided intravenously by Anesthesiology: 225.92mg  of Propofol, 30mg  of  Lidocaine. Image quality was good. The patient's vital signs; including heart rate, blood pressure, and oxygen saturation; remained stable throughout the procedure. The patient developed no complications during the procedure. IMPRESSIONS  1. Left ventricular ejection fraction, by estimation, is 55 to 60%. The left ventricle has normal function.  2. Right ventricular systolic function is normal. The right ventricular size is normal.  3. Left atrial size was mild to moderately dilated. No left atrial/left atrial appendage thrombus was detected.  4. Right atrial size was moderately dilated.  5. The mitral valve is normal in structure. Mild mitral valve regurgitation. No evidence of mitral stenosis.  6. The aortic valve is tricuspid. There is mild calcification of the aortic valve. There is mild thickening of the aortic valve. Aortic valve regurgitation is not visualized. Mild aortic valve sclerosis is present, with no evidence of aortic valve stenosis.  7. There is Moderate (Grade III) plaque.  8. Successful DCCV performed (200J x1) after TEE with return to NSR. Please see separate note. FINDINGS  Left Ventricle: Left ventricular ejection fraction, by estimation, is 55 to 60%. The left ventricle has normal function. The left ventricular internal cavity size was normal in size. Right Ventricle: The right ventricular size is normal. No increase in right ventricular wall thickness. Right ventricular systolic function is normal. Left Atrium: Left atrial size was mild to moderately dilated. No left atrial/left atrial appendage thrombus was detected. Right Atrium: Right atrial size was moderately dilated. Pericardium: There is no evidence of pericardial effusion. Mitral Valve: The mitral valve is normal in structure. There is mild thickening of the mitral valve leaflet(s). There is mild calcification of the mitral valve leaflet(s). Mild mitral valve regurgitation. No evidence of mitral valve stenosis. Tricuspid Valve: The  tricuspid valve is normal in structure. Tricuspid valve regurgitation is mild. Aortic Valve: The aortic valve is tricuspid. There is mild calcification of the aortic valve. There is mild thickening of the aortic valve. Aortic valve regurgitation is not visualized. Mild aortic valve sclerosis is present, with no evidence of aortic valve stenosis. Pulmonic Valve: The pulmonic valve was normal in structure. Pulmonic valve regurgitation is trivial. Aorta: The aortic root and ascending aorta are structurally normal, with no evidence of dilitation. There is moderate (Grade III) plaque. IAS/Shunts: No atrial level shunt detected by color flow Doppler.  LEFT VENTRICLE PLAX 2D  LVOT diam:     2.00 cm LVOT Area:     3.14 cm   AORTA Ao Root diam: 3.20 cm Ao Asc diam:  3.50 cm TRICUSPID VALVE TR Peak grad:   16.8 mmHg TR Vmax:        205.00 cm/s  SHUNTS Systemic Diam: 2.00 cm Laurance Flatten MD Electronically signed by Laurance Flatten MD Signature Date/Time: 08/02/2020/4:16:15 PM    Final         Scheduled Meds:  apixaban  5 mg Oral BID   FLUoxetine  20 mg Oral QHS   folic acid  1 mg Oral Daily   furosemide  80 mg Intravenous TID   losartan  50 mg Oral Daily   metoprolol succinate  100 mg Oral Daily   mometasone-formoterol  2 puff Inhalation BID   montelukast  10 mg Oral Daily   multivitamin with minerals  1 tablet Oral Daily   potassium chloride  20 mEq Oral BID   pravastatin  20 mg Oral q1800   senna-docusate  1 tablet Oral BID   terazosin  5 mg Oral Daily   thiamine  100 mg Oral Daily   Continuous Infusions:          Glade Lloyd, MD [facility] 08/04/2020, 7:41 AM

## 2020-08-04 NOTE — Progress Notes (Addendum)
Progress Note  Patient Name: Samuel Barnes Date of Encounter: 08/04/2020  [facility] Cardiologist: Armanda Magic, MD   Subjective   Feels okay this morning. Back in atrial fibrillation, though remains asymptomatic. Breathing continues to improve - he ambulated in the hallway yesterday wihtout  Inpatient Medications    Scheduled Meds:  apixaban  5 mg Oral BID   FLUoxetine  20 mg Oral QHS   folic acid  1 mg Oral Daily   furosemide  80 mg Intravenous TID   losartan  50 mg Oral Daily   metoprolol succinate  100 mg Oral Daily   mometasone-formoterol  2 puff Inhalation BID   montelukast  10 mg Oral Daily   multivitamin with minerals  1 tablet Oral Daily   potassium chloride  20 mEq Oral BID   pravastatin  20 mg Oral q1800   senna-docusate  1 tablet Oral BID   terazosin  5 mg Oral Daily   thiamine  100 mg Oral Daily   Continuous Infusions:  PRN Meds: acetaminophen **OR** acetaminophen, bisacodyl, ipratropium-albuterol, lidocaine, polyethylene glycol, prochlorperazine   Vital Signs    Vitals:   08/04/20 0448 08/04/20 0737 08/04/20 0757 08/04/20 0803  BP: 120/79     Pulse: 72     Resp:      Temp: 99.1 F (37.3 C)     TempSrc: Oral     SpO2: 93% 94% 90% 90%  Weight:  92.4 kg    Height:        Intake/Output Summary (Last 24 hours) at 08/04/2020 0815 Last data filed at 08/04/2020 0447 Gross per 24 hour  Intake --  Output 1900 ml  Net -1900 ml   Last 3 Weights 08/04/2020 08/03/2020 08/03/2020  Weight (lbs) 203 lb 11.2 oz 207 lb 0.2 oz 214 lb 11.7 oz  Weight (kg) 92.398 kg 93.9 kg 97.4 kg      Telemetry    Back in atrial fibrillation since around midnight. Rates occasionally up to 120s - Personally Reviewed  ECG    No new tracings - Personally Reviewed  Physical Exam   GEN: No acute distress.   Neck: No JVD Cardiac: IRIR, no murmurs, rubs, or gallops.  Respiratory: Clear to auscultation bilaterally. GI: Soft, nontender, non-distended  MS: trace  LE edema; No deformity. Neuro:  Nonfocal  Psych: Normal affect   Labs    High Sensitivity Troponin:   Recent Labs  Lab 07/28/20 1051 07/28/20 1249  TROPONINIHS 2 2      Chemistry Recent Labs  Lab 07/28/20 1051 07/29/20 0453 07/31/20 1734 08/01/20 0042 08/02/20 0225 08/03/20 0208 08/04/20 0249  NA 124*   < > 129*   < > 129* 129* 129*  K 4.3   < > 3.9   < > 3.7 4.3 3.6  CL 84*   < > 89*   < > 91* 90* 87*  CO2 30   < > 31   < > 29 33* 31  GLUCOSE 130*   < > 100*   < > 98 118* 120*  BUN 10   < > 11   < > 11 11 15   CREATININE 0.50*   < > 0.60*   < > 0.51* 0.54* 0.70  CALCIUM 8.7*   < > 9.2   < > 8.6* 8.9 9.3  PROT 7.2  --  6.4*  --   --   --   --   ALBUMIN 4.1  --   --   --   --   --   --  AST 19  --   --   --   --   --   --   ALT 14  --   --   --   --   --   --   ALKPHOS 102  --   --   --   --   --   --   BILITOT 1.0  --   --   --   --   --   --   GFRNONAA >60   < > >60   < > >60 >60 >60  ANIONGAP 10   < > 9   < > < > = values in this interval not displayed.     Hematology Recent Labs  Lab 08/02/20 0225 08/03/20 0208 08/04/20 0249  WBC 8.0 9.3 8.3  RBC 4.30 4.44 4.53  HGB 14.0 14.4 14.5  HCT 41.6 42.8 44.1  MCV 96.7 96.4 97.4  MCH 32.6 32.4 32.0  MCHC 33.7 33.6 32.9  RDW 13.1 13.1 13.3  PLT 178 213 235    BNP Recent Labs  Lab 07/28/20 1051 07/31/20 1037  BNP 302.6* 245.9*     DDimer  Recent Labs  Lab 07/28/20 1051  DDIMER 1.21*     Radiology    DG Chest 2 View  Result Date: 08/03/2020 CLINICAL DATA:  Dyspnea. EXAM: CHEST - 2 VIEW COMPARISON:  August 01, 2020. FINDINGS: Mild cardiomegaly is noted. No pneumothorax is noted. Old right rib fractures are noted. Left lung is clear. Mild right pleural effusion is noted with associated right basilar atelectasis or infiltrate. IMPRESSION: Mild right pleural effusion is noted with associated right basilar atelectasis or infiltrate. Electronically Signed   By: Lupita Raider M.D.   On:  08/03/2020 13:18   ECHO TEE  Result Date: 08/02/2020    TRANSESOPHOGEAL ECHO REPORT   Patient Name:   Samuel Barnes Date of Exam: 08/02/2020 Medical Rec #:  [id number]             Height:       68.0 in Accession #:    [id number]            Weight:       215.2 lb Date of Birth:  05/29/1950             BSA:          2.108 m Patient Age:    70 years              BP:           144/77 mmHg Patient Gender: M                     HR:           48 bpm. Exam Location:  Inpatient Procedure: Transesophageal Echo, 3D Echo, Cardiac Doppler and Color Doppler Indications:     I48.1 Persistent atrial fibrillation  History:         Patient has prior history of Echocardiogram examinations, most                  recent 07/30/2020. Risk Factors:Hypertension, Dyslipidemia and                  Current Smoker. Acute hypoxic respiratory failure likely 2/2                  acute CHF. ETOH Abuse.  Sonographer:  Leta Jungling RDCS Referring Phys:  [id number] Beatriz Stallion Diagnosing Phys: Laurance Flatten MD PROCEDURE: After discussion of the risks and benefits of a TEE, an informed consent was obtained from the patient. The transesophogeal probe was passed without difficulty through the esophogus of the patient. Imaged were obtained with the patient in a left lateral decubitus position. Sedation performed by different physician. The patient was monitored while under deep sedation. Anesthestetic sedation was provided intravenously by Anesthesiology: 225.92mg  of Propofol, 30mg  of Lidocaine. Image quality was good. The patient's vital signs; including heart rate, blood pressure, and oxygen saturation; remained stable throughout the procedure. The patient developed no complications during the procedure. IMPRESSIONS  1. Left ventricular ejection fraction, by estimation, is 55 to 60%. The left ventricle has normal function.  2. Right ventricular systolic function is normal. The right ventricular size is normal.  3. Left atrial size  was mild to moderately dilated. No left atrial/left atrial appendage thrombus was detected.  4. Right atrial size was moderately dilated.  5. The mitral valve is normal in structure. Mild mitral valve regurgitation. No evidence of mitral stenosis.  6. The aortic valve is tricuspid. There is mild calcification of the aortic valve. There is mild thickening of the aortic valve. Aortic valve regurgitation is not visualized. Mild aortic valve sclerosis is present, with no evidence of aortic valve stenosis.  7. There is Moderate (Grade III) plaque.  8. Successful DCCV performed (200J x1) after TEE with return to NSR. Please see separate note. FINDINGS  Left Ventricle: Left ventricular ejection fraction, by estimation, is 55 to 60%. The left ventricle has normal function. The left ventricular internal cavity size was normal in size. Right Ventricle: The right ventricular size is normal. No increase in right ventricular wall thickness. Right ventricular systolic function is normal. Left Atrium: Left atrial size was mild to moderately dilated. No left atrial/left atrial appendage thrombus was detected. Right Atrium: Right atrial size was moderately dilated. Pericardium: There is no evidence of pericardial effusion. Mitral Valve: The mitral valve is normal in structure. There is mild thickening of the mitral valve leaflet(s). There is mild calcification of the mitral valve leaflet(s). Mild mitral valve regurgitation. No evidence of mitral valve stenosis. Tricuspid Valve: The tricuspid valve is normal in structure. Tricuspid valve regurgitation is mild. Aortic Valve: The aortic valve is tricuspid. There is mild calcification of the aortic valve. There is mild thickening of the aortic valve. Aortic valve regurgitation is not visualized. Mild aortic valve sclerosis is present, with no evidence of aortic valve stenosis. Pulmonic Valve: The pulmonic valve was normal in structure. Pulmonic valve regurgitation is trivial. Aorta: The  aortic root and ascending aorta are structurally normal, with no evidence of dilitation. There is moderate (Grade III) plaque. IAS/Shunts: No atrial level shunt detected by color flow Doppler.  LEFT VENTRICLE PLAX 2D LVOT diam:     2.00 cm LVOT Area:     3.14 cm   AORTA Ao Root diam: 3.20 cm Ao Asc diam:  3.50 cm TRICUSPID VALVE TR Peak grad:   16.8 mmHg TR Vmax:        205.00 cm/s  SHUNTS Systemic Diam: 2.00 cm MD Electronically signed by Laurance Flatten MD Signature Date/Time: 08/02/2020/4:16:15 PM    Final     Cardiac Studies   Echocardiogram 07/29/20: IMPRESSIONS    1. Left ventricular ejection fraction, by estimation, is 55 to 60%. The  left ventricle has normal function. The left ventricle has no regional  wall motion abnormalities. Left ventricular diastolic function could not  be evaluated.   2. Prominent moderator band of no clinical significance. Right  ventricular systolic function is mildly reduced. The right ventricular  size is normal. There is mildly elevated pulmonary artery systolic  pressure. The estimated right ventricular systolic  pressure is 36.5 mmHg.   3. The mitral valve is normal in structure. No evidence of mitral valve  regurgitation. No evidence of mitral stenosis.   4. The aortic valve is normal in structure. Aortic valve regurgitation is  not visualized. No aortic stenosis is present.   5. The inferior vena cava is dilated in size with <50% respiratory  variability, suggesting right atrial pressure of 15 mmHg.  NST 07/30/20: 1. 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 No definite evidence of reversibility is noted to suggest acute ischemia.   2. Normal left ventricular wall motion.   3. Left ventricular ejection fraction 65%   4. Non invasive risk stratification*: Low  TEE/DCCV 08/02/20: 1. Left ventricular ejection fraction, by estimation, is 55 to 60%. The  left ventricle  has normal function.   2. Right ventricular systolic function is normal. The right ventricular  size is normal.   3. Left atrial size was mild to moderately dilated. No left atrial/left  atrial appendage thrombus was detected.   4. Right atrial size was moderately dilated.   5. The mitral valve is normal in structure. Mild mitral valve  regurgitation. No evidence of mitral stenosis.   6. The aortic valve is tricuspid. There is mild calcification of the  aortic valve. There is mild thickening of the aortic valve. Aortic valve  regurgitation is not visualized. Mild aortic valve sclerosis is present,  with no evidence of aortic valve  stenosis.   7. There is Moderate (Grade III) plaque.   8. Successful DCCV performed (200J x1) after TEE with return to NSR.  Please see separate note.    Patient Profile     70 y.o. male  with a PMH of coronary artery calcifications, HTN, HLD, BPH s/p TURP, depression, and tobacco abuse,  who is being followed by cardiology for the evaluation of new onset atrial fibrillation  Assessment & Plan    1. Acute hypoxic respiratory failure likely 2/2 acute diastolic CHF: patient presented with several days of worsening SOB, cough, fatigue. He was hypoxic to 87% on arrival which improved with O2 via [place]. EKG with non-specific ST-T wave abnormalities. BNP 302. HsTrop negative x2. Found to have mild-moderate pleural effusions and mild pericardial effusion on CT but negative for PE. Echocardiogram showed EF 55-60%, no RWMA, mildly elevated PA pressures, and no significant valvular abnormalities. High suspicion for underlying COPD +/- OSA which may be contributing to his presentation. He underwent US guided right thoracentesis 07/31/20 with 1.5L removed. NST was negative for ischemia. Suspect Afib was driving his volume overload. He underwent successful TEE/DCCV 08/02/20 with restoration of NSR. He continues to diurese with IV lasix, increased 08/03/20 given ongoing O2 demands and  mild right pleural effusion on CXR with UOP -1.9L in the past 24 hours (intake not documented) and -7.3L this admission. Weight continues to decrease from 231lbs on admission down to 203lbs today. Still on 2L O2 via [place] with O2 sats at 93% at the time of my evaluation - Given ongoing O2 needs, will continue IV lasix today - hopeful to wean O2 and transition to po lasix in the next 24 hours. He  notes improvement in breathing with nebulizers - Continue losartan and metoprolol succinate - Continue supportive care per primary team - Would benefit from referral to pulmonology at discharge for COPD work-up/management   2. New onset atrial fibrillation: no prior history. EKG with rate controlled atrial fibrillation on admission. TSH is wnl. He was started on metoprolol tartrate 50mg  BID (on succinate 50mg  daily at home) for rate control and a heparin gtt for stroke ppx. Echo with EF 55-60%, mild-moderate LAE. He underwent TEE/DCCV 08/02/20 with successful restoration of NSR. Unfortunately back in Afib as of midnight. - CHA2DS2-VASc Score = 4 [CHF History: Yes, HTN History: Yes, Diabetes History: No, Stroke History: No, Vascular Disease History: Yes, Age Score: 1, Gender Score: 0].  Therefore, the patient's annual risk of stroke is 4.8 %.    - Continue eliquis 5mg  BID for stroke ppx - Continue metoprolol succinate for rate control  - Will repeat EKG today to monitor Qtc - was prolonged on initial EKG but no subsequent EKGs obtained - Will start amiodarone 200mg  BID x14 days, then 200mg  daily for rhythm control - Overall asymptomatic, though could consider repeat DCCV outpatient following amiodarone load if he remains in atrial fibrillation. - Would benefit from an outpatient sleep study to r/o OSA   3. Coronary artery calcifications: noted on prior CT scans. Has not had any ischemic testing. Risk factors for obstructive CAD include HTN, HLD, and tobacco abuse. Echo with EF 55-60% and no RWMA. NST negative this  admission. Aspirin not initiated due to need for anticoagulation - Continue statin and Bblocker  - Continue aggressive risk factor modifications - goal BP <130/80, LDL <70, A1C <7, smoking cessation   4. HTN: BP generally elevated. Home amlodipine stopped and he was started on metoprolol tartrate 50mg  BID (succinate 50mg  daily at home) and Lisinopril which was changed to Losartan - BP controlled on exam - Continue metoprolol succinate 100mg  daily and Losartan 50mg  daily   5. HLD: LDL 33 this admission on lovastatin at home and pravastatin this admission (hospital formulary) - Continue lovastatin at discharge   6. Hyponatremia: Na 124 on admission, stable at 129 the past several days. Likely hypervolemic - Continue to monitor closely with diuresis as above    7. Tobacco abuse: smokes 1ppd which he has for the past 50 years. Cessation advised. Suspicions high for underlying COPD - Continue to encourage cessation   8. ETOH abuse: drinks 3-4 cocktails 3-4 days per week. Discussed relationship between ETOH abuse and Afib. - Continue to encourage cessation     For questions or updates, please contact [facility] Please consult [email or web address] for contact info under        Signed, Beatriz StallionKrista M. Kroeger, PA-C  08/04/2020, 8:15 AM    Personally seen and examined. Agree with APP above with the following comments: Briefly 70 yo M with prior tobacco abuse, COPD, ETOH Abuse new AF, CHASVASC of 4 s/p DCCV with return to AF; HTN and HLD Patient notes that he could feel there return to AF.  Didn't feel palpitations but wasn't surprised when nursing told him his heart rates were up. Exam notable for irregularly irregular rhythm, trace edema; good air movement; O2 sat 89% on O2. Labs notable for stable creatinine, K 3.6 and BNP has been improving Personally reviewed relevant tests; telemetry shows return to AF Would recommend  - continue IV lasix with possible transition to PO lasix 08/05/20 ( we may  be able to back of K supplmentation with transition) -  concern about starting 1C with clinica phenotype of HFpEF and with CAC on CT, starting amiodarone PO as above for short-medium term control - continue eliquis - continue metoprolol 100 mg PO Daily - losartan 50 mg PO Daily - on pravastatin LDL 33 - discussed outpatient sleep study; discussed alcohol and smoking cessation at length with patient and wife  Riley Lam, MD Cardiologist [facility]  [address] [place], [address] [place], [place] [address] [phone]  11:07 AM

## 2020-08-05 DIAGNOSIS — J9601 Acute respiratory failure with hypoxia: Secondary | ICD-10-CM | POA: Diagnosis not present

## 2020-08-05 DIAGNOSIS — I5031 Acute diastolic (congestive) heart failure: Secondary | ICD-10-CM | POA: Diagnosis not present

## 2020-08-05 DIAGNOSIS — I4891 Unspecified atrial fibrillation: Secondary | ICD-10-CM | POA: Diagnosis not present

## 2020-08-05 LAB — BASIC METABOLIC PANEL
Anion gap: 9 (ref 5–15)
BUN: 18 mg/dL (ref 8–23)
CO2: 32 mmol/L (ref 22–32)
Calcium: 9.2 mg/dL (ref 8.9–10.3)
Chloride: 88 mmol/L — ABNORMAL LOW (ref 98–111)
Creatinine, Ser: 0.8 mg/dL (ref 0.61–1.24)
GFR, Estimated: >60 mL/min (ref >60)
Glucose, Bld: 148 mg/dL — ABNORMAL HIGH (ref 70–99)
Potassium: 3.8 mmol/L (ref 3.5–5.1)
Sodium: 129 mmol/L — ABNORMAL LOW (ref 135–145)

## 2020-08-05 LAB — MAGNESIUM: Magnesium: 1.8 mg/dL (ref 1.7–2.4)

## 2020-08-05 NOTE — Plan of Care (Signed)
  Problem: Activity: Goal: Capacity to carry out activities will improve Outcome: Progressing   Problem: Education: Goal: Knowledge of disease or condition will improve Outcome: Progressing   Problem: Activity: Goal: Ability to tolerate increased activity will improve Outcome: Progressing

## 2020-08-05 NOTE — Progress Notes (Signed)
[id number] Patient ID: SOTA HETZ, male   DOB: 07-31-1950, 70 y.o.   MRN: [id number]  PROGRESS NOTE    ALEZANDER DIMAANO  ZMO:[id number] DOB: [birth year] DOA: 07/28/2020 PCP: Pcp, No   Brief Narrative:  70 y.o. male with a history of aortic atherosclerosis, BPH, obesity, coronary artery calcification, depression, hyperlipidemia, hypertension, alcohol and tobacco use presented with worsening shortness of breath.  He was found to have acute diastolic heart failure, paroxysmal new onset atrial fibrillation and pleural effusion requiring oxygen supplementation.  He was started on IV Lasix and heparin.  Cardiology was consulted.  He also underwent right-sided thoracentesis.  He underwent TEE/DCCV by cardiology on 08/02/2020.  Assessment & Plan:   Acute hypoxic respiratory failure -Possibly from heart failure exacerbation and pleural effusion -Required up to 8 L oxygen during this hospitalization.   -Still on 3 L oxygen via nasal cannula.  Wean off as able.   Acute probable diastolic heart failure -Echo showed normal EF however LV diastolic dysfunction could not be evaluated -Cardiology following.  Continue strict input and output.  Daily weights.  Continue IV Lasix.  Fluid restriction.  Continue metoprolol and losartan.  Negative balance of 8401.1 cc since admission  Paroxysmal A. fib, new onset -Currently rate controlled.  He has been switched to Eliquis by cardiology. -Status post TEE/DCCV on 08/02/2020.  Continue metoprolol.  Follow further cardiology recommendations  Hyponatremia -Possibly from fluid overload.  Sodium 124 on admission. Still 129 today.  Monitor  Hypertension -Blood pressure stable.  Continue metoprolol, Lasix and losartan.  Amlodipine on hold.  Coronary artery calcification Hyperlipidemia -Continue statin.  On lovastatin as an outpatient  BPH -Continue terazosin  Alcohol use -No signs of withdrawal.  Continue thiamine, multivitamin and folic acid  Tobacco  use -Smoking cessation discussed by prior hospitalist.  Patient declined nicotine replacement therapy. -Outpatient follow-up.  Continue DuoNeb as needed  Fatty liver -Seen on imaging.  Likely related to alcohol consumption.  Outpatient follow-up  Generalized deconditioning -PT recommends home health PT  Depression -Continue Zoloft  Hyperglycemia/prediabetes -A1c 5.7.  Outpatient follow-up.  DVT prophylaxis: Eliquis Code Status: Full Family Communication: Daughter at bedside Disposition Plan: Status is: Inpatient  Remains inpatient appropriate because:Inpatient level of care appropriate due to severity of illness  Dispo: The patient is from: Home              Anticipated d/c is to: Home in 1 day once cleared by cardiology and patient improves medically              Patient currently is not medically stable to d/c.   Difficult to place patient No  Consultants: Cardiology  Procedures: Echo TEE/DCCV on 08/02/2020  Antimicrobials: None   Subjective: Patient seen and examined at bedside.  No overnight chest pain, fever or vomiting reported.  Still short of breath with exertion.   Objective: Vitals:   08/04/20 1540 08/04/20 2035 08/04/20 2121 08/05/20 0530  BP: 111/66  114/77 106/75  Pulse: 74 74 82 91  Resp: 14   20  Temp: 99 F (37.2 C)  98.2 F (36.8 C) 98.5 F (36.9 C)  TempSrc: Oral  Oral Oral  SpO2: 94% 94% 93% 92%  Weight:    94 kg  Height:        Intake/Output Summary (Last 24 hours) at 08/05/2020 0729 Last data filed at 08/05/2020 0412 Gross per 24 hour  Intake --  Output 1100 ml  Net -1100 ml    [employer]  08/03/20 0740 08/04/20 0737 08/05/20 0530  Weight: 93.9 kg 92.4 kg 94 kg    Examination:  General exam: No distress.  Still on 3 L oxygen by nasal cannula.  Respiratory system: Decreased breath sounds at bases bilaterally with bibasilar crackles  cardiovascular system: Rate controlled, S1-S2 heard gastrointestinal system: Abdomen is  distended slightly, soft and nontender.  Bowel sounds are heard  extremities: Bilateral lower extremity edema present; no clubbing Central nervous system: Awake and alert.  No focal neurological deficits.  Moves extremities skin: No obvious ecchymosis/rashes Psychiatry: Mood, affect and judgment are normal   Data Reviewed: I have personally reviewed following labs and imaging studies  CBC: Recent Labs  Lab 07/31/20 0347 08/01/20 0042 08/02/20 0225 08/03/20 0208 08/04/20 0249  WBC 7.8 9.2 8.0 9.3 8.3  HGB 14.4 14.7 14.0 14.4 14.5  HCT 42.8 42.7 41.6 42.8 44.1  MCV 95.1 95.5 96.7 96.4 97.4  PLT 160 186 178 213 235    Basic Metabolic Panel: Recent Labs  Lab 07/30/20 0555 07/30/20 1418 08/01/20 0042 08/02/20 0225 08/03/20 0208 08/04/20 0249 08/05/20 0026  NA 126*   < > 129* 129* 129* 129* 129*  K 3.8   < > 3.7 3.7 4.3 3.6 3.8  CL 87*   < > 89* 91* 90* 87* 88*  CO2 28   < > 31 29 33* 31 32  GLUCOSE 102*   < > 103* 98 118* 120* 148*  BUN 9   < > 15 11 11 15 18   CREATININE 0.49*   < > 0.66 0.51* 0.54* 0.70 0.80  CALCIUM 8.8*   < > 8.8* 8.6* 8.9 9.3 9.2  MG 1.7  --   --   --   --  2.0 1.8   < > = values in this interval not displayed.    GFR: Estimated Creatinine Clearance: 94.2 mL/min (by C-G formula based on SCr of 0.8 mg/dL). Liver Function Tests: Recent Labs  Lab 07/31/20 1734  PROT 6.4*    No results for input(s): LIPASE, AMYLASE in the last 168 hours. No results for input(s): AMMONIA in the last 168 hours. Coagulation Profile: No results for input(s): INR, PROTIME in the last 168 hours. Cardiac Enzymes: No results for input(s): CKTOTAL, CKMB, CKMBINDEX, TROPONINI in the last 168 hours. BNP (last 3 results) No results for input(s): PROBNP in the last 8760 hours. HbA1C: No results for input(s): HGBA1C in the last 72 hours. CBG: No results for input(s): GLUCAP in the last 168 hours. Lipid Profile: No results for input(s): CHOL, HDL, LDLCALC, TRIG,  CHOLHDL, LDLDIRECT in the last 72 hours. Thyroid Function Tests: No results for input(s): TSH, T4TOTAL, FREET4, T3FREE, THYROIDAB in the last 72 hours. Anemia Panel: No results for input(s): VITAMINB12, FOLATE, FERRITIN, TIBC, IRON, RETICCTPCT in the last 72 hours. Sepsis Labs: No results for input(s): PROCALCITON, LATICACIDVEN in the last 168 hours.   Recent Results (from the past 240 hour(s))  Resp Panel by RT-PCR (Flu A&B, Covid) Nasopharyngeal Swab     Status: None   Collection Time: 07/28/20 10:51 AM   Specimen: Nasopharyngeal Swab; Nasopharyngeal(NP) swabs in vial transport medium  Result Value Ref Range Status   SARS Coronavirus 2 by RT PCR NEGATIVE NEGATIVE Final    Comment: (NOTE) SARS-CoV-2 target nucleic acids are NOT DETECTED.  The SARS-CoV-2 RNA is generally detectable in upper respiratory specimens during the acute phase of infection. The lowest concentration of SARS-CoV-2 viral copies this assay can detect is 138 copies/mL. A negative  result does not preclude SARS-Cov-2 infection and should not be used as the sole basis for treatment or other patient management decisions. A negative result may occur with  improper specimen collection/handling, submission of specimen other than nasopharyngeal swab, presence of viral mutation(s) within the areas targeted by this assay, and inadequate number of viral copies(<138 copies/mL). A negative result must be combined with clinical observations, patient history, and epidemiological information. The expected result is Negative.  Fact Sheet for Patients:  BloggerCourse.comhttps://www.fda.gov/media/152166/download  Fact Sheet for Healthcare Providers:  SeriousBroker.ithttps://www.fda.gov/media/152162/download  This test is no t yet approved or cleared by the [place] FDA and  has been authorized for detection and/or diagnosis of SARS-CoV-2 by FDA under an Emergency Use Authorization (EUA). This EUA will remain  in effect (meaning this test can be used)  for the duration of the COVID-19 declaration under Section 564(b)(1) of the Act, 21 U.S.C.section 360bbb-3(b)(1), unless the authorization is terminated  or revoked sooner.       Influenza A by PCR NEGATIVE NEGATIVE Final   Influenza B by PCR NEGATIVE NEGATIVE Final    Comment: (NOTE) The Xpert Xpress SARS-CoV-2/FLU/RSV plus assay is intended as an aid in the diagnosis of influenza from Nasopharyngeal swab specimens and should not be used as a sole basis for treatment. Nasal washings and aspirates are unacceptable for Xpert Xpress SARS-CoV-2/FLU/RSV testing.  Fact Sheet for Patients: BloggerCourse.comhttps://www.fda.gov/media/152166/download  Fact Sheet for Healthcare Providers: SeriousBroker.ithttps://www.fda.gov/media/152162/download  This test is not yet approved or cleared by the [place] FDA and has been authorized for detection and/or diagnosis of SARS-CoV-2 by FDA under an Emergency Use Authorization (EUA). This EUA will remain in effect (meaning this test can be used) for the duration of the COVID-19 declaration under Section 564(b)(1) of the Act, 21 U.S.C. section 360bbb-3(b)(1), unless the authorization is terminated or revoked.  Performed at [facility] [place], [address] [place], [place] [address]   Blood culture (routine x 2)     Status: None   Collection Time: 07/28/20  2:43 PM   Specimen: Left Antecubital; Blood  Result Value Ref Range Status   Specimen Description   Final    LEFT ANTECUBITAL BLOOD Performed at [facility] [place], [address] [place], [place] [address]    Special Requests   Final    BOTTLES DRAWN AEROBIC AND ANAEROBIC Blood Culture adequate volume Performed at [facility] [place], [address] [place], [place] [address]    Culture   Final    NO GROWTH 5 DAYS Performed at [facility], [address] [place], [place] [address]    Report Status 08/02/2020 FINAL  Final  Blood culture (routine x 2)     Status: None   Collection Time:  07/28/20  2:53 PM   Specimen: BLOOD RIGHT HAND  Result Value Ref Range Status   Specimen Description   Final    BLOOD RIGHT HAND BLOOD Performed at [facility] [place], [address] [place], [place] [address]    Special Requests   Final    BOTTLES DRAWN AEROBIC AND ANAEROBIC Blood Culture adequate volume Performed at [facility] [place], [address] [place], [place] [address]    Culture   Final    NO GROWTH 5 DAYS Performed at [facility], [address] [place], [place] [address]    Report Status 08/02/2020 FINAL  Final  C Difficile Quick Screen w PCR reflex  Status: None   Collection Time: 07/28/20 11:55 PM   Specimen: STOOL  Result Value Ref Range Status   C Diff antigen NEGATIVE NEGATIVE Final   C Diff toxin NEGATIVE NEGATIVE Final   C Diff interpretation No C. difficile detected.  Final    Comment: Performed at [facility], [address], [place], [place] [address]  Body fluid culture w Gram Stain     Status: None   Collection Time: 07/31/20  4:16 PM   Specimen: Lung, Right; Pleural Fluid  Result Value Ref Range Status   Specimen Description PLEURAL FLUID  Final   Special Requests LUNG RIGHT  Final   Gram Stain   Final    FEW WBC PRESENT, PREDOMINANTLY MONONUCLEAR NO ORGANISMS SEEN    Culture   Final    NO GROWTH Performed at [facility], [address] [place], [place] [address]    Report Status 08/03/2020 FINAL  Final          Radiology Studies: DG Chest 2 View  Result Date: 08/03/2020 CLINICAL DATA:  Dyspnea. EXAM: CHEST - 2 VIEW COMPARISON:  August 01, 2020. FINDINGS: Mild cardiomegaly is noted. No pneumothorax is noted. Old right rib fractures are noted. Left lung is clear. Mild right pleural effusion is noted with associated right basilar atelectasis or infiltrate. IMPRESSION: Mild right pleural effusion is noted with associated right basilar atelectasis or infiltrate. Electronically Signed   By: Lupita Raider M.D.    On: 08/03/2020 13:18        Scheduled Meds:  amiodarone  200 mg Oral BID   Followed by   Melene Muller ON 08/18/2020] amiodarone  200 mg Oral Daily   apixaban  5 mg Oral BID   FLUoxetine  20 mg Oral QHS   folic acid  1 mg Oral Daily   furosemide  80 mg Intravenous TID   losartan  50 mg Oral Daily   metoprolol succinate  100 mg Oral Daily   mometasone-formoterol  2 puff Inhalation BID   montelukast  10 mg Oral Daily   multivitamin with minerals  1 tablet Oral Daily   potassium chloride  20 mEq Oral BID   pravastatin  20 mg Oral q1800   senna-docusate  1 tablet Oral BID   terazosin  5 mg Oral Daily   thiamine  100 mg Oral Daily   Continuous Infusions:          Glade Lloyd, MD [facility] 08/05/2020, 7:29 AM

## 2020-08-05 NOTE — Progress Notes (Signed)
Progress Note  Patient Name: Samuel Barnes Date of Encounter: 08/05/2020  [facility] Cardiologist: Armanda Magic, MD   Subjective   Feels better with inhalers, feels better but not 100%.  Notes 50 + pack year hx  Inpatient Medications    Scheduled Meds:  amiodarone  200 mg Oral BID   Followed by   Melene Muller ON 08/18/2020] amiodarone  200 mg Oral Daily   apixaban  5 mg Oral BID   FLUoxetine  20 mg Oral QHS   folic acid  1 mg Oral Daily   furosemide  80 mg Intravenous TID   losartan  50 mg Oral Daily   metoprolol succinate  100 mg Oral Daily   mometasone-formoterol  2 puff Inhalation BID   montelukast  10 mg Oral Daily   multivitamin with minerals  1 tablet Oral Daily   potassium chloride  20 mEq Oral BID   pravastatin  20 mg Oral q1800   senna-docusate  1 tablet Oral BID   terazosin  5 mg Oral Daily   thiamine  100 mg Oral Daily   Continuous Infusions:  PRN Meds: acetaminophen **OR** acetaminophen, bisacodyl, ipratropium-albuterol, lidocaine, polyethylene glycol, prochlorperazine   Vital Signs    Vitals:   08/04/20 2035 08/04/20 2121 08/05/20 0530 08/05/20 0739  BP:  114/77 106/75   Pulse: 74 82 91 93  Resp:   20   Temp:  98.2 F (36.8 C) 98.5 F (36.9 C)   TempSrc:  Oral Oral   SpO2: 94% 93% 92% 92%  Weight:   94 kg   Height:        Intake/Output Summary (Last 24 hours) at 08/05/2020 0945 Last data filed at 08/05/2020 0412 Gross per 24 hour  Intake --  Output 1100 ml  Net -1100 ml   Last 3 Weights 08/05/2020 08/04/2020 08/03/2020  Weight (lbs) 207 lb 3.7 oz 203 lb 11.2 oz 207 lb 0.2 oz  Weight (kg) 94 kg 92.398 kg 93.9 kg      Telemetry    AF rates ~ 100- Personally Reviewed  ECG    No new tracings - Personally Reviewed  Physical Exam   GEN: No acute distress.   Neck: No JVD Cardiac: IRIR, no murmurs, rubs, or gallops.  Respiratory: decreased breath sounds without rales GI: Soft, nontender, non-distended  MS: trace LE edema; No  deformity. Neuro:  Nonfocal  Psych: Normal affect   Through exam weaned down to 88- 92 O2 on RA  Labs    High Sensitivity Troponin:   Recent Labs  Lab 07/28/20 1051 07/28/20 1249  TROPONINIHS 2 2      Chemistry Recent Labs  Lab 07/31/20 1734 08/01/20 0042 08/03/20 0208 08/04/20 0249 08/05/20 0026  NA 129*   < > 129* 129* 129*  K 3.9   < > 4.3 3.6 3.8  CL 89*   < > 90* 87* 88*  CO2 31   < > 33* 31 32  GLUCOSE 100*   < > 118* 120* 148*  BUN 11   < > 11 15 18   CREATININE 0.60*   < > 0.54* 0.70 0.80  CALCIUM 9.2   < > 8.9 9.3 9.2  PROT 6.4*  --   --   --   --   GFRNONAA >60   < > >60 >60 >60  ANIONGAP 9   < > 6 11 9    < > = values in this interval not displayed.     Hematology Recent  Labs  Lab 08/02/20 0225 08/03/20 0208 08/04/20 0249  WBC 8.0 9.3 8.3  RBC 4.30 4.44 4.53  HGB 14.0 14.4 14.5  HCT 41.6 42.8 44.1  MCV 96.7 96.4 97.4  MCH 32.6 32.4 32.0  MCHC 33.7 33.6 32.9  RDW 13.1 13.1 13.3  PLT 178 213 235    BNP Recent Labs  Lab 07/31/20 1037  BNP 245.9*     DDimer  No results for input(s): DDIMER in the last 168 hours.    Radiology    DG Chest 2 View  Result Date: 08/03/2020 CLINICAL DATA:  Dyspnea. EXAM: CHEST - 2 VIEW COMPARISON:  August 01, 2020. FINDINGS: Mild cardiomegaly is noted. No pneumothorax is noted. Old right rib fractures are noted. Left lung is clear. Mild right pleural effusion is noted with associated right basilar atelectasis or infiltrate. IMPRESSION: Mild right pleural effusion is noted with associated right basilar atelectasis or infiltrate. Electronically Signed   By: Lupita Raider M.D.   On: 08/03/2020 13:18    Cardiac Studies   Echocardiogram 07/29/20: IMPRESSIONS    1. Left ventricular ejection fraction, by estimation, is 55 to 60%. The  left ventricle has normal function. The left ventricle has no regional  wall motion abnormalities. Left ventricular diastolic function could not  be evaluated.   2. Prominent  moderator band of no clinical significance. Right  ventricular systolic function is mildly reduced. The right ventricular  size is normal. There is mildly elevated pulmonary artery systolic  pressure. The estimated right ventricular systolic  pressure is 36.5 mmHg.   3. The mitral valve is normal in structure. No evidence of mitral valve  regurgitation. No evidence of mitral stenosis.   4. The aortic valve is normal in structure. Aortic valve regurgitation is  not visualized. No aortic stenosis is present.   5. The inferior vena cava is dilated in size with <50% respiratory  variability, suggesting right atrial pressure of 15 mmHg.  NST 07/30/20: 1. 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 No definite evidence of reversibility is noted to suggest acute ischemia.   2. Normal left ventricular wall motion.   3. Left ventricular ejection fraction 65%   4. Non invasive risk stratification*: Low  TEE/DCCV 08/02/20: 1. Left ventricular ejection fraction, by estimation, is 55 to 60%. The  left ventricle has normal function.   2. Right ventricular systolic function is normal. The right ventricular  size is normal.   3. Left atrial size was mild to moderately dilated. No left atrial/left  atrial appendage thrombus was detected.   4. Right atrial size was moderately dilated.   5. The mitral valve is normal in structure. Mild mitral valve  regurgitation. No evidence of mitral stenosis.   6. The aortic valve is tricuspid. There is mild calcification of the  aortic valve. There is mild thickening of the aortic valve. Aortic valve  regurgitation is not visualized. Mild aortic valve sclerosis is present,  with no evidence of aortic valve  stenosis.   7. There is Moderate (Grade III) plaque.   8. Successful DCCV performed (200J x1) after TEE with return to NSR.  Please see separate note.    Patient Profile     70 y.o. male  with a  PMH of coronary artery calcifications, HTN, HLD, BPH s/p TURP, depression, and tobacco abuse,  who is being followed by cardiology for the evaluation of new onset atrial fibrillation  Assessment & Plan  Hypoxic Respiratory Failure HFpEF and COPD HTN With AF CHASVASC 4 - NYHA class III, Stage C, hypervolemic, etiology from AF - s/p right thoracentesis 07/31/20 with 1.5L removed. - will continue IV lasix 08/05/20; will recheck BNP 08/06/20 with potential PO Transition - weaned O2 during exam- given COPD hx may accept lower O2 goal - Continue losartan and metoprolol succinate - Continue supportive care per primary team   - Continue eliquis 5mg  BID for stroke ppx - average Qtc normalized 08/04/20 - Will start amiodarone 200mg  BID x14 days total course from 08/04/20, then 200mg  daily for rhythm control - Would benefit from an outpatient sleep study to r/o OSA - discussed alcohol cessation with patient and daughter 08/04/20   Coronary artery calcifications:  HLD - Continue statin - Continue aggressive risk factor modifications - goal BP <130/80, LDL <70, A1C <7, smoking cessation   Hyponatremia: Na 124 on admission, stable at 129 the past several days. Likely hypervolemic - Continue to monitor closely with diuresis as above    Tobacco abuse: smokes 1ppd which he has for the past 50 years. Cessation advised. Suspicions high for underlying COPD - Continue to encourage cessation   ETOH abuse: drinks 3-4 cocktails 3-4 days per week. Discussed relationship between ETOH abuse and Afib. - Continue to encourage cessation     For questions or updates, please contact [facility] Please consult [email or web address] for contact info under     08/06/20, MD Cardiologist [facility]  [address] [place], [address] [place], [address] Samuel Barnes [phone]  9:45 AM

## 2020-08-06 DIAGNOSIS — I5031 Acute diastolic (congestive) heart failure: Secondary | ICD-10-CM | POA: Diagnosis not present

## 2020-08-06 DIAGNOSIS — I4891 Unspecified atrial fibrillation: Secondary | ICD-10-CM | POA: Diagnosis not present

## 2020-08-06 DIAGNOSIS — J9601 Acute respiratory failure with hypoxia: Secondary | ICD-10-CM | POA: Diagnosis not present

## 2020-08-06 LAB — BASIC METABOLIC PANEL
Anion gap: 12 (ref 5–15)
BUN: 18 mg/dL (ref 8–23)
CO2: 28 mmol/L (ref 22–32)
Calcium: 9.1 mg/dL (ref 8.9–10.3)
Chloride: 89 mmol/L — ABNORMAL LOW (ref 98–111)
Creatinine, Ser: 0.7 mg/dL (ref 0.61–1.24)
GFR, Estimated: >60 mL/min (ref >60)
Glucose, Bld: 147 mg/dL — ABNORMAL HIGH (ref 70–99)
Potassium: 3.8 mmol/L (ref 3.5–5.1)
Sodium: 129 mmol/L — ABNORMAL LOW (ref 135–145)

## 2020-08-06 LAB — BRAIN NATRIURETIC PEPTIDE: B Natriuretic Peptide: 446.7 pg/mL — ABNORMAL HIGH (ref 0.0–100.0)

## 2020-08-06 LAB — MAGNESIUM: Magnesium: 1.8 mg/dL (ref 1.7–2.4)

## 2020-08-06 MED ORDER — MELATONIN 3 MG PO TABS
3.0000 mg | ORAL_TABLET | Freq: Once | ORAL | Status: AC
  Administered 2020-08-06: 3 mg via ORAL
  Filled 2020-08-06: qty 1

## 2020-08-06 NOTE — Progress Notes (Signed)
[id number] Patient ID: [id number] TUBBS, male   DOB: 1950/02/19, 70 y.o.   MRN: [id number]  PROGRESS NOTE    TIMOTHEUS SALM  QVZ:[id number] DOB: 02/19/1950 DOA: 07/28/2020 PCP: Pcp, No   Brief Narrative:  70 y.o. male with a history of aortic atherosclerosis, BPH, obesity, coronary artery calcification, depression, hyperlipidemia, hypertension, alcohol and tobacco use presented with worsening shortness of breath.  He was found to have acute diastolic heart failure, paroxysmal new onset atrial fibrillation and pleural effusion requiring oxygen supplementation.  He was started on IV Lasix and heparin.  Cardiology was consulted.  He also underwent right-sided thoracentesis.  He underwent TEE/DCCV by cardiology on 08/02/2020.  Assessment & Plan:   Acute hypoxic respiratory failure -Possibly from heart failure exacerbation and pleural effusion -Required up to 8 L oxygen during this hospitalization.   -Still on 2L oxygen via nasal cannula.  Wean off as able.   Acute probable diastolic heart failure -Echo showed normal EF however LV diastolic dysfunction could not be evaluated -Cardiology following.  Continue strict input and output.  Daily weights.  Continue IV Lasix.  Fluid restriction.  Continue metoprolol and losartan.  Negative balance of 8253.1 cc since admission  Paroxysmal A. fib, new onset -Currently rate controlled.  He has been switched to Eliquis by cardiology. -Status post TEE/DCCV on 08/02/2020.  Continue metoprolol.  Cardiology has started him on oral amiodarone.  Possible COPD -Patient possibly has underlying undiagnosed COPD.  Continue current inhalers.  Evaluation and follow-up by pulmonary as an outpatient.  Hyponatremia -Possibly from fluid overload.  Sodium 124 on admission. Still 129 today.  Monitor  Hypertension -Blood pressure stable.  Continue metoprolol, Lasix and losartan.  Amlodipine on hold.  Coronary artery calcification Hyperlipidemia -Continue statin.   On lovastatin as an outpatient  BPH -Continue terazosin  Alcohol use -No signs of withdrawal.  Continue thiamine, multivitamin and folic acid  Tobacco use -Smoking cessation discussed by prior hospitalist.  Patient declined nicotine replacement therapy. -Outpatient follow-up.  Continue DuoNeb as needed  Fatty liver -Seen on imaging.  Likely related to alcohol consumption.  Outpatient follow-up  Generalized deconditioning -PT recommends home health PT  Depression -Continue Zoloft  Hyperglycemia/prediabetes -A1c 5.7.  Outpatient follow-up.  DVT prophylaxis: Eliquis Code Status: Full Family Communication: Daughter at bedside Disposition Plan: Status is: Inpatient  Remains inpatient appropriate because:Inpatient level of care appropriate due to severity of illness  Dispo: The patient is from: Home              Anticipated d/c is to: Home in 1 day once cleared by cardiology and patient improves medically              Patient currently is not medically stable to d/c.   Difficult to place patient No  Consultants: Cardiology  Procedures: Echo TEE/DCCV on 08/02/2020  Antimicrobials: None   Subjective: Patient seen and examined at bedside.  Does not feel well enough and has intermittent shortness of breath with minimal exertion.  Denies worsening chest or abdominal pain.  No overnight fever or vomiting reported. Objective: Vitals:   08/06/20 0350 08/06/20 0500 08/06/20 0735 08/06/20 0854  BP:  114/77  126/88  Pulse:  73 78   Resp:  18  20  Temp:  97.9 F (36.6 C)    TempSrc:  Oral    SpO2:  94% 92% 90%  Weight: 94 kg     Height:        Intake/Output Summary (Last 24 hours) at 08/06/2020  1059 Last data filed at 08/06/2020 0100 Gross per 24 hour  Intake 360 ml  Output 452 ml  Net -92 ml    Filed Weights   08/04/20 0737 08/05/20 0530 08/06/20 0350  Weight: 92.4 kg 94 kg 94 kg    Examination:  General exam: On 2 L oxygen via nasal cannula.  No acute  distress.  Respiratory system: Bilateral decreased breath sounds at bases with scattered crackles  cardiovascular system: S1-S2 heard, rate controlled  gastrointestinal system: Abdomen is mildly distended, soft and nontender.  Normal bowel sounds heard  extremities: No cyanosis; trace lower extremity edema present Central nervous system: Alert and oriented.  No focal neurological deficits.  Moving extremities  skin: No obvious petechiae/lesions  psychiatry: Judgment, affect and mood are normal  Data Reviewed: I have personally reviewed following labs and imaging studies  CBC: Recent Labs  Lab 07/31/20 0347 08/01/20 0042 08/02/20 0225 08/03/20 0208 08/04/20 0249  WBC 7.8 9.2 8.0 9.3 8.3  HGB 14.4 14.7 14.0 14.4 14.5  HCT 42.8 42.7 41.6 42.8 44.1  MCV 95.1 95.5 96.7 96.4 97.4  PLT 160 186 178 213 235    Basic Metabolic Panel: Recent Labs  Lab 08/02/20 0225 08/03/20 0208 08/04/20 0249 08/05/20 0026 08/06/20 0106  NA 129* 129* 129* 129* 129*  K 3.7 4.3 3.6 3.8 3.8  CL 91* 90* 87* 88* 89*  CO2 29 33* 31 32 28  GLUCOSE 98 118* 120* 148* 147*  BUN 11 11 15 18 18   CREATININE 0.51* 0.54* 0.70 0.80 0.70  CALCIUM 8.6* 8.9 9.3 9.2 9.1  MG  --   --  2.0 1.8 1.8    GFR: Estimated Creatinine Clearance: 94.2 mL/min (by C-G formula based on SCr of 0.7 mg/dL). Liver Function Tests: Recent Labs  Lab 07/31/20 1734  PROT 6.4*    No results for input(s): LIPASE, AMYLASE in the last 168 hours. No results for input(s): AMMONIA in the last 168 hours. Coagulation Profile: No results for input(s): INR, PROTIME in the last 168 hours. Cardiac Enzymes: No results for input(s): CKTOTAL, CKMB, CKMBINDEX, TROPONINI in the last 168 hours. BNP (last 3 results) No results for input(s): PROBNP in the last 8760 hours. HbA1C: No results for input(s): HGBA1C in the last 72 hours. CBG: No results for input(s): GLUCAP in the last 168 hours. Lipid Profile: No results for input(s): CHOL, HDL,  LDLCALC, TRIG, CHOLHDL, LDLDIRECT in the last 72 hours. Thyroid Function Tests: No results for input(s): TSH, T4TOTAL, FREET4, T3FREE, THYROIDAB in the last 72 hours. Anemia Panel: No results for input(s): VITAMINB12, FOLATE, FERRITIN, TIBC, IRON, RETICCTPCT in the last 72 hours. Sepsis Labs: No results for input(s): PROCALCITON, LATICACIDVEN in the last 168 hours.   Recent Results (from the past 240 hour(s))  Resp Panel by RT-PCR (Flu A&B, Covid) Nasopharyngeal Swab     Status: None   Collection Time: 07/28/20 10:51 AM   Specimen: Nasopharyngeal Swab; Nasopharyngeal(NP) swabs in vial transport medium  Result Value Ref Range Status   SARS Coronavirus 2 by RT PCR NEGATIVE NEGATIVE Final    Comment: (NOTE) SARS-CoV-2 target nucleic acids are NOT DETECTED.  The SARS-CoV-2 RNA is generally detectable in upper respiratory specimens during the acute phase of infection. The lowest concentration of SARS-CoV-2 viral copies this assay can detect is 138 copies/mL. A negative result does not preclude SARS-Cov-2 infection and should not be used as the sole basis for treatment or other patient management decisions. A negative result may occur with  improper specimen collection/handling, submission of specimen other than nasopharyngeal swab, presence of viral mutation(s) within the areas targeted by this assay, and inadequate number of viral copies(<138 copies/mL). A negative result must be combined with clinical observations, patient history, and epidemiological information. The expected result is Negative.  Fact Sheet for Patients:  BloggerCourse.com  Fact Sheet for Healthcare Providers:  SeriousBroker.it  This test is no t yet approved or cleared by the [place] FDA and  has been authorized for detection and/or diagnosis of SARS-CoV-2 by FDA under an Emergency Use Authorization (EUA). This EUA will remain  in effect (meaning this test  can be used) for the duration of the COVID-19 declaration under Section 564(b)(1) of the Act, 21 U.S.C.section 360bbb-3(b)(1), unless the authorization is terminated  or revoked sooner.       Influenza A by PCR NEGATIVE NEGATIVE Final   Influenza B by PCR NEGATIVE NEGATIVE Final    Comment: (NOTE) The Xpert Xpress SARS-CoV-2/FLU/RSV plus assay is intended as an aid in the diagnosis of influenza from Nasopharyngeal swab specimens and should not be used as a sole basis for treatment. Nasal washings and aspirates are unacceptable for Xpert Xpress SARS-CoV-2/FLU/RSV testing.  Fact Sheet for Patients: BloggerCourse.com  Fact Sheet for Healthcare Providers: SeriousBroker.it  This test is not yet approved or cleared by the [place] FDA and has been authorized for detection and/or diagnosis of SARS-CoV-2 by FDA under an Emergency Use Authorization (EUA). This EUA will remain in effect (meaning this test can be used) for the duration of the COVID-19 declaration under Section 564(b)(1) of the Act, 21 U.S.C. section 360bbb-3(b)(1), unless the authorization is terminated or revoked.  Performed at [facility], [address] [place], [place] [address]   Blood culture (routine x 2)     Status: None   Collection Time: 07/28/20  2:43 PM   Specimen: Left Antecubital; Blood  Result Value Ref Range Status   Specimen Description   Final    LEFT ANTECUBITAL BLOOD Performed at [facility], [address] [place], [place] [address]    Special Requests   Final    BOTTLES DRAWN AEROBIC AND ANAEROBIC Blood Culture adequate volume Performed at [facility], [address] [place], [place] [address]    Culture   Final    NO GROWTH 5 DAYS Performed at [facility], [address], [place], [place] [address]    Report Status 08/02/2020 FINAL  Final  Blood culture (routine x 2)     Status: None    Collection Time: 07/28/20  2:53 PM   Specimen: BLOOD RIGHT HAND  Result Value Ref Range Status   Specimen Description   Final    BLOOD RIGHT HAND BLOOD Performed at [facility], [address] [place], [place] [address]    Special Requests   Final    BOTTLES DRAWN AEROBIC AND ANAEROBIC Blood Culture adequate volume Performed at [facility], [address] [place], [place] [address]    Culture   Final    NO GROWTH 5 DAYS Performed at [facility], [address] [place], [place] [address]    Report Status 08/02/2020 FINAL  Final  C Difficile Quick Screen w PCR reflex     Status: None   Collection Time: 07/28/20 11:55 PM   Specimen: STOOL  Result Value Ref Range Status   C Diff antigen NEGATIVE NEGATIVE Final  C Diff toxin NEGATIVE NEGATIVE Final   C Diff interpretation No C. difficile detected.  Final    Comment: Performed at [facility], [address] [place], [place] [address]  Body fluid culture w Gram Stain     Status: None   Collection Time: 07/31/20  4:16 PM   Specimen: Lung, Right; Pleural Fluid  Result Value Ref Range Status   Specimen Description PLEURAL FLUID  Final   Special Requests LUNG RIGHT  Final   Gram Stain   Final    FEW WBC PRESENT, PREDOMINANTLY MONONUCLEAR NO ORGANISMS SEEN    Culture   Final    NO GROWTH Performed at [facility], [address] [place], [place] [address]    Report Status 08/03/2020 FINAL  Final          Radiology Studies: No results found.      Scheduled Meds:  amiodarone  200 mg Oral BID   Followed by   Melene Muller[START ON 08/18/2020] amiodarone  200 mg Oral Daily   apixaban  5 mg Oral BID   FLUoxetine  20 mg Oral QHS   folic acid  1 mg Oral Daily   furosemide  80 mg Intravenous TID   losartan  50 mg Oral Daily   metoprolol succinate  100 mg Oral Daily   mometasone-formoterol  2 puff Inhalation BID   montelukast  10 mg Oral Daily   multivitamin with minerals  1 tablet Oral  Daily   potassium chloride  20 mEq Oral BID   pravastatin  20 mg Oral q1800   senna-docusate  1 tablet Oral BID   terazosin  5 mg Oral Daily   thiamine  100 mg Oral Daily   Continuous Infusions:          Glade LloydKshitiz Jenna Ardoin, MD [facility] 08/06/2020, 10:59 AM

## 2020-08-06 NOTE — Progress Notes (Signed)
Progress Note  Patient Name: Samuel Barnes Date of Encounter: 08/06/2020  [facility] Cardiologist: Armanda Magic, MD   Subjective   BNP elevated this AM; back on IV lasix.  Patient notes SOB overnight; nursing notes O2 sat dropping to 87 off room air  Inpatient Medications    Scheduled Meds:  amiodarone  200 mg Oral BID   Followed by   Melene Muller ON 08/18/2020] amiodarone  200 mg Oral Daily   apixaban  5 mg Oral BID   FLUoxetine  20 mg Oral QHS   folic acid  1 mg Oral Daily   furosemide  80 mg Intravenous TID   losartan  50 mg Oral Daily   metoprolol succinate  100 mg Oral Daily   mometasone-formoterol  2 puff Inhalation BID   montelukast  10 mg Oral Daily   multivitamin with minerals  1 tablet Oral Daily   potassium chloride  20 mEq Oral BID   pravastatin  20 mg Oral q1800   senna-docusate  1 tablet Oral BID   terazosin  5 mg Oral Daily   thiamine  100 mg Oral Daily   Continuous Infusions:  PRN Meds: acetaminophen **OR** acetaminophen, bisacodyl, ipratropium-albuterol, lidocaine, polyethylene glycol, prochlorperazine   Vital Signs    Vitals:   08/06/20 0350 08/06/20 0500 08/06/20 0735 08/06/20 0854  BP:  114/77  126/88  Pulse:  73 78   Resp:  18  20  Temp:  97.9 F (36.6 C)    TempSrc:  Oral    SpO2:  94% 92% 90%  Weight: 94 kg     Height:        Intake/Output Summary (Last 24 hours) at 08/06/2020 0919 Last data filed at 08/06/2020 0100 Gross per 24 hour  Intake 360 ml  Output 452 ml  Net -92 ml   Last 3 Weights 08/06/2020 08/05/2020 08/04/2020  Weight (lbs) 207 lb 3.7 oz 207 lb 3.7 oz 203 lb 11.2 oz  Weight (kg) 94 kg 94 kg 92.398 kg      Telemetry    Rate controlled AF- Personally Reviewed  ECG    No new tracings - Personally Reviewed  Physical Exam   GEN: No acute distress.   Neck: No JVD Cardiac: IRIR, no murmurs, rubs, or gallops.  Respiratory: decreased breath sounds without rales GI: Soft, will dullness to percussion and  positive fluid wave MS: trace LE edema; No deformity. Neuro:  Nonfocal  Psych: Normal affect   Labs    High Sensitivity Troponin:   Recent Labs  Lab 07/28/20 1051 07/28/20 1249  TROPONINIHS 2 2      Chemistry Recent Labs  Lab 07/31/20 1734 08/01/20 0042 08/04/20 0249 08/05/20 0026 08/06/20 0106  NA 129*   < > 129* 129* 129*  K 3.9   < > 3.6 3.8 3.8  CL 89*   < > 87* 88* 89*  CO2 31   < > 31 32 28  GLUCOSE 100*   < > 120* 148* 147*  BUN 11   < > 15 18 18   CREATININE 0.60*   < > 0.70 0.80 0.70  CALCIUM 9.2   < > 9.3 9.2 9.1  PROT 6.4*  --   --   --   --   GFRNONAA >60   < > >60 >60 >60  ANIONGAP 9   < > 11 9 12    < > = values in this interval not displayed.     Hematology Recent Labs  Lab 08/02/20 0225 08/03/20 0208 08/04/20 0249  WBC 8.0 9.3 8.3  RBC 4.30 4.44 4.53  HGB 14.0 14.4 14.5  HCT 41.6 42.8 44.1  MCV 96.7 96.4 97.4  MCH 32.6 32.4 32.0  MCHC 33.7 33.6 32.9  RDW 13.1 13.1 13.3  PLT 178 213 235    BNP Recent Labs  Lab 07/31/20 1037 08/06/20 0329  BNP 245.9* 446.7*     DDimer  No results for input(s): DDIMER in the last 168 hours.    Radiology    No results found.  Cardiac Studies   Echocardiogram 07/29/20: IMPRESSIONS    1. Left ventricular ejection fraction, by estimation, is 55 to 60%. The  left ventricle has normal function. The left ventricle has no regional  wall motion abnormalities. Left ventricular diastolic function could not  be evaluated.   2. Prominent moderator band of no clinical significance. Right  ventricular systolic function is mildly reduced. The right ventricular  size is normal. There is mildly elevated pulmonary artery systolic  pressure. The estimated right ventricular systolic  pressure is 36.5 mmHg.   3. The mitral valve is normal in structure. No evidence of mitral valve  regurgitation. No evidence of mitral stenosis.   4. The aortic valve is normal in structure. Aortic valve regurgitation is  not  visualized. No aortic stenosis is present.   5. The inferior vena cava is dilated in size with <50% respiratory  variability, suggesting right atrial pressure of 15 mmHg.  NST 07/30/20: 1. 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 No definite evidence of reversibility is noted to suggest acute ischemia.   2. Normal left ventricular wall motion.   3. Left ventricular ejection fraction 65%   4. Non invasive risk stratification*: Low  TEE/DCCV 08/02/20: 1. Left ventricular ejection fraction, by estimation, is 55 to 60%. The  left ventricle has normal function.   2. Right ventricular systolic function is normal. The right ventricular  size is normal.   3. Left atrial size was mild to moderately dilated. No left atrial/left  atrial appendage thrombus was detected.   4. Right atrial size was moderately dilated.   5. The mitral valve is normal in structure. Mild mitral valve  regurgitation. No evidence of mitral stenosis.   6. The aortic valve is tricuspid. There is mild calcification of the  aortic valve. There is mild thickening of the aortic valve. Aortic valve  regurgitation is not visualized. Mild aortic valve sclerosis is present,  with no evidence of aortic valve  stenosis.   7. There is Moderate (Grade III) plaque.   8. Successful DCCV performed (200J x1) after TEE with return to NSR.  Please see separate note.    Patient Profile     70 y.o. male  with a PMH of coronary artery calcifications, HTN, HLD, BPH s/p TURP, depression, and tobacco abuse,  who is being followed by cardiology for the evaluation of new onset atrial fibrillation  Assessment & Plan     Hypoxic Respiratory Failure HFpEF and COPD HTN With AF CHASVASC 4 - NYHA class III, Stage C, hypervolemic, etiology from AF - s/p right thoracentesis 07/31/20 with 1.5L removed. - on 80 IV lasix TID - Continue losartan and metoprolol succinate - Continue  supportive care per primary team   - Continue eliquis 5mg  BID for stroke ppx - average Qtc normalized 08/04/20 - On PO amiodarone 200mg  BID x14 days total course from 08/04/20, then 200mg  daily for  rhythm control - Would benefit from an outpatient sleep study to r/o OSA - discussed alcohol cessation with patient and family in through admission   Coronary artery calcifications:  HLD - Continue statin - Continue aggressive risk factor modifications - goal BP <130/80, LDL <70, A1C <7, smoking cessation   Hyponatremia: Na 124 on admission, stable at 129 the past several days - Continue to monitor closely with diuresis as above    Tobacco abuse: smokes 1ppd which he has for the past 50 years. Cessation advised. Suspicions high for underlying COPD - Continue to encourage cessation   ETOH abuse: drinks 3-4 cocktails 3-4 days per week. Discussed relationship between ETOH abuse and Afib. - Continue to encourage cessation at above    Discussed with primary MD and nursing; if we can resolve/stablize O2 requirement with continued high dose lasix may be able to transition 08/07/20  For questions or updates, please contact [facility] Please consult [email or web address] for contact info under     Riley Lam, MD Cardiologist [facility]  [address] [place], [address] [place], [place] [address] [phone]  9:19 AM

## 2020-08-07 DIAGNOSIS — J9601 Acute respiratory failure with hypoxia: Secondary | ICD-10-CM | POA: Diagnosis not present

## 2020-08-07 LAB — BASIC METABOLIC PANEL
Anion gap: 10 (ref 5–15)
BUN: 21 mg/dL (ref 8–23)
CO2: 28 mmol/L (ref 22–32)
Calcium: 9 mg/dL (ref 8.9–10.3)
Chloride: 90 mmol/L — ABNORMAL LOW (ref 98–111)
Creatinine, Ser: 0.65 mg/dL (ref 0.61–1.24)
GFR, Estimated: >60 mL/min (ref >60)
Glucose, Bld: 146 mg/dL — ABNORMAL HIGH (ref 70–99)
Potassium: 3.9 mmol/L (ref 3.5–5.1)
Sodium: 128 mmol/L — ABNORMAL LOW (ref 135–145)

## 2020-08-07 LAB — MAGNESIUM: Magnesium: 1.9 mg/dL (ref 1.7–2.4)

## 2020-08-07 MED ORDER — FUROSEMIDE 40 MG PO TABS
80.0000 mg | ORAL_TABLET | Freq: Two times a day (BID) | ORAL | Status: DC
  Administered 2020-08-07 – 2020-08-09 (×3): 80 mg via ORAL
  Filled 2020-08-07 (×3): qty 2

## 2020-08-07 NOTE — Progress Notes (Signed)
[id number] Patient ID: Samuel Barnes, male   DOB: 26-Jan-1950, 70 y.o.   MRN: [id number]  PROGRESS NOTE    Samuel Barnes  CWC:[id number] DOB: 1950/11/03 DOA: 07/28/2020 PCP: Pcp, No   Brief Narrative:  70 y.o. male with a history of aortic atherosclerosis, BPH, obesity, coronary artery calcification, depression, hyperlipidemia, hypertension, alcohol and tobacco use presented with worsening shortness of breath.  He was found to have acute diastolic heart failure, paroxysmal new onset atrial fibrillation and pleural effusion requiring oxygen supplementation.  He was started on IV Lasix and heparin.  Cardiology was consulted.  He also underwent right-sided thoracentesis.  He underwent TEE/DCCV by cardiology on 08/02/2020.  Assessment & Plan:   Acute hypoxic respiratory failure -Possibly from heart failure exacerbation and pleural effusion -Required up to 8 L oxygen during this hospitalization.   -Still on 2.5L oxygen via nasal cannula.  Wean off as able.  Might need supplemental oxygen upon discharge as well.   Acute probable diastolic heart failure -Echo showed normal EF however LV diastolic dysfunction could not be evaluated -Cardiology following.  Continue strict input and output.  Daily weights.  Continue IV Lasix.  Fluid restriction.  Continue metoprolol and losartan.  Negative balance of 9253.1 cc since admission  Paroxysmal A. fib, new onset -Currently rate controlled.  He has been switched to Eliquis by cardiology. -Status post TEE/DCCV on 08/02/2020.  Continue metoprolol.  Cardiology has started him on oral amiodarone.  Possible COPD -Patient possibly has underlying undiagnosed COPD.  Continue current inhalers.  Evaluation and follow-up by pulmonary as an outpatient.  Hyponatremia -Possibly from fluid overload.  Sodium 124 on admission. Still 128 today.  Monitor  Hypertension -Blood pressure stable.  Continue metoprolol, Lasix and losartan.  Amlodipine on hold.  Coronary  artery calcification Hyperlipidemia -Continue statin.  On lovastatin as an outpatient  BPH -Continue terazosin  Alcohol use -No signs of withdrawal.  Continue thiamine, multivitamin and folic acid  Tobacco use -Smoking cessation discussed by prior hospitalist.  Patient declined nicotine replacement therapy. -Outpatient follow-up.  Continue DuoNeb as needed  Fatty liver -Seen on imaging.  Likely related to alcohol consumption.  Outpatient follow-up  Generalized deconditioning -PT recommends home health PT  Depression -Continue Zoloft  Hyperglycemia/prediabetes -A1c 5.7.  Outpatient follow-up.  DVT prophylaxis: Eliquis Code Status: Full Family Communication: Daughter at bedside Disposition Plan: Status is: Inpatient  Remains inpatient appropriate because:Inpatient level of care appropriate due to severity of illness  Dispo: The patient is from: Home              Anticipated d/c is to: Home in 1 day once cleared by cardiology and patient improves medically              Patient currently is not medically stable to d/c.   Difficult to place patient No  Consultants: Cardiology  Procedures: Echo TEE/DCCV on 08/02/2020  Antimicrobials: None   Subjective: Patient seen and examined at bedside.  Still short of breath with slight exertion.  Feels a little better compared to yesterday.  Denies any overnight chest pain.  Does not feel ready to go home today.   Objective: Vitals:   08/06/20 2054 08/07/20 0158 08/07/20 0519 08/07/20 0759  BP: 117/75  113/76   Pulse: 75  77 75  Resp: 20  14 20   Temp: 99.1 F (37.3 C)  97.9 F (36.6 C)   TempSrc: Oral  Oral   SpO2: 90%  96% 93%  Weight:  94 kg  Height:        Intake/Output Summary (Last 24 hours) at 08/07/2020 1051 Last data filed at 08/06/2020 2058 Gross per 24 hour  Intake 360 ml  Output 1600 ml  Net -1240 ml    Filed Weights   08/05/20 0530 08/06/20 0350 08/07/20 0158  Weight: 94 kg 94 kg 94 kg     Examination:  General exam: No acute distress.  On 2-1/2 L oxygen via nasal cannula.   Respiratory system: Decreased breath sounds at bases bilaterally with bibasilar crackles  cardiovascular system: Rate controlled, S1-S2 heard gastrointestinal system: Abdomen is distended slightly, soft and nontender.  Bowel sounds are heard  extremities: Mild lower extremity edema present; no clubbing  Central nervous system: Awake and alert.  No focal neurological deficits.  Moves extremities skin: No obvious ecchymosis/rashes  psychiatry: Mood, affect and judgment are normal.  Data Reviewed: I have personally reviewed following labs and imaging studies  CBC: Recent Labs  Lab 08/01/20 0042 08/02/20 0225 08/03/20 0208 08/04/20 0249  WBC 9.2 8.0 9.3 8.3  HGB 14.7 14.0 14.4 14.5  HCT 42.7 41.6 42.8 44.1  MCV 95.5 96.7 96.4 97.4  PLT 186 178 213 235    Basic Metabolic Panel: Recent Labs  Lab 08/03/20 0208 08/04/20 0249 08/05/20 0026 08/06/20 0106 08/07/20 0214  NA 129* 129* 129* 129* 128*  K 4.3 3.6 3.8 3.8 3.9  CL 90* 87* 88* 89* 90*  CO2 33* 31 32 28 28  GLUCOSE 118* 120* 148* 147* 146*  BUN 11 15 18 18 21   CREATININE 0.54* 0.70 0.80 0.70 0.65  CALCIUM 8.9 9.3 9.2 9.1 9.0  MG  --  2.0 1.8 1.8 1.9    GFR: Estimated Creatinine Clearance: 94.2 mL/min (by C-G formula based on SCr of 0.65 mg/dL). Liver Function Tests: Recent Labs  Lab 07/31/20 1734  PROT 6.4*    No results for input(s): LIPASE, AMYLASE in the last 168 hours. No results for input(s): AMMONIA in the last 168 hours. Coagulation Profile: No results for input(s): INR, PROTIME in the last 168 hours. Cardiac Enzymes: No results for input(s): CKTOTAL, CKMB, CKMBINDEX, TROPONINI in the last 168 hours. BNP (last 3 results) No results for input(s): PROBNP in the last 8760 hours. HbA1C: No results for input(s): HGBA1C in the last 72 hours. CBG: No results for input(s): GLUCAP in the last 168 hours. Lipid  Profile: No results for input(s): CHOL, HDL, LDLCALC, TRIG, CHOLHDL, LDLDIRECT in the last 72 hours. Thyroid Function Tests: No results for input(s): TSH, T4TOTAL, FREET4, T3FREE, THYROIDAB in the last 72 hours. Anemia Panel: No results for input(s): VITAMINB12, FOLATE, FERRITIN, TIBC, IRON, RETICCTPCT in the last 72 hours. Sepsis Labs: No results for input(s): PROCALCITON, LATICACIDVEN in the last 168 hours.   Recent Results (from the past 240 hour(s))  Blood culture (routine x 2)     Status: None   Collection Time: 07/28/20  2:43 PM   Specimen: Left Antecubital; Blood  Result Value Ref Range Status   Specimen Description   Final    LEFT ANTECUBITAL BLOOD Performed at [facility], [address] [place], [place]    Special Requests   Final    BOTTLES DRAWN AEROBIC AND ANAEROBIC Blood Culture adequate volume Performed at [facility], [address] [place], [place]    Culture   Final    NO GROWTH 5 DAYS Performed at [facility], [address], [place], [place]  [address]    Report Status 08/02/2020 FINAL  Final  Blood culture (routine x 2)     Status: None   Collection Time: 07/28/20  2:53 PM   Specimen: BLOOD RIGHT HAND  Result Value Ref Range Status   Specimen Description   Final    BLOOD RIGHT HAND BLOOD Performed at [facility], [address] [place], [place] [address]    Special Requests   Final    BOTTLES DRAWN AEROBIC AND ANAEROBIC Blood Culture adequate volume Performed at [facility], [address] [place], [place] [address]    Culture   Final    NO GROWTH 5 DAYS Performed at [facility], [address], [place], [place] [address]    Report Status 08/02/2020 FINAL  Final  C Difficile Quick Screen w PCR reflex     Status: None   Collection Time: 07/28/20 11:55 PM   Specimen: STOOL  Result Value Ref Range Status   C Diff antigen NEGATIVE NEGATIVE Final   C Diff toxin  NEGATIVE NEGATIVE Final   C Diff interpretation No C. difficile detected.  Final    Comment: Performed at [facility], [address], [place], [place] [address]  Body fluid culture w Gram Stain     Status: None   Collection Time: 07/31/20  4:16 PM   Specimen: Lung, Right; Pleural Fluid  Result Value Ref Range Status   Specimen Description PLEURAL FLUID  Final   Special Requests LUNG RIGHT  Final   Gram Stain   Final    FEW WBC PRESENT, PREDOMINANTLY MONONUCLEAR NO ORGANISMS SEEN    Culture   Final    NO GROWTH Performed at [facility], [address], [place], [place] [address]    Report Status 08/03/2020 FINAL  Final          Radiology Studies: No results found.      Scheduled Meds:  amiodarone  200 mg Oral BID   Followed by   Melene Muller ON 08/18/2020] amiodarone  200 mg Oral Daily   apixaban  5 mg Oral BID   FLUoxetine  20 mg Oral QHS   folic acid  1 mg Oral Daily   furosemide  80 mg Intravenous TID   losartan  50 mg Oral Daily   metoprolol succinate  100 mg Oral Daily   mometasone-formoterol  2 puff Inhalation BID   montelukast  10 mg Oral Daily   multivitamin with minerals  1 tablet Oral Daily   potassium chloride  20 mEq Oral BID   pravastatin  20 mg Oral q1800   senna-docusate  1 tablet Oral BID   terazosin  5 mg Oral Daily   thiamine  100 mg Oral Daily   Continuous Infusions:          Glade Lloyd, MD [facility] 08/07/2020, 10:51 AM

## 2020-08-07 NOTE — Progress Notes (Signed)
Physical Therapy Treatment Patient Details Name: Samuel Barnes MRN: [id number] DOB: 1950-06-09 Today's Date: 08/07/2020    History of Present Illness 70 y.o. male  admitted 7/22   who is being followed by cardiology for the evaluation of new onset atrial fibrillation.  Underwent cardioversion on 7/27.  UYQ:[id number] artery calcifications, HTN, HLD, BPH s/p TURP, depression, and tobacco abuse    PT Comments    Pt admitted with above diagnosis. Pt reluctantly agreed to take a short walk reporting a lot of fatigue. Pt generally steady with the RW and states he will use it at home. Pt is requiring O2 at rest and with activity as well.  Will ciontinue to follow.  Pt currently with functional limitations due to balance and endurance deficits. Pt will benefit from skilled PT to increase their independence and safety with mobility to allow discharge to the venue listed below.      Follow Up Recommendations  Home health PT     Equipment Recommendations  Other (comment) (home O2)    Recommendations for Other Services       Precautions / Restrictions Precautions Precautions: Fall Restrictions Weight Bearing Restrictions: No    Mobility  Bed Mobility Overal bed mobility: Needs Assistance Bed Mobility: Supine to Sit     Supine to sit: Supervision          Transfers Overall transfer level: Needs assistance Equipment used: Rolling walker (2 wheeled) Transfers: Sit to/from Stand Sit to Stand: Supervision         General transfer comment: Slow power up, no LOB, and use of bed rails.  Ambulation/Gait Ambulation/Gait assistance: Min guard;Min assist Gait Distance (Feet): 45 Feet Assistive device: Rolling walker (2 wheeled);None Gait Pattern/deviations: Step-through pattern;Decreased stride length Gait velocity: decreased Gait velocity interpretation: <1.31 ft/sec, indicative of household ambulator General Gait Details: Pt on 2.5 L O2 at rest with sats 93%. Pt ambulated on  3L O2 to keep O2 >88%. When pt first started walking with RW, he picked up RW for a second to move it over and had a slight LOB which he recovered from with cuing.  Pt without LOB the rest of session.  Pt encouraged to not pick up RW but slide it for incr stability. Verbal cues provided to keep hips within RW. Intermittently demonstrated forward trunk lean.   Stairs             Wheelchair Mobility    Modified Rankin (Stroke Patients Only)       Balance Overall balance assessment: Needs assistance Sitting-balance support: Single extremity supported;Bilateral upper extremity supported Sitting balance-Leahy Scale: Fair Sitting balance - Comments: Intermittently alternates between UEs and uses BUEs for support while performing functional activity EOB   Standing balance support: Bilateral upper extremity supported;During functional activity Standing balance-Leahy Scale: Poor Standing balance comment: Demonstrated increased trunk flexion when standing without BUE support                            Cognition Arousal/Alertness: Awake/alert Behavior During Therapy: WFL for tasks assessed/performed Overall Cognitive Status: Within Functional Limits for tasks assessed                                        Exercises      General Comments        Pertinent Vitals/Pain Pain Assessment: No/denies pain  Home Living                      Prior Function            PT Goals (current goals can now be found in the care plan section) Acute Rehab PT Goals Patient Stated Goal: Return home and get stronger Progress towards PT goals: Progressing toward goals    Frequency    Min 3X/week      PT Plan Current plan remains appropriate    Co-evaluation              AM-PAC PT "6 Clicks" Mobility   Outcome Measure  Help needed turning from your back to your side while in a flat bed without using bedrails?: None Help needed moving from  lying on your back to sitting on the side of a flat bed without using bedrails?: None Help needed moving to and from a bed to a chair (including a wheelchair)?: None Help needed standing up from a chair using your arms (e.g., wheelchair or bedside chair)?: None Help needed to walk in hospital room?: A Little Help needed climbing 3-5 steps with a railing? : A Lot 6 Click Score: 21    End of Session Equipment Utilized During Treatment: Gait belt;Oxygen Activity Tolerance: Patient limited by fatigue Patient left: in bed;with call bell/phone within reach;with chair alarm set Nurse Communication: Mobility status PT Visit Diagnosis: Unsteadiness on feet (R26.81);Other abnormalities of gait and mobility (R26.89);Muscle weakness (generalized) (M62.81)     Time: 3220-2542 PT Time Calculation (min) (ACUTE ONLY): 19 min  Charges:  $Gait Training: 8-22 mins                     Abrahim Sargent M,PT Acute Rehab Services [phone] (pager)    Bevelyn Buckles 08/07/2020, 1:19 PM

## 2020-08-07 NOTE — Progress Notes (Signed)
SATURATION QUALIFICATIONS: (This note is used to comply with regulatory documentation for home oxygen)  Patient Saturations on Room Air at Rest = 87%  Patient Saturations on Room Air while Ambulating = NT as pt desats on RA at rest  Patient Saturations on 3 Liters of oxygen while Ambulating = 90%  Please briefly explain why patient needs home oxygen:Pt desats on RA at rest. Needs O2 at rest and with activity.  Kayvon Mo M,PT Acute Rehab Services [phone] (pager)

## 2020-08-07 NOTE — Progress Notes (Signed)
Progress Note  Patient Name: Samuel Barnes Date of Encounter: 08/07/2020  Primary Cardiologist:   Armanda Magic, MD   Subjective   No chest pain.   Breathing continues to improve but not at baseline.   Inpatient Medications    Scheduled Meds:  amiodarone  200 mg Oral BID   Followed by   Melene Muller ON 08/18/2020] amiodarone  200 mg Oral Daily   apixaban  5 mg Oral BID   FLUoxetine  20 mg Oral QHS   folic acid  1 mg Oral Daily   furosemide  80 mg Intravenous TID   losartan  50 mg Oral Daily   metoprolol succinate  100 mg Oral Daily   mometasone-formoterol  2 puff Inhalation BID   montelukast  10 mg Oral Daily   multivitamin with minerals  1 tablet Oral Daily   potassium chloride  20 mEq Oral BID   pravastatin  20 mg Oral q1800   senna-docusate  1 tablet Oral BID   terazosin  5 mg Oral Daily   thiamine  100 mg Oral Daily   Continuous Infusions:  PRN Meds: acetaminophen **OR** acetaminophen, bisacodyl, ipratropium-albuterol, lidocaine, polyethylene glycol, prochlorperazine   Vital Signs    Vitals:   08/06/20 2054 08/07/20 0158 08/07/20 0519 08/07/20 0759  BP: 117/75  113/76   Pulse: 75  77 75  Resp: 20  14 20   Temp: 99.1 F (37.3 C)  97.9 F (36.6 C)   TempSrc: Oral  Oral   SpO2: 90%  96% 93%  Weight:  94 kg    Height:        Intake/Output Summary (Last 24 hours) at 08/07/2020 1049 Last data filed at 08/06/2020 2058 Gross per 24 hour  Intake 360 ml  Output 1600 ml  Net -1240 ml   Filed Weights   08/05/20 0530 08/06/20 0350 08/07/20 0158  Weight: 94 kg 94 kg 94 kg    Telemetry    Atrial fib with controlled ventricular rate - Personally Reviewed  ECG    NA - Personally Reviewed  Physical Exam   GEN: No acute distress.   Neck: No  JVD Cardiac: Irregular RR, no murmurs, rubs, or gallops.  Respiratory: Clear  to auscultation bilaterally. GI: Soft, nontender, non-distended  MS: No  edema; No deformity. Neuro:  Nonfocal  Psych: Normal affect    Labs    Chemistry Recent Labs  Lab 07/31/20 1734 08/01/20 0042 08/05/20 0026 08/06/20 0106 08/07/20 0214  NA 129*   < > 129* 129* 128*  K 3.9   < > 3.8 3.8 3.9  CL 89*   < > 88* 89* 90*  CO2 31   < > 32 28 28  GLUCOSE 100*   < > 148* 147* 146*  BUN 11   < > 18 18 21   CREATININE 0.60*   < > 0.80 0.70 0.65  CALCIUM 9.2   < > 9.2 9.1 9.0  PROT 6.4*  --   --   --   --   GFRNONAA >60   < > >60 >60 >60  ANIONGAP 9   < > 9 12 10    < > = values in this interval not displayed.     Hematology Recent Labs  Lab 08/02/20 0225 08/03/20 0208 08/04/20 0249  WBC 8.0 9.3 8.3  RBC 4.30 4.44 4.53  HGB 14.0 14.4 14.5  HCT 41.6 42.8 44.1  MCV 96.7 96.4 97.4  MCH 32.6 32.4 32.0  MCHC 33.7 33.6 32.9  RDW 13.1 13.1 13.3  PLT 178 213 235    Cardiac EnzymesNo results for input(s): TROPONINI in the last 168 hours. No results for input(s): TROPIPOC in the last 168 hours.   BNP Recent Labs  Lab 08/06/20 0329  BNP 446.7*     DDimer No results for input(s): DDIMER in the last 168 hours.   Radiology    No results found.  Cardiac Studies   ECHO:  TEE  1. Left ventricular ejection fraction, by estimation, is 55 to 60%. The  left ventricle has normal function.   2. Right ventricular systolic function is normal. The right ventricular  size is normal.   3. Left atrial size was mild to moderately dilated. No left atrial/left  atrial appendage thrombus was detected.   4. Right atrial size was moderately dilated.   5. The mitral valve is normal in structure. Mild mitral valve  regurgitation. No evidence of mitral stenosis.   6. The aortic valve is tricuspid. There is mild calcification of the  aortic valve. There is mild thickening of the aortic valve. Aortic valve  regurgitation is not visualized. Mild aortic valve sclerosis is present,  with no evidence of aortic valve  stenosis.   7. There is Moderate (Grade III) plaque.   8. Successful DCCV performed (200J x1) after TEE with  return to NSR.  Please see separate note.   Patient Profile   70 y.o. male with a PMH of coronary artery calcifications, HTN, HLD, BPH s/p TURP, depression, and tobacco abuse,  who is being followed by cardiology for the evaluation of new onset atrial fibrillation  Assessment & Plan    Hypoxic Respiratory Failure/HFpEF  Status post thoracentesis.   Net negative I/O is 9 liters.  Change to PO Lasix  HTN BP is OK.  No change in therapy.   AF CHASVASC 4 Status post DCCV.  He was briefly in NSR but went back into atrial fib.  Now with rate control.  On amiodarone.   Suggested out patient sleep study.  Continue anticoagulation and possible repeat out patient cardioversion in the future.   Coronary artery calcifications:  Needs primary risk reduction.     Hyponatremia:   Na is stable.  Restrict free water intake. Was 124 on admission.   Tobacco abuse:  Educated.   ETOH abuse:  Discussed and emphasized cessation.     For questions or updates, please contact [facility] Please consult [email or web address] for contact info under Cardiology/STEMI.   Signed, Rollene Rotunda, MD  08/07/2020, 10:49 AM

## 2020-08-08 ENCOUNTER — Other Ambulatory Visit (HOSPITAL_COMMUNITY): Payer: Self-pay

## 2020-08-08 DIAGNOSIS — J9601 Acute respiratory failure with hypoxia: Secondary | ICD-10-CM | POA: Diagnosis not present

## 2020-08-08 LAB — BASIC METABOLIC PANEL
Anion gap: 12 (ref 5–15)
BUN: 23 mg/dL (ref 8–23)
CO2: 28 mmol/L (ref 22–32)
Calcium: 9.1 mg/dL (ref 8.9–10.3)
Chloride: 88 mmol/L — ABNORMAL LOW (ref 98–111)
Creatinine, Ser: 0.64 mg/dL (ref 0.61–1.24)
GFR, Estimated: >60 mL/min (ref >60)
Glucose, Bld: 101 mg/dL — ABNORMAL HIGH (ref 70–99)
Potassium: 4.8 mmol/L (ref 3.5–5.1)
Sodium: 128 mmol/L — ABNORMAL LOW (ref 135–145)

## 2020-08-08 LAB — MAGNESIUM: Magnesium: 2.1 mg/dL (ref 1.7–2.4)

## 2020-08-08 MED ORDER — ALBUTEROL SULFATE HFA 108 (90 BASE) MCG/ACT IN AERS
2.0000 | INHALATION_SPRAY | RESPIRATORY_TRACT | 0 refills | Status: DC | PRN
  Filled 2020-08-08: qty 8.5, 16d supply, fill #0

## 2020-08-08 MED ORDER — FUROSEMIDE 80 MG PO TABS
80.0000 mg | ORAL_TABLET | Freq: Two times a day (BID) | ORAL | 0 refills | Status: DC
  Filled 2020-08-08: qty 60, 30d supply, fill #0

## 2020-08-08 MED ORDER — MELATONIN 3 MG PO TABS
3.0000 mg | ORAL_TABLET | Freq: Every evening | ORAL | Status: DC | PRN
  Administered 2020-08-08 (×2): 3 mg via ORAL
  Filled 2020-08-08 (×2): qty 1

## 2020-08-08 MED ORDER — MOMETASONE FURO-FORMOTEROL FUM 100-5 MCG/ACT IN AERO
2.0000 | INHALATION_SPRAY | Freq: Two times a day (BID) | RESPIRATORY_TRACT | 0 refills | Status: DC
  Filled 2020-08-08: qty 13, 30d supply, fill #0

## 2020-08-08 MED ORDER — FOLIC ACID 1 MG PO TABS
1.0000 mg | ORAL_TABLET | Freq: Every day | ORAL | 0 refills | Status: AC
  Filled 2020-08-08: qty 30, 30d supply, fill #0

## 2020-08-08 MED ORDER — ADULT MULTIVITAMIN W/MINERALS CH: 1.0000 | ORAL_TABLET | Freq: Every day | ORAL | Status: AC

## 2020-08-08 MED ORDER — APIXABAN 5 MG PO TABS
5.0000 mg | ORAL_TABLET | Freq: Two times a day (BID) | ORAL | 0 refills | Status: DC
  Filled 2020-08-08: qty 60, 30d supply, fill #0

## 2020-08-08 MED ORDER — LOSARTAN POTASSIUM 50 MG PO TABS
50.0000 mg | ORAL_TABLET | Freq: Every day | ORAL | 0 refills | Status: DC
  Filled 2020-08-08: qty 30, 30d supply, fill #0

## 2020-08-08 MED ORDER — THIAMINE HCL 100 MG PO TABS
100.0000 mg | ORAL_TABLET | Freq: Every day | ORAL | 0 refills | Status: DC
  Filled 2020-08-08: qty 30, 30d supply, fill #0

## 2020-08-08 MED ORDER — METOPROLOL SUCCINATE ER 100 MG PO TB24
100.0000 mg | ORAL_TABLET | Freq: Every day | ORAL | 0 refills | Status: DC
  Filled 2020-08-08: qty 30, 30d supply, fill #0

## 2020-08-08 MED ORDER — AMIODARONE HCL 200 MG PO TABS
ORAL_TABLET | ORAL | 0 refills | Status: DC
  Filled 2020-08-08: qty 30, 21d supply, fill #0

## 2020-08-08 MED ORDER — POTASSIUM CHLORIDE CRYS ER 20 MEQ PO TBCR
20.0000 meq | EXTENDED_RELEASE_TABLET | Freq: Two times a day (BID) | ORAL | 0 refills | Status: DC
  Filled 2020-08-08: qty 30, 15d supply, fill #0

## 2020-08-08 MED ORDER — IPRATROPIUM-ALBUTEROL 0.5-2.5 (3) MG/3ML IN SOLN
3.0000 mL | Freq: Four times a day (QID) | RESPIRATORY_TRACT | 0 refills | Status: DC | PRN
  Filled 2020-08-08: qty 360, 30d supply, fill #0

## 2020-08-08 NOTE — TOC Progression Note (Addendum)
Transition of Care [facility]) - Progression Note    Patient Details  Name: Samuel Barnes MRN: [id number] Date of Birth: 1950/05/11  [facility] of [facility] CM/SW Contact  Lorri Frederick, LCSW Phone Number: 08/08/2020, 11:12 AM  Clinical Narrative:   CSW spoke with pt regarding home O2.  Pt aware of need and agreeable to [employer] to provide.  DME order in, O2 note in, referral made to [employer].    CSW confirmed with [facility] that they will provide [facility] for pt at DC.      Expected Discharge Plan: Home w Home Health Services Barriers to Discharge: Continued Medical Work up  Expected Discharge Plan and Services Expected Discharge Plan: Home w Home Health Services       Living arrangements for the past 2 months: Single Family Home                 DME Arranged: Oxygen DME Agency: [employer] Date DME Agency Contacted: 08/08/20 Time DME Agency Contacted: 1107 Representative spoke with at DME Agency: Velna Hatchet             Social Determinants of Health (SDOH) Interventions    Readmission Risk Interventions No flowsheet data found.

## 2020-08-08 NOTE — Discharge Summary (Signed)
Physician Discharge Summary  AKUL LEGGETTE ZOX:[id number] DOB: April 14, 1950 DOA: 07/28/2020  PCP: Oneita Hurt, No  Admit date: 07/28/2020 Discharge date: 08/08/2020  Admitted From: Home Disposition: Home  Recommendations for Outpatient Follow-up:  Follow up with PCP in 1 week with repeat CBC/BMP Outpatient follow-up with cardiology Recommend outpatient evaluation and follow-up by pulmonary Follow up in ED if symptoms worsen or new appear   Home Health: Home health PT Equipment/Devices: Oxygen via nasal cannula at 3 L/min  Discharge Condition: Stable CODE STATUS: Full Diet recommendation: Heart healthy/fluid restriction of up to 1500 cc a day  Brief/Interim Summary: 70 y.o. male with a history of aortic atherosclerosis, BPH, obesity, coronary artery calcification, depression, hyperlipidemia, hypertension, alcohol and tobacco use presented with worsening shortness of breath.  He was found to have acute diastolic heart failure, paroxysmal new onset atrial fibrillation and pleural effusion requiring oxygen supplementation.  He was started on IV Lasix and heparin.  Cardiology was consulted.  He also underwent right-sided thoracentesis.  He underwent TEE/DCCV by cardiology on 08/02/2020.  He has had good diuresis with IV Lasix.  He has been switched to oral Lasix by cardiology.  He will be discharged today on oral Lasix.  Cardiology has cleared him for discharge.  Outpatient follow-up with PCP/cardiology.  Recommend outpatient evaluation and follow-up by pulmonary.  Discharge Diagnoses:   Acute hypoxic respiratory failure -Possibly from heart failure exacerbation and pleural effusion -Required up to 8 L oxygen during this hospitalization.   -Still on 2.5-3L oxygen via nasal cannula.   -His oxygen saturations dropped to the 80s on ambulation and required 3 L oxygen via nasal cannula supplementation.  He will be discharged home on 3 L oxygen via nasal cannula.     Acute probable diastolic heart  failure -Echo showed normal EF however LV diastolic dysfunction could not be evaluated -Cardiology following.  Patient has had good diuresis with IV Lasix.  Lasix has been changed to oral Lasix on 08/07/2020 by cardiology.  Continue diet and fluid restriction.  Continue metoprolol and losartan.  Negative balance of 8838.1 cc since admission -Cardiology has cleared him for discharge.  Discharge home today on oral Lasix.  Outpatient follow-up with cardiology.  Paroxysmal A. fib, new onset -Currently rate controlled.  He has been switched to Eliquis by cardiology. -Status post TEE/DCCV on 08/02/2020.  Continue metoprolol.  Cardiology has started him on oral amiodarone.  Outpatient follow-up.   Possible COPD -Patient possibly has underlying undiagnosed COPD.  Continue current inhalers on discharge as well.  Evaluation and follow-up by pulmonary as an outpatient.   Hyponatremia -Possibly from fluid overload.  Sodium 124 on admission. Still 128 today.  Outpatient follow-up  Hypertension -Blood pressure stable.  Continue metoprolol, Lasix and losartan.  Amlodipine discontinued.  Outpatient follow-up.  Coronary artery calcification Hyperlipidemia -Continue statin.   BPH -Continue terazosin   Alcohol use -No signs of withdrawal.  Continue thiamine, multivitamin and folic acid on discharge.  Tobacco use -Smoking cessation discussed multiple number of times.  Patient declined nicotine replacement therapy. -Outpatient follow-up.    Fatty liver -Seen on imaging.  Likely related to alcohol consumption.  Outpatient follow-up   Generalized deconditioning -PT recommends home health PT   Depression -Continue Zoloft   Hyperglycemia/prediabetes -A1c 5.7.  Outpatient follow-up.    Discharge Instructions  Discharge Instructions     Amb referral to [facility]   Complete by: As directed       Allergies as of 08/08/2020  Reactions   Ciprofloxacin Shortness Of Breath, Other (See  Comments)   Numbness in extremities   Gabapentin Other (See Comments)   Mental problems, depression, anger        Medication List     STOP taking these medications    amLODipine 10 MG tablet Commonly known as: NORVASC   diphenhydrAMINE 25 MG tablet Commonly known as: SOMINEX       TAKE these medications    albuterol 108 (90 Base) MCG/ACT inhaler Commonly known as: VENTOLIN HFA Inhale 2 puffs into the lungs every 4 (four) hours as needed for wheezing or shortness of breath.   amiodarone 200 MG tablet Commonly known as: PACERONE 200 mg twice daily till 08/17/2020 and then 200 mg daily from 08/18/2020 onwards as per cardiology   apixaban 5 MG Tabs tablet Commonly known as: ELIQUIS Take 1 tablet (5 mg total) by mouth 2 (two) times daily.   FLUoxetine 20 MG capsule Commonly known as: PROZAC Take 20 mg by mouth at bedtime.   folic acid 1 MG tablet Commonly known as: FOLVITE Take 1 tablet (1 mg total) by mouth daily. Start taking on: August 09, 2020   furosemide 80 MG tablet Commonly known as: LASIX Take 1 tablet (80 mg total) by mouth 2 (two) times daily.   ipratropium-albuterol 0.5-2.5 (3) MG/3ML Soln Commonly known as: DUONEB Take 3 mLs by nebulization every 6 (six) hours as needed.   losartan 50 MG tablet Commonly known as: COZAAR Take 1 tablet (50 mg total) by mouth daily. Start taking on: August 09, 2020   lovastatin 20 MG tablet Commonly known as: MEVACOR Take 20 mg by mouth daily.   metoprolol succinate 100 MG 24 hr tablet Commonly known as: TOPROL-XL Take 1 tablet (100 mg total) by mouth daily. Take with or immediately following a meal. Start taking on: August 09, 2020 What changed:  medication strength how much to take additional instructions   mometasone-formoterol 100-5 MCG/ACT Aero Commonly known as: DULERA Inhale 2 puffs into the lungs 2 (two) times daily.   montelukast 10 MG tablet Commonly known as: SINGULAIR Take 10 mg by mouth  daily.   multivitamin with minerals Tabs tablet Take 1 tablet by mouth daily. Start taking on: August 09, 2020   potassium chloride SA 20 MEQ tablet Commonly known as: KLOR-CON Take 1 tablet (20 mEq total) by mouth 2 (two) times daily.   terazosin 5 MG capsule Commonly known as: HYTRIN Take 5 mg by mouth daily.   thiamine 100 MG tablet Take 1 tablet (100 mg total) by mouth daily. Start taking on: August 09, 2020               Durable Medical Equipment  (From admission, onward)           Start     Ordered   08/08/20 1058  For home use only DME oxygen  Once       Question Answer Comment  Length of Need 6 Months   Mode or (Route) Nasal cannula   Liters per Minute 3   Frequency Continuous (stationary and portable oxygen unit needed)   Oxygen conserving device Yes   Oxygen delivery system Gas      08/08/20 1057   08/08/20 0000  For home use only DME Nebulizer machine       Question Answer Comment  Patient needs a nebulizer to treat with the following condition COPD (chronic obstructive pulmonary disease) (HCC)   Length of Need Lifetime  08/08/20 1203              Follow-up Information     [name], MD Follow up on 08/21/2020.   Specialty: Cardiology Why: Please arrive 15 minutes early for your 1:30pm post-hospital cardiology appointment Contact information: [address] [place] [address] [phone]         [name], MD. Schedule an appointment as soon as possible for a visit in 1 week(s).   Specialty: Cardiology Why: With repeat CBC/BMP Contact information: [address] [place] [address] [phone]                Allergies  Allergen Reactions   Ciprofloxacin Shortness Of Breath and Other (See Comments)    Numbness in extremities    Gabapentin Other (See Comments)    Mental problems, depression, anger    Consultations: Cardiology   Procedures/Studies: DG Chest 1  View  Result Date: 07/31/2020 CLINICAL DATA:  70 year old male status post right-sided thoracentesis. EXAM: CHEST  1 VIEW COMPARISON:  Chest radiograph dated 07/31/2020. FINDINGS: Slight interval decrease in the size of the right pleural effusion. Trace right pleural effusion and right lung base atelectasis/infiltrate remains. No pneumothorax. The left lung is clear. Stable cardiomegaly. Atherosclerotic calcification of the aortic arch. No acute osseous pathology. IMPRESSION: Slight interval decrease in the size of the right pleural effusion. No pneumothorax. Electronically Signed   By: Elgie Collard M.D.   On: 07/31/2020 16:37   DG Chest 2 View  Result Date: 08/03/2020 CLINICAL DATA:  Dyspnea. EXAM: CHEST - 2 VIEW COMPARISON:  August 01, 2020. FINDINGS: Mild cardiomegaly is noted. No pneumothorax is noted. Old right rib fractures are noted. Left lung is clear. Mild right pleural effusion is noted with associated right basilar atelectasis or infiltrate. IMPRESSION: Mild right pleural effusion is noted with associated right basilar atelectasis or infiltrate. Electronically Signed   By: Lupita Raider M.D.   On: 08/03/2020 13:18   CT Angio Chest PE W and/or Wo Contrast  Result Date: 07/28/2020 CLINICAL DATA:  Shortness of breath for 3 days.  Elevated D-dimer. EXAM: CT ANGIOGRAPHY CHEST WITH CONTRAST TECHNIQUE: Multidetector CT imaging of the chest was performed using the standard protocol during bolus administration of intravenous contrast. Multiplanar CT image reconstructions and MIPs were obtained to evaluate the vascular anatomy. CONTRAST:  OMNIPAQUE IOHEXOL 350 MG/ML SOLN COMPARISON:  CT chest, abdomen and pelvis 04/18/2017. Single-view of the chest today and PA and lateral chest 03/02/2017. FINDINGS: Cardiovascular: Satisfactory of opacification of the pulmonary arteries to the segmental level. No pulmonary embolus is seen. There is cardiomegaly. Small pericardial effusion is present. No aortic  aneurysm. Calcific aortic and coronary atherosclerosis. Mediastinum/Nodes: No enlarged mediastinal, hilar, or axillary lymph nodes. Thyroid gland, trachea, and esophagus demonstrate no significant findings. Lungs/Pleura: Moderate right and very small left pleural effusions are seen. There is compressive atelectasis in the right lower lobe due to the patient's effusion. No consolidative process is identified. No nodule or mass is seen. Upper Abdomen: The liver is low attenuating consistent with fatty infiltration. A simple cyst measuring 3.4 cm is identified. There is some thickening of the adrenal glands compatible with hyperplasia, unchanged. Left renal cysts are partially imaged. Musculoskeletal: No acute abnormality or focal lesion. Remote healed right rib fractures are noted. Review of the MIP images confirms the above findings. IMPRESSION: Negative for pulmonary embolus. Moderate right and small left pleural effusions with compressive atelectasis in the right  lower lobe. Cause for the effusions is not identified. Cardiomegaly. Small pericardial effusion. Calcific coronary artery disease. Fatty infiltration of the liver. Aortic Atherosclerosis (ICD10-I70.0). Electronically Signed   By: Drusilla Kanner M.D.   On: 07/28/2020 13:37   NM Myocar Multi W/Spect Izetta Dakin Motion / EF  Result Date: 07/30/2020 CLINICAL DATA:  Atrial fibrillation. EXAM: MYOCARDIAL IMAGING WITH SPECT (REST AND PHARMACOLOGIC-STRESS) GATED LEFT VENTRICULAR WALL MOTION STUDY LEFT VENTRICULAR EJECTION FRACTION TECHNIQUE: Standard myocardial SPECT imaging was performed after resting intravenous injection of 10.7 mCi Tc-21m tetrofosmin. Subsequently, intravenous infusion of Lexiscan was performed under the supervision of the Cardiology staff. At peak effect of the drug, 31.0 mCi Tc-68m tetrofosmin was injected intravenously and standard myocardial SPECT imaging was performed. Quantitative gated imaging was also performed to evaluate left  ventricular wall motion, and estimate left ventricular ejection fraction. COMPARISON:  None. FINDINGS: Perfusion: Fixed defect is seen involving inferior septal myocardium toward the apex. No definite reversibility is noted. Wall Motion: Normal left ventricular wall motion. No left ventricular dilation. Left Ventricular Ejection Fraction: 65 % End diastolic volume 86 ml End systolic volume 30 ml IMPRESSION: 1. 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 No definite evidence of reversibility is noted to suggest acute ischemia. 2. Normal left ventricular wall motion. 3. Left ventricular ejection fraction 65% 4. Non invasive risk stratification*: Low *2012 Appropriate Use Criteria for Coronary Revascularization Focused Update: J Am Coll Cardiol. 2012;59(9):857-881. http://content.dementiazones.com.aspx?articleid=1201161 Electronically Signed   By: Lupita Raider M.D.   On: 07/30/2020 14:18   DG CHEST PORT 1 VIEW  Result Date: 08/01/2020 CLINICAL DATA:  Shortness of breath.  Prior thoracentesis. EXAM: PORTABLE CHEST 1 VIEW COMPARISON:  07/31/2020. FINDINGS: Mediastinum hilar structures normal. Cardiomegaly with pulmonary venous congestion again noted. Low lung volumes with persistent bibasilar atelectasis. Small right pleural effusion, decreased in size after prior right thoracentesis. No pneumothorax. Old right rib fractures. IMPRESSION: 1. Cardiomegaly with pulmonary venous congestion again noted. Low lung volumes with bibasilar atelectasis again noted. 2. Small right pleural effusion, decreased in size after prior thoracentesis. No pneumothorax. Electronically Signed   By: Maisie Fus  Register   On: 08/01/2020 08:03   DG CHEST PORT 1 VIEW  Result Date: 07/31/2020 CLINICAL DATA:  Shortness of breath, right-sided chest pain, status post thoracentesis today EXAM: PORTABLE CHEST 1 VIEW COMPARISON:  07/31/2020 FINDINGS: Unchanged AP portable chest  radiograph. Cardiomegaly. Layering right pleural effusion with associated atelectasis or consolidation. No pneumothorax. No new airspace opacity. Redemonstrated chronic fracture deformities of the right ribs. IMPRESSION: 1.  Unchanged AP portable chest radiograph. 2. Layering right pleural effusion with associated atelectasis or consolidation. 3.  No pneumothorax. No new airspace opacity. 4.  Cardiomegaly. Electronically Signed   By: Lauralyn Primes M.D.   On: 07/31/2020 20:35   DG CHEST PORT 1 VIEW  Result Date: 07/31/2020 CLINICAL DATA:  Hypoxia and shortness breath EXAM: PORTABLE CHEST 1 VIEW COMPARISON:  CT chest 07/28/2020 FINDINGS: Hazy obscuration of the right lung base compatible with known right pleural effusion and right lower lobe atelectasis, likely with a component of right middle lobe atelectasis as shown on CT from 07/28/2020. Moderate cardiomegaly is present. Atherosclerotic calcification of the aortic arch. Old healed right rib fractures. The left lung remains clear. IMPRESSION: 1. Similar appearance of right pleural effusion and opacification of the right lung base due to atelectasis of the right lower lobe and part of the right middle lobe. 2. Moderate cardiomegaly. 3.  Aortic Atherosclerosis (  ICD10-I70.0). Electronically Signed   By: Gaylyn Rong M.D.   On: 07/31/2020 13:28   DG Chest Portable 1 View  Result Date: 07/28/2020 CLINICAL DATA:  cough, chills, hypoxia, shortness of breath. new afib EXAM: PORTABLE CHEST 1 VIEW COMPARISON:  04/09/2017 FINDINGS: Mild cardiomegaly. Cardiac silhouette has enlarged compared to the previous study. Atherosclerotic calcification of the aortic knob. Moderate-sized right-sided pleural effusion with hazy right basilar airspace opacity. No left-sided pleural effusion is seen. No pneumothorax. Multiple chronic right-sided rib fractures. IMPRESSION: 1. Moderate right-sided pleural effusion with hazy right basilar airspace opacity, which may reflect  atelectasis versus pneumonia. 2. Cardiac silhouette has enlarged compared to the previous study. Underlying pericardial effusion not excluded. Electronically Signed   By: Duanne Guess D.O.   On: 07/28/2020 11:17   ECHOCARDIOGRAM COMPLETE  Result Date: 07/30/2020    ECHOCARDIOGRAM REPORT   Patient Name:   LORETO LOESCHER Date of Exam: 07/29/2020 Medical Rec #:  [id number]             Height:       68.0 in Accession #:    [id number]            Weight:       231.3 lb Date of Birth:  September 12, 1950             BSA:          2.174 m Patient Age:    70 years              BP:           139/80 mmHg Patient Gender: M                     HR:           68 bpm. Exam Location:  Inpatient Procedure: 2D Echo, Cardiac Doppler, Color Doppler and Intracardiac            Opacification Agent Indications:    CHF                 Atrial fibrillation  History:        Patient has no prior history of Echocardiogram examinations.                 CAD; Risk Factors:Dyslipidemia, Hypertension and Current Smoker.                 ETOH use.  Sonographer:    Ross Ludwig RDCS (AE) Referring Phys: [id number] DAVID Kelby Fam ORTIZ  Sonographer Comments: Patient is morbidly obese and Technically difficult study due to poor echo windows. Image acquisition challenging due to patient body habitus. IMPRESSIONS  1. Left ventricular ejection fraction, by estimation, is 55 to 60%. The left ventricle has normal function. The left ventricle has no regional wall motion abnormalities. Left ventricular diastolic function could not be evaluated.  2. Prominent moderator band of no clinical significance. Right ventricular systolic function is mildly reduced. The right ventricular size is normal. There is mildly elevated pulmonary artery systolic pressure. The estimated right ventricular systolic pressure is 36.5 mmHg.  3. The mitral valve is normal in structure. No evidence of mitral valve regurgitation. No evidence of mitral stenosis.  4. The aortic valve is normal  in structure. Aortic valve regurgitation is not visualized. No aortic stenosis is present.  5. The inferior vena cava is dilated in size with <50% respiratory variability, suggesting right atrial pressure of 15 mmHg. FINDINGS  Left Ventricle: Left ventricular ejection  fraction, by estimation, is 55 to 60%. The left ventricle has normal function. The left ventricle has no regional wall motion abnormalities. Definity contrast agent was given IV to delineate the left ventricular  endocardial borders. The left ventricular internal cavity size was normal in size. There is no left ventricular hypertrophy. Left ventricular diastolic function could not be evaluated due to atrial fibrillation. Left ventricular diastolic function could  not be evaluated. Normal left ventricular filling pressure. Right Ventricle: Prominent moderator band of no clinical significance. The right ventricular size is normal. No increase in right ventricular wall thickness. Right ventricular systolic function is mildly reduced. There is mildly elevated pulmonary artery  systolic pressure. The tricuspid regurgitant velocity is 2.32 m/s, and with an assumed right atrial pressure of 15 mmHg, the estimated right ventricular systolic pressure is 36.5 mmHg. Left Atrium: Left atrial size was normal in size. Right Atrium: Right atrial size was normal in size. Pericardium: There is no evidence of pericardial effusion. Mitral Valve: The mitral valve is normal in structure. No evidence of mitral valve regurgitation. No evidence of mitral valve stenosis. Tricuspid Valve: The tricuspid valve is normal in structure. Tricuspid valve regurgitation is not demonstrated. No evidence of tricuspid stenosis. Aortic Valve: The aortic valve is normal in structure. Aortic valve regurgitation is not visualized. No aortic stenosis is present. Aortic valve mean gradient measures 2.0 mmHg. Aortic valve peak gradient measures 3.7 mmHg. Aortic valve area, by VTI measures 2.22  cm. Pulmonic Valve: The pulmonic valve was normal in structure. Pulmonic valve regurgitation is not visualized. No evidence of pulmonic stenosis. Aorta: The aortic root is normal in size and structure. Venous: The inferior vena cava is dilated in size with less than 50% respiratory variability, suggesting right atrial pressure of 15 mmHg. IAS/Shunts: No atrial level shunt detected by color flow Doppler.  LEFT VENTRICLE PLAX 2D LVIDd:         4.40 cm  Diastology LVIDs:         4.00 cm  LV e' medial:    11.40 cm/s LV PW:         1.20 cm  LV E/e' medial:  8.2 LV IVS:        0.80 cm  LV e' lateral:   11.30 cm/s LVOT diam:     2.00 cm  LV E/e' lateral: 8.3 LV SV:         45 LV SV Index:   21 LVOT Area:     3.14 cm  RIGHT VENTRICLE          IVC RV Basal diam:  3.40 cm  IVC diam: 3.20 cm LEFT ATRIUM             Index       RIGHT ATRIUM           Index LA diam:        5.30 cm 2.44 cm/m  RA Area:     24.00 cm LA Vol (A2C):   89.3 ml 41.08 ml/m RA Volume:   68.80 ml  31.65 ml/m LA Vol (A4C):   63.1 ml 29.03 ml/m LA Biplane Vol: 77.9 ml 35.83 ml/m  AORTIC VALVE AV Area (Vmax):    2.13 cm AV Area (Vmean):   2.22 cm AV Area (VTI):     2.22 cm AV Vmax:           96.40 cm/s AV Vmean:          62.000 cm/s AV VTI:  0.202 m AV Peak Grad:      3.7 mmHg AV Mean Grad:      2.0 mmHg LVOT Vmax:         65.50 cm/s LVOT Vmean:        43.900 cm/s LVOT VTI:          0.143 m LVOT/AV VTI ratio: 0.71  AORTA Ao Root diam: 3.30 cm MITRAL VALVE               TRICUSPID VALVE MV Area (PHT): 3.53 cm    TR Peak grad:   21.5 mmHg MV Decel Time: 215 msec    TR Vmax:        232.00 cm/s MV E velocity: 93.80 cm/s                            SHUNTS                            Systemic VTI:  0.14 m                            Systemic Diam: 2.00 cm Armanda Magic MD Electronically signed by Armanda Magic MD Signature Date/Time: 07/30/2020/9:43:47 AM    Final    ECHO TEE  Result Date: 08/02/2020    TRANSESOPHOGEAL ECHO REPORT   Patient Name:    WARDELL POKORSKI Date of Exam: 08/02/2020 Medical Rec #:  [id number]             Height:       68.0 in Accession #:    [id number]            Weight:       215.2 lb Date of Birth:  [birth year]             BSA:          2.108 m Patient Age:    70 years              BP:           144/77 mmHg Patient Gender: M                     HR:           48 bpm. Exam Location:  Inpatient Procedure: Transesophageal Echo, 3D Echo, Cardiac Doppler and Color Doppler Indications:     I48.1 Persistent atrial fibrillation  History:         Patient has prior history of Echocardiogram examinations, most                  recent 07/30/2020. Risk Factors:Hypertension, Dyslipidemia and                  Current Smoker. Acute hypoxic respiratory failure likely 2/2                  acute CHF. ETOH Abuse.  Sonographer:     Leta Jungling RDCS Referring Phys:  [id number] Beatriz Stallion Diagnosing Phys: Laurance Flatten MD PROCEDURE: After discussion of the risks and benefits of a TEE, an informed consent was obtained from the patient. The transesophogeal probe was passed without difficulty through the esophogus of the patient. Imaged were obtained with the patient in a left lateral decubitus position. Sedation performed by different physician. The patient was monitored while under deep  sedation. Anesthestetic sedation was provided intravenously by Anesthesiology: 225.92mg  of Propofol, 30mg  of Lidocaine. Image quality was good. The patient's vital signs; including heart rate, blood pressure, and oxygen saturation; remained stable throughout the procedure. The patient developed no complications during the procedure. IMPRESSIONS  1. Left ventricular ejection fraction, by estimation, is 55 to 60%. The left ventricle has normal function.  2. Right ventricular systolic function is normal. The right ventricular size is normal.  3. Left atrial size was mild to moderately dilated. No left atrial/left atrial appendage thrombus was detected.  4. Right  atrial size was moderately dilated.  5. The mitral valve is normal in structure. Mild mitral valve regurgitation. No evidence of mitral stenosis.  6. The aortic valve is tricuspid. There is mild calcification of the aortic valve. There is mild thickening of the aortic valve. Aortic valve regurgitation is not visualized. Mild aortic valve sclerosis is present, with no evidence of aortic valve stenosis.  7. There is Moderate (Grade III) plaque.  8. Successful DCCV performed (200J x1) after TEE with return to NSR. Please see separate note. FINDINGS  Left Ventricle: Left ventricular ejection fraction, by estimation, is 55 to 60%. The left ventricle has normal function. The left ventricular internal cavity size was normal in size. Right Ventricle: The right ventricular size is normal. No increase in right ventricular wall thickness. Right ventricular systolic function is normal. Left Atrium: Left atrial size was mild to moderately dilated. No left atrial/left atrial appendage thrombus was detected. Right Atrium: Right atrial size was moderately dilated. Pericardium: There is no evidence of pericardial effusion. Mitral Valve: The mitral valve is normal in structure. There is mild thickening of the mitral valve leaflet(s). There is mild calcification of the mitral valve leaflet(s). Mild mitral valve regurgitation. No evidence of mitral valve stenosis. Tricuspid Valve: The tricuspid valve is normal in structure. Tricuspid valve regurgitation is mild. Aortic Valve: The aortic valve is tricuspid. There is mild calcification of the aortic valve. There is mild thickening of the aortic valve. Aortic valve regurgitation is not visualized. Mild aortic valve sclerosis is present, with no evidence of aortic valve stenosis. Pulmonic Valve: The pulmonic valve was normal in structure. Pulmonic valve regurgitation is trivial. Aorta: The aortic root and ascending aorta are structurally normal, with no evidence of dilitation. There is  moderate (Grade III) plaque. IAS/Shunts: No atrial level shunt detected by color flow Doppler.  LEFT VENTRICLE PLAX 2D LVOT diam:     2.00 cm LVOT Area:     3.14 cm   AORTA Ao Root diam: 3.20 cm Ao Asc diam:  3.50 cm TRICUSPID VALVE TR Peak grad:   16.8 mmHg TR Vmax:        205.00 cm/s  SHUNTS Systemic Diam: 2.00 cm MD Electronically signed by Laurance Flatten MD Signature Date/Time: 08/02/2020/4:16:15 PM    Final    IR THORACENTESIS ASP PLEURAL SPACE W/IMG GUIDE  Result Date: 07/31/2020 INDICATION: Patient presents today with shortness of breath and right pleural effusion. Interventional radiology asked to perform a diagnostic and therapeutic thoracentesis. EXAM: ULTRASOUND GUIDED THORACENTESIS MEDICATIONS: 1% lidocaine 10 mL COMPLICATIONS: None immediate. PROCEDURE: An ultrasound guided thoracentesis was thoroughly discussed with the patient and questions answered. The benefits, risks, alternatives and complications were also discussed. The patient understands and wishes to proceed with the procedure. Written consent was obtained. Ultrasound was performed to localize and mark an adequate pocket of fluid in the right chest. The area was then prepped and draped in the  normal sterile fashion. 1% Lidocaine was used for local anesthesia. Under ultrasound guidance a 6 Fr Safe-T-Centesis catheter was introduced. Thoracentesis was performed. The catheter was removed and a dressing applied. FINDINGS: A total of approximately 1.5 L of amber-colored fluid was removed. Samples were sent to the laboratory as requested by the clinical team. IMPRESSION: Successful ultrasound guided right thoracentesis yielding 1.5 L of pleural fluid. Read by: Alwyn Ren, NP Electronically Signed   By: Marliss Coots MD   On: 07/31/2020 16:35      Subjective: Patient seen and examined at bedside.  Still feels short of breath with exertion but feels okay to go home today.  Denies any chest pain, nausea or  vomiting.  Discharge Exam: Vitals:   08/08/20 0750 08/08/20 1101  BP:    Pulse:    Resp:    Temp:    SpO2: 96% 94%    General: Pt is alert, awake, not in acute distress.  Currently on 2.5 to 3 L oxygen via nasal cannula. Cardiovascular: rate controlled, S1/S2 + Respiratory: bilateral decreased breath sounds at bases with some scattered crackles Abdominal: Soft, NT, ND, bowel sounds + Extremities: Trace lower extremity edema; no cyanosis    The results of significant diagnostics from this hospitalization (including imaging, microbiology, ancillary and laboratory) are listed below for reference.     Microbiology: Recent Results (from the past 240 hour(s))  Body fluid culture w Gram Stain     Status: None   Collection Time: 07/31/20  4:16 PM   Specimen: Lung, Right; Pleural Fluid  Result Value Ref Range Status   Specimen Description PLEURAL FLUID  Final   Special Requests LUNG RIGHT  Final   Gram Stain   Final    FEW WBC PRESENT, PREDOMINANTLY MONONUCLEAR NO ORGANISMS SEEN    Culture   Final    NO GROWTH Performed at [facility], [address] [place], [place] [address]    Report Status 08/03/2020 FINAL  Final     Labs: BNP (last 3 results) Recent Labs    07/28/20 1051 07/31/20 1037 08/06/20 0329  BNP 302.6* 245.9* 446.7*   Basic Metabolic Panel: Recent Labs  Lab 08/04/20 0249 08/05/20 0026 08/06/20 0106 08/07/20 0214 08/08/20 0252  NA 129* 129* 129* 128* 128*  K 3.6 3.8 3.8 3.9 4.8  CL 87* 88* 89* 90* 88*  CO2 31 32 GLUCOSE 120* 148* 147* 146* 101*  BUN CREATININE 0.70 0.80 0.70 0.65 0.64  CALCIUM 9.3 9.2 9.1 9.0 9.1  MG 2.0 1.8 1.8 1.9 2.1   Liver Function Tests: No results for input(s): AST, ALT, ALKPHOS, BILITOT, PROT, ALBUMIN in the last 168 hours. No results for input(s): LIPASE, AMYLASE in the last 168 hours. No results for input(s): AMMONIA in the last 168 hours. CBC: Recent Labs  Lab 08/02/20 0225  08/03/20 0208 08/04/20 0249  WBC 8.0 9.3 8.3  HGB 14.0 14.4 14.5  HCT 41.6 42.8 44.1  MCV 96.7 96.4 97.4  PLT 178 213 235   Cardiac Enzymes: No results for input(s): CKTOTAL, CKMB, CKMBINDEX, TROPONINI in the last 168 hours. BNP: Invalid input(s): POCBNP CBG: No results for input(s): GLUCAP in the last 168 hours. D-Dimer No results for input(s): DDIMER in the last 72 hours. Hgb A1c No results for input(s): HGBA1C in the last 72 hours. Lipid Profile No results for input(s): CHOL, HDL, LDLCALC, TRIG, CHOLHDL, LDLDIRECT in the last 72 hours. Thyroid function  studies No results for input(s): TSH, T4TOTAL, T3FREE, THYROIDAB in the last 72 hours.  Invalid input(s): FREET3 Anemia work up No results for input(s): VITAMINB12, FOLATE, FERRITIN, TIBC, IRON, RETICCTPCT in the last 72 hours. Urinalysis No results found for: COLORURINE, APPEARANCEUR, LABSPEC, PHURINE, GLUCOSEU, HGBUR, BILIRUBINUR, KETONESUR, PROTEINUR, UROBILINOGEN, NITRITE, LEUKOCYTESUR Sepsis Labs Invalid input(s): PROCALCITONIN,  WBC,  LACTICIDVEN Microbiology Recent Results (from the past 240 hour(s))  Body fluid culture w Gram Stain     Status: None   Collection Time: 07/31/20  4:16 PM   Specimen: Lung, Right; Pleural Fluid  Result Value Ref Range Status   Specimen Description PLEURAL FLUID  Final   Special Requests LUNG RIGHT  Final   Gram Stain   Final    FEW WBC PRESENT, PREDOMINANTLY MONONUCLEAR NO ORGANISMS SEEN    Culture   Final    NO GROWTH Performed at [facility], [address], [place], [place] [address]    Report Status 08/03/2020 FINAL  Final     Time coordinating discharge: 35 minutes  SIGNED:   Glade Lloyd, MD  [facility] 08/08/2020, 11:41 AM

## 2020-08-08 NOTE — Plan of Care (Signed)
Discharge education completed with pt. Pt discharged to home with daughter via daughter's personal vehicle. Pt home O2 delivered to room prior to discharge. Pt's home medications delivered to room [facility] pharmacy) prior to pt discharge. Pt safety maintained.   Problem: Education: Goal: Ability to demonstrate management of disease process will improve Outcome: Adequate for Discharge   Problem: Activity: Goal: Capacity to carry out activities will improve Outcome: Adequate for Discharge   Problem: Cardiac: Goal: Ability to achieve and maintain adequate cardiopulmonary perfusion will improve Outcome: Adequate for Discharge   Problem: Education: Goal: Knowledge of disease or condition will improve Outcome: Adequate for Discharge Goal: Understanding of medication regimen will improve Outcome: Adequate for Discharge Goal: Individualized Educational Video(s) Outcome: Adequate for Discharge   Problem: Activity: Goal: Ability to tolerate increased activity will improve Outcome: Adequate for Discharge   Problem: Cardiac: Goal: Ability to achieve and maintain adequate cardiopulmonary perfusion will improve Outcome: Adequate for Discharge   Problem: Health Behavior/Discharge Planning: Goal: Ability to safely manage health-related needs after discharge will improve Outcome: Adequate for Discharge   Problem: Education: Goal: Knowledge of General Education information will improve Description: Including pain rating scale, medication(s)/side effects and non-pharmacologic comfort measures Outcome: Adequate for Discharge   Problem: Health Behavior/Discharge Planning: Goal: Ability to manage health-related needs will improve Outcome: Adequate for Discharge   Problem: Clinical Measurements: Goal: Ability to maintain clinical measurements within normal limits will improve Outcome: Adequate for Discharge Goal: Will remain free from infection Outcome: Adequate for Discharge Goal: Diagnostic  test results will improve Outcome: Adequate for Discharge Goal: Respiratory complications will improve Outcome: Adequate for Discharge Goal: Cardiovascular complication will be avoided Outcome: Adequate for Discharge   Problem: Activity: Goal: Risk for activity intolerance will decrease Outcome: Adequate for Discharge   Problem: Nutrition: Goal: Adequate nutrition will be maintained Outcome: Adequate for Discharge   Problem: Coping: Goal: Level of anxiety will decrease Outcome: Adequate for Discharge   Problem: Elimination: Goal: Will not experience complications related to bowel motility Outcome: Adequate for Discharge Goal: Will not experience complications related to urinary retention Outcome: Adequate for Discharge   Problem: Pain Managment: Goal: General experience of comfort will improve Outcome: Adequate for Discharge   Problem: Safety: Goal: Ability to remain free from injury will improve Outcome: Adequate for Discharge   Problem: Skin Integrity: Goal: Risk for impaired skin integrity will decrease Outcome: Adequate for Discharge

## 2020-08-08 NOTE — TOC Transition Note (Signed)
Transition of Care [facility]) - CM/SW Discharge Note   Patient Details  Name: Samuel Barnes MRN: [id number] Date of Birth: [birth year]  Transition of Care [facility]) CM/SW Contact:  Lorri Frederick, LCSW Phone Number: 08/08/2020, 2:35 PM   Clinical Narrative:  Pt discharging today with [facility].  [employer] provides home O2.  Pt daughter will transport home.  No other needs identified.       Final next level of care: Home w Home Health Services Barriers to Discharge: Barriers Resolved   Patient Goals and CMS Choice Patient states their goals for this hospitalization and ongoing recovery are:: Wants to wean off of oxygen   Choice offered to / list presented to : Patient  Discharge Placement                       Discharge Plan and Services                DME Arranged: Oxygen DME Agency: [employer] Date DME Agency Contacted: 08/08/20 Time DME Agency Contacted: 1107 Representative spoke with at DME Agency: Velna Hatchet HH Arranged: PT HH Agency: [facility] Date [facility] Agency Contacted: 08/08/20 Time HH Agency Contacted: 1122 Representative spoke with at [facility] Agency: Kandee Keen  Social Determinants of Health (SDOH) Interventions     Readmission Risk Interventions No flowsheet data found.

## 2020-08-08 NOTE — Progress Notes (Signed)
Progress Note  Patient Name: Samuel Barnes Date of Encounter: 08/08/2020  Primary Cardiologist:   Armanda Magic, MD   Subjective   No chest pain.    Breathing is better but not at baseline.   Inpatient Medications    Scheduled Meds:  amiodarone  200 mg Oral BID   Followed by   Melene Muller ON 08/18/2020] amiodarone  200 mg Oral Daily   apixaban  5 mg Oral BID   FLUoxetine  20 mg Oral QHS   folic acid  1 mg Oral Daily   furosemide  80 mg Oral BID   losartan  50 mg Oral Daily   metoprolol succinate  100 mg Oral Daily   mometasone-formoterol  2 puff Inhalation BID   montelukast  10 mg Oral Daily   multivitamin with minerals  1 tablet Oral Daily   potassium chloride  20 mEq Oral BID   pravastatin  20 mg Oral q1800   senna-docusate  1 tablet Oral BID   terazosin  5 mg Oral Daily   thiamine  100 mg Oral Daily   Continuous Infusions:  PRN Meds: acetaminophen **OR** acetaminophen, bisacodyl, ipratropium-albuterol, lidocaine, melatonin, polyethylene glycol, prochlorperazine   Vital Signs    Vitals:   08/08/20 0132 08/08/20 0412 08/08/20 0750 08/08/20 1101  BP:  112/66    Pulse:  70    Resp:      Temp:  98.4 F (36.9 C)    TempSrc:  Oral    SpO2:  96% 96% 94%  Weight: 94 kg     Height:        Intake/Output Summary (Last 24 hours) at 08/08/2020 1143 Last data filed at 08/08/2020 0900 Gross per 24 hour  Intake 480 ml  Output 425 ml  Net 55 ml   Filed Weights   08/06/20 0350 08/07/20 0158 08/08/20 0132  Weight: 94 kg 94 kg 94 kg    Telemetry    Atrial fib with controlled rate- Personally Reviewed  ECG    NA - Personally Reviewed  Physical Exam   GEN: No  acute distress.   Neck: No  JVD Cardiac: Irregular RR, no murmurs, rubs, or gallops.  Respiratory:    Decreased breath sounds bilaterally.   GI: Soft, nontender, non-distended, normal bowel sounds  MS:  No edema; No deformity. Neuro:   Nonfocal  Psych: Oriented and appropriate    Labs     Chemistry Recent Labs  Lab 08/06/20 0106 08/07/20 0214 08/08/20 0252  NA 129* 128* 128*  K 3.8 3.9 4.8  CL 89* 90* 88*  CO2 28 28 28   GLUCOSE 147* 146* 101*  BUN 18 21 23   CREATININE 0.70 0.65 0.64  CALCIUM 9.1 9.0 9.1  GFRNONAA >60 >60 >60  ANIONGAP 12 10 12      Hematology Recent Labs  Lab 08/02/20 0225 08/03/20 0208 08/04/20 0249  WBC 8.0 9.3 8.3  RBC 4.30 4.44 4.53  HGB 14.0 14.4 14.5  HCT 41.6 42.8 44.1  MCV 96.7 96.4 97.4  MCH 32.6 32.4 32.0  MCHC 33.7 33.6 32.9  RDW 13.1 13.1 13.3  PLT 178 213 235    Cardiac EnzymesNo results for input(s): TROPONINI in the last 168 hours. No results for input(s): TROPIPOC in the last 168 hours.   BNP Recent Labs  Lab 08/06/20 0329  BNP 446.7*     DDimer No results for input(s): DDIMER in the last 168 hours.   Radiology    No results found.  Cardiac Studies  ECHO:  TEE  1. Left ventricular ejection fraction, by estimation, is 55 to 60%. The  left ventricle has normal function.   2. Right ventricular systolic function is normal. The right ventricular  size is normal.   3. Left atrial size was mild to moderately dilated. No left atrial/left  atrial appendage thrombus was detected.   4. Right atrial size was moderately dilated.   5. The mitral valve is normal in structure. Mild mitral valve  regurgitation. No evidence of mitral stenosis.   6. The aortic valve is tricuspid. There is mild calcification of the  aortic valve. There is mild thickening of the aortic valve. Aortic valve  regurgitation is not visualized. Mild aortic valve sclerosis is present,  with no evidence of aortic valve  stenosis.   7. There is Moderate (Grade III) plaque.   8. Successful DCCV performed (200J x1) after TEE with return to NSR.  Please see separate note.   Patient Profile   70 y.o. male with a PMH of coronary artery calcifications, HTN, HLD, BPH s/p TURP, depression, and tobacco abuse,  who is being followed by cardiology  for the evaluation of new onset atrial fibrillation  Assessment & Plan    Hypoxic Respiratory Failure/HFpEF  Status post thoracentesis.   Net negative I/O is 9 liters.  Changed to PO Lasix yesterday and I/O were even.  He can have follow up CXRs as an out patient to follow the course of the pleural effusion.   HTN BP is OK.  No change in therapy.   AF CHASVASC 4 Status post DCCV.  He was briefly in NSR but went back into atrial fib.  Now with rate control.  On amiodarone.   Suggested out patient sleep study.  Continue anticoagulation and possible repeat out patient cardioversion in the future.   Coronary artery calcifications:  Needs primary risk reduction.   No ischemia work up planned   Hyponatremia:   Na is stable at 128.  Restrict free water intake. Was 124 on admission.   Tobacco abuse:  Educated.   ETOH abuse:  Discussed and emphasized cessation.    Follow up  is scheduled with Dr. Mayford Knife for later this month.    For questions or updates, please contact [facility] Please consult [email or web address] for contact info under Cardiology/STEMI.   Signed, Rollene Rotunda, MD  08/08/2020, 11:43 AM

## 2020-08-09 LAB — URINALYSIS, ROUTINE W REFLEX MICROSCOPIC
Bilirubin Urine: NEGATIVE
Glucose, UA: NEGATIVE mg/dL
Hgb urine dipstick: NEGATIVE
Ketones, ur: NEGATIVE mg/dL
Leukocytes,Ua: NEGATIVE
Nitrite: NEGATIVE
Protein, ur: NEGATIVE mg/dL
Specific Gravity, Urine: 1.006 (ref 1.005–1.030)
pH: 8 (ref 5.0–8.0)

## 2020-08-09 LAB — BASIC METABOLIC PANEL
Anion gap: 13 (ref 5–15)
BUN: 20 mg/dL (ref 8–23)
CO2: 29 mmol/L (ref 22–32)
Calcium: 9.5 mg/dL (ref 8.9–10.3)
Chloride: 86 mmol/L — ABNORMAL LOW (ref 98–111)
Creatinine, Ser: 0.61 mg/dL (ref 0.61–1.24)
GFR, Estimated: >60 mL/min (ref >60)
Glucose, Bld: 114 mg/dL — ABNORMAL HIGH (ref 70–99)
Potassium: 4.5 mmol/L (ref 3.5–5.1)
Sodium: 128 mmol/L — ABNORMAL LOW (ref 135–145)

## 2020-08-09 NOTE — Progress Notes (Signed)
Pt was supposed to discharge with daytime nurse. At 1900 during shift change, pt and daughter were wanting a prescription to a nebulizer or have one delivered. MD paged  MD stated if the patient wanted a nebulizer he would have to spend another night and the daytime MD & Case Manager would be able to access him to a nebulizer. Pt agreed to stay overnight

## 2020-08-09 NOTE — Progress Notes (Signed)
Patient discharged via wheel chair with oxygen at this time. Patient hemodynamically stable during discharge.

## 2020-08-09 NOTE — Progress Notes (Signed)
Discharge instruction given to the patient and his daughter. Questions and concerns answered.

## 2020-08-09 NOTE — TOC Progression Note (Signed)
Transition of Care [facility]) - Progression Note    Patient Details  Name: Samuel Barnes MRN: [id number] Date of Birth: 21-Feb-1950  Transition of Care [facility]) CM/SW Contact  Lorri Frederick, LCSW Phone Number: 08/09/2020, 9:37 AM  Clinical Narrative:   Pt now needing nebulizer, CSW spoke with Velna Hatchet at [employer] who will deliver to room.      Expected Discharge Plan: Home w Home Health Services Barriers to Discharge: Barriers Resolved  Expected Discharge Plan and Services Expected Discharge Plan: Home w Home Health Services       Living arrangements for the past 2 months: Single Family Home Expected Discharge Date: 08/08/20               DME Arranged: Oxygen DME Agency: [employer] Date DME Agency Contacted: 08/08/20 Time DME Agency Contacted: 1107 Representative spoke with at DME Agency: Francesco Sor Arranged: PT HH Agency: [facility] Date [facility] Agency Contacted: 08/08/20 Time HH Agency Contacted: 1122 Representative spoke with at [facility] Agency: Kandee Keen   Social Determinants of Health (SDOH) Interventions    Readmission Risk Interventions No flowsheet data found.

## 2020-08-09 NOTE — Progress Notes (Signed)
Patient seen and examined at bedside. Slight confusion per daughter but patient is oriented. Urinalysis and culture obtained. Urinalysis unremarkable. Urine culture pending on discharge. Patient/family to return if any issues but there could be some delirium and family agreed that it would be best to discharge home to see if things improve.  Jacquelin Hawking, MD [facility]

## 2020-08-09 NOTE — Progress Notes (Signed)
Patient's daughter approached this nurse and stated that she was concern about her dad as he was talking incomprehensive. Went to assess the patient and he is able to answer questions appropriately. MD made aware of the situation.  Order for urine analysis and urine culture received. Urine collected and sent for test.

## 2020-08-09 NOTE — Progress Notes (Signed)
Physical Therapy Treatment Patient Details Name: Samuel Barnes MRN: [id number] DOB: 09-16-1950 Today's Date: 08/09/2020    History of Present Illness 70 y.o. male  admitted 7/22   who is being followed by cardiology for the evaluation of new onset atrial fibrillation.  Underwent cardioversion on 7/27.  IPJ:[id number] artery calcifications, HTN, HLD, BPH s/p TURP, depression, and tobacco abuse    PT Comments    Pt admitted with above diagnosis. Discussed use of pulse oximeter with pt so that he understands when to monitor and when to call MD etc.  Pt also given options of where to purchase the pulse oximeter. Pt appreciative.   Pt currently with functional limitations due to endurance deficits. Pt will benefit from skilled PT to increase their independence and safety with mobility to allow discharge to the venue listed below.      Follow Up Recommendations  Home health PT     Equipment Recommendations  Other (comment) (home O2, pulse oximeter)    Recommendations for Other Services       Precautions / Restrictions Precautions Precautions: Fall Restrictions Weight Bearing Restrictions: No    Mobility  Bed Mobility Overal bed mobility: Needs Assistance Bed Mobility: Sit to Supine       Sit to supine: Supervision   General bed mobility comments: Pt EOB on arrival.  Laid down without physical assist    Transfers                 General transfer comment: Declined standing and ambulation due to going home today. Had discussion regarding obtaining pulse oximeter to monitor pts O2.  Current O2 at rest on RA 88%.  With even 1LO2, sats 90% and greater. Instructed pt to monitor throughout day, first thing in am, with activity and if SOB.  Pt understands.  He is aware he can get pulse oximeter online, [employer], or at a local drug store.  Ambulation/Gait                 Stairs             Wheelchair Mobility    Modified Rankin (Stroke Patients Only)        Balance Overall balance assessment: Needs assistance Sitting-balance support: Single extremity supported;Bilateral upper extremity supported Sitting balance-Leahy Scale: Fair Sitting balance - Comments: Intermittently alternates between UEs and uses BUEs for support while performing functional activity EOB   Standing balance support: Bilateral upper extremity supported;During functional activity Standing balance-Leahy Scale: Poor Standing balance comment: Demonstrated increased trunk flexion when standing without BUE support                            Cognition Arousal/Alertness: Awake/alert Behavior During Therapy: WFL for tasks assessed/performed Overall Cognitive Status: Within Functional Limits for tasks assessed                                        Exercises General Exercises - Lower Extremity Long Arc Quad: AROM;Both;10 reps;Seated    General Comments        Pertinent Vitals/Pain Pain Assessment: No/denies pain    Home Living                      Prior Function            PT Goals (current goals can now be found  in the care plan section) Acute Rehab PT Goals Patient Stated Goal: Return home and get stronger Progress towards PT goals: Progressing toward goals    Frequency    Min 3X/week      PT Plan Current plan remains appropriate    Co-evaluation              AM-PAC PT "6 Clicks" Mobility   Outcome Measure  Help needed turning from your back to your side while in a flat bed without using bedrails?: None Help needed moving from lying on your back to sitting on the side of a flat bed without using bedrails?: None Help needed moving to and from a bed to a chair (including a wheelchair)?: None Help needed standing up from a chair using your arms (e.g., wheelchair or bedside chair)?: None Help needed to walk in hospital room?: A Little Help needed climbing 3-5 steps with a railing? : A Little 6 Click  Score: 22    End of Session Equipment Utilized During Treatment: Gait belt;Oxygen Activity Tolerance: Patient limited by fatigue Patient left: in bed;with call bell/phone within reach Nurse Communication: Mobility status PT Visit Diagnosis: Unsteadiness on feet (R26.81);Other abnormalities of gait and mobility (R26.89);Muscle weakness (generalized) (M62.81)     Time: 1051-1110 PT Time Calculation (min) (ACUTE ONLY): 19 min  Charges:  $Self Care/Home Management: 8-22                     [facility] M,PT Acute Rehab Services [phone] (pager)    Bevelyn Buckles 08/09/2020, 11:51 AM

## 2020-08-10 LAB — URINE CULTURE

## 2020-08-11 ENCOUNTER — Emergency Department (HOSPITAL_BASED_OUTPATIENT_CLINIC_OR_DEPARTMENT_OTHER): Payer: Medicare Other

## 2020-08-11 ENCOUNTER — Encounter (HOSPITAL_BASED_OUTPATIENT_CLINIC_OR_DEPARTMENT_OTHER): Payer: Self-pay | Admitting: *Deleted

## 2020-08-11 ENCOUNTER — Other Ambulatory Visit: Payer: Self-pay

## 2020-08-11 ENCOUNTER — Emergency Department (HOSPITAL_BASED_OUTPATIENT_CLINIC_OR_DEPARTMENT_OTHER)
Admission: EM | Admit: 2020-08-11 | Discharge: 2020-08-11 | Disposition: A | Discharge status: Home / Self Care | Payer: Medicare Other | Attending: Emergency Medicine | Admitting: Emergency Medicine

## 2020-08-11 DIAGNOSIS — Z7901 Long term (current) use of anticoagulants: Secondary | ICD-10-CM | POA: Diagnosis not present

## 2020-08-11 DIAGNOSIS — R410 Disorientation, unspecified: Secondary | ICD-10-CM | POA: Diagnosis present

## 2020-08-11 DIAGNOSIS — I110 Hypertensive heart disease with heart failure: Secondary | ICD-10-CM | POA: Insufficient documentation

## 2020-08-11 DIAGNOSIS — Z79899 Other long term (current) drug therapy: Secondary | ICD-10-CM | POA: Diagnosis not present

## 2020-08-11 DIAGNOSIS — F17210 Nicotine dependence, cigarettes, uncomplicated: Secondary | ICD-10-CM | POA: Insufficient documentation

## 2020-08-11 DIAGNOSIS — Z20822 Contact with and (suspected) exposure to covid-19: Secondary | ICD-10-CM | POA: Diagnosis not present

## 2020-08-11 DIAGNOSIS — R4182 Altered mental status, unspecified: Secondary | ICD-10-CM | POA: Diagnosis not present

## 2020-08-11 DIAGNOSIS — I5021 Acute systolic (congestive) heart failure: Secondary | ICD-10-CM | POA: Insufficient documentation

## 2020-08-11 LAB — BASIC METABOLIC PANEL
Anion gap: 7 (ref 5–15)
BUN: 25 mg/dL — ABNORMAL HIGH (ref 8–23)
CO2: 31 mmol/L (ref 22–32)
Calcium: 9.5 mg/dL (ref 8.9–10.3)
Chloride: 93 mmol/L — ABNORMAL LOW (ref 98–111)
Creatinine, Ser: 0.74 mg/dL (ref 0.61–1.24)
GFR, Estimated: >60 mL/min (ref >60)
Glucose, Bld: 87 mg/dL (ref 70–99)
Potassium: 4.6 mmol/L (ref 3.5–5.1)
Sodium: 131 mmol/L — ABNORMAL LOW (ref 135–145)

## 2020-08-11 LAB — CBC WITH DIFFERENTIAL/PLATELET
Abs Immature Granulocytes: 0.04 10*3/uL (ref 0.00–0.07)
Basophils Absolute: 0 10*3/uL (ref 0.0–0.1)
Basophils Relative: 0 %
Eosinophils Absolute: 0.1 10*3/uL (ref 0.0–0.5)
Eosinophils Relative: 1 %
HCT: 45.1 % (ref 39.0–52.0)
Hemoglobin: 15.4 g/dL (ref 13.0–17.0)
Immature Granulocytes: 1 %
Lymphocytes Relative: 8 %
Lymphs Abs: 0.7 10*3/uL (ref 0.7–4.0)
MCH: 32.6 pg (ref 26.0–34.0)
MCHC: 34.1 g/dL (ref 30.0–36.0)
MCV: 95.6 fL (ref 80.0–100.0)
Monocytes Absolute: 1 10*3/uL (ref 0.1–1.0)
Monocytes Relative: 12 %
Neutro Abs: 6.7 10*3/uL (ref 1.7–7.7)
Neutrophils Relative %: 78 %
Platelets: 325 10*3/uL (ref 150–400)
RBC: 4.72 MIL/uL (ref 4.22–5.81)
RDW: 12.8 % (ref 11.5–15.5)
WBC: 8.5 10*3/uL (ref 4.0–10.5)
nRBC: 0 % (ref 0.0–0.2)

## 2020-08-11 LAB — HEPATIC FUNCTION PANEL
ALT: 89 U/L — ABNORMAL HIGH (ref 0–44)
AST: 43 U/L — ABNORMAL HIGH (ref 15–41)
Albumin: 3.6 g/dL (ref 3.5–5.0)
Alkaline Phosphatase: 152 U/L — ABNORMAL HIGH (ref 38–126)
Bilirubin, Direct: 0.4 mg/dL — ABNORMAL HIGH (ref 0.0–0.2)
Indirect Bilirubin: 0.4 mg/dL (ref 0.3–0.9)
Total Bilirubin: 0.8 mg/dL (ref 0.3–1.2)
Total Protein: 7 g/dL (ref 6.5–8.1)

## 2020-08-11 LAB — RESP PANEL BY RT-PCR (FLU A&B, COVID) ARPGX2
Influenza A by PCR: NEGATIVE
Influenza B by PCR: NEGATIVE
SARS Coronavirus 2 by RT PCR: NEGATIVE

## 2020-08-11 LAB — URINALYSIS, ROUTINE W REFLEX MICROSCOPIC
Bilirubin Urine: NEGATIVE
Glucose, UA: NEGATIVE mg/dL
Hgb urine dipstick: NEGATIVE
Ketones, ur: NEGATIVE mg/dL
Leukocytes,Ua: NEGATIVE
Nitrite: NEGATIVE
Protein, ur: NEGATIVE mg/dL
Specific Gravity, Urine: 1.01 (ref 1.005–1.030)
pH: 7 (ref 5.0–8.0)

## 2020-08-11 LAB — TROPONIN I (HIGH SENSITIVITY): Troponin I (High Sensitivity): 3 ng/L (ref <18)

## 2020-08-11 LAB — BRAIN NATRIURETIC PEPTIDE: B Natriuretic Peptide: 328.6 pg/mL — ABNORMAL HIGH (ref 0.0–100.0)

## 2020-08-11 MED ORDER — THIAMINE HCL 100 MG/ML IJ SOLN
100.0000 mg | Freq: Once | INTRAVENOUS | Status: AC
  Administered 2020-08-11: 100 mg via INTRAVENOUS
  Filled 2020-08-11: qty 1

## 2020-08-11 MED ORDER — THIAMINE HCL 100 MG/ML IJ SOLN
100.0000 mg | Freq: Once | INTRAMUSCULAR | Status: DC
  Filled 2020-08-11: qty 2

## 2020-08-11 NOTE — ED Provider Notes (Signed)
[facility] EMERGENCY DEPARTMENT Provider Note   CSN: [id number] Arrival date & time: 08/11/20  1152     History Chief Complaint  Patient presents with   Altered Mental Status    Samuel Barnes is a 70 y.o. male.  She brought in by family member due to concern for confusion.  Just had long hospital stay where he was diagnosed with A. fib and now on blood thinner.  Patient has had confusion last several days.  Possibly was also having some confusion while in the hospital.  He lives at home by himself but does have some home health.  Family states that patient has been exhibiting some bizarre behavior.  Making phone calls to people and given away some private information that he should not be.  He overall has no complaints.  Denies any headache or chills or fevers.  He can tell me his name.  Family worried about may be medication side effect.  The history is provided by the patient.  Altered Mental Status Presenting symptoms: confusion   Severity:  Mild Timing:  Intermittent Progression:  Waxing and waning Context: alcohol use (no use for the last few weeks)   Associated symptoms: no abdominal pain, no fever, no palpitations, no rash, no seizures and no vomiting       Past Medical History:  Diagnosis Date   Aortic atherosclerosis (HCC) 07/28/2020   BPH (benign prostatic hyperplasia)    Class 2 obesity 07/28/2020   Coronary artery calcification 07/28/2020   Depression    Family history of adverse reaction to anesthesia    Hyperlipidemia 07/28/2020   Hypertension    Tobacco use 07/28/2020    Patient Active Problem List   Diagnosis Date Noted   Hyperglycemia 07/29/2020   New onset a-fib [facility])    Acute congestive heart failure (HCC)    Acute respiratory failure with hypoxia (HCC) 07/28/2020   Pericardial effusion 07/28/2020   Aortic atherosclerosis (HCC) 07/28/2020   Coronary artery calcification 07/28/2020   Class 2 obesity 07/28/2020   Hyperlipidemia  07/28/2020   Tobacco use 07/28/2020   Prolonged QT interval 07/28/2020   Hypocalcemia 07/28/2020   Alcohol abuse 07/28/2020   Fatty liver 07/28/2020   BPH (benign prostatic hyperplasia)    Depression    Hypertension    Hyponatremia    Hypomagnesemia    New onset atrial fibrillation [facility])     Past Surgical History:  Procedure Laterality Date   CARDIOVERSION N/A 08/02/2020   Procedure: CARDIOVERSION;  Surgeon: Meriam Sprague, MD;  Location[facility] ENDOSCOPY;  Service: Cardiovascular;  Laterality: N/A;   IR THORACENTESIS ASP PLEURAL SPACE W/IMG GUIDE  07/31/2020   TEE WITHOUT CARDIOVERSION N/A 08/02/2020   Procedure: TRANSESOPHAGEAL ECHOCARDIOGRAM (TEE);  Surgeon: Meriam Sprague, MD;  Location: [facility] ENDOSCOPY;  Service: Cardiovascular;  Laterality: N/A;   TRANSURETHRAL RESECTION OF PROSTATE         Family History  Problem Relation Age of Onset   Coronary artery disease Mother    Hypertension Mother    Hyperlipidemia Mother    Ovarian cancer Mother    Ovarian cancer Sister    Liver cancer Sister    Liver cancer Brother     Social History   Tobacco Use   Smoking status: Every Day    Types: Cigarettes   Smokeless tobacco: Never  Vaping Use   Vaping Use: Never used  Substance Use Topics   Alcohol use: Yes    Alcohol/week: 1.0 standard drink  Types: 1 Shots of liquor per week    Comment: 3-4 times a week   Drug use: Never    Home Medications Prior to Admission medications   Medication Sig Start Date End Date Taking? Authorizing Provider  albuterol (VENTOLIN HFA) 108 (90 Base) MCG/ACT inhaler Inhale 2 puffs into the lungs every 4 (four) hours as needed for wheezing or shortness of breath. 08/08/20   Glade LloydAlekh, Kshitiz, MD  amiodarone (PACERONE) 200 MG tablet Take 1 tablet (200 mg) by mouth twice daily till 08/17/2020 and then take 1 tablet (200 mg) once daily from 08/18/2020 onwards as per cardiology 08/08/20   Glade LloydAlekh, Kshitiz, MD  apixaban (ELIQUIS) 5 MG TABS tablet Take 1  tablet (5 mg total) by mouth 2 (two) times daily. 08/08/20   Glade LloydAlekh, Kshitiz, MD  FLUoxetine (PROZAC) 20 MG capsule Take 20 mg by mouth at bedtime.    [provider]  folic acid (FOLVITE) 1 MG tablet Take 1 tablet (1 mg total) by mouth daily. 08/09/20   Glade LloydAlekh, Kshitiz, MD  furosemide (LASIX) 80 MG tablet Take 1 tablet (80 mg total) by mouth 2 (two) times daily. 08/08/20   Glade LloydAlekh, Kshitiz, MD  ipratropium-albuterol (DUONEB) 0.5-2.5 (3) MG/3ML SOLN Use 1 vial by nebulization every 6 (six) hours as needed. 08/08/20   Glade LloydAlekh, Kshitiz, MD  losartan (COZAAR) 50 MG tablet Take 1 tablet (50 mg total) by mouth daily. 08/09/20   Glade LloydAlekh, Kshitiz, MD  lovastatin (MEVACOR) 20 MG tablet Take 20 mg by mouth daily.    [provider]  metoprolol succinate (TOPROL-XL) 100 MG 24 hr tablet Take 1 tablet (100 mg total) by mouth daily. Take with or immediately following a meal. 08/09/20   Glade LloydAlekh, Kshitiz, MD  mometasone-formoterol (DULERA) 100-5 MCG/ACT AERO Inhale 2 puffs into the lungs 2 (two) times daily. 08/08/20   Glade LloydAlekh, Kshitiz, MD  montelukast (SINGULAIR) 10 MG tablet Take 10 mg by mouth daily.    [provider]  Multiple Vitamin (MULTIVITAMIN WITH MINERALS) TABS tablet Take 1 tablet by mouth daily. 08/09/20   Glade LloydAlekh, Kshitiz, MD  potassium chloride SA (KLOR-CON) 20 MEQ tablet Take 1 tablet (20 mEq total) by mouth 2 (two) times daily. 08/08/20   Glade LloydAlekh, Kshitiz, MD  terazosin (HYTRIN) 5 MG capsule Take 5 mg by mouth daily.    [provider]  thiamine 100 MG tablet Take 1 tablet (100 mg total) by mouth daily. 08/09/20   Glade LloydAlekh, Kshitiz, MD    Allergies    Ciprofloxacin and Gabapentin  Review of Systems   Review of Systems  Constitutional:  Negative for chills and fever.  HENT:  Negative for ear pain and sore throat.   Eyes:  Negative for pain and visual disturbance.  Respiratory:  Negative for cough and shortness of breath.   Cardiovascular:  Negative for chest pain and palpitations.   Gastrointestinal:  Negative for abdominal pain and vomiting.  Genitourinary:  Negative for dysuria and hematuria.  Musculoskeletal:  Negative for arthralgias and back pain.  Skin:  Negative for color change and rash.  Neurological:  Negative for seizures and syncope.  Psychiatric/Behavioral:  Positive for confusion.   All other systems reviewed and are negative.  Physical Exam Updated Vital Signs BP 112/81   Pulse 76   Temp 99.5 F (37.5 C) (Oral)   Resp (!) 22   Ht 5\' 8"  (1.727 m)   Wt 94 kg   SpO2 91%   BMI 31.51 kg/m   Physical Exam Vitals and nursing note  reviewed.  Constitutional:      General: He is not in acute distress.    Appearance: He is well-developed. He is not ill-appearing.  HENT:     Head: Normocephalic and atraumatic.     Nose: Nose normal.     Mouth/Throat:     Mouth: Mucous membranes are moist.  Eyes:     Extraocular Movements: Extraocular movements intact.     Conjunctiva/sclera: Conjunctivae normal.     Pupils: Pupils are equal, round, and reactive to light.  Cardiovascular:     Rate and Rhythm: Normal rate and regular rhythm.     Pulses: Normal pulses.     Heart sounds: Normal heart sounds. No murmur heard. Pulmonary:     Effort: Pulmonary effort is normal. No respiratory distress.     Breath sounds: Normal breath sounds.  Abdominal:     Palpations: Abdomen is soft.     Tenderness: There is no abdominal tenderness.  Musculoskeletal:     Cervical back: Normal range of motion and neck supple.  Skin:    General: Skin is warm and dry.     Capillary Refill: Capillary refill takes less than 2 seconds.  Neurological:     General: No focal deficit present.     Mental Status: He is alert.     Sensory: No sensory deficit.     Motor: No weakness.     Comments: Normal strength and sensation, can tell me his name, follows commands, normal speech, normal visual fields    ED Results / Procedures / Treatments   Labs (all labs ordered are listed, but  only abnormal results are displayed) Labs Reviewed  BASIC METABOLIC PANEL - Abnormal; Notable for the following components:      Result Value   Sodium 131 (*)    Chloride 93 (*)    BUN 25 (*)    All other components within normal limits  BRAIN NATRIURETIC PEPTIDE - Abnormal; Notable for the following components:   B Natriuretic Peptide 328.6 (*)    All other components within normal limits  RESP PANEL BY RT-PCR (FLU A&B, COVID) ARPGX2  CBC WITH DIFFERENTIAL/PLATELET  URINALYSIS, ROUTINE W REFLEX MICROSCOPIC  ETHANOL  HEPATIC FUNCTION PANEL  TROPONIN I (HIGH SENSITIVITY)  TROPONIN I (HIGH SENSITIVITY)    EKG EKG Interpretation  Date/Time:  Friday August 11 2020 12:28:56 EDT Ventricular Rate:  77 PR Interval:    QRS Duration: 107 QT Interval:  365 QTC Calculation: 411 R Axis:   -9 Text Interpretation: Atrial fibrillation Anterior infarct, age indeterminate Confirmed by Virgina Norfolk [phone]) on 08/11/2020 12:36:07 PM  Radiology CT HEAD WO CONTRAST ( )  Result Date: 08/11/2020 CLINICAL DATA:  Altered mental status. Additional provided: Confusion, hearing voices. EXAM: CT HEAD WITHOUT CONTRAST TECHNIQUE: Contiguous axial images were obtained from the base of the skull through the vertex without intravenous contrast. COMPARISON:  Prior head CT examinations 04/18/2017 and earlier. FINDINGS: Brain: Frontal lobe predominant atrophy. There is no acute intracranial hemorrhage. No demarcated cortical infarct. No extra-axial fluid collection. No evidence of an intracranial mass. No midline shift. Vascular: No hyperdense vessel.  Atherosclerotic calcifications Skull: Normal. Negative for fracture or focal lesion. Sinuses/Orbits: Visualized orbits show no acute finding. Small volume frothy secretions within the right sphenoid sinus. Trace bilateral ethmoid sinus mucosal thickening. IMPRESSION: No evidence of acute intracranial abnormality. Frontal lobe predominant cerebral atrophy. Mild paranasal  sinus disease at the imaged levels, as described. Electronically Signed   By: Jackey Loge DO  On: 08/11/2020 13:23   DG Chest Portable 1 View  Result Date: 08/11/2020 CLINICAL DATA:  Altered mental status. Additional history provided: Confusion, hearing voices. EXAM: PORTABLE CHEST 1 VIEW COMPARISON:  Prior chest radiographs 08/03/2020 and earlier. CT angiogram chest 07/28/2020. FINDINGS: Unchanged cardiomegaly. Aortic atherosclerosis. Persistent right pleural effusion with associated right basilar atelectasis and/or consolidation. Mild atelectasis within the left lung base. No evidence of pneumothorax. Redemonstrated chronic right-sided rib fracture deformities. IMPRESSION: Persistent right pleural effusion with associated right basilar atelectasis and/or consolidation. Mild atelectasis within the left lung base, new from the prior exam of 08/03/2020. Unchanged cardiomegaly. Aortic Atherosclerosis (ICD10-I70.0). Electronically Signed   By: Jackey Loge DO   On: 08/11/2020 13:26    Procedures Procedures   Medications Ordered in ED Medications  thiamine (B-1) 100 mg in sodium chloride 0.9 % 50 mL IVPB (100 mg Intravenous New Bag/Given 08/11/20 1313)    ED Course  I have reviewed the triage vital signs and the nursing notes.  Pertinent labs & imaging results that were available during my care of the patient were reviewed by me and considered in my medical decision making (see chart for details).    MDM Rules/Calculators/A&P                           GRAISON LEINBERGER is a 70 year old male with history of depression, hypertension, high cholesterol who presents to the ED with confusion/altered mental status.  Normal vitals.  No fever.  Family brought patient here is concerned about his confusion the last several days.  Used to be a daily drinker of alcohol but has not had any for the last week plus.  Did not have any alcohol during his hospital stay and since then.  All alcohol has been  removed from the house.  Family member states that patient has just been off the last few days.  Have been having some agitation and overall delirious type symptoms sounds like.  No history of memory issues or dementia but possibly playing a role.  Did start some new medications during the hospital stay but these medications are not known to cause any type of confusion.  Overall he appears neurologically intact.  He does have home health coming to the house.  Overall will check basic labs and pursue infectious work-up and head CT and check for any electrolyte abnormality, kidney injury.  No significant leukocytosis, anemia, electrolyte abnormality.  No kidney injury.  Troponin and BNP overall unremarkable.  COVID test and flu test negative.  Urinalysis negative for infection.  Chest x-ray with no major acute changes.  No concern for pneumonia.  CT scan with no head bleed.  There is some cerebral atrophy.  Overall work-up is unremarkable.  Suspect that there is some underlying dementia now with some mild behavioral disturbance.  Recommend close follow-up with primary care doctor which is arranged already for next week.  Understand return precautions and discharged from the ED in good condition.  This chart was dictated using voice recognition software.  Despite best efforts to proofread,  errors can occur which can change the documentation meaning.   Final Clinical Impression(s) / ED Diagnoses Final diagnoses:  Altered mental status, unspecified altered mental status type    Rx / DC Orders ED Discharge Orders     None        Virgina Norfolk, DO 08/11/20 1420

## 2020-08-11 NOTE — ED Triage Notes (Addendum)
Hearing voices. He was discharged from [facility] yesterday with CHF and COPD. He was confused prior to D/C but after several of his medications were changed. Unable to care for himself at home. Confused. He is on home oxygen. He has not had alcohol intake in 2 weeks.

## 2020-08-11 NOTE — Discharge Instructions (Addendum)
Overall suspect likely a chronic memory issue ongoing.  May be some resolving delirium from hospital stay.  Follow-up with primary care doctor as discussed.

## 2020-08-11 NOTE — ED Notes (Signed)
Call to lab to add hepatic function panel to labs that were already drawn, there will be a delay due to equipment in lab being down.

## 2020-08-14 ENCOUNTER — Emergency Department (HOSPITAL_COMMUNITY): Payer: Medicare Other

## 2020-08-14 ENCOUNTER — Observation Stay (HOSPITAL_COMMUNITY): Payer: Medicare Other

## 2020-08-14 ENCOUNTER — Other Ambulatory Visit: Payer: Self-pay

## 2020-08-14 ENCOUNTER — Observation Stay (HOSPITAL_BASED_OUTPATIENT_CLINIC_OR_DEPARTMENT_OTHER): Payer: Medicare Other

## 2020-08-14 ENCOUNTER — Inpatient Hospital Stay (HOSPITAL_COMMUNITY)
Admission: EM | Admit: 2020-08-14 | Discharge: 2020-08-21 | DRG: 641 | Disposition: A | Discharge status: Home Health | Payer: Medicare Other | Attending: Internal Medicine | Admitting: Internal Medicine

## 2020-08-14 ENCOUNTER — Encounter (HOSPITAL_COMMUNITY): Payer: Self-pay | Admitting: *Deleted

## 2020-08-14 DIAGNOSIS — I5032 Chronic diastolic (congestive) heart failure: Secondary | ICD-10-CM | POA: Diagnosis present

## 2020-08-14 DIAGNOSIS — Z20822 Contact with and (suspected) exposure to covid-19: Secondary | ICD-10-CM | POA: Diagnosis present

## 2020-08-14 DIAGNOSIS — E512 Wernicke's encephalopathy: Principal | ICD-10-CM | POA: Diagnosis present

## 2020-08-14 DIAGNOSIS — R410 Disorientation, unspecified: Secondary | ICD-10-CM | POA: Diagnosis not present

## 2020-08-14 DIAGNOSIS — G9341 Metabolic encephalopathy: Secondary | ICD-10-CM | POA: Diagnosis present

## 2020-08-14 DIAGNOSIS — Z9981 Dependence on supplemental oxygen: Secondary | ICD-10-CM

## 2020-08-14 DIAGNOSIS — I700 Atherosclerosis of aorta: Secondary | ICD-10-CM | POA: Diagnosis present

## 2020-08-14 DIAGNOSIS — R0602 Shortness of breath: Secondary | ICD-10-CM

## 2020-08-14 DIAGNOSIS — I2510 Atherosclerotic heart disease of native coronary artery without angina pectoris: Secondary | ICD-10-CM | POA: Diagnosis present

## 2020-08-14 DIAGNOSIS — Z8249 Family history of ischemic heart disease and other diseases of the circulatory system: Secondary | ICD-10-CM

## 2020-08-14 DIAGNOSIS — Z881 Allergy status to other antibiotic agents status: Secondary | ICD-10-CM

## 2020-08-14 DIAGNOSIS — I4891 Unspecified atrial fibrillation: Secondary | ICD-10-CM

## 2020-08-14 DIAGNOSIS — F17210 Nicotine dependence, cigarettes, uncomplicated: Secondary | ICD-10-CM | POA: Diagnosis present

## 2020-08-14 DIAGNOSIS — F1026 Alcohol dependence with alcohol-induced persisting amnestic disorder: Secondary | ICD-10-CM | POA: Diagnosis present

## 2020-08-14 DIAGNOSIS — I110 Hypertensive heart disease with heart failure: Secondary | ICD-10-CM | POA: Diagnosis present

## 2020-08-14 DIAGNOSIS — R55 Syncope and collapse: Secondary | ICD-10-CM

## 2020-08-14 DIAGNOSIS — N400 Enlarged prostate without lower urinary tract symptoms: Secondary | ICD-10-CM | POA: Diagnosis present

## 2020-08-14 DIAGNOSIS — N179 Acute kidney failure, unspecified: Secondary | ICD-10-CM | POA: Diagnosis present

## 2020-08-14 DIAGNOSIS — F05 Delirium due to known physiological condition: Secondary | ICD-10-CM | POA: Diagnosis present

## 2020-08-14 DIAGNOSIS — Z79899 Other long term (current) drug therapy: Secondary | ICD-10-CM

## 2020-08-14 DIAGNOSIS — Z888 Allergy status to other drugs, medicaments and biological substances status: Secondary | ICD-10-CM

## 2020-08-14 DIAGNOSIS — I4819 Other persistent atrial fibrillation: Secondary | ICD-10-CM | POA: Diagnosis present

## 2020-08-14 DIAGNOSIS — Z7901 Long term (current) use of anticoagulants: Secondary | ICD-10-CM

## 2020-08-14 DIAGNOSIS — E785 Hyperlipidemia, unspecified: Secondary | ICD-10-CM | POA: Diagnosis present

## 2020-08-14 DIAGNOSIS — F32A Depression, unspecified: Secondary | ICD-10-CM | POA: Diagnosis present

## 2020-08-14 DIAGNOSIS — J9621 Acute and chronic respiratory failure with hypoxia: Secondary | ICD-10-CM | POA: Diagnosis present

## 2020-08-14 LAB — CBC WITH DIFFERENTIAL/PLATELET
Abs Immature Granulocytes: 0.07 10*3/uL (ref 0.00–0.07)
Basophils Absolute: 0 10*3/uL (ref 0.0–0.1)
Basophils Relative: 0 %
Eosinophils Absolute: 0 10*3/uL (ref 0.0–0.5)
Eosinophils Relative: 0 %
HCT: 50.1 % (ref 39.0–52.0)
Hemoglobin: 16.4 g/dL (ref 13.0–17.0)
Immature Granulocytes: 1 %
Lymphocytes Relative: 7 %
Lymphs Abs: 0.7 10*3/uL (ref 0.7–4.0)
MCH: 31.9 pg (ref 26.0–34.0)
MCHC: 32.7 g/dL (ref 30.0–36.0)
MCV: 97.5 fL (ref 80.0–100.0)
Monocytes Absolute: 0.8 10*3/uL (ref 0.1–1.0)
Monocytes Relative: 8 %
Neutro Abs: 8.6 10*3/uL — ABNORMAL HIGH (ref 1.7–7.7)
Neutrophils Relative %: 84 %
Platelets: 339 10*3/uL (ref 150–400)
RBC: 5.14 MIL/uL (ref 4.22–5.81)
RDW: 12.7 % (ref 11.5–15.5)
WBC: 10.2 10*3/uL (ref 4.0–10.5)
nRBC: 0 % (ref 0.0–0.2)

## 2020-08-14 LAB — HEPATIC FUNCTION PANEL
ALT: 91 U/L — ABNORMAL HIGH (ref 0–44)
AST: 50 U/L — ABNORMAL HIGH (ref 15–41)
Albumin: 3.7 g/dL (ref 3.5–5.0)
Alkaline Phosphatase: 134 U/L — ABNORMAL HIGH (ref 38–126)
Bilirubin, Direct: 0.4 mg/dL — ABNORMAL HIGH (ref 0.0–0.2)
Indirect Bilirubin: 0.9 mg/dL (ref 0.3–0.9)
Total Bilirubin: 1.3 mg/dL — ABNORMAL HIGH (ref 0.3–1.2)
Total Protein: 7.3 g/dL (ref 6.5–8.1)

## 2020-08-14 LAB — I-STAT VENOUS BLOOD GAS, ED
Acid-Base Excess: 6 mmol/L — ABNORMAL HIGH (ref 0.0–2.0)
Bicarbonate: 30.2 mmol/L — ABNORMAL HIGH (ref 20.0–28.0)
Calcium, Ion: 1.14 mmol/L — ABNORMAL LOW (ref 1.15–1.40)
HCT: 51 % (ref 39.0–52.0)
Hemoglobin: 17.3 g/dL — ABNORMAL HIGH (ref 13.0–17.0)
O2 Saturation: 99 %
Potassium: 4.5 mmol/L (ref 3.5–5.1)
Sodium: 134 mmol/L — ABNORMAL LOW (ref 135–145)
TCO2: 31 mmol/L (ref 22–32)
pCO2, Ven: 41.6 mmHg — ABNORMAL LOW (ref 44.0–60.0)
pH, Ven: 7.469 — ABNORMAL HIGH (ref 7.250–7.430)
pO2, Ven: 133 mmHg — ABNORMAL HIGH (ref 32.0–45.0)

## 2020-08-14 LAB — TROPONIN I (HIGH SENSITIVITY): Troponin I (High Sensitivity): 5 ng/L (ref <18)

## 2020-08-14 LAB — BASIC METABOLIC PANEL
Anion gap: 13 (ref 5–15)
BUN: 35 mg/dL — ABNORMAL HIGH (ref 8–23)
CO2: 24 mmol/L (ref 22–32)
Calcium: 9.6 mg/dL (ref 8.9–10.3)
Chloride: 97 mmol/L — ABNORMAL LOW (ref 98–111)
Creatinine, Ser: 1.26 mg/dL — ABNORMAL HIGH (ref 0.61–1.24)
GFR, Estimated: >60 mL/min (ref >60)
Glucose, Bld: 130 mg/dL — ABNORMAL HIGH (ref 70–99)
Potassium: 4.5 mmol/L (ref 3.5–5.1)
Sodium: 134 mmol/L — ABNORMAL LOW (ref 135–145)

## 2020-08-14 LAB — SALICYLATE LEVEL: Salicylate Lvl: <7 mg/dL — ABNORMAL LOW (ref 7.0–30.0)

## 2020-08-14 LAB — LACTIC ACID, PLASMA: Lactic Acid, Venous: <0.3 mmol/L — ABNORMAL LOW (ref 0.5–1.9)

## 2020-08-14 LAB — URINALYSIS, ROUTINE W REFLEX MICROSCOPIC
Bilirubin Urine: NEGATIVE
Glucose, UA: NEGATIVE mg/dL
Hgb urine dipstick: NEGATIVE
Ketones, ur: NEGATIVE mg/dL
Leukocytes,Ua: NEGATIVE
Nitrite: NEGATIVE
Protein, ur: NEGATIVE mg/dL
Specific Gravity, Urine: 1.036 — ABNORMAL HIGH (ref 1.005–1.030)
pH: 5 (ref 5.0–8.0)

## 2020-08-14 LAB — AMMONIA: Ammonia: 28 umol/L (ref 9–35)

## 2020-08-14 LAB — ACETAMINOPHEN LEVEL: Acetaminophen (Tylenol), Serum: <10 ug/mL — ABNORMAL LOW (ref 10–30)

## 2020-08-14 LAB — ETHANOL: Alcohol, Ethyl (B): <10 mg/dL (ref <10)

## 2020-08-14 LAB — RESP PANEL BY RT-PCR (FLU A&B, COVID) ARPGX2
Influenza A by PCR: NEGATIVE
Influenza B by PCR: NEGATIVE
SARS Coronavirus 2 by RT PCR: NEGATIVE

## 2020-08-14 LAB — BRAIN NATRIURETIC PEPTIDE: B Natriuretic Peptide: 135.2 pg/mL — ABNORMAL HIGH (ref 0.0–100.0)

## 2020-08-14 LAB — LIPASE, BLOOD: Lipase: 34 U/L (ref 11–51)

## 2020-08-14 LAB — MAGNESIUM: Magnesium: 2.3 mg/dL (ref 1.7–2.4)

## 2020-08-14 MED ORDER — IOHEXOL 300 MG/ML  SOLN
100.0000 mL | Freq: Once | INTRAMUSCULAR | Status: AC | PRN
  Administered 2020-08-14: 100 mL via INTRAVENOUS

## 2020-08-14 MED ORDER — APIXABAN 5 MG PO TABS
5.0000 mg | ORAL_TABLET | Freq: Two times a day (BID) | ORAL | Status: DC
  Administered 2020-08-14 – 2020-08-21 (×14): 5 mg via ORAL
  Filled 2020-08-14 (×14): qty 1

## 2020-08-14 MED ORDER — MORPHINE SULFATE (PF) 2 MG/ML IV SOLN
2.0000 mg | INTRAVENOUS | Status: DC | PRN
Start: 2020-08-14 — End: 2020-08-21

## 2020-08-14 MED ORDER — TRAZODONE HCL 50 MG PO TABS
25.0000 mg | ORAL_TABLET | Freq: Every evening | ORAL | Status: DC | PRN
  Administered 2020-08-20 (×2): 25 mg via ORAL
  Filled 2020-08-14 (×2): qty 1

## 2020-08-14 MED ORDER — MONTELUKAST SODIUM 10 MG PO TABS
10.0000 mg | ORAL_TABLET | Freq: Every day | ORAL | Status: DC
Start: 2020-08-14 — End: 2020-08-21
  Administered 2020-08-14 – 2020-08-21 (×8): 10 mg via ORAL
  Filled 2020-08-14 (×8): qty 1

## 2020-08-14 MED ORDER — METOPROLOL SUCCINATE ER 100 MG PO TB24
100.0000 mg | ORAL_TABLET | Freq: Every day | ORAL | Status: DC
  Administered 2020-08-14 – 2020-08-21 (×8): 100 mg via ORAL
  Filled 2020-08-14 (×4): qty 1
  Filled 2020-08-14: qty 4
  Filled 2020-08-14 (×3): qty 1

## 2020-08-14 MED ORDER — AMIODARONE HCL 200 MG PO TABS
200.0000 mg | ORAL_TABLET | Freq: Two times a day (BID) | ORAL | Status: DC
  Administered 2020-08-14 – 2020-08-21 (×14): 200 mg via ORAL
  Filled 2020-08-14 (×14): qty 1

## 2020-08-14 MED ORDER — IPRATROPIUM-ALBUTEROL 0.5-2.5 (3) MG/3ML IN SOLN
3.0000 mL | Freq: Four times a day (QID) | RESPIRATORY_TRACT | Status: DC | PRN
  Administered 2020-08-16 – 2020-08-20 (×8): 3 mL via RESPIRATORY_TRACT
  Filled 2020-08-14 (×8): qty 3

## 2020-08-14 MED ORDER — MOMETASONE FURO-FORMOTEROL FUM 100-5 MCG/ACT IN AERO
2.0000 | INHALATION_SPRAY | Freq: Two times a day (BID) | RESPIRATORY_TRACT | Status: DC
  Administered 2020-08-15 (×2): 2 via RESPIRATORY_TRACT
  Filled 2020-08-14 (×2): qty 8.8

## 2020-08-14 MED ORDER — POTASSIUM CHLORIDE CRYS ER 20 MEQ PO TBCR
20.0000 meq | EXTENDED_RELEASE_TABLET | Freq: Two times a day (BID) | ORAL | Status: DC
  Administered 2020-08-14 – 2020-08-21 (×14): 20 meq via ORAL
  Filled 2020-08-14 (×14): qty 1

## 2020-08-14 MED ORDER — HYDROCODONE-ACETAMINOPHEN 5-325 MG PO TABS: 1.0000 | ORAL_TABLET | ORAL | Status: DC | PRN

## 2020-08-14 MED ORDER — THIAMINE HCL 100 MG/ML IJ SOLN
500.0000 mg | Freq: Three times a day (TID) | INTRAVENOUS | Status: AC
  Administered 2020-08-14 – 2020-08-16 (×6): 500 mg via INTRAVENOUS
  Filled 2020-08-14 (×8): qty 5

## 2020-08-14 MED ORDER — POLYETHYLENE GLYCOL 3350 17 G PO PACK: 17.0000 g | PACK | Freq: Every day | ORAL | Status: DC | PRN

## 2020-08-14 MED ORDER — FOLIC ACID 1 MG PO TABS
1.0000 mg | ORAL_TABLET | Freq: Every day | ORAL | Status: DC
  Administered 2020-08-14 – 2020-08-21 (×8): 1 mg via ORAL
  Filled 2020-08-14 (×8): qty 1

## 2020-08-14 MED ORDER — FLUOXETINE HCL 20 MG PO CAPS
20.0000 mg | ORAL_CAPSULE | Freq: Every day | ORAL | Status: DC
  Administered 2020-08-14 – 2020-08-20 (×7): 20 mg via ORAL
  Filled 2020-08-14 (×7): qty 1

## 2020-08-14 MED ORDER — PRAVASTATIN SODIUM 10 MG PO TABS
20.0000 mg | ORAL_TABLET | Freq: Every day | ORAL | Status: DC
  Administered 2020-08-14 – 2020-08-20 (×7): 20 mg via ORAL
  Filled 2020-08-14 (×8): qty 2

## 2020-08-14 MED ORDER — ADULT MULTIVITAMIN W/MINERALS CH
1.0000 | ORAL_TABLET | Freq: Every day | ORAL | Status: DC
  Administered 2020-08-14 – 2020-08-21 (×8): 1 via ORAL
  Filled 2020-08-14 (×8): qty 1

## 2020-08-14 MED ORDER — TERAZOSIN HCL 5 MG PO CAPS
5.0000 mg | ORAL_CAPSULE | Freq: Every day | ORAL | Status: DC
  Administered 2020-08-14 – 2020-08-21 (×7): 5 mg via ORAL
  Filled 2020-08-14 (×8): qty 1

## 2020-08-14 MED ORDER — ACETAMINOPHEN 650 MG RE SUPP: 650.0000 mg | Freq: Four times a day (QID) | RECTAL | Status: DC | PRN

## 2020-08-14 MED ORDER — THIAMINE HCL 100 MG PO TABS: 100.0000 mg | ORAL_TABLET | Freq: Every day | ORAL | Status: DC

## 2020-08-14 MED ORDER — ACETAMINOPHEN 325 MG PO TABS
650.0000 mg | ORAL_TABLET | Freq: Four times a day (QID) | ORAL | Status: DC | PRN
  Administered 2020-08-19 – 2020-08-21 (×3): 650 mg via ORAL
  Filled 2020-08-14 (×3): qty 2

## 2020-08-14 NOTE — H&P (Signed)
History and Physical    FLORENCE ANTONELLI ZOX:[id number] DOB: April 20, 1950 DOA: 08/14/2020  PCP: Pcp, No  Chief Complaint: Altered mental status, syncope  HPI: Samuel Barnes is a 70 y.o. male with a past medical history of CHF with chronic hypoxic respiratory failure on 3 L oxygen at baseline, atrial fibrillation on Eliquis, BPH, coronary artery disease, hyperlipidemia, tobacco use, obesity, BPH, depression.  The patient presented to the emergency department due to a syncopal episode today and altered mental status.  He lives alone at home.  But daughter is present at bedside states that the patient was found outside on the deck this morning. She stated this is unusual for him to go outside to smoke in the morning but when she found him he was just sitting there and not responding verbally with his head down.  Stated this went on for approximately 30 minutes. She shook him and yelled at him but he would not wake up.  When EMS arrived they could not get him respond as well.  When he was moved onto the stretcher by EMS he did wake up and start answering questions.  Daughter states this is unusual for him and this all started approximately 1 week ago since his previous hospitalization.  At that time he was admitted for CHF exacerbation and for A. fib.  Upon my evaluation he is alert and oriented x3.  Able to move all 4 extremities.  No focal neurological deficits.  No hallucinations.  No previous history of seizure disorder.  Daughter states that he requires a lot of prompting to do tasks now.  Difficulty using a fork at home to eat himself.  He has been also more forgetful lately.  Does recognize family.  He has been dropping things more often.  He did have a history of alcohol abuse but has not been drinking for the past few weeks per daughter.  Approximately 3 weeks she states. She states that he has unsteady gait as well.  ED Course: VBG, EKG, COVID-19 PCR, flu A/B PCR, CT head without  contrast, Tylenol level, ethanol level, UDS, BNP, BMP, liver panel, lipase, troponins, ammonia, magnesium level, CBC.  Review of Systems: 14 point review of systems is negative except for what is mentioned above in the HPI.   Past Medical History:  Diagnosis Date   Aortic atherosclerosis (HCC) 07/28/2020   BPH (benign prostatic hyperplasia)    Class 2 obesity 07/28/2020   Coronary artery calcification 07/28/2020   Depression    Family history of adverse reaction to anesthesia    Hyperlipidemia 07/28/2020   Hypertension    Tobacco use 07/28/2020    Past Surgical History:  Procedure Laterality Date   CARDIOVERSION N/A 08/02/2020   Procedure: CARDIOVERSION;  Surgeon: Meriam Sprague, MD;  Location: [facility] ENDOSCOPY;  Service: Cardiovascular;  Laterality: N/A;   IR THORACENTESIS ASP PLEURAL SPACE W/IMG GUIDE  07/31/2020   TEE WITHOUT CARDIOVERSION N/A 08/02/2020   Procedure: TRANSESOPHAGEAL ECHOCARDIOGRAM (TEE);  Surgeon: Meriam Sprague, MD;  Location: [facility] ENDOSCOPY;  Service: Cardiovascular;  Laterality: N/A;   TRANSURETHRAL RESECTION OF PROSTATE      Social History   Socioeconomic History   Marital status: Divorced    Spouse name: Not on file   Number of children: Not on file   Years of education: Not on file   Highest education level: Not on file  Occupational History   Not on file  Tobacco Use   Smoking status: Every Day  Types: Cigarettes   Smokeless tobacco: Never  Vaping Use   Vaping Use: Never used  Substance and Sexual Activity   Alcohol use: Yes    Alcohol/week: 1.0 standard drink    Types: 1 Shots of liquor per week    Comment: 3-4 times a week   Drug use: Never   Sexual activity: Not on file  Other Topics Concern   Not on file  Social History Narrative   Not on file   Social Determinants of Health   Financial Resource Strain: Not on file  Food Insecurity: Not on file  Transportation Needs: Not on file  Physical Activity: Not on file  Stress:  Not on file  Social Connections: Not on file  Intimate Partner Violence: Not on file    Allergies  Allergen Reactions   Ciprofloxacin Shortness Of Breath and Other (See Comments)    Numbness in extremities    Gabapentin Other (See Comments)    Mental problems, depression, anger    Family History  Problem Relation Age of Onset   Coronary artery disease Mother    Hypertension Mother    Hyperlipidemia Mother    Ovarian cancer Mother    Ovarian cancer Sister    Liver cancer Sister    Liver cancer Brother     Prior to Admission medications   Medication Sig Start Date End Date Taking? Authorizing Provider  albuterol (VENTOLIN HFA) 108 (90 Base) MCG/ACT inhaler Inhale 2 puffs into the lungs every 4 (four) hours as needed for wheezing or shortness of breath. 08/08/20  Yes Glade Lloyd, MD  amiodarone (PACERONE) 200 MG tablet Take 1 tablet (200 mg) by mouth twice daily till 08/17/2020 and then take 1 tablet (200 mg) once daily from 08/18/2020 onwards as per cardiology 08/08/20  Yes Glade Lloyd, MD  apixaban (ELIQUIS) 5 MG TABS tablet Take 1 tablet (5 mg total) by mouth 2 (two) times daily. 08/08/20  Yes Glade Lloyd, MD  FLUoxetine (PROZAC) 20 MG capsule Take 20 mg by mouth at bedtime.   Yes [provider]  folic acid (FOLVITE) 1 MG tablet Take 1 tablet (1 mg total) by mouth daily. 08/09/20  Yes Glade Lloyd, MD  furosemide (LASIX) 80 MG tablet Take 1 tablet (80 mg total) by mouth 2 (two) times daily. 08/08/20  Yes Glade Lloyd, MD  ipratropium-albuterol (DUONEB) 0.5-2.5 (3) MG/3ML SOLN Use 1 vial by nebulization every 6 (six) hours as needed. Patient taking differently: Take 3 mLs by nebulization every 6 (six) hours as needed (shortness of breath/wheezing). 08/08/20  Yes Glade Lloyd, MD  losartan (COZAAR) 50 MG tablet Take 1 tablet (50 mg total) by mouth daily. 08/09/20  Yes Glade Lloyd, MD  lovastatin (MEVACOR) 20 MG tablet Take 20 mg by mouth daily.   Yes [provider]  metoprolol succinate (TOPROL-XL) 100 MG 24 hr tablet Take 1 tablet (100 mg total) by mouth daily. Take with or immediately following a meal. 08/09/20  Yes Alekh, Kshitiz, MD  mometasone-formoterol (DULERA) 100-5 MCG/ACT AERO Inhale 2 puffs into the lungs 2 (two) times daily. 08/08/20  Yes Glade Lloyd, MD  montelukast (SINGULAIR) 10 MG tablet Take 10 mg by mouth daily.   Yes [provider]  Multiple Vitamin (MULTIVITAMIN WITH MINERALS) TABS tablet Take 1 tablet by mouth daily. 08/09/20  Yes Glade Lloyd, MD  potassium chloride SA (KLOR-CON) 20 MEQ tablet Take 1 tablet (20 mEq total) by mouth 2 (two) times daily. 08/08/20  Yes Glade Lloyd, MD  terazosin (  HYTRIN) 5 MG capsule Take 5 mg by mouth daily.   Yes [provider]  thiamine 100 MG tablet Take 1 tablet (100 mg total) by mouth daily. 08/09/20  Yes Glade Lloyd, MD    Physical Exam: Vitals:   08/14/20 1615 08/14/20 1630 08/14/20 1645 08/14/20 1700  BP: (!) 138/96 (!) 135/92 137/90 117/85  Pulse: 98 93 85 89  Resp: 20 20 20 20   Temp:      TempSrc:      SpO2: 94% 96% 96% 97%     General:  Appears calm and comfortable and is in NAD Cardiovascular: Regular rate with irregular rhythm. Respiratory:   CTA bilaterally with no wheezes/rales/rhonchi.  Normal respiratory effort. Abdomen:  soft, NT, ND, NABS Skin:  no rash or induration seen on limited exam Musculoskeletal:  grossly normal tone BUE/BLE, good ROM, no bony abnormality Lower extremity:  No LE edema.  Limited foot exam with no ulcerations.  2+ distal pulses. Psychiatric:  grossly normal mood and affect, speech fluent and appropriate, AOx3 Neurologic:  CN 2-12 grossly intact, moves all extremities in coordinated fashion, sensation intact    Radiological Exams on Admission: Independently reviewed - see discussion in A/P where applicable  CT HEAD WO CONTRAST ( )  Result Date: 08/14/2020 CLINICAL DATA:  Delirium EXAM: CT HEAD WITHOUT CONTRAST  TECHNIQUE: Contiguous axial images were obtained from the base of the skull through the vertex without intravenous contrast. COMPARISON:  08/11/2020 FINDINGS: Brain: Brain atrophy with some frontal predominance as seen previously. No sign of old or acute focal infarction, mass lesion, hemorrhage, hydrocephalus or extra-axial collection. Vascular: There is atherosclerotic calcification of the major vessels at the base of the brain. Skull: Negative Sinuses/Orbits: Clear/normal Other: None IMPRESSION: No acute finding. No change since the previous study. Atrophy with some frontal predominance. Electronically Signed   By: 10/11/2020 M.D.   On: 08/14/2020 11:39   CT ABDOMEN PELVIS W CONTRAST  Result Date: 08/14/2020 CLINICAL DATA:  Epigastric pain, found down. EXAM: CT ABDOMEN AND PELVIS WITH CONTRAST TECHNIQUE: Multidetector CT imaging of the abdomen and pelvis was performed using the standard protocol following bolus administration of intravenous contrast. CONTRAST:  10/14/2020 OMNIPAQUE IOHEXOL 300 MG/ML  SOLN COMPARISON:  CT chest 07/28/2020. FINDINGS: Lower chest: Partially loculated moderate right pleural effusion with compressive atelectasis in the right lower lobe. Hepatobiliary: Liver is decreased in attenuation diffusely. Low-attenuation lesions in the liver measure up to 5.2 cm in the right hepatic lobe, compatible with cysts. Small blushes of hyperattenuation in the periphery of the right hepatic lobe are too small to characterize. Liver and gallbladder are otherwise unremarkable. No biliary ductal dilatation. Pancreas: Negative. Spleen: Negative. Adrenals/Urinary Tract: Nodular thickening of both adrenal glands. Low-attenuation lesions in the kidneys measure up to 4.9 cm in the lower pole and are compatible with cysts. Subcentimeter low-attenuation lesions in the kidneys are too small to definitively characterize. Ureters are decompressed. Bladder is unremarkable. Stomach/Bowel: Stomach, small bowel,  appendix and colon are unremarkable. Vascular/Lymphatic: Atherosclerotic calcification of the aorta. No pathologically enlarged lymph nodes. Reproductive: Prostate is visualized. Other: Small bilateral inguinal hernias contain fat. No free fluid. Mesenteries and peritoneum are unremarkable. Musculoskeletal: Degenerative changes in the spine. 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 No worrisome lytic or sclerotic lesions. IMPRESSION: 1. No acute findings to explain the patient's epigastric pain. 2. Partially loculated moderate right pleural effusion, similar to decreased from 07/28/2020. 3. Hepatic steatosis. Blushes of hyperattenuation in the right  hepatic lobe are too small to characterize. If further evaluation is desired, non emergent outpatient MR abdomen without and with contrast is recommended. 4.  Aortic atherosclerosis (ICD10-I70.0). Electronically Signed   By: Leanna Battles M.D.   On: 08/14/2020 14:33   DG Chest Portable 1 View  Result Date: 08/14/2020 CLINICAL DATA:  Altered mental status EXAM: PORTABLE CHEST 1 VIEW COMPARISON:  08/11/2020 FINDINGS: Persistent right pleural effusion and adjacent atelectasis. Stable cardiomediastinal contours. Chronic right rib fractures. IMPRESSION: Persistent right pleural effusion and adjacent atelectasis. Electronically Signed   By: Guadlupe Spanish M.D.   On: 08/14/2020 10:58   VAS US CAROTID  Result Date: 08/14/2020 Carotid Arterial Duplex Study Patient Name:  DAVARIOUS TUMBLESON  Date of Exam:   08/14/2020 Medical Rec #: [id number]              Accession #:    [id number] Date of Birth: 1950/07/23              Patient Gender: M Patient Age:   49 years Exam Location:  [facility] Procedure:      VAS US CAROTID Referring Phys: Lynwood Dawley --------------------------------------------------------------------------------  Indications:       Syncope. Risk Factors:      Hypertension, hyperlipidemia. Comparison Study:  no  prior Performing Technologist: Argentina Ponder RVS  Examination Guidelines: A complete evaluation includes B-mode imaging, spectral Doppler, color Doppler, and power Doppler as needed of all accessible portions of each vessel. Bilateral testing is considered an integral part of a complete examination. Limited examinations for reoccurring indications may be performed as noted.  Right Carotid Findings: +----------+--------+--------+--------+------------------+--------+           PSV cm/sEDV cm/sStenosisPlaque DescriptionComments +----------+--------+--------+--------+------------------+--------+ CCA Prox  65      10              heterogenous               +----------+--------+--------+--------+------------------+--------+ CCA Distal88      14              heterogenous               +----------+--------+--------+--------+------------------+--------+ ICA Prox  64      17      1-39%   heterogenous               +----------+--------+--------+--------+------------------+--------+ ICA Distal58      19                                         +----------+--------+--------+--------+------------------+--------+ ECA       44                                                 +----------+--------+--------+--------+------------------+--------+ +----------+--------+-------+--------+-------------------+           PSV cm/sEDV cmsDescribeArm Pressure (mmHG) +----------+--------+-------+--------+-------------------+ [id number]                                         +----------+--------+-------+--------+-------------------+ +---------+--------+--+--------+--+---------+ VertebralPSV cm/s51EDV cm/s14Antegrade +---------+--------+--+--------+--+---------+  Left Carotid Findings: +----------+--------+--------+--------+------------------+--------+           PSV cm/sEDV cm/sStenosisPlaque DescriptionComments +----------+--------+--------+--------+------------------+--------+  CCA Prox  57  14              heterogenous               +----------+--------+--------+--------+------------------+--------+ CCA Distal67      21              heterogenous               +----------+--------+--------+--------+------------------+--------+ ICA Prox  54      23      1-39%   heterogenous               +----------+--------+--------+--------+------------------+--------+ ICA Distal65      27                                         +----------+--------+--------+--------+------------------+--------+ ECA       66                                                 +----------+--------+--------+--------+------------------+--------+ +----------+--------+--------+--------+-------------------+           PSV cm/sEDV cm/sDescribeArm Pressure (mmHG) +----------+--------+--------+--------+-------------------+ [id number]                                          +----------+--------+--------+--------+-------------------+ +---------+--------+--+--------+--+---------+ VertebralPSV cm/s29EDV cm/s12Antegrade +---------+--------+--+--------+--+---------+   Summary: Right Carotid: Velocities in the right ICA are consistent with a 1-39% stenosis. Left Carotid: Velocities in the left ICA are consistent with a 1-39% stenosis. Vertebrals: Bilateral vertebral arteries demonstrate antegrade flow. *See table(s) above for measurements and observations.  Electronically signed by Sherald Hesshristopher Clark MD on 08/14/2020 at 4:29:06 PM.    Final     EKG: Independently reviewed.  Atrial fibrillation with rate 101   Labs on Admission: I have personally reviewed the available labs and imaging studies at the time of the admission.  Pertinent labs: Sodium 134, chloride 97, BUN 35, creatinine 1.26; VBG pH 7.469, PCO2 41, PO2 133; chest x-ray loculated right pleural effusion.     Assessment/Plan: Syncope: Admit to the medical/surgical floor under observation status.  Continue  telemetry.  The patient recently had a transesophageal echocardiogram.  We will obtain carotid duplex ultrasound.  We will obtain orthostatic vital signs.  Obtain EEG and MRI of the brain.  Consult neurology.  Acute metabolic encephalopathy: Unclear etiology.  We will consult neurology.  Consider Wernicke encephalopathy?  We will start thiamine IV.  Tobacco dependence: Counseled on smoking cessation.  Atrial fibrillation: Currently in A. fib on EKG but rate controlled.  Continue home amiodarone 200 mg twice daily and Eliquis 5 mg twice daily  CHF: Not in acute decompensation: Mild acute kidney injury hold Lasix 80 mg twice daily for 1 day.  Repeat BMP in the morning.  Continue home Toprol-XL 100 mg daily  Hyperlipidemia: Continue home lovastatin 20 mg daily  Hypertension: Hold home losartan 50 mg daily due to mild acute kidney injury  BPH: Continue home Hytrin 5 mg daily  Depression: Continue home Prozac 20 mg daily   Level of Care: MedSurg DVT prophylaxis: Eliquis Code Status: Full code Consults: Neurology Admission status: Observation   Verdia KubaShayan S Emeree Mahler DO [facility]   How to contact the [facility] Attending or Consulting  provider 7A - 7P or covering provider during after hours 7P -7A, for this patient?  Check the care team in [facility] and look for a) attending/consulting [facility] provider listed and b) the [facility] team listed Log into [email or web address] and use [facility] universal password to access. If you do not have the password, please contact the hospital operator. Locate the [facility] provider you are looking for under [facility] and page to a number that you can be directly reached. If you still have difficulty reaching the provider, please page the [facility] (Director on Call) for the Hospitalists listed on amion for assistance.   08/14/2020, 6:22 PM

## 2020-08-14 NOTE — Progress Notes (Signed)
Carotid duplex has been completed.   Preliminary results in CV Proc.   Blanch Media 08/14/2020 4:27 PM

## 2020-08-14 NOTE — ED Notes (Signed)
Pt in MRI.

## 2020-08-14 NOTE — ED Notes (Signed)
EDPA at [facility], labs sent.

## 2020-08-14 NOTE — ED Notes (Signed)
Assisted to [facility], some stool noted.

## 2020-08-14 NOTE — ED Notes (Signed)
Daughter leaving BS. MRI called for pt. Pt up to [facility] with assist.

## 2020-08-14 NOTE — ED Notes (Signed)
Staffing notified of sitter need 

## 2020-08-14 NOTE — ED Notes (Addendum)
Brought back from MRI, unable to do study, was moving too much, he also went into the b/r and wouldn't come out, staff described behavior as ornery, he is A&O to questions, but still confused and semi cooperative. Attending messaged. Admission med orders received, pending verification by pharm.

## 2020-08-14 NOTE — ED Notes (Addendum)
Pt alert, NAD, calm, interactive, resps e/u, tachypneic, no changes, denies sx or complaints, family at [facility], updated, vascular team arrives to [facility] for carotid study. Pending admission, MRI and EEG.

## 2020-08-14 NOTE — ED Provider Notes (Signed)
[facility] EMERGENCY DEPARTMENT Provider Note   CSN: [id number] Arrival date & time: 08/14/20  1005     History Chief Complaint  Patient presents with   Altered Mental Status    Samuel Barnes is a 70 y.o. male.  Patient is a 70 year old male with past medical history as listed below presenting for altered mental status.  Per patient's daughter, patient was found on outside deck this morning with altered mental status. She states it is not unusual for him to go outside to smoke in the morning but when she found him he was just sitting there and not responding verbally. She states he then placed his head down on the table and didn't respond for 30 minutes until EMS arrived. States he has had AMS since diacharge from hospital on 08/08/2020 when he was admitted for afib with hypoxia and chf. States they presented to ED for AMS 3 days ago on 08/11/2020 and was discharged after negative workup.  At this time, patient is alert and oriented x3, recall events prior to hospitalization.  Denies any history of falls or head trauma.  History of fever, chills, nausea, vomiting, diarrhea.  Admits to history of prior gall abuse but states he has not been drinking lately.  Unable to recall time of last alcohol intake. Daughter states she removed alcohol from the home 2.5 weeks ago. States he has no access to alcohol, no vehicle, and lives alone.   The history is provided by the patient. No language interpreter was used.  Altered Mental Status Presenting symptoms: confusion   Severity:  Moderate Most recent episode:  More than 2 days ago Episode history:  Multiple Associated symptoms: no abdominal pain, no fever, no palpitations, no rash, no seizures and no vomiting   Loss of Consciousness Episode history:  Single Most recent episode:  Today Duration:  30 minutes Associated symptoms: confusion   Associated symptoms: no chest pain, no fever, no palpitations, no seizures, no shortness  of breath and no vomiting       Past Medical History:  Diagnosis Date   Aortic atherosclerosis (HCC) 07/28/2020   BPH (benign prostatic hyperplasia)    Class 2 obesity 07/28/2020   Coronary artery calcification 07/28/2020   Depression    Family history of adverse reaction to anesthesia    Hyperlipidemia 07/28/2020   Hypertension    Tobacco use 07/28/2020    Patient Active Problem List   Diagnosis Date Noted   Syncope 08/14/2020   Hyperglycemia 07/29/2020   New onset a-fib [facility])    Acute congestive heart failure (HCC)    Acute respiratory failure with hypoxia (HCC) 07/28/2020   Pericardial effusion 07/28/2020   Aortic atherosclerosis (HCC) 07/28/2020   Coronary artery calcification 07/28/2020   Class 2 obesity 07/28/2020   Hyperlipidemia 07/28/2020   Tobacco use 07/28/2020   Prolonged QT interval 07/28/2020   Hypocalcemia 07/28/2020   Alcohol abuse 07/28/2020   Fatty liver 07/28/2020   BPH (benign prostatic hyperplasia)    Depression    Hypertension    Hyponatremia    Hypomagnesemia    New onset atrial fibrillation [facility])     Past Surgical History:  Procedure Laterality Date   CARDIOVERSION N/A 08/02/2020   Procedure: CARDIOVERSION;  Surgeon: Meriam Sprague, MD;  Location: [facility] ENDOSCOPY;  Service: Cardiovascular;  Laterality: N/A;   IR THORACENTESIS ASP PLEURAL SPACE W/IMG GUIDE  07/31/2020   TEE WITHOUT CARDIOVERSION N/A 08/02/2020   Procedure: TRANSESOPHAGEAL ECHOCARDIOGRAM (TEE);  Surgeon: Laurance Flatten  E, MD;  Location: [facility] ENDOSCOPY;  Service: Cardiovascular;  Laterality: N/A;   TRANSURETHRAL RESECTION OF PROSTATE         Family History  Problem Relation Age of Onset   Coronary artery disease Mother    Hypertension Mother    Hyperlipidemia Mother    Ovarian cancer Mother    Ovarian cancer Sister    Liver cancer Sister    Liver cancer Brother     Social History   Tobacco Use   Smoking status: Every Day    Types: Cigarettes   Smokeless  tobacco: Never  Vaping Use   Vaping Use: Never used  Substance Use Topics   Alcohol use: Yes    Alcohol/week: 1.0 standard drink    Types: 1 Shots of liquor per week    Comment: 3-4 times a week   Drug use: Never    Home Medications Prior to Admission medications   Medication Sig Start Date End Date Taking? Authorizing Provider  albuterol (VENTOLIN HFA) 108 (90 Base) MCG/ACT inhaler Inhale 2 puffs into the lungs every 4 (four) hours as needed for wheezing or shortness of breath. 08/08/20  Yes Glade Lloyd, MD  amiodarone (PACERONE) 200 MG tablet Take 1 tablet (200 mg) by mouth twice daily till 08/17/2020 and then take 1 tablet (200 mg) once daily from 08/18/2020 onwards as per cardiology 08/08/20  Yes Glade Lloyd, MD  apixaban (ELIQUIS) 5 MG TABS tablet Take 1 tablet (5 mg total) by mouth 2 (two) times daily. 08/08/20  Yes Glade Lloyd, MD  FLUoxetine (PROZAC) 20 MG capsule Take 20 mg by mouth at bedtime.   Yes [provider]  folic acid (FOLVITE) 1 MG tablet Take 1 tablet (1 mg total) by mouth daily. 08/09/20  Yes Glade Lloyd, MD  furosemide (LASIX) 80 MG tablet Take 1 tablet (80 mg total) by mouth 2 (two) times daily. 08/08/20  Yes Glade Lloyd, MD  ipratropium-albuterol (DUONEB) 0.5-2.5 (3) MG/3ML SOLN Use 1 vial by nebulization every 6 (six) hours as needed. Patient taking differently: Take 3 mLs by nebulization every 6 (six) hours as needed (shortness of breath/wheezing). 08/08/20  Yes Glade Lloyd, MD  losartan (COZAAR) 50 MG tablet Take 1 tablet (50 mg total) by mouth daily. 08/09/20  Yes Glade Lloyd, MD  lovastatin (MEVACOR) 20 MG tablet Take 20 mg by mouth daily.   Yes [provider]  metoprolol succinate (TOPROL-XL) 100 MG 24 hr tablet Take 1 tablet (100 mg total) by mouth daily. Take with or immediately following a meal. 08/09/20  Yes Alekh, Kshitiz, MD  mometasone-formoterol (DULERA) 100-5 MCG/ACT AERO Inhale 2 puffs into the lungs 2 (two) times daily. 08/08/20   Yes Glade Lloyd, MD  montelukast (SINGULAIR) 10 MG tablet Take 10 mg by mouth daily.   Yes [provider]  Multiple Vitamin (MULTIVITAMIN WITH MINERALS) TABS tablet Take 1 tablet by mouth daily. 08/09/20  Yes Glade Lloyd, MD  potassium chloride SA (KLOR-CON) 20 MEQ tablet Take 1 tablet (20 mEq total) by mouth 2 (two) times daily. 08/08/20  Yes Glade Lloyd, MD  terazosin (HYTRIN) 5 MG capsule Take 5 mg by mouth daily.   Yes [provider]  thiamine 100 MG tablet Take 1 tablet (100 mg total) by mouth daily. 08/09/20  Yes Glade Lloyd, MD    Allergies    Ciprofloxacin and Gabapentin  Review of Systems   Review of Systems  Constitutional:  Negative for chills and fever.  HENT:  Negative for ear pain  and sore throat.   Eyes:  Negative for pain and visual disturbance.  Respiratory:  Negative for cough and shortness of breath.   Cardiovascular:  Positive for syncope. Negative for chest pain and palpitations.  Gastrointestinal:  Negative for abdominal pain and vomiting.  Genitourinary:  Negative for dysuria and hematuria.  Musculoskeletal:  Negative for arthralgias and back pain.  Skin:  Negative for color change and rash.  Neurological:  Positive for syncope. Negative for seizures.  Psychiatric/Behavioral:  Positive for confusion.   All other systems reviewed and are negative.  Physical Exam Updated Vital Signs BP 125/88   Pulse (!) 101   Temp (!) 97.3 F (36.3 C)   Resp 18   SpO2 96%   Physical Exam Vitals and nursing note reviewed.  Constitutional:      Appearance: He is well-developed.  HENT:     Head: Normocephalic and atraumatic.  Eyes:     Conjunctiva/sclera: Conjunctivae normal.  Cardiovascular:     Rate and Rhythm: Normal rate and regular rhythm.     Heart sounds: No murmur heard. Pulmonary:     Effort: Pulmonary effort is normal. No respiratory distress.     Breath sounds: Normal breath sounds.  Abdominal:     Palpations: Abdomen is soft.      Tenderness: There is no abdominal tenderness.  Musculoskeletal:     Cervical back: Neck supple.  Skin:    General: Skin is warm and dry.  Neurological:     Mental Status: He is alert.     GCS: GCS eye subscore is 4. GCS verbal subscore is 5. GCS motor subscore is 6.     Sensory: Sensation is intact.     Motor: Motor function is intact.    ED Results / Procedures / Treatments   Labs (all labs ordered are listed, but only abnormal results are displayed) Labs Reviewed  CBC WITH DIFFERENTIAL/PLATELET - Abnormal; Notable for the following components:      Result Value   Neutro Abs 8.6 (*)    All other components within normal limits  BASIC METABOLIC PANEL - Abnormal; Notable for the following components:   Sodium 134 (*)    Chloride 97 (*)    Glucose, Bld 130 (*)    BUN 35 (*)    Creatinine, Ser 1.26 (*)    All other components within normal limits  HEPATIC FUNCTION PANEL - Abnormal; Notable for the following components:   AST 50 (*)    ALT 91 (*)    Alkaline Phosphatase 134 (*)    Total Bilirubin 1.3 (*)    Bilirubin, Direct 0.4 (*)    All other components within normal limits  LACTIC ACID, PLASMA - Abnormal; Notable for the following components:   Lactic Acid, Venous <0.3 (*)    All other components within normal limits  BRAIN NATRIURETIC PEPTIDE - Abnormal; Notable for the following components:   B Natriuretic Peptide 135.2 (*)    All other components within normal limits  ACETAMINOPHEN LEVEL - Abnormal; Notable for the following components:   Acetaminophen (Tylenol), Serum <10 (*)    All other components within normal limits  URINALYSIS, ROUTINE W REFLEX MICROSCOPIC - Abnormal; Notable for the following components:   APPearance HAZY (*)    Specific Gravity, Urine 1.036 (*)    All other components within normal limits  SALICYLATE LEVEL - Abnormal; Notable for the following components:   Salicylate Lvl <7.0 (*)    All other components within normal limits  I-STAT  VENOUS BLOOD GAS, ED - Abnormal; Notable for the following components:   pH, Ven 7.469 (*)    pCO2, Ven 41.6 (*)    pO2, Ven 133.0 (*)    Bicarbonate 30.2 (*)    Acid-Base Excess 6.0 (*)    Sodium 134 (*)    Calcium, Ion 1.14 (*)    Hemoglobin 17.3 (*)    All other components within normal limits  RESP PANEL BY RT-PCR (FLU A&B, COVID) ARPGX2  LIPASE, BLOOD  MAGNESIUM  ETHANOL  AMMONIA  BLOOD GAS, VENOUS  TROPONIN I (HIGH SENSITIVITY)    EKG None  Radiology CT HEAD WO CONTRAST ( )  Result Date: 08/14/2020 CLINICAL DATA:  Delirium EXAM: CT HEAD WITHOUT CONTRAST TECHNIQUE: Contiguous axial images were obtained from the base of the skull through the vertex without intravenous contrast. COMPARISON:  08/11/2020 FINDINGS: Brain: Brain atrophy with some frontal predominance as seen previously. No sign of old or acute focal infarction, mass lesion, hemorrhage, hydrocephalus or extra-axial collection. Vascular: There is atherosclerotic calcification of the major vessels at the base of the brain. Skull: Negative Sinuses/Orbits: Clear/normal Other: None IMPRESSION: No acute finding. No change since the previous study. Atrophy with some frontal predominance. Electronically Signed   By: Paulina Fusi M.D.   On: 08/14/2020 11:39   CT ABDOMEN PELVIS W CONTRAST  Result Date: 08/14/2020 CLINICAL DATA:  Epigastric pain, found down. EXAM: CT ABDOMEN AND PELVIS WITH CONTRAST TECHNIQUE: Multidetector CT imaging of the abdomen and pelvis was performed using the standard protocol following bolus administration of intravenous contrast. CONTRAST:  OMNIPAQUE IOHEXOL 300 MG/ML  SOLN COMPARISON:  CT chest 07/28/2020. FINDINGS: Lower chest: Partially loculated moderate right pleural effusion with compressive atelectasis in the right lower lobe. Hepatobiliary: Liver is decreased in attenuation diffusely. Low-attenuation lesions in the liver measure up to 5.2 cm in the right hepatic lobe, compatible with cysts.  Small blushes of hyperattenuation in the periphery of the right hepatic lobe are too small to characterize. Liver and gallbladder are otherwise unremarkable. No biliary ductal dilatation. Pancreas: Negative. Spleen: Negative. Adrenals/Urinary Tract: Nodular thickening of both adrenal glands. Low-attenuation lesions in the kidneys measure up to 4.9 cm in the lower pole and are compatible with cysts. Subcentimeter low-attenuation lesions in the kidneys are too small to definitively characterize. Ureters are decompressed. Bladder is unremarkable. Stomach/Bowel: Stomach, small bowel, appendix and colon are unremarkable. Vascular/Lymphatic: Atherosclerotic calcification of the aorta. No pathologically enlarged lymph nodes. Reproductive: Prostate is visualized. Other: Small bilateral inguinal hernias contain fat. No free fluid. Mesenteries and peritoneum are unremarkable. Musculoskeletal: Degenerative changes in the spine. 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 No worrisome lytic or sclerotic lesions. IMPRESSION: 1. No acute findings to explain the patient's epigastric pain. 2. Partially loculated moderate right pleural effusion, similar to decreased from 07/28/2020. 3. Hepatic steatosis. Blushes of hyperattenuation in the right hepatic lobe are too small to characterize. If further evaluation is desired, non emergent outpatient MR abdomen without and with contrast is recommended. 4.  Aortic atherosclerosis (ICD10-I70.0). Electronically Signed   By: Leanna Battles M.D.   On: 08/14/2020 14:33   DG Chest Portable 1 View  Result Date: 08/14/2020 CLINICAL DATA:  Altered mental status EXAM: PORTABLE CHEST 1 VIEW COMPARISON:  08/11/2020 FINDINGS: Persistent right pleural effusion and adjacent atelectasis. Stable cardiomediastinal contours. Chronic right rib fractures. IMPRESSION: Persistent right pleural effusion and adjacent atelectasis. Electronically Signed   By: Jackquline Berlin.D.  On: 08/14/2020 10:58    Procedures Procedures   Medications Ordered in ED Medications  iohexol (OMNIPAQUE) 300 MG/ML solution 100 mL (100 mLs Intravenous Contrast Given 08/14/20 1402)    ED Course  I have reviewed the triage vital signs and the nursing notes.  Pertinent labs & imaging results that were available during my care of the patient were reviewed by me and considered in my medical decision making (see chart for details).    MDM Rules/Calculators/A&P                           70 year old male with past medical history as listed below presenting for altered mental status.  On exam patient is alert and oriented x3, no acute distress, afebrile, stable vital signs.   EKG stable with no STEMI or NSTEMI. Stable troponin, bnp, and cxr.   AMS: -Patient currently Aox3. No facial asymmetry, abnormal speech, motor or sensory deficits.  Low suspicion stroke. --CT head demonstrates no acute process -No hypoxia. On 2 L home oxygen. Stable VBG, no hypercarbia.  -Hx of alcohol abuse. Epigastric tenderness on exam. Stable liver profile and lipase. CT abdomen demonstrates no acute process.  -Stable ammonia -Ethanol, salicylate, acetaminophen negative -Urine analysis demonstrates no UTI. -No alcohol access for 2.5 weeks-doubt withdrawal.  -Family denies shaking, stiffness, eye deviation, incontinence, or tongue biting. Less likely seizure.   Patient recommended for admission for further workup of AMS and/or syncope. I spoke with admitting provider Dr. Linna DarnerAnwar who agrees to accept patient.         Final Clinical Impression(s) / ED Diagnoses Final diagnoses:  Delirium  Syncope, unspecified syncope type    Rx / DC Orders ED Discharge Orders     None        Franne FortsGray, Harinder Romas P, DO 08/14/20 1629

## 2020-08-14 NOTE — ED Notes (Signed)
Irritable, increasingly more easily frustrated with questions, refers to recent facts as lies.

## 2020-08-14 NOTE — ED Triage Notes (Signed)
BIB [facility] from home, daughter called EMS, pt lives alone, found on outside deck off of home O2, brought inside and was given and eating cereal then stopped. H/o ETOH abuse, afib, and COPD. On 2L home O2. Afib 110, cbg 139. Recent admission with dx of possible dementia. Just started eliquis. Unsure of baseline. Oriented to self, follows commands, alert, NAD, calm, amiable, interactive.

## 2020-08-14 NOTE — ED Notes (Signed)
Heading upstairs to give bedside report, no changes.

## 2020-08-14 NOTE — ED Notes (Signed)
Family to BS

## 2020-08-14 NOTE — Progress Notes (Signed)
Unable to proceed with MRI. Pt sat on toilet for about 20 mins. He would stand and sit back down. He would say he was done and when we would try to get him to come out he would start to raise his voice and say he needed to use the bathroom. Finally able to get him out of bathroom and pt would not lay still, was lifting head up and moving legs. Pt taken back to ED

## 2020-08-15 ENCOUNTER — Observation Stay (HOSPITAL_COMMUNITY): Payer: Medicare Other

## 2020-08-15 DIAGNOSIS — R410 Disorientation, unspecified: Secondary | ICD-10-CM | POA: Diagnosis not present

## 2020-08-15 DIAGNOSIS — R55 Syncope and collapse: Secondary | ICD-10-CM | POA: Diagnosis not present

## 2020-08-15 DIAGNOSIS — I4891 Unspecified atrial fibrillation: Secondary | ICD-10-CM | POA: Diagnosis not present

## 2020-08-15 LAB — COMPREHENSIVE METABOLIC PANEL
ALT: 76 U/L — ABNORMAL HIGH (ref 0–44)
AST: 38 U/L (ref 15–41)
Albumin: 3.3 g/dL — ABNORMAL LOW (ref 3.5–5.0)
Alkaline Phosphatase: 125 U/L (ref 38–126)
Anion gap: 6 (ref 5–15)
BUN: 34 mg/dL — ABNORMAL HIGH (ref 8–23)
CO2: 29 mmol/L (ref 22–32)
Calcium: 9.3 mg/dL (ref 8.9–10.3)
Chloride: 99 mmol/L (ref 98–111)
Creatinine, Ser: 0.97 mg/dL (ref 0.61–1.24)
GFR, Estimated: >60 mL/min (ref >60)
Glucose, Bld: 106 mg/dL — ABNORMAL HIGH (ref 70–99)
Potassium: 4.2 mmol/L (ref 3.5–5.1)
Sodium: 134 mmol/L — ABNORMAL LOW (ref 135–145)
Total Bilirubin: 1.2 mg/dL (ref 0.3–1.2)
Total Protein: 6.5 g/dL (ref 6.5–8.1)

## 2020-08-15 LAB — BLOOD GAS, VENOUS
Acid-Base Excess: 3.3 mmol/L — ABNORMAL HIGH (ref 0.0–2.0)
Bicarbonate: 27.8 mmol/L (ref 20.0–28.0)
FIO2: 28
O2 Saturation: 83 %
Patient temperature: 36.9
pCO2, Ven: 45.6 mmHg (ref 44.0–60.0)
pH, Ven: 7.402 (ref 7.250–7.430)
pO2, Ven: 50.4 mmHg — ABNORMAL HIGH (ref 32.0–45.0)

## 2020-08-15 LAB — CBC
HCT: 46.5 % (ref 39.0–52.0)
Hemoglobin: 15.5 g/dL (ref 13.0–17.0)
MCH: 32.6 pg (ref 26.0–34.0)
MCHC: 33.3 g/dL (ref 30.0–36.0)
MCV: 97.7 fL (ref 80.0–100.0)
Platelets: 308 10*3/uL (ref 150–400)
RBC: 4.76 MIL/uL (ref 4.22–5.81)
RDW: 12.8 % (ref 11.5–15.5)
WBC: 8.8 10*3/uL (ref 4.0–10.5)
nRBC: 0 % (ref 0.0–0.2)

## 2020-08-15 LAB — VITAMIN B12: Vitamin B-12: 317 pg/mL (ref 180–914)

## 2020-08-15 MED ORDER — SODIUM CHLORIDE 0.9 % IV SOLN
2000.0000 mg | INTRAVENOUS | Status: AC
  Administered 2020-08-15: 2000 mg via INTRAVENOUS
  Filled 2020-08-15: qty 20

## 2020-08-15 MED ORDER — LORAZEPAM 2 MG/ML IJ SOLN
1.0000 mg | Freq: Once | INTRAMUSCULAR | Status: AC
  Administered 2020-08-15: 1 mg via INTRAVENOUS
  Filled 2020-08-15: qty 1

## 2020-08-15 MED ORDER — MOMETASONE FURO-FORMOTEROL FUM 100-5 MCG/ACT IN AERO
2.0000 | INHALATION_SPRAY | Freq: Two times a day (BID) | RESPIRATORY_TRACT | Status: DC
  Administered 2020-08-16 – 2020-08-21 (×11): 2 via RESPIRATORY_TRACT

## 2020-08-15 NOTE — Progress Notes (Signed)
[facility]  PROGRESS NOTE  Samuel Barnes ZOX:[id number] DOB: 07/06/1950 DOA: 08/14/2020 PCP: Pcp, No   Brief HPI:   70 year old male with medical history of CHF with chronic hypoxemic respiratory failure on 3 L/min oxygen at baseline, atrial fibrillation on Eliquis, BPH, CAD, hyperlipidemia, tobacco use, obesity, BPH, depression presented to the ED after he had syncopal episode at home.  Daughter states that he requires a lot of prompting to do tasks now.  Difficulty using a fork at home to eat himself.  He has been also more forgetful lately.  Does recognize family.  He has been dropping things more often.  He did have a history of alcohol abuse but has not been drinking for the past few weeks per daughter.  Approximately 3 weeks she states. She states that he has unsteady gait as well.     Subjective   Patient seen and examined, continues to be pleasantly confused.   Assessment/Plan:     Syncope -Patient had episode of unresponsiveness -Continue monitoring on telemetry -Patient had TEE on 08/02/2020, which showed EF of 55 to 60%, no left atrial appendage thrombus detected.  Patient had DCCV performed after TEE -MRI brain and EEG ordered  Encephalopathy -Patient has been having cognitive decline for past few weeks -CT head is unremarkable -MRI brain ordered as above -Started on IV thiamine -We will check serum B12, TSH on 7/22 was 1.920 -Neurology has been consulted  Atrial fibrillation -EKG from 08/14/2020 showed atrial fibrillation -Heart rate controlled with metoprolol -Continue amiodarone -Continue anticoagulation with apixaban  Hypertension -Losartan is on hold, BP is soft -Continue metoprolol as above  Chronic diastolic CHF Lasix currently on hold    Scheduled medications:    amiodarone  200 mg Oral BID   apixaban  5 mg Oral BID   FLUoxetine  20 mg Oral QHS   folic acid  1 mg Oral Daily   LORazepam  1 mg Intravenous Once   metoprolol succinate   100 mg Oral Daily   mometasone-formoterol  2 puff Inhalation BID   montelukast  10 mg Oral Daily   multivitamin with minerals  1 tablet Oral Daily   potassium chloride SA  20 mEq Oral BID   pravastatin  20 mg Oral q1800   terazosin  5 mg Oral Daily         Data Reviewed:   CBG:  No results for input(s): GLUCAP in the last 168 hours.  SpO2: 97 % O2 Flow Rate (L/min): 2 L/min FiO2 (%): 100 %    Vitals:   08/15/20 0447 08/15/20 0833 08/15/20 0900 08/15/20 1154  BP:   (!) 146/99 110/90  Pulse:   (!) 102 96  Resp:   17 18  Temp:   98.5 F (36.9 C) 98.7 F (37.1 C)  TempSrc:   Oral Oral  SpO2:  96% 97% 97%  Weight: 84.4 kg     Height:         Intake/Output Summary (Last 24 hours) at 08/15/2020 1637 Last data filed at 08/15/2020 0448 Gross per 24 hour  Intake --  Output 400 ml  Net -400 ml    08/07 1901 - 08/09 0700 In: -  Out: 400 [Urine:400]  Filed Weights   08/14/20 2240 08/15/20 0447  Weight: 85.4 kg 84.4 kg    CBC:  Recent Labs  Lab 08/11/20 1235 08/14/20 1025 08/14/20 1114 08/15/20 0600  WBC 8.5 10.2  --  8.8  HGB 15.4 16.4 17.3* 15.5  HCT  45.1 50.1 51.0 46.5  PLT 325 339  --  308  MCV 95.6 97.5  --  97.7  MCH 32.6 31.9  --  32.6  MCHC 34.1 32.7  --  33.3  RDW 12.8 12.7  --  12.8  LYMPHSABS 0.7 0.7  --   --   MONOABS 1.0 0.8  --   --   EOSABS 0.1 0.0  --   --   BASOSABS 0.0 0.0  --   --     Complete metabolic panel:  Recent Labs  Lab 08/09/20 0309 08/11/20 1235 08/11/20 1236 08/14/20 1025 08/14/20 1114 08/15/20 0600  NA 128* 131*  --  134* 134* 134*  K 4.5 4.6  --  4.5 4.5 4.2  CL 86* 93*  --  97*  --  99  CO2 29 31  --  24  --  29  GLUCOSE 114* 87  --  130*  --  106*  BUN 20 25*  --  35*  --  34*  CREATININE 0.61 0.74  --  1.26*  --  0.97  CALCIUM 9.5 9.5  --  9.6  --  9.3  AST  --   --  43* 50*  --  38  ALT  --   --  89* 91*  --  76*  ALKPHOS  --   --  152* 134*  --  125  BILITOT  --   --  0.8 1.3*  --  1.2  ALBUMIN  --    --  3.6 3.7  --  3.3*  MG  --   --   --  2.3  --   --   LATICACIDVEN  --   --   --  <0.3*  --   --   AMMONIA  --   --   --  28  --   --   BNP  --  328.6*  --  135.2*  --   --     Recent Labs  Lab 08/14/20 1025  LIPASE 34    Recent Labs  Lab 08/11/20 1235 08/14/20 1025  BNP 328.6* 135.2*  SARSCOV2NAA NEGATIVE NEGATIVE    ------------------------------------------------------------------------------------------------------------------ No results for input(s): CHOL, HDL, LDLCALC, TRIG, CHOLHDL, LDLDIRECT in the last 72 hours.  Lab Results  Component Value Date   HGBA1C 5.7 (H) 07/30/2020   ------------------------------------------------------------------------------------------------------------------ No results for input(s): TSH, T4TOTAL, T3FREE, THYROIDAB in the last 72 hours.  Invalid input(s): FREET3 ------------------------------------------------------------------------------------------------------------------ Recent Labs    08/15/20 0600  VITAMINB12 317    Coagulation profile No results for input(s): INR, PROTIME in the last 168 hours. No results for input(s): DDIMER in the last 72 hours.  Cardiac Enzymes No results for input(s): CKTOTAL, CKMB, CKMBINDEX, TROPONINI in the last 168 hours.  ------------------------------------------------------------------------------------------------------------------    Component Value Date/Time   BNP 135.2 (H) 08/14/2020 1025     Antibiotics: Anti-infectives (From admission, onward)    None        Radiology Reports  CT HEAD WO CONTRAST ( )  Result Date: 08/14/2020 CLINICAL DATA:  Delirium EXAM: CT HEAD WITHOUT CONTRAST TECHNIQUE: Contiguous axial images were obtained from the base of the skull through the vertex without intravenous contrast. COMPARISON:  08/11/2020 FINDINGS: Brain: Brain atrophy with some frontal predominance as seen previously. No sign of old or acute focal infarction, mass lesion,  hemorrhage, hydrocephalus or extra-axial collection. Vascular: There is atherosclerotic calcification of the major vessels at the base of the brain. Skull: Negative Sinuses/Orbits: Clear/normal Other:  None IMPRESSION: No acute finding. No change since the previous study. Atrophy with some frontal predominance. Electronically Signed   By: Paulina Fusi M.D.   On: 08/14/2020 11:39   CT ABDOMEN PELVIS W CONTRAST  Result Date: 08/14/2020 CLINICAL DATA:  Epigastric pain, found down. EXAM: CT ABDOMEN AND PELVIS WITH CONTRAST TECHNIQUE: Multidetector CT imaging of the abdomen and pelvis was performed using the standard protocol following bolus administration of intravenous contrast. CONTRAST:  OMNIPAQUE IOHEXOL 300 MG/ML  SOLN COMPARISON:  CT chest 07/28/2020. FINDINGS: Lower chest: Partially loculated moderate right pleural effusion with compressive atelectasis in the right lower lobe. Hepatobiliary: Liver is decreased in attenuation diffusely. Low-attenuation lesions in the liver measure up to 5.2 cm in the right hepatic lobe, compatible with cysts. Small blushes of hyperattenuation in the periphery of the right hepatic lobe are too small to characterize. Liver and gallbladder are otherwise unremarkable. No biliary ductal dilatation. Pancreas: Negative. Spleen: Negative. Adrenals/Urinary Tract: Nodular thickening of both adrenal glands. Low-attenuation lesions in the kidneys measure up to 4.9 cm in the lower pole and are compatible with cysts. Subcentimeter low-attenuation lesions in the kidneys are too small to definitively characterize. Ureters are decompressed. Bladder is unremarkable. Stomach/Bowel: Stomach, small bowel, appendix and colon are unremarkable. Vascular/Lymphatic: Atherosclerotic calcification of the aorta. No pathologically enlarged lymph nodes. Reproductive: Prostate is visualized. Other: Small bilateral inguinal hernias contain fat. No free fluid. Mesenteries and peritoneum are unremarkable.  Musculoskeletal: Degenerative changes in the spine. 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 No worrisome lytic or sclerotic lesions. IMPRESSION: 1. No acute findings to explain the patient's epigastric pain. 2. Partially loculated moderate right pleural effusion, similar to decreased from 07/28/2020. 3. Hepatic steatosis. Blushes of hyperattenuation in the right hepatic lobe are too small to characterize. If further evaluation is desired, non emergent outpatient MR abdomen without and with contrast is recommended. 4.  Aortic atherosclerosis (ICD10-I70.0). Electronically Signed   By: Leanna Battles M.D.   On: 08/14/2020 14:33   DG Chest Portable 1 View  Result Date: 08/14/2020 CLINICAL DATA:  Altered mental status EXAM: PORTABLE CHEST 1 VIEW COMPARISON:  08/11/2020 FINDINGS: Persistent right pleural effusion and adjacent atelectasis. Stable cardiomediastinal contours. Chronic right rib fractures. IMPRESSION: Persistent right pleural effusion and adjacent atelectasis. Electronically Signed   By: Guadlupe Spanish M.D.   On: 08/14/2020 10:58   VAS US CAROTID  Result Date: 08/14/2020 Carotid Arterial Duplex Study Patient Name:  ISACC TURNEY  Date of Exam:   08/14/2020 Medical Rec #: [id number]              Accession #:    [id number] Date of Birth: [birth year]              Patient Gender: M Patient Age:   38 years Exam Location:  [facility] Procedure:      VAS US CAROTID Referring Phys: Lynwood Dawley --------------------------------------------------------------------------------  Indications:       Syncope. Risk Factors:      Hypertension, hyperlipidemia. Comparison Study:  no prior Performing Technologist: [place] Ponder RVS  Examination Guidelines: A complete evaluation includes B-mode imaging, spectral Doppler, color Doppler, and power Doppler as needed of all accessible portions of each vessel. Bilateral testing is considered an integral part of a complete  examination. Limited examinations for reoccurring indications may be performed as noted.  Right Carotid Findings: +----------+--------+--------+--------+------------------+--------+           PSV cm/sEDV cm/sStenosisPlaque DescriptionComments +----------+--------+--------+--------+------------------+--------+ CCA  Prox  65      10              heterogenous               +----------+--------+--------+--------+------------------+--------+ CCA Distal88      14              heterogenous               +----------+--------+--------+--------+------------------+--------+ ICA Prox  64      17      1-39%   heterogenous               +----------+--------+--------+--------+------------------+--------+ ICA Distal58      19                                         +----------+--------+--------+--------+------------------+--------+ ECA       44                                                 +----------+--------+--------+--------+------------------+--------+ +----------+--------+-------+--------+-------------------+           PSV cm/sEDV cmsDescribeArm Pressure (mmHG) +----------+--------+-------+--------+-------------------+ [id number]                                         +----------+--------+-------+--------+-------------------+ +---------+--------+--+--------+--+---------+ VertebralPSV cm/s51EDV cm/s14Antegrade +---------+--------+--+--------+--+---------+  Left Carotid Findings: +----------+--------+--------+--------+------------------+--------+           PSV cm/sEDV cm/sStenosisPlaque DescriptionComments +----------+--------+--------+--------+------------------+--------+ CCA Prox  57      14              heterogenous               +----------+--------+--------+--------+------------------+--------+ CCA Distal67      21              heterogenous               +----------+--------+--------+--------+------------------+--------+ ICA Prox  54       23      1-39%   heterogenous               +----------+--------+--------+--------+------------------+--------+ ICA Distal65      27                                         +----------+--------+--------+--------+------------------+--------+ ECA       66                                                 +----------+--------+--------+--------+------------------+--------+ +----------+--------+--------+--------+-------------------+           PSV cm/sEDV cm/sDescribeArm Pressure (mmHG) +----------+--------+--------+--------+-------------------+ [id number]                                          +----------+--------+--------+--------+-------------------+ +---------+--------+--+--------+--+---------+ VertebralPSV cm/s29EDV cm/s12Antegrade +---------+--------+--+--------+--+---------+   Summary: Right Carotid: Velocities in the right ICA are consistent with a 1-39% stenosis. Left Carotid: Velocities  in the left ICA are consistent with a 1-39% stenosis. Vertebrals: Bilateral vertebral arteries demonstrate antegrade flow. *See table(s) above for measurements and observations.  Electronically signed by Sherald Hesshristopher Clark MD on 08/14/2020 at 4:29:06 PM.    Final       DVT prophylaxis: Apixaban  Code Status: Full code  Family Communication: No family at bedside   Consultants:   Procedures:     Objective    Physical Examination:  General-appears in no acute distress Heart-S1-S2, regular, no murmur auscultated Lungs-clear to auscultation bilaterally, no wheezing or crackles auscultated Abdomen-soft, nontender, no organomegaly Extremities-no edema in the lower extremities Neuro-alert, oriented to self only, no focal deficit noted  Status is: Inpatient  Dispo: The patient is from: Home              Anticipated d/c is to: Home versus skilled nursing facility              Anticipated d/c date is: 08/18/2020              Patient currently not stable for  discharge  Barrier to discharge-ongoing evaluation for encephalopathy  COVID-19 Labs  No results for input(s): DDIMER, FERRITIN, LDH, CRP in the last 72 hours.  Lab Results  Component Value Date   SARSCOV2NAA NEGATIVE 08/14/2020   SARSCOV2NAA NEGATIVE 08/11/2020   SARSCOV2NAA NEGATIVE 07/28/2020    Microbiology  Recent Results (from the past 240 hour(s))  Urine Culture     Status: Abnormal   Collection Time: 08/09/20 12:55 PM   Specimen: Urine, Clean Catch  Result Value Ref Range Status   Specimen Description URINE, CLEAN CATCH  Final   Special Requests   Final    NONE Performed at [facility], [address] [place], [place] [address]    Culture MULTIPLE SPECIES PRESENT, SUGGEST RECOLLECTION (A)  Final   Report Status 08/10/2020 FINAL  Final  Resp Panel by RT-PCR (Flu A&B, Covid) Nasopharyngeal Swab     Status: None   Collection Time: 08/11/20 12:35 PM   Specimen: Nasopharyngeal Swab; Nasopharyngeal(NP) swabs in vial transport medium  Result Value Ref Range Status   SARS Coronavirus 2 by RT PCR NEGATIVE NEGATIVE Final    Comment: (NOTE) SARS-CoV-2 target nucleic acids are NOT DETECTED.  The SARS-CoV-2 RNA is generally detectable in upper respiratory specimens during the acute phase of infection. The lowest concentration of SARS-CoV-2 viral copies this assay can detect is 138 copies/mL. A negative result does not preclude SARS-Cov-2 infection and should not be used as the sole basis for treatment or other patient management decisions. A negative result may occur with  improper specimen collection/handling, submission of specimen other than nasopharyngeal swab, presence of viral mutation(s) within the areas targeted by this assay, and inadequate number of viral copies(<138 copies/mL). A negative result must be combined with clinical observations, patient history, and epidemiological information. The expected result is Negative.  Fact Sheet for Patients:   BloggerCourse.comhttps://www.fda.gov/media/152166/download  Fact Sheet for Healthcare Providers:  SeriousBroker.ithttps://www.fda.gov/media/152162/download  This test is no t yet approved or cleared by the [place] FDA and  has been authorized for detection and/or diagnosis of SARS-CoV-2 by FDA under an Emergency Use Authorization (EUA). This EUA will remain  in effect (meaning this test can be used) for the duration of the COVID-19 declaration under Section 564(b)(1) of the Act, 21 U.S.C.section 360bbb-3(b)(1), unless the authorization is terminated  or revoked sooner.       Influenza A by PCR NEGATIVE NEGATIVE Final  Influenza B by PCR NEGATIVE NEGATIVE Final    Comment: (NOTE) The Xpert Xpress SARS-CoV-2/FLU/RSV plus assay is intended as an aid in the diagnosis of influenza from Nasopharyngeal swab specimens and should not be used as a sole basis for treatment. Nasal washings and aspirates are unacceptable for Xpert Xpress SARS-CoV-2/FLU/RSV testing.  Fact Sheet for Patients: BloggerCourse.com  Fact Sheet for Healthcare Providers: SeriousBroker.it  This test is not yet approved or cleared by the [place] FDA and has been authorized for detection and/or diagnosis of SARS-CoV-2 by FDA under an Emergency Use Authorization (EUA). This EUA will remain in effect (meaning this test can be used) for the duration of the COVID-19 declaration under Section 564(b)(1) of the Act, 21 U.S.C. section 360bbb-3(b)(1), unless the authorization is terminated or revoked.  Performed at [facility], [address] [place], [place] [address]   Resp Panel by RT-PCR (Flu A&B, Covid) Nasopharyngeal Swab     Status: None   Collection Time: 08/14/20 10:25 AM   Specimen: Nasopharyngeal Swab; Nasopharyngeal(NP) swabs in vial transport medium  Result Value Ref Range Status   SARS Coronavirus 2 by RT PCR NEGATIVE NEGATIVE Final    Comment: (NOTE) SARS-CoV-2  target nucleic acids are NOT DETECTED.  The SARS-CoV-2 RNA is generally detectable in upper respiratory specimens during the acute phase of infection. The lowest concentration of SARS-CoV-2 viral copies this assay can detect is 138 copies/mL. A negative result does not preclude SARS-Cov-2 infection and should not be used as the sole basis for treatment or other patient management decisions. A negative result may occur with  improper specimen collection/handling, submission of specimen other than nasopharyngeal swab, presence of viral mutation(s) within the areas targeted by this assay, and inadequate number of viral copies(<138 copies/mL). A negative result must be combined with clinical observations, patient history, and epidemiological information. The expected result is Negative.  Fact Sheet for Patients:  BloggerCourse.com  Fact Sheet for Healthcare Providers:  SeriousBroker.it  This test is no t yet approved or cleared by the [place] FDA and  has been authorized for detection and/or diagnosis of SARS-CoV-2 by FDA under an Emergency Use Authorization (EUA). This EUA will remain  in effect (meaning this test can be used) for the duration of the COVID-19 declaration under Section 564(b)(1) of the Act, 21 U.S.C.section 360bbb-3(b)(1), unless the authorization is terminated  or revoked sooner.       Influenza A by PCR NEGATIVE NEGATIVE Final   Influenza B by PCR NEGATIVE NEGATIVE Final    Comment: (NOTE) The Xpert Xpress SARS-CoV-2/FLU/RSV plus assay is intended as an aid in the diagnosis of influenza from Nasopharyngeal swab specimens and should not be used as a sole basis for treatment. Nasal washings and aspirates are unacceptable for Xpert Xpress SARS-CoV-2/FLU/RSV testing.  Fact Sheet for Patients: BloggerCourse.com  Fact Sheet for Healthcare  Providers: SeriousBroker.it  This test is not yet approved or cleared by the [place] FDA and has been authorized for detection and/or diagnosis of SARS-CoV-2 by FDA under an Emergency Use Authorization (EUA). This EUA will remain in effect (meaning this test can be used) for the duration of the COVID-19 declaration under Section 564(b)(1) of the Act, 21 U.S.C. section 360bbb-3(b)(1), unless the authorization is terminated or revoked.  Performed at [facility], [address], [place], [place] [address]              Meredeth Ide   [facility] If 7PM-7AM, please contact  night-coverage at [email or web address], Office  [phone]   08/15/2020, 4:37 PM  LOS: 0 days

## 2020-08-15 NOTE — Procedures (Signed)
Patient Name: Samuel Barnes  MRN: [id number]  Epilepsy Attending: Charlsie Quest  Referring Physician/Provider: Dr Milon Dikes Date: 08/15/2020 Duration: 23.48 mins  Patient history: 70yo M with  4-5 days of progressive acute encephalopathy and a witnessed 30 minute episode of syncope and unresponsiveness at home. EEG to evaluate for seizure  Level of alertness: Awake  AEDs during EEG study: LEV  Technical aspects: This EEG study was done with scalp electrodes positioned according to the 10-20 International system of electrode placement. Electrical activity was acquired at a sampling rate of 500Hz  and reviewed with a high frequency filter of 70Hz  and a low frequency filter of 1Hz . EEG data were recorded continuously and digitally stored.   Description: The posterior dominant rhythm consists of 9-10 Hz activity of moderate voltage (25-35 uV) seen predominantly in posterior head regions, symmetric and reactive to eye opening and eye closing. Hyperventilation and photic stimulation were not performed.     IMPRESSION: This study is within normal limits. No seizures or epileptiform discharges were seen throughout the recording.  [name] 

## 2020-08-15 NOTE — Care Management (Addendum)
Prior to admission [facility] for home health, oxygen and NEB machine with [employer] [facility]

## 2020-08-15 NOTE — Progress Notes (Signed)
Patient arrived via stretcher to 6N room 15. Bedside report received from [place], [place]. Patient is alert and oriented to self and place. Patient is disoriented to time and situation. Bed placed in low position and patient oriented to room and call button. Will complete assessment.

## 2020-08-15 NOTE — Consult Note (Addendum)
Neurology Consultation  Reason for Consult: Acute encephalopathy, confusion Referring Physician: Dr. Sharl Ma  CC: "I have been off since then, I mean now"  History is obtained from: Patient's Barnes via telephone, patient is unable to give a reliable history at this time  HPI: Samuel Barnes is a 69 y.o. male with a medical history significant for essential hypertesnion, hyperlipidemia, tobacco use, marijuana use, ETOH abuse, atrial fibrillation on Eliquis, CAD, and BPH who was recently discharged from the hospital on 8/2 after a 1.5 week admission. During his recent admission he was diagnosed with atrial fibrillation, acute diastolic heart failure, and pleural effusion requiring supplemental home oxygen via 3L [place]. Per patient's Barnes, during the recent hospitalization patient had some confusion regarding different conversations with different providers but without significant change in mental status. He was brought back to the ED for evaluation of progressive confusion since discharge with a witnessed syncopal event at home.   At baseline Samuel Barnes is able to live independently, drive, walk without assistance, and manage all household affairs without assistance. He is able to have clear and coherent conversation with his family members via telephone and text messaging but over the past 4 - 5 days he has been progressively confused and unable to care for himself.  His Barnes first noticed that 2 - 3 days after being discharged to home, he was unable to send her text messages that make sense but sends random letters and unintelligible text. He was also unable to use the TV remote control but was able to follow step-by-step prompts. Over the last 2 days at home, Samuel Barnes was unable to use utensils to feed himself, began dropping things frequently, and noted an unsteady gait. His Barnes states that he has lost periods of time that he is unable to account for. When he is left alone  she notices that he does things out of his character such as giving his credit card information out over the phone with multiple credit cards. He has also been found at home without his oxygen multiple times by his Barnes and she states she often has to fight with him for him to keep the oxygen on. She also endorses episodes where the patient is verbally unresponsive but awake where he grimaces, clenches his fist and toes but does not answer questions. These episodes last approximately 20 - 30 minutes at a time. His Barnes states that he has a history of heavy alcohol use but because he lives independently she is unable to quantify the amount but she estimates at least 5-6 whiskey drinks daily. She took him to be evaluated on Friday 8/5 due to progressive confusion, agitation, bursts of anger, and bizarre conversations where the patient states "we need to go over there and complete the lone". He was evaluated and discharged at an OSH without any acute findings on Friday 8/5.   Yesterday on 08/14/2020, Samuel Barnes states that she was at his house feeding him breakfast. She states that while he was eating he grabbed his spoon as if he was about to take a bite and immediately slumped over and was unresponsive for approximately 30 minutes. She shook him and was yelling at him but he would not wake up. She activated EMS who also visualized patient in an unresponsive state.  When they moved him onto their stretcher he started to wake up and answer questions. He was subsequently transferred to [facility] for further evaluation.  ROS: Unable to obtain a complete ROS  due to patient's altered mental status/acute encephalopathy.   Past Medical History:  Diagnosis Date   Aortic atherosclerosis (HCC) 07/28/2020   BPH (benign prostatic hyperplasia)    Class 2 obesity 07/28/2020   Coronary artery calcification 07/28/2020   Depression    Family history of adverse reaction to anesthesia     Hyperlipidemia 07/28/2020   Hypertension    Tobacco use 07/28/2020   Past Surgical History:  Procedure Laterality Date   CARDIOVERSION N/A 08/02/2020   Procedure: CARDIOVERSION;  Surgeon: Meriam Sprague, MD;  Location: [facility] ENDOSCOPY;  Service: Cardiovascular;  Laterality: N/A;   IR THORACENTESIS ASP PLEURAL SPACE W/IMG GUIDE  07/31/2020   TEE WITHOUT CARDIOVERSION N/A 08/02/2020   Procedure: TRANSESOPHAGEAL ECHOCARDIOGRAM (TEE);  Surgeon: Meriam Sprague, MD;  Location: [facility] ENDOSCOPY;  Service: Cardiovascular;  Laterality: N/A;   TRANSURETHRAL RESECTION OF PROSTATE     Family History  Problem Relation Age of Onset   Coronary artery disease Mother    Hypertension Mother    Hyperlipidemia Mother    Ovarian cancer Mother    Ovarian cancer Sister    Liver cancer Sister    Liver cancer Brother    Social History:   reports that he has been smoking cigarettes. He has never used smokeless tobacco. He reports current alcohol use of about 1.0 standard drink of alcohol per week. He reports that he does not use drugs.  Medications  Current Facility-Administered Medications:    acetaminophen (TYLENOL) tablet 650 mg, 650 mg, Oral, Q6H PRN **OR** acetaminophen (TYLENOL) suppository 650 mg, 650 mg, Rectal, Q6H PRN, Linna Darner, Shayan S, DO   amiodarone (PACERONE) tablet 200 mg, 200 mg, Oral, BID, Anwar, Shayan S, DO, 200 mg at 08/15/20 1054   apixaban (ELIQUIS) tablet 5 mg, 5 mg, Oral, BID, Anwar, Shayan S, DO, 5 mg at 08/15/20 1054   FLUoxetine (PROZAC) capsule 20 mg, 20 mg, Oral, QHS, Anwar, Shayan S, DO, 20 mg at 08/14/20 2245   folic acid (FOLVITE) tablet 1 mg, 1 mg, Oral, Daily, Anwar, Shayan S, DO, 1 mg at 08/15/20 1054   HYDROcodone-acetaminophen (NORCO/VICODIN) 5-325 MG per tablet 1-2 tablet, 1-2 tablet, Oral, Q4H PRN, Anwar, Shayan S, DO   ipratropium-albuterol (DUONEB) 0.5-2.5 (3) MG/3ML nebulizer solution 3 mL, 3 mL, Nebulization, Q6H PRN, Anwar, Shayan S, DO   LORazepam (ATIVAN)  injection 1 mg, 1 mg, Intravenous, Once, [place], Sarina Ill, MD   metoprolol succinate (TOPROL-XL) 24 hr tablet 100 mg, 100 mg, Oral, Daily, Anwar, Shayan S, DO, 100 mg at 08/15/20 1054   mometasone-formoterol (DULERA) 100-5 MCG/ACT inhaler 2 puff, 2 puff, Inhalation, BID, Anwar, Shayan S, DO, 2 puff at 08/15/20 0833   montelukast (SINGULAIR) tablet 10 mg, 10 mg, Oral, Daily, Anwar, Shayan S, DO, 10 mg at 08/15/20 1054   morphine 2 MG/ML injection 2 mg, 2 mg, Intravenous, Q2H PRN, Linna Darner, Shayan S, DO   multivitamin with minerals tablet 1 tablet, 1 tablet, Oral, Daily, Anwar, Shayan S, DO, 1 tablet at 08/15/20 1053   polyethylene glycol (MIRALAX / GLYCOLAX) packet 17 g, 17 g, Oral, Daily PRN, Linna Darner, Shayan S, DO   potassium chloride SA (KLOR-CON) CR tablet 20 mEq, 20 mEq, Oral, BID, Anwar, Shayan S, DO, 20 mEq at 08/15/20 1054   pravastatin (PRAVACHOL) tablet 20 mg, 20 mg, Oral, q1800, Anwar, Shayan S, DO, 20 mg at 08/14/20 1906   terazosin (HYTRIN) capsule 5 mg, 5 mg, Oral, Daily, Anwar, Shayan S, DO, 5 mg at 08/15/20 1053   thiamine  500mg  in normal saline (50ml) IVPB, 500 mg, Intravenous, Q8H, Anwar, Shayan S, DO, Last Rate: 100 mL/hr at 08/15/20 0504, 500 mg at 08/15/20 0504   traZODone (DESYREL) tablet 25 mg, 25 mg, Oral, QHS PRN, Verdia KubaAnwar, Shayan S, DO  Exam: Current vital signs: BP 110/90 (BP Location: Left Arm)   Pulse 96   Temp 98.7 F (37.1 C) (Oral)   Resp 18   Ht 5\' 8"  (1.727 m)   Wt 84.4 kg   SpO2 97%   BMI 28.29 kg/m  Vital signs in last 24 hours: Temp:  [97.9 F (36.6 C)-98.7 F (37.1 C)] 98.7 F (37.1 C) (08/09 1154) Pulse Rate:  [81-105] 96 (08/09 1154) Resp:  [17-27] 18 (08/09 1154) BP: (104-146)/(80-109) 110/90 (08/09 1154) SpO2:  [94 %-99 %] 97 % (08/09 1154) FiO2 (%):  [100 %] 100 % (08/08 1600) Weight:  [84.4 kg-85.4 kg] 84.4 kg (08/09 0447)  GENERAL: Awake, alert, in no acute distress Psych: Appears slightly anxious, patient is calm and cooperative with  examination Head: Normocephalic and atraumatic, without obvious abnormality EENT: Normal conjunctivae, dry mucous membranes, no OP obstruction LUNGS: Normal respiratory effort. Non-labored breathing on nasal cannula CV: Irregular rate and rhythm on telemetry ABDOMEN: Soft, non-tender, non-distended Extremities: warm, well perfused, without obvious deformity  NEURO:  Mental Status: Awake, alert, and oriented to name, age, and month. He is able to state that he is in the hospital but [facility]. He incorrectly states that the year is 2023. He is unable to provide a clear and coherent history of present illness. Speech/Language: speech is intact without dysarthria or aphasia.  Naming and repetition are intact.  Unsure if patient is reliably able to answer "yes/no" questions regarding his HPI as he often answers both yes and no for each question asked before settling on either yes or no. He is also unable to further elaborate on most of examiner's questions.  Poor attention is noted. He is able to state that there are 4 quarters in one dollar but is unable to compute the amount of quarters in 2 dollars. He spells WORLD correctly but is unable to provide the first letter when spelled backward. When asked the first letter of WORLD backwards after 45 seconds he states "W".. "R-O-L-D".  Memory recall is 0/3 at 3 minutes No neglect is noted Cranial Nerves:  II: PERRL 3 mm/brisk. Visual fields full.  III, IV, VI: EOMI without ptosis or nystagmus.  V: Sensation is intact to light touch and symmetrical to face.  VII: Face is symmetric resting and smiling.  VIII: Hearing is intact to voice IX, X: Palate elevation is symmetric. Phonation normal.  XI: Normal sternocleidomastoid and trapezius muscle strength XII: Tongue protrudes midline without fasciculations.   Motor: 5/5 strength is all muscle groups without vertical drift. No asymmetry noted Tone is normal. Bulk is  normal.  Sensation: Intact to light touch bilaterally in all four extremities.  Coordination: FTN intact bilaterally. HKS intact bilaterally. No pronator drift.   DTRs: 2+ and symmetric patellae and biceps Gait: Deferred  Labs I have reviewed labs in epic and the results pertinent to this consultation are: CBC    Component Value Date/Time   WBC 8.8 08/15/2020 0600   RBC 4.76 08/15/2020 0600   HGB 15.5 08/15/2020 0600   HCT 46.5 08/15/2020 0600   PLT 308 08/15/2020 0600   MCV 97.7 08/15/2020 0600   MCH 32.6 08/15/2020 0600   MCHC 33.3 08/15/2020 0600  RDW 12.8 08/15/2020 0600   LYMPHSABS 0.7 08/14/2020 1025   MONOABS 0.8 08/14/2020 1025   EOSABS 0.0 08/14/2020 1025   BASOSABS 0.0 08/14/2020 1025   CMP     Component Value Date/Time   NA 134 (L) 08/15/2020 0600   K 4.2 08/15/2020 0600   CL 99 08/15/2020 0600   CO2 29 08/15/2020 0600   GLUCOSE 106 (H) 08/15/2020 0600   BUN 34 (H) 08/15/2020 0600   CREATININE 0.97 08/15/2020 0600   CALCIUM 9.3 08/15/2020 0600   PROT 6.5 08/15/2020 0600   ALBUMIN 3.3 (L) 08/15/2020 0600   AST 38 08/15/2020 0600   ALT 76 (H) 08/15/2020 0600   ALKPHOS 125 08/15/2020 0600   BILITOT 1.2 08/15/2020 0600   GFRNONAA >60 08/15/2020 0600   Lipid Panel     Component Value Date/Time   CHOL 73 07/30/2020 0555   TRIG 29 07/30/2020 0555   HDL 34 (L) 07/30/2020 0555   CHOLHDL 2.1 07/30/2020 0555   VLDL 6 07/30/2020 0555   LDLCALC 33 07/30/2020 0555   Lab Results  Component Value Date   HGBA1C 5.7 (H) 07/30/2020   Lab Results  Component Value Date   TSH 1.920 07/28/2020   Lab Results  Component Value Date   VITAMINB12 317 08/15/2020    Ammonia 08/14/2020: 28  Urinalysis    Component Value Date/Time   COLORURINE YELLOW 08/14/2020 1440   APPEARANCEUR HAZY (A) 08/14/2020 1440   LABSPEC 1.036 (H) 08/14/2020 1440   PHURINE 5.0 08/14/2020 1440   GLUCOSEU NEGATIVE 08/14/2020 1440   HGBUR NEGATIVE 08/14/2020 1440   BILIRUBINUR NEGATIVE  08/14/2020 1440   KETONESUR NEGATIVE 08/14/2020 1440   PROTEINUR NEGATIVE 08/14/2020 1440   NITRITE NEGATIVE 08/14/2020 1440   LEUKOCYTESUR NEGATIVE 08/14/2020 1440    Alcohol Level    Component Value Date/Time   ETH <10 08/14/2020 1025   Imaging I have reviewed the images obtained:  CT-scan of the brain 08/14/2020: No acute finding. No change since the previous study. Atrophy with some frontal predominance.  MRI examination of the brain pending EEG pending  Assessment: 70 y.o. male with recent hospitalization and discharge who presents for evaluation of 4-5 days of progressive acute encephalopathy and a witnessed 30 minute episode of syncope and unresponsiveness at home.  - Examination reveals patient with poor attention who is disoriented to time and situation, poor short term memory but without focal neurological deficits. - Patient with history of heavy alcohol use, patient reports 1/5th of liquor daily (but is unreliable in his reporting), per patient's Barnes, she estimates that he drinks at least 5-6 whiskey drinks daily with last use over 2.5 weeks ago prior to his recent admission in July. He also endorses daily marijuana use.  - [facility] 08/14/2020 is without acute finding. Further imaging pending: MRI brain without contrast, EEG.  - Differential diagnosis list for patient's acute encephalopathy is broad including delirium, seizures, stroke, Wernicke's encephalopathy from prolonged alcohol use, alcohol withdrawal delirium, toxic-metabolic, or infectious etiologies of encephalopathy or multifactorial. His presentation may also be compounded by hypoxia and hypercarbia due to patient frequently taking his oxygen off at home. There is a low suspicion for meningitis/encephalitis without leukocytosis, nuchal rigidity, afebrile state, and without complaints of headache or other associated symptoms. Further encephalopathy work up pending.   Impression: Acute encephalopathy- confusion and  disorientation; etiology unclear  Recommendations: - MRI brain without contrast - Routine EEG - Vitamin B1 level - High dose thiamine replacement; 500 mg TID x 3  days followed by 250 mg daily for 5 days then 100 mg daily - Monitor for infectious causes- UA, CXR, follow fever curve - MRI brain without contrast - Inpatient seizure precautions - Try to minimize deliriogenic medications as much as possible (J Am Geriatr Soc. 2012 Apr;60(4):616-31): benzodiazepines, anticholinergics, diphenhydramine, antihistamines, narcotics, Ambien/Lunesta/Sonata etc. - Consider low dose seroquel (12.5- 25 mg nightly PRN) while monitoring QTc to reduce aggressive behavior if needed  Pt seen by NP/Neuro and later by MD. Note/plan to be edited by MD as needed.  Lanae Boast, AGAC-NP [facility] Pager: [phone]  Attending Neurohospitalist Addendum Patient seen and examined with APP/Resident. Agree with the history and physical as documented above. Agree with the plan as documented, which I helped formulate.  In addition, my updated recommendations are below  When I went to see the patient, he is unable to provide any history.  He is sitting in his bed clenching the side rail of the bed with his left hand and repeatedly chanting: Yes yes yes yes. Did not follow any commands for me.  No focal weakness noted. I will hook him up for continuous EEG-routine EEG was completed-read pending. Rest of the recommendations as above including obtaining MRI of the brain without contrast when able to-can wait till tomorrow. Wernicke's encephalopathy progressing to Korsakoff psychosis remains top of the differential given strong alcohol history and because of the recent discontinuation, withdrawal seizures also remain within the realm of possibility although he is far out from the withdrawal symptoms.  Other possibilities include toxic metabolic encephalopathy due to marijuana use  I have independently  reviewed the chart, obtained history, review of systems and examined the patient.I have personally reviewed pertinent head/neck/spine imaging (CT/MRI). Please feel free to call with any questions.  -- Milon Dikes, MD Neurologist [facility] Pager: [phone]   Addendum Routine EEG unremarkable Will follow -- Milon Dikes, MD Neurologist [facility] Pager: [phone]

## 2020-08-15 NOTE — Progress Notes (Signed)
LTM EEG hooked up and running - no initial skin breakdown - push button tested - neuro notified. [facility] monitoring.  

## 2020-08-15 NOTE — Progress Notes (Addendum)
Registered nurse called to the patient's room by daughter, patient is currently saying incoherent words "Yes yes yes". Mentation cannot be ascertained at this time. MD notified, VSS. Will continue to monitor.  16:48 pm Per neurology, "give ativan at this time as patient is stiff and might be seizing": Ativan administered.

## 2020-08-15 NOTE — Progress Notes (Signed)
EEG complete - results pending 

## 2020-08-16 ENCOUNTER — Observation Stay (HOSPITAL_COMMUNITY): Payer: Medicare Other

## 2020-08-16 DIAGNOSIS — I110 Hypertensive heart disease with heart failure: Secondary | ICD-10-CM | POA: Diagnosis present

## 2020-08-16 DIAGNOSIS — R410 Disorientation, unspecified: Secondary | ICD-10-CM | POA: Diagnosis present

## 2020-08-16 DIAGNOSIS — G9341 Metabolic encephalopathy: Secondary | ICD-10-CM | POA: Diagnosis present

## 2020-08-16 DIAGNOSIS — J9621 Acute and chronic respiratory failure with hypoxia: Secondary | ICD-10-CM | POA: Diagnosis present

## 2020-08-16 DIAGNOSIS — J9601 Acute respiratory failure with hypoxia: Secondary | ICD-10-CM | POA: Diagnosis not present

## 2020-08-16 DIAGNOSIS — F1026 Alcohol dependence with alcohol-induced persisting amnestic disorder: Secondary | ICD-10-CM | POA: Diagnosis present

## 2020-08-16 DIAGNOSIS — Z20822 Contact with and (suspected) exposure to covid-19: Secondary | ICD-10-CM | POA: Diagnosis present

## 2020-08-16 DIAGNOSIS — I4819 Other persistent atrial fibrillation: Secondary | ICD-10-CM | POA: Diagnosis present

## 2020-08-16 DIAGNOSIS — Z888 Allergy status to other drugs, medicaments and biological substances status: Secondary | ICD-10-CM | POA: Diagnosis not present

## 2020-08-16 DIAGNOSIS — E512 Wernicke's encephalopathy: Secondary | ICD-10-CM | POA: Diagnosis present

## 2020-08-16 DIAGNOSIS — I2510 Atherosclerotic heart disease of native coronary artery without angina pectoris: Secondary | ICD-10-CM | POA: Diagnosis present

## 2020-08-16 DIAGNOSIS — I700 Atherosclerosis of aorta: Secondary | ICD-10-CM | POA: Diagnosis present

## 2020-08-16 DIAGNOSIS — N179 Acute kidney failure, unspecified: Secondary | ICD-10-CM | POA: Diagnosis present

## 2020-08-16 DIAGNOSIS — F05 Delirium due to known physiological condition: Secondary | ICD-10-CM | POA: Diagnosis present

## 2020-08-16 DIAGNOSIS — Z881 Allergy status to other antibiotic agents status: Secondary | ICD-10-CM | POA: Diagnosis not present

## 2020-08-16 DIAGNOSIS — R55 Syncope and collapse: Secondary | ICD-10-CM | POA: Diagnosis present

## 2020-08-16 DIAGNOSIS — N400 Enlarged prostate without lower urinary tract symptoms: Secondary | ICD-10-CM | POA: Diagnosis present

## 2020-08-16 DIAGNOSIS — I5032 Chronic diastolic (congestive) heart failure: Secondary | ICD-10-CM | POA: Diagnosis present

## 2020-08-16 DIAGNOSIS — F32A Depression, unspecified: Secondary | ICD-10-CM | POA: Diagnosis present

## 2020-08-16 DIAGNOSIS — Z8249 Family history of ischemic heart disease and other diseases of the circulatory system: Secondary | ICD-10-CM | POA: Diagnosis not present

## 2020-08-16 DIAGNOSIS — F17210 Nicotine dependence, cigarettes, uncomplicated: Secondary | ICD-10-CM | POA: Diagnosis present

## 2020-08-16 DIAGNOSIS — Z7901 Long term (current) use of anticoagulants: Secondary | ICD-10-CM | POA: Diagnosis not present

## 2020-08-16 DIAGNOSIS — E785 Hyperlipidemia, unspecified: Secondary | ICD-10-CM | POA: Diagnosis present

## 2020-08-16 DIAGNOSIS — Z9981 Dependence on supplemental oxygen: Secondary | ICD-10-CM | POA: Diagnosis not present

## 2020-08-16 DIAGNOSIS — Z79899 Other long term (current) drug therapy: Secondary | ICD-10-CM | POA: Diagnosis not present

## 2020-08-16 MED ORDER — CYANOCOBALAMIN 1000 MCG/ML IJ SOLN
1000.0000 ug | Freq: Every day | INTRAMUSCULAR | Status: AC
  Administered 2020-08-16 – 2020-08-18 (×3): 1000 ug via SUBCUTANEOUS
  Filled 2020-08-16 (×3): qty 1

## 2020-08-16 MED ORDER — LORAZEPAM 2 MG/ML IJ SOLN
0.5000 mg | INTRAMUSCULAR | Status: DC | PRN
  Administered 2020-08-16: 0.5 mg via INTRAVENOUS
  Filled 2020-08-16: qty 1

## 2020-08-16 MED ORDER — LORAZEPAM 2 MG/ML IJ SOLN
0.2500 mg | Freq: Once | INTRAMUSCULAR | Status: AC
  Administered 2020-08-16: 0.25 mg via INTRAVENOUS
  Filled 2020-08-16: qty 1

## 2020-08-16 NOTE — Progress Notes (Signed)
[facility] Progress Note  Patient: Samuel Barnes    ZOX:[id number]  DOA: 08/14/2020     Date of Service: the patient was seen and examined on 08/16/2020  Brief hospital course: 70 year old male with medical history of CHF with chronic hypoxemic respiratory failure on 3 L/min oxygen at baseline, atrial fibrillation on Eliquis, BPH, CAD, hyperlipidemia, tobacco use, obesity, BPH, depression presented to the ED after he had syncopal episode at home.   Daughter states that he requires a lot of prompting to do tasks now.  Difficulty using a fork at home to eat himself.  He has been also more forgetful lately.  Does recognize family.  He has been dropping things more often.  He did have a history of alcohol abuse but has not been drinking for the past few weeks per daughter.  Approximately 3 weeks she states. She states that he has unsteady gait as well.  Currently plan is treat for Wernicke's with IV thiamine.  Subjective: No nausea no vomiting.  No fever no chills.  Remains confused.  Appears to be confabulating.  Assessment and Plan: Syncope -Patient had episode of unresponsiveness -Continue monitoring on telemetry -Patient had TEE on 08/02/2020, which showed EF of 55 to 60%, no left atrial appendage thrombus detected.  Patient had DCCV performed after TEE -MRI brain negative.  EEG negative.  No focal deficit seen and examined as well.  No further work-up.   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  We will supplement. Outpatient neurology consultation. MRI brain negative for any acute abnormality. No further work-up.  Paroxysmal atrial fibrillation -EKG from 08/14/2020 showed atrial fibrillation -Heart rate controlled with metoprolol -Continue amiodarone -Continue anticoagulation with apixaban    Hypertension -Losartan is on hold, BP is soft -Continue metoprolol as above   Chronic diastolic CHF Lasix currently on hold  Scheduled Meds:  amiodarone  200 mg Oral BID   apixaban  5 mg Oral BID   cyanocobalamin  1,000 mcg Subcutaneous Q1200   FLUoxetine  20 mg Oral QHS   folic acid  1 mg Oral Daily   metoprolol succinate  100 mg Oral Daily   mometasone-formoterol  2 puff Inhalation BID   montelukast  10 mg Oral Daily   multivitamin with minerals  1 tablet Oral Daily   potassium chloride SA  20 mEq Oral BID   pravastatin  20 mg Oral q1800   terazosin  5 mg Oral Daily   Continuous Infusions: PRN Meds: acetaminophen **OR** acetaminophen, HYDROcodone-acetaminophen, ipratropium-albuterol, LORazepam, morphine injection, polyethylene glycol, traZODone  Body mass index is 28.29 kg/m.        DVT Prophylaxis:    apixaban (ELIQUIS) tablet 5 mg    Advance goals of care discussion: Pt is Full code.  Family Communication: no family was present at bedside, at the time of interview.   Data Reviewed: I have personally reviewed and interpreted daily labs, tele strips, imaging. MRI brain negative for any acute intracranial pathology.  Physical Exam:  General: Appear in mild distress, no Rash; Oral Mucosa Clear, moist. no Abnormal Neck Mass Or lumps, Conjunctiva normal  Cardiovascular: S1 and S2 Present, no Murmur, Respiratory: good respiratory effort, Bilateral Air entry present and CTA, no Crackles, no wheezes Abdomen: Bowel Sound present, Soft and no tenderness Extremities: no Pedal edema Neurology: alert and oriented to person affect appropriate. no new focal  deficit Gait not checked due to patient safety concerns   Vitals:   08/15/20 2359 08/16/20 0826 08/16/20 1208 08/16/20 1617  BP: 105/71 (!) 144/80 131/83 (!) 123/91  Pulse: 69 65 76 76  Resp: 20 18 18 20   Temp: 98 F (36.7 C) 97.8 F (36.6 C) 97.8 F (36.6 C) 97.8 F (36.6 C)  TempSrc:  Oral Oral Oral  SpO2:  98% 95% 97% 97%  Weight:      Height:        Disposition:  Status is: Inpatient  Remains inpatient appropriate because:Altered mental status  Dispo: The patient is from: Home              Anticipated d/c is to: SNF              Patient currently is not medically stable to d/c.   Difficult to place patient No   Time spent: 35 minutes. I reviewed all nursing notes, pharmacy notes, vitals, pertinent old records. I have discussed plan of care as described above with RN.  Author: , MD [facility] 08/16/2020 6:49 PM  To reach On-call, see care teams to locate the attending and reach out via [email or web address]. Between 7PM-7AM, please contact night-coverage If you still have difficulty reaching the attending provider, please page the [facility] (Director on Call) for [facility] on amion for assistance.

## 2020-08-16 NOTE — Progress Notes (Signed)
LTM EEG discontinued - small cut at f7 during unhook.

## 2020-08-16 NOTE — Progress Notes (Addendum)
Neurology Progress Note  S: Patient states he is feeling a lot better. Speech has improved. No pain, HA, numbness, tingling or weakness of an extremity. Says he can not believe his state of health when he came into hospital.   O: Current vital signs: BP 131/83 (BP Location: Left Arm)   Pulse 76   Temp 97.8 F (36.6 C) (Oral)   Resp 18   Ht 5\' 8"  (1.727 m)   Wt 84.4 kg   SpO2 97%   BMI 28.29 kg/m  Vital signs in last 24 hours: Temp:  [97.5 F (36.4 C)-98.3 F (36.8 C)] 97.8 F (36.6 C) (08/10 1208) Pulse Rate:  [65-83] 76 (08/10 1208) Resp:  [18-20] 18 (08/10 1208) BP: (98-144)/(70-83) 131/83 (08/10 1208) SpO2:  [95 %-98 %] 97 % (08/10 1208)  GENERAL:  Well appearing male. Awake, alert in NAD. HEENT: Normocephalic and atraumatic. LUNGS: Normal respiratory effort.  CV: RRR on tele.  Ext: warm.  NEURO:  Mental Status: Alert. Oriented to name, month, year, place, city and state. Not oriented to date. Follows commands. When spelling WORLD backwards, he says D, W, O. States there are 7 quarters in $2.75.  Speech/Language: speech is without aphasia or dysarthria. Speech is fluent.  Naming, repetition, fluency, and comprehension intact.  Cranial Nerves:  II: PERRL. Visual fields full.  III, IV, VI: EOMI. Eyelids elevate symmetrically.  V: Sensation is intact to light touch and symmetrical to face.  VII: Smile is symmetrical. Able to puff cheeks and raise eyebrows.  VIII: hearing intact to voice. IX, X: Palate elevates symmetrically. Phonation is normal.  1209 shrug 5/5. XII: tongue is midline without fasciculations. Motor:  5/5 to all muscle groups tested.  Tone: is normal and bulk is normal. Sensation- Intact to light touch bilaterally. Extinction absent to DSS.    Coordination: FTN intact bilaterally, HKS: no ataxia in BLE. No drift.  DTRs:  2+ to biceps and patellae.   Medications  Current Facility-Administered Medications:    acetaminophen (TYLENOL) tablet  650 mg, 650 mg, Oral, Q6H PRN **OR** acetaminophen (TYLENOL) suppository 650 mg, 650 mg, Rectal, Q6H PRN, [id number], Shayan S, DO   amiodarone (PACERONE) tablet 200 mg, 200 mg, Oral, BID, Anwar, Shayan S, DO, 200 mg at 08/16/20 1021   apixaban (ELIQUIS) tablet 5 mg, 5 mg, Oral, BID, Anwar, Shayan S, DO, 5 mg at 08/16/20 1021   cyanocobalamin ((VITAMIN B-12)) injection 1,000 mcg, 1,000 mcg, Subcutaneous, Q1200, 10/16/20, MD   FLUoxetine (PROZAC) capsule 20 mg, 20 mg, Oral, QHS, Anwar, Shayan S, DO, 20 mg at 08/15/20 2122   folic acid (FOLVITE) tablet 1 mg, 1 mg, Oral, Daily, Anwar, Shayan S, DO, 1 mg at 08/16/20 1022   HYDROcodone-acetaminophen (NORCO/VICODIN) 5-325 MG per tablet 1-2 tablet, 1-2 tablet, Oral, Q4H PRN, Anwar, Shayan S, DO   ipratropium-albuterol (DUONEB) 0.5-2.5 (3) MG/3ML nebulizer solution 3 mL, 3 mL, Nebulization, Q6H PRN, Anwar, Shayan S, DO   LORazepam (ATIVAN) injection 0.5 mg, 0.5 mg, Intravenous, Q15 min PRN, 10/16/20, MD   metoprolol succinate (TOPROL-XL) 24 hr tablet 100 mg, 100 mg, Oral, Daily, Anwar, Shayan S, DO, 100 mg at 08/16/20 1022   mometasone-formoterol (DULERA) 100-5 MCG/ACT inhaler 2 puff, 2 puff, Inhalation, BID, 10/16/20, Sharl Ma, MD, 2 puff at 08/16/20 0757   montelukast (SINGULAIR) tablet 10 mg, 10 mg, Oral, Daily, Anwar, Shayan S, DO, 10 mg at 08/16/20 1021   morphine 2 MG/ML injection 2 mg, 2 mg, Intravenous, Q2H PRN, 10/16/20,  Shayan S, DO   multivitamin with minerals tablet 1 tablet, 1 tablet, Oral, Daily, Lynwood Dawley S, DO, 1 tablet at 08/16/20 1022   polyethylene glycol (MIRALAX / GLYCOLAX) packet 17 g, 17 g, Oral, Daily PRN, Linna Darner, Shayan S, DO   potassium chloride SA (KLOR-CON) CR tablet 20 mEq, 20 mEq, Oral, BID, Anwar, Shayan S, DO, 20 mEq at 08/16/20 1022   pravastatin (PRAVACHOL) tablet 20 mg, 20 mg, Oral, q1800, Anwar, Shayan S, DO, 20 mg at 08/15/20 1845   terazosin (HYTRIN) capsule 5 mg, 5 mg, Oral, Daily, Anwar, Shayan S, DO, 5 mg at  08/16/20 1022   thiamine 500mg  in normal saline (62ml) IVPB, 500 mg, Intravenous, Q8H, Anwar, Shayan S, DO, Last Rate: 100 mL/hr at 08/16/20 0452, 500 mg at 08/16/20 0452   traZODone (DESYREL) tablet 25 mg, 25 mg, Oral, QHS PRN, 10/16/20 S, DO  Pertinent Labs B12 317. UA negative. ETOH < 10.   No new Imaging  EEG 08/16/20:  This study is within normal limits. No seizures or epileptiform discharges were seen throughout the recording.   Assessment: 70 yo male who presented to ED after syncopal episode at home and  4 days of progressive acute encephalopathy. Due to language abnormality and ETOH use history, patient was placed on high dose Thiamine. His speech has improved and he is more oriented than prior exam. EEG was performed due to unresponsive spell and possible ETOH withdrawal as patient had not had ETOH for 2 weeks. However, cEEG was negative for seizure activity. The Thiamine high dose IV may or may not have been due to Wernicke's, but has helped none the less. His MRI brain is still pending.    Impression: -Acute progressive encephalopathy, etiology unclear.  -? Wernicke's-improved with Thiamine.  -low B12 level per neurology standards, being repleted.   Recommendations/Plan:  -Delirium precautions.  -Avoid medicines for the elderly as on the Beer's list.  -EEG d/c'd.  -Continue Thiamine and continue on discharge at 100 mg po qd.   Pt seen by 61, MSN, APN-BC/Nurse Practitioner/Neuro and later by MD. Note and plan to be edited as needed by MD.  Pager: Jimmye Norman   Attending addendum Exam much improved for me today-no more repetitive chanting. Was able to tell me where he is, did have poor attention concentration and registration 3/3 with repetition 1/3 even with prompting. I suspect a component of underlying cognitive deficit and alcohol-related memory issues. I would recommend keeping B12 of 312. I would also recommend thiamine-500 mg 3 times daily for 3  days followed by 250 mg daily for 5 days followed by 100 mg p.o. daily outpatient. Delirium precautions I would still get an MRI to make sure an acute process is not being missed as well as to get supporting information for Wernicke's if there is any involvement of mamillary bodies.  D/With Dr. [phone].   -- Allena Katz, MD Neurologist [facility] Pager: [phone]   Addendum MRI brain unremarkable Recommendations as above Please call with questions as needed  -- [phone], MD Neurologist [facility] Pager: [phone]

## 2020-08-16 NOTE — Procedures (Addendum)
Patient Name: Samuel Barnes  MRN: [id number]  Epilepsy Attending: Charlsie Quest  Referring Physician/Provider: Dr Milon Dikes Duration: 08/15/2020 1745 to 08/16/2020 1006   Patient history: 70yo M with  4-5 days of progressive acute encephalopathy and a witnessed 30 minute episode of syncope and unresponsiveness at home. EEG to evaluate for seizure   Level of alertness: Awake, asleep   AEDs during EEG study: LEV   Technical aspects: This EEG study was done with scalp electrodes positioned according to the 10-20 International system of electrode placement. Electrical activity was acquired at a sampling rate of 500Hz  and reviewed with a high frequency filter of 70Hz  and a low frequency filter of 1Hz . EEG data were recorded continuously and digitally stored.   Description: The posterior dominant rhythm consists of 9-10 Hz activity of moderate voltage (25-35 uV) seen predominantly in posterior head regions, symmetric and reactive to eye opening and eye closing.  Sleep was characterized by vertex waves, sleep spindles (12 to 14 Hz), maximal frontocentral region. Hyperventilation and photic stimulation were not performed.      IMPRESSION: This study is within normal limits. No seizures or epileptiform discharges were seen throughout the recording.   [name] 

## 2020-08-17 LAB — VITAMIN B1: Vitamin B1 (Thiamine): 336.7 nmol/L — ABNORMAL HIGH (ref 66.5–200.0)

## 2020-08-17 MED ORDER — THIAMINE HCL 100 MG/ML IJ SOLN
250.0000 mg | Freq: Every day | INTRAVENOUS | Status: AC
  Administered 2020-08-17 – 2020-08-21 (×5): 250 mg via INTRAVENOUS
  Filled 2020-08-17 (×5): qty 2.5

## 2020-08-17 NOTE — Plan of Care (Signed)

## 2020-08-17 NOTE — Evaluation (Signed)
Physical Therapy Evaluation Patient Details Name: Samuel Barnes MRN: [id number] DOB: [birth year] Today's Date: 08/17/2020   History of Present Illness  70yo male admitted 08/14/20 after being found unresponsive at home by family. Note recent hospitalization secondary to CHF and Afib. Admitted for w/u of syncope and acute metabolic encephalopathy. PMH obesity, depression, HLD, HTN, hx cardioversion, CHF, tobacco abuse  Clinical Impression   Patient received in bed, very pleasant and cooperative but a bit confused still. Able to mobilize on a supervision to min guard basis with RW, and definitely unsteady dynamically without BUE support. VSS on RA with SpO2 no lower than 95%. Left up in recliner with all needs met, chair alarm active, and RN aware of patient status. Will continue to follow.     Follow Up Recommendations Home health PT;Supervision/Assistance - 24 hour    Equipment Recommendations  Rolling walker with 5" wheels    Recommendations for Other Services       Precautions / Restrictions Precautions Precautions: Fall Restrictions Weight Bearing Restrictions: No      Mobility  Bed Mobility Overal bed mobility: Needs Assistance Bed Mobility: Supine to Sit     Supine to sit: Supervision     General bed mobility comments: no physical assist given    Transfers Overall transfer level: Needs assistance Equipment used: Rolling walker (2 wheeled) Transfers: Sit to/from Stand Sit to Stand: Supervision         General transfer comment: S for safety, cues for hand placement but no physical assist given  Ambulation/Gait Ambulation/Gait assistance: Min guard Gait Distance (Feet): 100 Feet Assistive device: Rolling walker (2 wheeled) Gait Pattern/deviations: Step-through pattern;Decreased step length - right;Decreased step length - left;Trunk flexed Gait velocity: decreased   General Gait Details: slow and steady with RW, seems to have reduced proprioception in  BLEs but VSS on RA- SpO2 no lower than 95% on RA  Stairs            Wheelchair Mobility    Modified Rankin (Stroke Patients Only)       Balance Overall balance assessment: Needs assistance Sitting-balance support: Bilateral upper extremity supported;No upper extremity supported Sitting balance-Leahy Scale: Good     Standing balance support: Bilateral upper extremity supported;During functional activity Standing balance-Leahy Scale: Fair Standing balance comment: benefits from BUE support dynamically                             Pertinent Vitals/Pain Pain Assessment: No/denies pain    Home Living Family/patient expects to be discharged to:: Private residence Living Arrangements: Alone Available Help at Discharge: Family;Available PRN/intermittently Type of Home: House Home Access: Stairs to enter Entrance Stairs-Rails: None Entrance Stairs-Number of Steps: 1 step in the back; 6-7 in front but doesn't enter through front Home Layout: One level Home Equipment: Walker - 2 wheels;Grab bars - tub/shower (lift chair)      Prior Function Level of Independence: Independent         Comments: Has RW but did not use it     Hand Dominance   Dominant Hand: Right    Extremity/Trunk Assessment   Upper Extremity Assessment Upper Extremity Assessment: Defer to OT evaluation    Lower Extremity Assessment Lower Extremity Assessment: Generalized weakness    Cervical / Trunk Assessment Cervical / Trunk Assessment: Normal  Communication   Communication: No difficulties  Cognition Arousal/Alertness: Awake/alert Behavior During Therapy: WFL for tasks assessed/performed Overall Cognitive Status: Impaired/Different from baseline Area  of Impairment: Orientation;Attention;Memory;Following commands;Safety/judgement;Awareness;Problem solving                 Orientation Level: Disoriented to;Situation Current Attention Level: Selective Memory: Decreased  short-term memory Following Commands: Follows one step commands consistently;Follows multi-step commands consistently;Follows multi-step commands with increased time Safety/Judgement: Decreased awareness of safety;Decreased awareness of deficits Awareness: Intellectual Problem Solving: Slow processing;Decreased initiation;Difficulty sequencing;Requires verbal cues General Comments: very pleasant and cooperative but perseverating on "My doctors said I need O2 24/7" and able to tell me about prior hospitalization but not why he is here now      General Comments General comments (skin integrity, edema, etc.): VSS on RA    Exercises     Assessment/Plan    PT Assessment Patient needs continued PT services  PT Problem List Decreased strength;Decreased cognition;Decreased knowledge of use of DME;Decreased activity tolerance;Decreased safety awareness;Decreased balance;Decreased mobility;Decreased coordination       PT Treatment Interventions Gait training;Therapeutic exercise;Therapeutic activities;Balance training;Stair training;DME instruction;Cognitive remediation;Functional mobility training;Patient/family education    PT Goals (Current goals can be found in the Care Plan section)  Acute Rehab PT Goals Patient Stated Goal: Return home and get stronger PT Goal Formulation: With patient Time For Goal Achievement: 08/31/20 Potential to Achieve Goals: Good    Frequency Min 3X/week   Barriers to discharge        Co-evaluation               AM-PAC PT "6 Clicks" Mobility  Outcome Measure Help needed turning from your back to your side while in a flat bed without using bedrails?: None Help needed moving from lying on your back to sitting on the side of a flat bed without using bedrails?: None Help needed moving to and from a bed to a chair (including a wheelchair)?: A Little Help needed standing up from a chair using your arms (e.g., wheelchair or bedside chair)?: None Help  needed to walk in hospital room?: A Little Help needed climbing 3-5 steps with a railing? : A Little 6 Click Score: 21    End of Session Equipment Utilized During Treatment: Gait belt Activity Tolerance: Patient tolerated treatment well Patient left: in chair;with call bell/phone within reach;with chair alarm set Nurse Communication: Mobility status PT Visit Diagnosis: Unsteadiness on feet (R26.81);Muscle weakness (generalized) (M62.81)    Time: 0347-4259 PT Time Calculation (min) (ACUTE ONLY): 25 min   Charges:   PT Evaluation $PT Eval Moderate Complexity: 1 Mod PT Treatments $Gait Training: 8-22 mins       Windell Norfolk, DPT, PN2   Supplemental Physical Therapist [place]    Pager [phone] Acute Rehab Office [phone]

## 2020-08-17 NOTE — Discharge Instructions (Signed)

## 2020-08-17 NOTE — Progress Notes (Signed)
[facility] Progress Note  Patient: Samuel Barnes    XLK:[id number]  DOA: 08/14/2020     Date of Service: the patient was seen and examined on 08/17/2020  Brief hospital course: 70 year old male with medical history of CHF with chronic hypoxemic respiratory failure on 3 L/min oxygen at baseline, atrial fibrillation on Eliquis, BPH, CAD, hyperlipidemia, tobacco use, obesity, BPH, depression presented to the ED after he had syncopal episode at home.   Daughter states that he requires a lot of prompting to do tasks now.  Difficulty using a fork at home to eat himself.  He has been also more forgetful lately.  Does recognize family.  He has been dropping things more often.  He did have a history of alcohol abuse but has not been drinking for the past few weeks per daughter.  Approximately 3 weeks she states. She states that he has unsteady gait as well.  Currently plan is treat for Wernicke's with IV thiamine.  Subjective: Still confused.  No acute complaint.  No nausea no vomiting but no fever no chills.  Assessment and Plan: Syncope -Patient had episode of unresponsiveness -Continue monitoring on telemetry -Patient had TEE on 08/02/2020, which showed EF of 55 to 60%, no left atrial appendage thrombus detected.  Patient had DCCV performed after TEE -MRI brain negative.  EEG negative.  No focal deficit seen and examined as well.  No further work-up.   Suspected Wernicke's encephalopathy -Patient has been having cognitive decline for past few weeks -CT head is unremarkable -MRI brain ordered as above -Started on IV thiamine.  Completed 500 mg 3 times daily.  Now on to 250 mg daily B12 is relatively low.  We will supplement. Appreciate neurology consultation. MRI brain negative for any acute abnormality. No further work-up.  Paroxysmal atrial fibrillation -EKG from 08/14/2020 showed atrial fibrillation -Heart rate controlled with metoprolol -Continue amiodarone -Continue  anticoagulation with apixaban   Hypertension -Losartan is on hold, BP is soft -Continue metoprolol as above   Chronic diastolic CHF Lasix currently on hold  Scheduled Meds:  amiodarone  200 mg Oral BID   apixaban  5 mg Oral BID   cyanocobalamin  1,000 mcg Subcutaneous Q1200   FLUoxetine  20 mg Oral QHS   folic acid  1 mg Oral Daily   metoprolol succinate  100 mg Oral Daily   mometasone-formoterol  2 puff Inhalation BID   montelukast  10 mg Oral Daily   multivitamin with minerals  1 tablet Oral Daily   potassium chloride SA  20 mEq Oral BID   pravastatin  20 mg Oral q1800   terazosin  5 mg Oral Daily   Continuous Infusions:  thiamine injection 250 mg (08/17/20 0955)   PRN Meds: acetaminophen **OR** acetaminophen, HYDROcodone-acetaminophen, ipratropium-albuterol, morphine injection, polyethylene glycol, traZODone  Body mass index is 28.39 kg/m.        DVT Prophylaxis:    apixaban (ELIQUIS) tablet 5 mg    Advance goals of care discussion: Pt is Full code.  Family Communication: no family was present at bedside, at the time of interview.   Data Reviewed: I have personally reviewed and interpreted daily labs, tele strips, imaging.  Physical Exam:  General: Appear in mild distress, no Rash; Oral Mucosa Clear, moist. no Abnormal Neck Mass Or lumps, Conjunctiva normal  Cardiovascular: S1 and S2 Present, no Murmur, Respiratory: good respiratory effort, Bilateral Air entry present and CTA, no Crackles, no wheezes Abdomen: Bowel Sound present, Soft and no tenderness Extremities: no  Pedal edema Neurology: alert and oriented to place, and person affect appropriate. no new focal deficit, no asterixis of the Gait not checked due to patient safety concerns   Vitals:   08/17/20 0459 08/17/20 0507 08/17/20 0946 08/17/20 1642  BP:  (!) 140/92 128/86 123/88  Pulse:  69 85 65  Resp:  17 16 20   Temp:  97.6 F (36.4 C) 97.7 F (36.5 C) 97.7 F (36.5 C)  TempSrc:  Oral Oral  Oral  SpO2:  94% 93% 95%  Weight: 84.7 kg     Height:        Disposition:  Status is: Inpatient  Remains inpatient appropriate because:Altered mental status  Dispo: The patient is from: Home              Anticipated d/c is to: SNF              Patient currently is not medically stable to d/c.   Difficult to place patient No   Time spent: 35 minutes. I reviewed all nursing notes, pharmacy notes, vitals, pertinent old records. I have discussed plan of care as described above with RN.  Author: , MD [facility] 08/17/2020 5:35 PM  To reach On-call, see care teams to locate the attending and reach out via [email or web address]. Between 7PM-7AM, please contact night-coverage If you still have difficulty reaching the attending provider, please page the [facility] (Director on Call) for [facility] on amion for assistance.

## 2020-08-18 LAB — BASIC METABOLIC PANEL
Anion gap: 7 (ref 5–15)
BUN: 18 mg/dL (ref 8–23)
CO2: 24 mmol/L (ref 22–32)
Calcium: 8.8 mg/dL — ABNORMAL LOW (ref 8.9–10.3)
Chloride: 101 mmol/L (ref 98–111)
Creatinine, Ser: 0.83 mg/dL (ref 0.61–1.24)
GFR, Estimated: >60 mL/min (ref >60)
Glucose, Bld: 87 mg/dL (ref 70–99)
Potassium: 4.2 mmol/L (ref 3.5–5.1)
Sodium: 132 mmol/L — ABNORMAL LOW (ref 135–145)

## 2020-08-18 NOTE — Progress Notes (Signed)
RT NOTES: Pt on room air, sats 93%. Patient on oxygen at home at 2lpm per history and patient. Placed placed on 2lpm nasal cannula. Notified RN.

## 2020-08-18 NOTE — Progress Notes (Signed)
[facility] Progress Note  Patient: Samuel Barnes    WJX:[id number]  DOA: 08/14/2020     Date of Service: the patient was seen and examined on 08/18/2020  Brief hospital course: 70 year old male with medical history of CHF with chronic hypoxemic respiratory failure on 3 L/min oxygen at baseline, atrial fibrillation on Eliquis, BPH, CAD, hyperlipidemia, tobacco use, obesity, BPH, depression presented to the ED after he had syncopal episode at home.   Daughter states that he requires a lot of prompting to do tasks now.  Difficulty using a fork at home to eat himself.  He has been also more forgetful lately.  Does recognize family.  He has been dropping things more often.  He did have a history of alcohol abuse but has not been drinking for the past few weeks per daughter.  Approximately 3 weeks she states. She states that he has unsteady gait as well.  Currently plan is treat for Wernicke's with IV thiamine.  Subjective: Daughter at bedside tells me that the patient is showing improvement in mentation but not still back to baseline.  Able to carry out conversation.  No nausea no vomiting.  Patient denies any acute complaint.  Assessment and Plan: Syncope -Patient had episode of unresponsiveness -Continue monitoring on telemetry -Patient had TEE on 08/02/2020, which showed EF of 55 to 60%, no left atrial appendage thrombus detected.  Patient had DCCV performed after TEE -MRI brain negative.  EEG negative.  No focal deficit seen and examined as well.  No further work-up.   Suspected Wernicke's encephalopathy -Patient has been having cognitive decline for past few weeks -CT head is unremarkable -MRI brain ordered as above -Started on IV thiamine.  Completed 500 mg 3 times daily.  Now on to 250 mg daily.  B1 level performed at the time of admission was done after patient receiving IV thiamine 500 mg thus does not hold any significant value clinically. B12 is relatively low.  We will  supplement. Appreciate neurology consultation. MRI brain negative for any acute abnormality. No further work-up.  Paroxysmal atrial fibrillation -EKG from 08/14/2020 showed atrial fibrillation -Heart rate controlled with metoprolol -Continue amiodarone -Continue anticoagulation with apixaban   Hypertension -Losartan is on hold, BP is soft -Continue metoprolol as above   Chronic diastolic CHF Lasix currently on hold  Scheduled Meds:  amiodarone  200 mg Oral BID   apixaban  5 mg Oral BID   FLUoxetine  20 mg Oral QHS   folic acid  1 mg Oral Daily   metoprolol succinate  100 mg Oral Daily   mometasone-formoterol  2 puff Inhalation BID   montelukast  10 mg Oral Daily   multivitamin with minerals  1 tablet Oral Daily   potassium chloride SA  20 mEq Oral BID   pravastatin  20 mg Oral q1800   terazosin  5 mg Oral Daily   Continuous Infusions:  thiamine injection 250 mg (08/18/20 1035)   PRN Meds: acetaminophen **OR** acetaminophen, HYDROcodone-acetaminophen, ipratropium-albuterol, morphine injection, polyethylene glycol, traZODone  Body mass index is 28.19 kg/m.        DVT Prophylaxis:    apixaban (ELIQUIS) tablet 5 mg    Advance goals of care discussion: Pt is Full code.  Family Communication: Daughter was present at bedside, at the time of interview.   Data Reviewed: I have personally reviewed and interpreted daily labs, tele strips, imaging. Sodium 132.  Potassium 4.2.  Serum creatinine stable.  Physical Exam:  General: Appear in mild distress,  no Rash; Oral Mucosa Clear, moist. no Abnormal Neck Mass Or lumps, Conjunctiva normal  Cardiovascular: S1 and S2 Present, no Murmur, Respiratory: good respiratory effort, Bilateral Air entry present and CTA, no Crackles, no wheezes Abdomen: Bowel Sound present, Soft and no tenderness Extremities: no Pedal edema Neurology: alert and oriented to time, place, and person affect appropriate. no new focal deficit Gait not  checked due to patient safety concerns   Vitals:   08/18/20 0914 08/18/20 0919 08/18/20 1306 08/18/20 1704  BP:  106/75 101/78 109/75  Pulse:  67 94 64  Resp:  18 18 14   Temp:  97.9 F (36.6 C) 98.5 F (36.9 C) 98.4 F (36.9 C)  TempSrc:  Oral Oral Oral  SpO2: 93% 96% 97% 95%  Weight:      Height:        Disposition:  Status is: Inpatient  Remains inpatient appropriate because:Altered mental status  Dispo: The patient is from: Home              Anticipated d/c is to: SNF              Patient currently is not medically stable to d/c.   Difficult to place patient No   Time spent: 35 minutes. I reviewed all nursing notes, pharmacy notes, vitals, pertinent old records. I have discussed plan of care as described above with RN.  Author: , MD [facility] 08/18/2020 6:33 PM  To reach On-call, see care teams to locate the attending and reach out via [email or web address]/18/2020. Between 7PM-7AM, please contact night-coverage If you still have difficulty reaching the attending provider, please page the [facility] (Director on Call) for [facility] on amion for assistance.

## 2020-08-18 NOTE — Plan of Care (Signed)

## 2020-08-18 NOTE — Care Management Important Message (Signed)
Important Message  Patient Details  Name: Samuel Barnes MRN: [id number] Date of Birth: 1950-06-19   Medicare Important Message Given:  Yes     Naylin Burkle Stefan Church 08/18/2020, 5:05 PM

## 2020-08-18 NOTE — TOC Initial Note (Signed)
Transition of Care [facility]) - Initial/Assessment Note    Patient Details  Name: Samuel Barnes MRN: [id number] Date of Birth: [birth year]  [facility]) CM/SW Contact:    Kingsley Plan, RN Phone Number: 08/18/2020, 1:16 PM  Clinical Narrative:                 Spoke to patient at bedside. Patient active with [facility] and wants to continue with [facility] . Cory with [facility] aware and updated. Patient has home oxygen with [employer] [facility]. Patient has walker but needs 3 in1 called [employer] for 3 in 1   Expected Discharge Plan: Home w Home Health Services     Patient Goals and CMS Choice Patient states their goals for this hospitalization and ongoing recovery are:: to return to home CMS Medicare.gov Compare Post Acute Care list provided to:: Patient Choice offered to / list presented to : Patient  Expected Discharge Plan and Services Expected Discharge Plan: Home w Home Health Services   Discharge Planning Services: CM Consult Post Acute Care Choice: Home Health, Durable Medical Equipment Living arrangements for the past 2 months: Single Family Home                 DME Arranged: 3-N-1 DME Agency: [employer] Date DME Agency Contacted: 08/18/20 Time DME Agency Contacted: 1315 Representative spoke with at DME Agency: Velna Hatchet HH Arranged: PT HH Agency: [facility] Date [facility] Agency Contacted: 08/18/20 Time HH Agency Contacted: 1316 Representative spoke with at [facility] Agency: Kandee Keen  Prior Living Arrangements/Services Living arrangements for the past 2 months: Single Family Home Lives with:: Self Patient language and need for interpreter reviewed:: Yes Do you feel safe going back to the place where you live?: Yes      Need for Family Participation in Patient Care: Yes (Comment) Care giver support system in place?: Yes (comment) Current home services: DME Criminal Activity/Legal Involvement Pertinent to Current Situation/Hospitalization: No - Comment as  needed  Activities of Daily Living Home Assistive Devices/Equipment: Oxygen, Walker (specify type) ADL Screening (condition at time of admission) Patient's cognitive ability adequate to safely complete daily activities?: Yes Is the patient deaf or have difficulty hearing?: No Does the patient have difficulty seeing, even when wearing glasses/contacts?: No Does the patient have difficulty concentrating, remembering, or making decisions?: Yes Patient able to express need for assistance with ADLs?: Yes Does the patient have difficulty dressing or bathing?: No Independently performs ADLs?: No Communication: Independent Dressing (OT): Needs assistance Is this a change from baseline?: Change from baseline, expected to last >3 days Grooming: Independent Feeding: Independent Bathing: Needs assistance Is this a change from baseline?: Change from baseline, expected to last >3 days Toileting: Needs assistance Is this a change from baseline?: Change from baseline, expected to last >3days In/Out Bed: Needs assistance Is this a change from baseline?: Change from baseline, expected to last >3 days Walks in Home: Independent with device (comment) (walker) Does the patient have difficulty walking or climbing stairs?: No Weakness of Legs: None Weakness of Arms/Hands: None  Permission Sought/Granted   Permission granted to share information with : No              Emotional Assessment Appearance:: Appears stated age Attitude/Demeanor/Rapport: Engaged Affect (typically observed): Accepting Orientation: : Oriented to Self, Oriented to Place, Oriented to  Time, Oriented to Situation Alcohol / Substance Use: Not Applicable Psych Involvement: No (comment)  Admission diagnosis:  Delirium [R41.0] Syncope [R55] New onset a-fib (HCC) [I48.91] Syncope, unspecified  syncope type [R55] Acute metabolic encephalopathy [G93.41] Patient Active Problem List   Diagnosis Date Noted   Acute metabolic  encephalopathy 08/16/2020   Syncope 08/14/2020   Hyperglycemia 07/29/2020   New onset a-fib [facility])    Acute congestive heart failure (HCC)    Acute respiratory failure with hypoxia (HCC) 07/28/2020   Pericardial effusion 07/28/2020   Aortic atherosclerosis (HCC) 07/28/2020   Coronary artery calcification 07/28/2020   Class 2 obesity 07/28/2020   Hyperlipidemia 07/28/2020   Tobacco use 07/28/2020   Prolonged QT interval 07/28/2020   Hypocalcemia 07/28/2020   Alcohol abuse 07/28/2020   Fatty liver 07/28/2020   BPH (benign prostatic hyperplasia)    Depression    Hypertension    Hyponatremia    Hypomagnesemia    New onset atrial fibrillation (HCC)    PCP:  Pcp, No Pharmacy:   [id number] - [place], [place] - [address]OSS [address] [place] [address] Phone: [phone] Fax: [phone]  [facility] [address] [place] [address] Phone: [phone] Fax: [phone]  [facility] ([facility] - [place], [place] - [address] [place] [address] Phone: [phone] Fax: [phone]     Social Determinants of Health (SDOH) Interventions    Readmission Risk Interventions No flowsheet data found.

## 2020-08-18 NOTE — Evaluation (Signed)
Occupational Therapy Evaluation Patient Details Name: Samuel Barnes MRN: [id number] DOB: 1950-02-05 Today's Date: 08/18/2020    History of Present Illness 70yo male admitted 08/14/20 after being found unresponsive at home by family. Note recent hospitalization secondary to CHF and Afib. Admitted for w/u of syncope and acute metabolic encephalopathy. PMH obesity, depression, HLD, HTN, hx cardioversion, CHF, tobacco abuse   Clinical Impression   Pt admitted for concerns above. PTA pt reported that he was independent with all ADL's and IADL's using no DME. At this time, pt very motivated to work with therapy and work on completing ADL's. He requires overall supervision to min guard levels of assist as he has some mild balance deficits. Acute OT will follow to address deficits below.     Follow Up Recommendations  Home health OT    Equipment Recommendations  3 in 1 bedside commode    Recommendations for Other Services       Precautions / Restrictions Precautions Precautions: Fall Restrictions Weight Bearing Restrictions: No      Mobility Bed Mobility Overal bed mobility: Independent             General bed mobility comments: no physical assist, HOB flat    Transfers Overall transfer level: Needs assistance Equipment used: Rolling walker (2 wheeled) Transfers: Sit to/from Stand Sit to Stand: Supervision         General transfer comment: S for safety, cues for hand placement but no physical assist given    Balance Overall balance assessment: Mild deficits observed, not formally tested                                         ADL either performed or assessed with clinical judgement   ADL Overall ADL's : Needs assistance/impaired                                       General ADL Comments: Overall, all ADL's are at supervision-min guard when standing to complete them, Able to complete all tasks and agreeable to  compensatory/energy consesrvation strategies.     Vision Baseline Vision/History: No visual deficits Patient Visual Report: No change from baseline Vision Assessment?: No apparent visual deficits     Perception Perception Perception Tested?: No   Praxis Praxis Praxis tested?: Not tested    Pertinent Vitals/Pain Pain Assessment: No/denies pain     Hand Dominance Right   Extremity/Trunk Assessment Upper Extremity Assessment Upper Extremity Assessment: Overall WFL for tasks assessed   Lower Extremity Assessment Lower Extremity Assessment: Defer to PT evaluation   Cervical / Trunk Assessment Cervical / Trunk Assessment: Normal   Communication Communication Communication: No difficulties   Cognition Arousal/Alertness: Awake/alert Behavior During Therapy: WFL for tasks assessed/performed Overall Cognitive Status: Within Functional Limits for tasks assessed                                 General Comments: very pleasant and more oriented/aware this session.   General Comments  VSS on RA    Exercises     Shoulder Instructions      Home Living Family/patient expects to be discharged to:: Private residence Living Arrangements: Alone Available Help at Discharge: Family;Available PRN/intermittently Type of Home: House Home Access: Stairs to  enter Entrance Stairs-Number of Steps: 1 step in the back; 6-7 in front but doesn't enter through front Entrance Stairs-Rails: None Home Layout: One level     Bathroom Shower/Tub: Producer, television/film/video: Standard Bathroom Accessibility: Yes How Accessible: Accessible via walker Home Equipment: Walker - 2 wheels;Shower seat          Prior Functioning/Environment Level of Independence: Independent        Comments: Has RW but did not use it        OT Problem List: Decreased strength;Decreased activity tolerance;Impaired balance (sitting and/or standing);Decreased safety awareness;Decreased  knowledge of use of DME or AE      OT Treatment/Interventions: Self-care/ADL training;Therapeutic exercise;Energy conservation;DME and/or AE instruction;Therapeutic activities;Balance training;Patient/family education    OT Goals(Current goals can be found in the care plan section) Acute Rehab OT Goals Patient Stated Goal: Return home and get stronger OT Goal Formulation: With patient Time For Goal Achievement: 09/01/20 Potential to Achieve Goals: Good ADL Goals Pt Will Perform Grooming: with modified independence;standing Pt Will Perform Lower Body Bathing: with modified independence;sit to/from stand Pt Will Perform Lower Body Dressing: with modified independence;sitting/lateral leans;sit to/from stand Pt Will Transfer to Toilet: with modified independence;ambulating Pt Will Perform Toileting - Clothing Manipulation and hygiene: with modified independence;sitting/lateral leans;sit to/from stand  OT Frequency: Min 2X/week   Barriers to D/C:            Co-evaluation              AM-PAC OT "6 Clicks" Daily Activity     Outcome Measure Help from another person eating meals?: None Help from another person taking care of personal grooming?: None Help from another person toileting, which includes using toliet, bedpan, or urinal?: A Little Help from another person bathing (including washing, rinsing, drying)?: A Little Help from another person to put on and taking off regular upper body clothing?: None Help from another person to put on and taking off regular lower body clothing?: A Little 6 Click Score: 21   End of Session Equipment Utilized During Treatment: Gait belt;Rolling walker Nurse Communication: Mobility status  Activity Tolerance: Patient tolerated treatment well Patient left: in bed;with call bell/phone within reach  OT Visit Diagnosis: Unsteadiness on feet (R26.81);Other abnormalities of gait and mobility (R26.89);Muscle weakness (generalized) (M62.81)                 Time: 5277-8242 OT Time Calculation (min): 24 min Charges:  OT General Charges $OT Visit: 1 Visit OT Evaluation $OT Eval Moderate Complexity: 1 Mod OT Treatments $Self Care/Home Management : 8-22 mins  Redell Nazir H., OTR/L Acute Rehabilitation  Jaice Lague Elane Bing Plume 08/18/2020, 12:54 PM

## 2020-08-19 ENCOUNTER — Other Ambulatory Visit: Payer: Self-pay

## 2020-08-19 NOTE — Plan of Care (Signed)
  Problem: Elimination: Goal: Will not experience complications related to bowel motility Outcome: Progressing   

## 2020-08-19 NOTE — Progress Notes (Signed)
[facility] PROGRESS NOTE  Patient: Samuel Barnes [facility] HCW:[id number]   PCP: Pcp, No DOB: 05-22-1950   DOA: 08/14/2020   DOS: 08/19/2020    Subjective: No acute complaint.  No acute event overnight.  No nausea no vomiting.  Occasional sundowning.  Objective:  Vitals:   08/19/20 1023 08/19/20 1552  BP: 105/72 122/81  Pulse: 76 71  Resp: 19 16  Temp:  97.9 F (36.6 C)  SpO2: 98% 97%    [place] present. Clear to auscultation. Bowel sound present.  Assessment and plan: Continue IV thiamine.  Today day 3.  Author: Lynden Oxford, MD [facility] 08/19/2020 7:02 PM   If 7PM-7AM, please contact night-coverage at [email or web address]

## 2020-08-20 ENCOUNTER — Inpatient Hospital Stay (HOSPITAL_COMMUNITY): Payer: Medicare Other

## 2020-08-20 NOTE — TOC Progression Note (Signed)
Transition of Care [facility]) - Progression Note    Patient Details  Name: Samuel Barnes MRN: [id number] Date of Birth: 06/26/1950  [facility]) CM/SW Contact  Anara Cowman, [place], [place] Phone Number:[phone] 08/20/2020, 11:27 AM  Clinical Narrative:    Phone call to patient's daughter to discuss discharge plan. Patient's daughter requesting resources for patient for home care as he lives alone. It was confirmed that patient will discharge with [facility] services. In home aid services also discussed as an out of pocket expense.  Emergency alert system with cameras discussed for safety as well as Meals on Wheels-contact number provided for additional support.   Transition of Care to continue to follow   [facility], LCSW Transition of Care [phone]    Expected Discharge Plan: Home w Home Health Services    Expected Discharge Plan and Services Expected Discharge Plan: Home w Home Health Services   Discharge Planning Services: CM Consult Post Acute Care Choice: Home Health, Durable Medical Equipment Living arrangements for the past 2 months: Single Family Home                 DME Arranged: 3-N-1 DME Agency: [employer] Date DME Agency Contacted: 08/18/20 Time DME Agency Contacted: 1315 Representative spoke with at DME Agency: Velna Hatchet HH Arranged: PT HH Agency: [facility] Date [facility] Agency Contacted: 08/18/20 Time HH Agency Contacted: 1316 Representative spoke with at [facility] Agency: Kandee Keen   Social Determinants of Health (SDOH) Interventions    Readmission Risk Interventions No flowsheet data found.

## 2020-08-20 NOTE — Progress Notes (Signed)
[facility] PROGRESS NOTE  Patient: Samuel Barnes [facility] KJZ:[id number]   PCP: Pcp, No DOB: 26-Apr-1950   DOA: 08/14/2020   DOS: 08/20/2020    Subjective: no new complains, some shortness of breath   Objective:  Vitals:   08/20/20 0904 08/20/20 1158  BP:  (!) 136/92  Pulse:  75  Resp:  18  Temp:  97.8 F (36.6 C)  SpO2: 99% 98%    General: Appear in mild distress, no Rash; Oral Mucosa Clear, moist. no Abnormal Neck Mass Or lumps, Conjunctiva normal  Cardiovascular: S1 and S2 Present, no Murmur, Respiratory: good respiratory effort, Bilateral Air entry present and CTA, no Crackles, no wheezes Abdomen: Bowel Sound present, Soft and no tenderness Extremities: no Pedal edema Neurology: alert and oriented to time, place, and person affect appropriate. no new focal deficit Gait not checked due to patient safety concerns    Assessment and plan: Continue thiamine for wernicke's  Author: Lynden Oxford, MD [facility] 08/20/2020 7:32 PM   If 7PM-7AM, please contact night-coverage at [email or web address]

## 2020-08-21 ENCOUNTER — Ambulatory Visit: Payer: Medicare Other | Admitting: Cardiology

## 2020-08-21 LAB — BASIC METABOLIC PANEL
Anion gap: 9 (ref 5–15)
BUN: 14 mg/dL (ref 8–23)
CO2: 21 mmol/L — ABNORMAL LOW (ref 22–32)
Calcium: 8.7 mg/dL — ABNORMAL LOW (ref 8.9–10.3)
Chloride: 101 mmol/L (ref 98–111)
Creatinine, Ser: 0.65 mg/dL (ref 0.61–1.24)
GFR, Estimated: >60 mL/min (ref >60)
Glucose, Bld: 100 mg/dL — ABNORMAL HIGH (ref 70–99)
Potassium: 4.7 mmol/L (ref 3.5–5.1)
Sodium: 131 mmol/L — ABNORMAL LOW (ref 135–145)

## 2020-08-21 LAB — CBC
HCT: 45.6 % (ref 39.0–52.0)
Hemoglobin: 15.2 g/dL (ref 13.0–17.0)
MCH: 32.3 pg (ref 26.0–34.0)
MCHC: 33.3 g/dL (ref 30.0–36.0)
MCV: 97 fL (ref 80.0–100.0)
Platelets: 266 10*3/uL (ref 150–400)
RBC: 4.7 MIL/uL (ref 4.22–5.81)
RDW: 12.7 % (ref 11.5–15.5)
WBC: 10.9 10*3/uL — ABNORMAL HIGH (ref 4.0–10.5)
nRBC: 0 % (ref 0.0–0.2)

## 2020-08-21 MED ORDER — THIAMINE HCL 100 MG PO TABS: 100.0000 mg | ORAL_TABLET | Freq: Every day | ORAL | 0 refills | Status: DC

## 2020-08-21 MED ORDER — POLYETHYLENE GLYCOL 3350 17 G PO PACK: 17.0000 g | PACK | Freq: Every day | ORAL | 0 refills | Status: AC | PRN

## 2020-08-21 NOTE — Progress Notes (Signed)
Patient discharged to home with daughter via wheelchair with all belongings. Patients upper and lower dentures placed in mouth before leaving. IV removed. Home oxygen also delivered and used for discharge.

## 2020-08-21 NOTE — Progress Notes (Signed)
Physical Therapy Treatment Patient Details Name: Samuel Barnes MRN: [id number] DOB: [birth year] Today's Date: 08/21/2020    History of Present Illness 70yo male admitted 08/14/20 after being found unresponsive at home by family. Note recent hospitalization secondary to CHF and Afib. Admitted for w/u of syncope and acute metabolic encephalopathy. PMH obesity, depression, HLD, HTN, hx cardioversion, CHF, tobacco abuse.    PT Comments    Pt eager to d/c home, agreeable to working with PT prior to d/c. Pt ambulatory in hallway with RW, overall requiring close guard for safety and safety awareness cues during mobility. Pt maintained sats 92% and greater on 2LO2, PT educated pt on pursed lip breathing and energy conservation for use when home as needed, pt expresses understanding. PT recommending 24/7 supervision once home if possible, as PT has concerns about pt safety and fall risk. Pt's daughter at bedside, will help father PRN.     Follow Up Recommendations  Home health PT;Supervision/Assistance - 24 hour     Equipment Recommendations  Rolling walker with 5" wheels    Recommendations for Other Services       Precautions / Restrictions Precautions Precautions: Fall Restrictions Weight Bearing Restrictions: No    Mobility  Bed Mobility Overal bed mobility: Needs Assistance Bed Mobility: Rolling;Supine to Sit     Supine to sit: Supervision Sit to supine: Supervision   General bed mobility comments: no physical assist, HOB elevated    Transfers Overall transfer level: Needs assistance Equipment used: Rolling walker (2 wheeled) Transfers: Sit to/from Stand Sit to Stand: Supervision         General transfer comment: for safety, cues for hand placement when rising/sitting.  Ambulation/Gait Ambulation/Gait assistance: Min guard Gait Distance (Feet): 150 Feet Assistive device: Rolling walker (2 wheeled) Gait Pattern/deviations: Step-through pattern;Trunk  flexed;Decreased stride length Gait velocity: decr   General Gait Details: close guard for safety, verbal cuing for placement in RW, upright posture multiple times during gait.   Stairs             Wheelchair Mobility    Modified Rankin (Stroke Patients Only)       Balance Overall balance assessment: Mild deficits observed, not formally tested Sitting-balance support: Bilateral upper extremity supported;No upper extremity supported Sitting balance-Leahy Scale: Good Sitting balance - Comments: Intermittently alternates between UEs and uses BUEs for support while performing functional activity EOB   Standing balance support: Bilateral upper extremity supported;During functional activity Standing balance-Leahy Scale: Poor Standing balance comment: reliant on RW for external support dynamically                            Cognition Arousal/Alertness: Awake/alert Behavior During Therapy: WFL for tasks assessed/performed Overall Cognitive Status: Impaired/Different from baseline Area of Impairment: Safety/judgement;Problem solving                   Current Attention Level: Selective Memory: Decreased short-term memory Following Commands: Follows one step commands with increased time Safety/Judgement: Decreased awareness of safety Awareness: Intellectual Problem Solving: Slow processing;Difficulty sequencing;Requires verbal cues General Comments: Pt with impairment in safety awareness and safety during mobility, requires cues for safety during mobility      Exercises      General Comments        Pertinent Vitals/Pain Pain Assessment: Faces Faces Pain Scale: No hurt Pain Intervention(s): Monitored during session    Home Living  Prior Function            PT Goals (current goals can now be found in the care plan section) Acute Rehab PT Goals Patient Stated Goal: Return home and get stronger PT Goal Formulation:  With patient Time For Goal Achievement: 08/31/20 Potential to Achieve Goals: Good Progress towards PT goals: Progressing toward goals    Frequency    Min 3X/week      PT Plan Current plan remains appropriate    Co-evaluation              AM-PAC PT "6 Clicks" Mobility   Outcome Measure  Help needed turning from your back to your side while in a flat bed without using bedrails?: None Help needed moving from lying on your back to sitting on the side of a flat bed without using bedrails?: None Help needed moving to and from a bed to a chair (including a wheelchair)?: A Little Help needed standing up from a chair using your arms (e.g., wheelchair or bedside chair)?: A Little Help needed to walk in hospital room?: A Little Help needed climbing 3-5 steps with a railing? : A Little 6 Click Score: 20    End of Session Equipment Utilized During Treatment: Gait belt Activity Tolerance: Patient tolerated treatment well Patient left: with call bell/phone within reach;in bed;with bed alarm set Nurse Communication: Mobility status PT Visit Diagnosis: Unsteadiness on feet (R26.81);Muscle weakness (generalized) (M62.81)     Time: 5797-2820 PT Time Calculation (min) (ACUTE ONLY): 19 min  Charges:  $Gait Training: 8-22 mins                    Samuel Barnes, PT DPT Acute Rehabilitation Services Pager [phone]  Office [phone]     Samuel Barnes 08/21/2020, 3:28 PM

## 2020-08-21 NOTE — Progress Notes (Signed)
Occupational Therapy Treatment Patient Details Name: Samuel Barnes MRN: [id number] DOB: 01/14/1950 Today's Date: 08/21/2020    History of present illness 70yo male admitted 08/14/20 after being found unresponsive at home by family. Note recent hospitalization secondary to CHF and Afib. Admitted for w/u of syncope and acute metabolic encephalopathy. PMH obesity, depression, HLD, HTN, hx cardioversion, CHF, tobacco abuse   OT comments  Pt presented in bed. Pt was able to complete bed mobility with supervision with HOB elevated and bed rails. Pt was not aware about they were soiled at the time and reported "I sweat a lot". Pt required cueing to donned clean undergarments prior to doffing soiled undergarments and hygiene. Pt then required cued to hygiene self in standing post set up. Pt then when attempting to don clean pants needed increase time and cues to problem solve the front to the back. Pt's daughter will be able to assist but not 24/7 at this time but recommended. Pt and daughter educated about an aide to assist but they would like for the patient to go to SNF level. Pt currently with functional limitations due to the deficits listed below (see OT Problem List).  Pt will benefit from skilled OT to increase their safety and independence with ADL and functional mobility for ADL to facilitate discharge to venue listed below.    Follow Up Recommendations  Home health OT;Supervision/Assistance - 24 hour;Other (comment) (Max HH therapy and aide, discussed with family to look into ALF and private aide)    Equipment Recommendations  3 in 1 bedside commode    Recommendations for Other Services      Precautions / Restrictions Precautions Precautions: Fall Restrictions Weight Bearing Restrictions: No       Mobility Bed Mobility Overal bed mobility: Needs Assistance Bed Mobility: Rolling;Supine to Sit     Supine to sit: Supervision Sit to supine: Supervision   General bed mobility  comments: no physical assist, HOB elevated    Transfers Overall transfer level: Needs assistance Equipment used: Rolling walker (2 wheeled) Transfers: Sit to/from Stand Sit to Stand: Supervision         General transfer comment: cues for saftey and needing repeated instructions    Balance Overall balance assessment: Mild deficits observed, not formally tested Sitting-balance support: Bilateral upper extremity supported;No upper extremity supported Sitting balance-Leahy Scale: Good Sitting balance - Comments: Intermittently alternates between UEs and uses BUEs for support while performing functional activity EOB   Standing balance support: Bilateral upper extremity supported;During functional activity Standing balance-Leahy Scale: Fair Standing balance comment: benefits from BUE support dynamically                           ADL either performed or assessed with clinical judgement   ADL Overall ADL's : Needs assistance/impaired Eating/Feeding: Independent;Sitting   Grooming: Wash/dry hands;Wash/dry face;Supervision/safety;Sitting;Standing   Upper Body Bathing: Cueing for safety;Cueing for sequencing;Sitting;Supervision/ safety   Lower Body Bathing: Minimal assistance;Sit to/from stand;Cueing for safety;Cueing for sequencing   Upper Body Dressing : Supervision/safety;Sitting;Standing   Lower Body Dressing: Minimal assistance;Sit to/from stand;Cueing for safety;Cueing for sequencing   Toilet Transfer: Supervision/safety;Cueing for safety;Cueing for sequencing;RW   Toileting- Clothing Manipulation and Hygiene: Minimal assistance;Cueing for safety;Cueing for sequencing;Sit to/from stand   Tub/ Shower Transfer: Minimal assistance;Cueing for safety;Cueing for sequencing;Ambulation;Shower seat   Functional mobility during ADLs: Min guard;Cueing for safety;Cueing for sequencing;Rolling walker       Vision   Vision Assessment?: No apparent visual deficits  Perception     Praxis      Cognition Arousal/Alertness: Awake/alert Behavior During Therapy: WFL for tasks assessed/performed Overall Cognitive Status: Impaired/Different from baseline Area of Impairment: Safety/judgement;Problem solving                   Current Attention Level: Selective Memory: Decreased short-term memory Following Commands: Follows one step commands inconsistently Safety/Judgement: Decreased awareness of safety Awareness: Intellectual Problem Solving: Slow processing;Difficulty sequencing;Requires verbal cues          Exercises     Shoulder Instructions       General Comments      Pertinent Vitals/ Pain       Pain Assessment: No/denies pain  Home Living                                          Prior Functioning/Environment              Frequency  Min 2X/week        Progress Toward Goals  OT Goals(current goals can now be found in the care plan section)  Progress towards OT goals: Progressing toward goals  Acute Rehab OT Goals Patient Stated Goal: return home OT Goal Formulation: With patient Time For Goal Achievement: 09/01/20 Potential to Achieve Goals: Good ADL Goals Pt Will Perform Grooming: with modified independence;standing Pt Will Perform Lower Body Bathing: with modified independence;sit to/from stand Pt Will Perform Lower Body Dressing: with modified independence;sitting/lateral leans;sit to/from stand Pt Will Transfer to Toilet: with modified independence;ambulating Pt Will Perform Toileting - Clothing Manipulation and hygiene: with modified independence;sitting/lateral leans;sit to/from stand  Plan Discharge plan needs to be updated    Co-evaluation                 AM-PAC OT "6 Clicks" Daily Activity     Outcome Measure   Help from another person eating meals?: None Help from another person taking care of personal grooming?: None Help from another person toileting, which  includes using toliet, bedpan, or urinal?: A Little Help from another person bathing (including washing, rinsing, drying)?: A Little Help from another person to put on and taking off regular upper body clothing?: None Help from another person to put on and taking off regular lower body clothing?: A Little 6 Click Score: 21    End of Session Equipment Utilized During Treatment: Gait belt;Rolling walker  OT Visit Diagnosis: Unsteadiness on feet (R26.81);Other abnormalities of gait and mobility (R26.89);Muscle weakness (generalized) (M62.81)   Activity Tolerance Patient tolerated treatment well   Patient Left in bed;with call bell/phone within reach   Nurse Communication Mobility status        Time: 8563-1497 OT Time Calculation (min): 37 min  Charges: OT General Charges $OT Visit: 1 Visit OT Treatments $Self Care/Home Management : 23-37 mins  Alphia Moh OTR/L  Acute Rehab Services  [phone] office number [phone] pager number    Alphia Moh 08/21/2020, 12:39 PM

## 2020-08-21 NOTE — TOC Transition Note (Signed)
Transition of Care [facility]) - CM/SW Discharge Note   Patient Details  Name: Samuel Barnes MRN: [id number] Date of Birth: [birth year]  Transition [facility]) CM/SW Contact:  Epifanio Lesches, RN Phone Number: 08/21/2020, 12:15 PM   Clinical Narrative:    Patient will DC to: Home Anticipated DC date: 08/21/2020 Family notified: yes Transport by: car  Admitted with Syncope. Hx of  CHF with chronic hypoxemic respiratory failure / 3 L/min oxygen, atrial fibrillation/Eliquis, BPH, CAD, hyperlipidemia, tobacco use, obesity, BPH, depression. Resides with daughter.  Per MD patient ready for DC today. RN, patient, patient's daughter and [facility] notified of DC. Referral made with [employer] for portable oxygen tank for ride to home. Oxygen tank will be delivered to bedside prior to d/c.  Per daughter pt without Rx MED concerns. Post hospital f/u noted on AVS.  Samuel Barnes (Daughter)       [phone]      Daughter to provide transportation to home.  RNCM will sign off for now as intervention is no longer needed. Please consult [place] again if new needs arise.    Final next level of care: Home w Home Health Services Barriers to Discharge: No Barriers Identified   Patient Goals and CMS Choice Patient states their goals for this hospitalization and ongoing recovery are:: to return to home CMS Medicare.gov Compare Post Acute Care list provided to:: Patient Choice offered to / list presented to : Patient  Discharge Placement                       Discharge Plan and Services   Discharge Planning Services: CM Consult Post Acute Care Choice: Home Health, Durable Medical Equipment          DME Arranged: 3-N-1 DME Agency: [employer] Date DME Agency Contacted: 08/18/20 Time DME Agency Contacted: 1315 Representative spoke with at DME Agency: Velna Hatchet HH Arranged: PT HH Agency: [facility] Date [facility] Agency Contacted: 08/18/20 Time HH Agency  Contacted: 1316 Representative spoke with at [facility] Agency: Kandee Keen  Social Determinants of Health (SDOH) Interventions     Readmission Risk Interventions No flowsheet data found.

## 2020-08-22 ENCOUNTER — Telehealth (HOSPITAL_COMMUNITY): Payer: Self-pay

## 2020-08-22 ENCOUNTER — Other Ambulatory Visit (HOSPITAL_COMMUNITY): Payer: Self-pay

## 2020-08-22 NOTE — Progress Notes (Signed)
Synopsis: Referred for follow up of acute on chronic hypoxic respiratory failure by Glade Lloyd, MD  Subjective:   PATIENT ID: Samuel Barnes GENDER: male DOB: 03/09/1950, MRN: [id number]  Chief Complaint  Patient presents with   Consult    Referred by Dr. Autumn Patty resting makes it better and moving makes it worse.Cough clear sputum.Hospitalize on 8/8.     70yM with HTN, chart history of COPD but no PFTs - on dulera/albuterol since hospitalization, smoking 50+ pack-year, recently discovered AF, diastolic dysfunction who is seen in follow up after recent hospitalization for acute on chronic diastolic heart failure with new onset AF s/p TEE/DCCV and initiation of amiodarone/DOAC, found to also have pleural effusion during hospitalization. Diuresed about 20 lb!  Had thoracentesis 07/31/20, exudative. Notably had a bunch of remote healed R rib fx seen on CTA Chest 07/28/20. Had thoracentesis about 3 years ago after a fall and broken ribs - also bloody-appearing.   He has had no AECOPD this year, has been prescribed prednisone at least once in remote past for AECOPD - he does think he had a symptomatic response to it. Does think breathing treatments have been helpful - nebulizer is easier for him to use.   Otherwise pertinent review of systems is negative.  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 No pets at home, never has lived outside of [place].  Father had lung cancer.   Past Medical History:  Diagnosis Date   Aortic atherosclerosis (HCC) 07/28/2020   BPH (benign prostatic hyperplasia)    Class 2 obesity 07/28/2020   Coronary artery calcification 07/28/2020   Depression    Family history of adverse reaction to anesthesia    Hyperlipidemia 07/28/2020   Hypertension    Tobacco use 07/28/2020     Family History  Problem Relation Age of Onset   Coronary artery disease Mother     Hypertension Mother    Hyperlipidemia Mother    Ovarian cancer Mother    Ovarian cancer Sister    Liver cancer Sister    Liver cancer Brother      Past Surgical History:  Procedure Laterality Date   CARDIOVERSION N/A 08/02/2020   Procedure: CARDIOVERSION;  Surgeon: Meriam Sprague, MD;  Location: [facility] ENDOSCOPY;  Service: Cardiovascular;  Laterality: N/A;   IR THORACENTESIS ASP PLEURAL SPACE W/IMG GUIDE  07/31/2020   TEE WITHOUT CARDIOVERSION N/A 08/02/2020   Procedure: TRANSESOPHAGEAL ECHOCARDIOGRAM (TEE);  Surgeon: Meriam Sprague, MD;  Location: [facility] ENDOSCOPY;  Service: Cardiovascular;  Laterality: N/A;   TRANSURETHRAL RESECTION OF PROSTATE      Social History   Socioeconomic History   Marital status: Divorced    Spouse name: Not on file   Number of children: Not on file   Years of education: Not on file   Highest education level: Not on file  Occupational History   Not on file  Tobacco Use   Smoking status: Former    Types: Cigarettes    Start date: 1969    Quit date: 08/14/2020    Years since quitting: 0.0   Smokeless tobacco: Never  Vaping Use   Vaping Use: Never used  Substance and Sexual Activity   Alcohol use: Yes    Alcohol/week: 1.0 standard drink    Types: 1 Shots of liquor per week    Comment: 3-4 times a week   Drug use: Never   Sexual activity:  Not on file  Other Topics Concern   Not on file  Social History Narrative   Not on file   Social Determinants of Health   Financial Resource Strain: Not on file  Food Insecurity: Not on file  Transportation Needs: Not on file  Physical Activity: Not on file  Stress: Not on file  Social Connections: Not on file  Intimate Partner Violence: Not on file     Allergies  Allergen Reactions   Ciprofloxacin Shortness Of Breath and Other (See Comments)    Numbness in extremities    Gabapentin Other (See Comments)    Mental problems, depression, anger     Outpatient Medications Prior to Visit   Medication Sig Dispense Refill   albuterol (VENTOLIN HFA) 108 (90 Base) MCG/ACT inhaler Inhale 2 puffs into the lungs every 4 (four) hours as needed for wheezing or shortness of breath. 8.5 g 0   amiodarone (PACERONE) 200 MG tablet Take 1 tablet (200 mg) by mouth twice daily till 08/17/2020 and then take 1 tablet (200 mg) once daily from 08/18/2020 onwards as per cardiology 30 tablet 0   apixaban (ELIQUIS) 5 MG TABS tablet Take 1 tablet (5 mg total) by mouth 2 (two) times daily. 60 tablet 0   FLUoxetine (PROZAC) 20 MG capsule Take 20 mg by mouth at bedtime.     folic acid (FOLVITE) 1 MG tablet Take 1 tablet (1 mg total) by mouth daily. 30 tablet 0   lovastatin (MEVACOR) 20 MG tablet Take 20 mg by mouth daily.     metoprolol succinate (TOPROL-XL) 100 MG 24 hr tablet Take 1 tablet (100 mg total) by mouth daily. Take with or immediately following a meal. 30 tablet 0   montelukast (SINGULAIR) 10 MG tablet Take 10 mg by mouth daily.     Multiple Vitamin (MULTIVITAMIN WITH MINERALS) TABS tablet Take 1 tablet by mouth daily.     polyethylene glycol (MIRALAX / GLYCOLAX) 17 g packet Take 17 g by mouth daily as needed for mild constipation. 14 each 0   terazosin (HYTRIN) 5 MG capsule Take 5 mg by mouth daily.     thiamine 100 MG tablet Take 1 tablet (100 mg total) by mouth daily. 120 tablet 0   mometasone-formoterol (DULERA) 100-5 MCG/ACT AERO Inhale 2 puffs into the lungs 2 (two) times daily. 26 g 0   No facility-administered medications prior to visit.       Objective:   Physical Exam:  General appearance: 70 y.o., male, NAD, conversant  Eyes: anicteric sclerae, moist conjunctivae; no lid-lag; PERRL, tracking appropriately HENT: NCAT; oropharynx, MMM, no mucosal ulcerations; normal hard and soft palate Neck: Trachea midline; no lymphadenopathy, no JVD Lungs: Diminished bilaterally, no crackles, no wheeze, with normal respiratory effort on 2L O2 via [place] CV: tachycardic,IRIR, no MRGs  Abdomen:  Soft, non-tender; non-distended, BS present  Extremities: No peripheral edema, radial and DP pulses present bilaterally  Skin: Normal temperature, turgor and texture; no rash Psych: Appropriate affect Neuro: Alert and oriented to person and place, no focal deficit    Vitals:   08/23/20 1112  BP: 100/62  Pulse: (!) 108  SpO2: 95%  Weight: 193 lb 3.2 oz (87.6 kg)  Height: 5\' 9"  (1.753 m)   95% on 2 LPM O2 BMI Readings from Last 3 Encounters:  08/23/20 28.53 kg/m  08/21/20 29.23 kg/m  08/11/20 31.51 kg/m   Wt Readings from Last 3 Encounters:  08/23/20 193 lb 3.2 oz (87.6 kg)  08/21/20 192 lb 3.9  oz (87.2 kg)  08/11/20 207 lb 3.7 oz (94 kg)     CBC    Component Value Date/Time   WBC 10.9 (H) 08/21/2020 0400   RBC 4.70 08/21/2020 0400   HGB 15.2 08/21/2020 0400   HCT 45.6 08/21/2020 0400   PLT 266 08/21/2020 0400   MCV 97.0 08/21/2020 0400   MCH 32.3 08/21/2020 0400   MCHC 33.3 08/21/2020 0400   RDW 12.7 08/21/2020 0400   LYMPHSABS 0.7 08/14/2020 1025   MONOABS 0.8 08/14/2020 1025   EOSABS 0.0 08/14/2020 1025   BASOSABS 0.0 08/14/2020 1025    Pleural fluid 07/31/20: LD 111 (ratio >0.6) Protein <3 Culture neg  Cytology: mixed acute and chronic inflammation. Grossly dark red in color.  Chest Imaging: CXR 8/14 reviewed by me and remarkable for decreased size of now small R pleural effusion, old healing rib fx on right  CT A/P 8/8 reviewed by me and remarkable for R small-moderate effusion and atelectasis  CTA Chest 07/28/20: reviewed by me and remarkable for R effusion, saber sheath trachea, small pericardial effusion  CT [facility] OSH report reviewed by me and remarkable for presence of subacute healing fractures of right 4th-9th ribs, rounded atelectasis in right lung base   Pulmonary Functions Testing Results: No flowsheet data found. None    Echocardiogram:   TTE 07/29/20 with dilated IVC, AF could not assess DD  TEE 7/27 dilated LA      Assessment &  Plan:   # Chronic hypoxic respiratory failure: likely combination of volume overload from CHF, RLL atelectasis, perhaps some distal airways plugging from chronic airway inflammation. Doesn't have much if any emphysema radiographically.  # Dyspnea on exertion: probably multifactorial with CHF, AF, likely COPD, deconditioning. IF he has COPD doesn't sound like he's an exacerbator, will start with LAMA monotherapy given recent rate trouble with AF.   # Smoking: congratulated him on his effort to stop smoking on his own  # R exudative pleural effusion: has been sampled twice. Cyto negative 07/28/20, unclear if sent on thora 3 years ago at [facility]. This is likely chronic sequel of his traumatic fall/rib fractures 3 years ago given timing and fluid characteristics.   Plan: - PFTs in a month after recovering a bit from hospitalization - referral to lung cancer screening program placed today - smoking cessation strongly encouraged, congratulated effort so far - stop dulera and switch to spiriva handihaler 1 puff once daily - albuterol inhaler or neb as needed for rescue - walking oximetry at next visit - discuss cardiac vs pulmonary rehab at next visit     Omar Person, MD [facility] 08/23/2020 11:50 AM

## 2020-08-22 NOTE — Telephone Encounter (Signed)
Pharmacy Transitions of Care Follow-up Telephone Call  Date of discharge: 08/21/20  Discharge Diagnosis: Afib/Delirium  How have you been since you were released from the hospital?  Spoke with daughter Wynona Canes on the phone. No questions about meds at this time. Patient has been having occasional headaches and is still a little confused but that is normal for him according to daughter.  Medication changes made at discharge:     START taking: polyethylene glycol (MIRALAX / GLYCOLAX)  STOP taking: furosemide 80 MG tablet (LASIX)  ipratropium-albuterol 0.5-2.5 (3) MG/3ML Soln (DUONEB)  losartan 50 MG tablet (COZAAR)  potassium chloride SA 20 MEQ tablet (KLOR-CON)   Medication changes verified by the patient? Yes    Medication Accessibility:  Home Pharmacy: not discussed   Was the patient provided with refills on discharged medications? No   Have all prescriptions been transferred from [facility] to home pharmacy? N/A   Is the patient able to afford medications? Patient has [employer] insurance    Medication Review:  APIXABAN (ELIQUIS)  Apixaban 5 mg BID initiated on 08/08/20 - Discussed importance of taking medication around the same time everyday  - Advised patient of medications to avoid (NSAIDs, ASA)  - Educated that Tylenol (acetaminophen) will be the preferred analgesic to prevent risk of bleeding  - Emphasized importance of monitoring for signs and symptoms of bleeding (abnormal bruising, prolonged bleeding, nose bleeds, bleeding from gums, discolored urine, black tarry stools)  - Advised patient to alert all providers of anticoagulation therapy prior to starting a new medication or having a procedure   Follow-up Appointments:  PCP Hospital f/u appt confirmed? Scheduled to see Dr. Shelle Iron on 08/30/20 @ Internal Med.   Specialist Hospital f/u appt confirmed? Scheduled to see Dr. Thora Lance on 08/23/20 @ Pulmonology.   If their condition worsens, is the pt aware to call PCP or go to the  Emergency Dept.? Yes  Final Patient Assessment: Has follow up scheduled and knows to get refills at future follow up

## 2020-08-22 NOTE — Discharge Summary (Signed)
[facility] Discharge Summary   Patient: Samuel Barnes QMV:[id number]  PCP: Pcp, No  Date of admission: 08/14/2020   Date of discharge: 08/21/2020      Discharge Diagnoses:  Principal diagnosis Wernicke's encephalopathy  Active Problems:   Syncope   Acute metabolic encephalopathy Alcohol abuse  Admitted From: Home Disposition:  Home with home health  Recommendations for Outpatient Follow-up:  PCP: Follow-up with PCP in 1 week   Follow-up Information     [facility], [facility] Follow up.   Specialty: Home Health Services Contact information: [address] [place] [address] [phone]         [name], MD. Schedule an appointment as soon as possible for a visit in 1 week(s).   Specialty: Cardiology Contact information: [address] [place] [address] [phone]                Discharge Instructions     Amb referral to [facility]   Complete by: As directed    Diet general   Complete by: As directed    Discharge instructions   Complete by: As directed    It is important that you read the instructions as well as go over your medication list with RN to help you understand your care after this hospitalization.  Please follow-up with PCP in 1-2 weeks.  Please note that we are unable to authorize any refills for discharge medications, once you are discharged. Thus, it is imperative that you return to your primary care physician (or establish a relationship with a primary care physician if you do not have one) for your care needs. So that they can reassess your need for medications and monitor your lab values.  Please request your primary care physician to go over all Hospital Tests and Procedure/Radiological results at the follow up. Please get all Hospital records sent to your PCP by signing hospital release before you go home.   Do not drive, operating heavy machinery, perform activities at heights,  swimming or participation in water activities or provide baby sitting services; until you have been seen by Primary Care Physician and are cleared to do such activities.  Do not take more than prescribed Pain, Sleep and Anxiety Medications.  You were cared for by a hospitalist during your hospital stay. If you have any questions about your discharge medications or the care you received while you were in the hospital after you are discharged, you can call the hospital unit/floor you were admitted to and ask to speak with the hospitalist who took care of you. Ask for Hospitalist on call, if the hospitalist that took care of you is not available. Once you are discharged, your primary care physician will help you with any further medical issues. You Must read complete instructions/literature along with all the possible adverse reactions/side effects for all the Medicines you take and that have been prescribed to you. Take any new Medicines after you have completely understood and accept all the possible adverse reactions/side effects. If you have smoked or chewed Tobacco, please STOP smoking. please STOP any Recreational drug use. If you drink alcohol, please safely STOP the use. Do not drive, operating heavy machinery, perform activities at heights, swimming or participation in water activities or provide baby sitting services under influence.   Increase activity slowly   Complete by: As directed        Diet recommendation: Regular diet  Activity: The patient is advised to  gradually reintroduce usual activities, as tolerated  Discharge Condition: stable  Code Status: Full code   History of present illness: As per the H and P dictated on admission, "Samuel Barnes is a 70 y.o. male with a past medical history of CHF with chronic hypoxic respiratory failure on 3 L oxygen at baseline, atrial fibrillation on Eliquis, BPH, coronary artery disease, hyperlipidemia, tobacco use, obesity, BPH,  depression.   The patient presented to the emergency department due to a syncopal episode today and altered mental status.  He lives alone at home.  But daughter is present at bedside states that the patient was found outside on the deck this morning. She stated this is unusual for him to go outside to smoke in the morning but when she found him he was just sitting there and not responding verbally with his head down.  Stated this went on for approximately 30 minutes. She shook him and yelled at him but he would not wake up.  When EMS arrived they could not get him respond as well.  When he was moved onto the stretcher by EMS he did wake up and start answering questions.  Daughter states this is unusual for him and this all started approximately 1 week ago since his previous hospitalization.  At that time he was admitted for CHF exacerbation and for A. fib.  Upon my evaluation he is alert and oriented x3.  Able to move all 4 extremities.  No focal neurological deficits.  No hallucinations.  No previous history of seizure disorder.  Daughter states that he requires a lot of prompting to do tasks now.  Difficulty using a fork at home to eat himself.  He has been also more forgetful lately.  Does recognize family.  He has been dropping things more often.  He did have a history of alcohol abuse but has not been drinking for the past few weeks per daughter.  Approximately 3 weeks she states. She states that he has unsteady gait as well."  Hospital Course:  Summary of his active problems in the hospital is as following. Syncope -Patient had episode of unresponsiveness -No significant event on telemetry -Patient had TEE on 08/02/2020, which showed EF of 55 to 60%, no left atrial appendage thrombus detected.  Patient had DCCV performed after TEE -MRI brain negative.  EEG negative.  No focal deficit seen and examined as well.  No further work-up.   Wernicke's encephalopathy -Patient has been having cognitive  decline for past few weeks -CT head is unremarkable -MRI brain no evidence of acute stroke as well. -Treated with IV thiamine 500 mg 3 times a day followed by to 50 mg daily for 5 days. B1 level performed at the time of admission was done in error. B12 level was also relatively low. Supplemented with subcu B12 injections. Oral intake improved significantly therefore will not continue further B12 injection Continue oral MV Recommend outpatient follow-up with PCP. Appreciate neurology consultation. MRI brain negative for any acute abnormality. No further work-up.   Paroxysmal atrial fibrillation -EKG from 08/14/2020 showed atrial fibrillation -Heart rate controlled with metoprolol -Continue amiodarone -Continue anticoagulation with apixaban   Hypertension Hold Lasix, hold losartan, continue other medication.   Chronic diastolic CHF Volume stable.  Do not think the patient requires Lasix on discharge.  Will discontinue.  Patient was seen by physical therapy, who recommended Home Health, . On the day of the discharge the patient's vitals were stable, and no other new acute medical condition  were reported. The patient was felt safe to be discharge at Home with Home health.  Consultants: Neurology  Procedures: EEG  DISCHARGE MEDICATION: Allergies as of 08/21/2020       Reactions   Ciprofloxacin Shortness Of Breath, Other (See Comments)   Numbness in extremities   Gabapentin Other (See Comments)   Mental problems, depression, anger        Medication List     STOP taking these medications    furosemide 80 MG tablet Commonly known as: LASIX   ipratropium-albuterol 0.5-2.5 (3) MG/3ML Soln Commonly known as: DUONEB   losartan 50 MG tablet Commonly known as: COZAAR   potassium chloride SA 20 MEQ tablet Commonly known as: KLOR-CON       TAKE these medications    albuterol 108 (90 Base) MCG/ACT inhaler Commonly known as: VENTOLIN HFA Inhale 2 puffs into the lungs  every 4 (four) hours as needed for wheezing or shortness of breath.   amiodarone 200 MG tablet Commonly known as: PACERONE Take 1 tablet (200 mg) by mouth twice daily till 08/17/2020 and then take 1 tablet (200 mg) once daily from 08/18/2020 onwards as per cardiology   Dulera 100-5 MCG/ACT Aero Generic drug: mometasone-formoterol Inhale 2 puffs into the lungs 2 (two) times daily.   Eliquis 5 MG Tabs tablet Generic drug: apixaban Take 1 tablet (5 mg total) by mouth 2 (two) times daily.   FLUoxetine 20 MG capsule Commonly known as: PROZAC Take 20 mg by mouth at bedtime.   folic acid 1 MG tablet Commonly known as: FOLVITE Take 1 tablet (1 mg total) by mouth daily.   lovastatin 20 MG tablet Commonly known as: MEVACOR Take 20 mg by mouth daily.   metoprolol succinate 100 MG 24 hr tablet Commonly known as: TOPROL-XL Take 1 tablet (100 mg total) by mouth daily. Take with or immediately following a meal.   montelukast 10 MG tablet Commonly known as: SINGULAIR Take 10 mg by mouth daily.   multivitamin with minerals Tabs tablet Take 1 tablet by mouth daily.   polyethylene glycol 17 g packet Commonly known as: MIRALAX / GLYCOLAX Take 17 g by mouth daily as needed for mild constipation.   terazosin 5 MG capsule Commonly known as: HYTRIN Take 5 mg by mouth daily.   thiamine 100 MG tablet Take 1 tablet (100 mg total) by mouth daily.        Discharge Exam: Filed Weights   08/19/20 0443 08/20/20 0500 08/21/20 0500  Weight: 86.9 kg 86.9 kg 87.2 kg   Vitals:   08/21/20 0943 08/21/20 1019  BP: 120/72 111/68  Pulse: 72 81  Resp:  20  Temp:  98.4 F (36.9 C)  SpO2:  96%   General: Appear in mild distress, no Rash; Oral Mucosa Clear, moist. no Abnormal Neck Mass Or lumps, Conjunctiva normal  Cardiovascular: S1 and S2 Present, no Murmur, Respiratory: good respiratory effort, Bilateral Air entry present and CTA, no Crackles, no wheezes Abdomen: Bowel Sound present, Soft and  no tenderness Extremities: no Pedal edema Neurology: alert and oriented to time, place, and person affect appropriate. no new focal deficit Gait not checked due to patient safety concerns   The results of significant diagnostics from this hospitalization (including imaging, microbiology, ancillary and laboratory) are listed below for reference.    Significant Diagnostic Studies: DG Chest 1 View  Result Date: 07/31/2020 CLINICAL DATA:  70 year old male status post right-sided thoracentesis. EXAM: CHEST  1 VIEW COMPARISON:  Chest radiograph dated 07/31/2020.  FINDINGS: Slight interval decrease in the size of the right pleural effusion. Trace right pleural effusion and right lung base atelectasis/infiltrate remains. No pneumothorax. The left lung is clear. Stable cardiomegaly. Atherosclerotic calcification of the aortic arch. No acute osseous pathology. IMPRESSION: Slight interval decrease in the size of the right pleural effusion. No pneumothorax. Electronically Signed   By: Elgie Collard M.D.   On: 07/31/2020 16:37   DG Chest 2 View  Result Date: 08/03/2020 CLINICAL DATA:  Dyspnea. EXAM: CHEST - 2 VIEW COMPARISON:  August 01, 2020. FINDINGS: Mild cardiomegaly is noted. No pneumothorax is noted. Old right rib fractures are noted. Left lung is clear. Mild right pleural effusion is noted with associated right basilar atelectasis or infiltrate. IMPRESSION: Mild right pleural effusion is noted with associated right basilar atelectasis or infiltrate. Electronically Signed   By: Lupita Raider M.D.   On: 08/03/2020 13:18   CT HEAD WO CONTRAST ( )  Result Date: 08/14/2020 CLINICAL DATA:  Delirium EXAM: CT HEAD WITHOUT CONTRAST TECHNIQUE: Contiguous axial images were obtained from the base of the skull through the vertex without intravenous contrast. COMPARISON:  08/11/2020 FINDINGS: Brain: Brain atrophy with some frontal predominance as seen previously. No sign of old or acute focal infarction, mass  lesion, hemorrhage, hydrocephalus or extra-axial collection. Vascular: There is atherosclerotic calcification of the major vessels at the base of the brain. Skull: Negative Sinuses/Orbits: Clear/normal Other: None IMPRESSION: No acute finding. No change since the previous study. Atrophy with some frontal predominance. Electronically Signed   By: Paulina Fusi M.D.   On: 08/14/2020 11:39   CT HEAD WO CONTRAST ( )  Result Date: 08/11/2020 CLINICAL DATA:  Altered mental status. Additional provided: Confusion, hearing voices. EXAM: CT HEAD WITHOUT CONTRAST TECHNIQUE: Contiguous axial images were obtained from the base of the skull through the vertex without intravenous contrast. COMPARISON:  Prior head CT examinations 04/18/2017 and earlier. FINDINGS: Brain: Frontal lobe predominant atrophy. There is no acute intracranial hemorrhage. No demarcated cortical infarct. No extra-axial fluid collection. No evidence of an intracranial mass. No midline shift. Vascular: No hyperdense vessel.  Atherosclerotic calcifications Skull: Normal. Negative for fracture or focal lesion. Sinuses/Orbits: Visualized orbits show no acute finding. Small volume frothy secretions within the right sphenoid sinus. Trace bilateral ethmoid sinus mucosal thickening. IMPRESSION: No evidence of acute intracranial abnormality. Frontal lobe predominant cerebral atrophy. Mild paranasal sinus disease at the imaged levels, as described. Electronically Signed   By: Jackey Loge DO   On: 08/11/2020 13:23   CT Angio Chest PE W and/or Wo Contrast  Result Date: 07/28/2020 CLINICAL DATA:  Shortness of breath for 3 days.  Elevated D-dimer. EXAM: CT ANGIOGRAPHY CHEST WITH CONTRAST TECHNIQUE: Multidetector CT imaging of the chest was performed using the standard protocol during bolus administration of intravenous contrast. Multiplanar CT image reconstructions and MIPs were obtained to evaluate the vascular anatomy. CONTRAST:  OMNIPAQUE IOHEXOL 350 MG/ML  SOLN COMPARISON:  CT chest, abdomen and pelvis 04/18/2017. Single-view of the chest today and PA and lateral chest 03/02/2017. FINDINGS: Cardiovascular: Satisfactory of opacification of the pulmonary arteries to the segmental level. No pulmonary embolus is seen. There is cardiomegaly. Small pericardial effusion is present. No aortic aneurysm. Calcific aortic and coronary atherosclerosis. Mediastinum/Nodes: No enlarged mediastinal, hilar, or axillary lymph nodes. Thyroid gland, trachea, and esophagus demonstrate no significant findings. Lungs/Pleura: Moderate right and very small left pleural effusions are seen. There is compressive atelectasis in the right lower lobe due to the patient's effusion. No consolidative process  is identified. No nodule or mass is seen. Upper Abdomen: The liver is low attenuating consistent with fatty infiltration. A simple cyst measuring 3.4 cm is identified. There is some thickening of the adrenal glands compatible with hyperplasia, unchanged. Left renal cysts are partially imaged. Musculoskeletal: No acute abnormality or focal lesion. Remote healed right rib fractures are noted. Review of the MIP images confirms the above findings. IMPRESSION: Negative for pulmonary embolus. Moderate right and small left pleural effusions with compressive atelectasis in the right lower lobe. Cause for the effusions is not identified. Cardiomegaly. Small pericardial effusion. Calcific coronary artery disease. Fatty infiltration of the liver. Aortic Atherosclerosis (ICD10-I70.0). Electronically Signed   By: Drusilla Kanner M.D.   On: 07/28/2020 13:37   MR BRAIN WO CONTRAST  Result Date: 08/16/2020 CLINICAL DATA:  Syncope only 4 days encephalopathy EXAM: MRI HEAD WITHOUT CONTRAST TECHNIQUE: Multiplanar, multiecho pulse sequences of the brain and surrounding structures were obtained without intravenous contrast. COMPARISON:  CT head 08/14/2020 FINDINGS: Brain: There is no evidence of acute intracranial  hemorrhage, extra-axial fluid collection, or infarct. There is no evidence of Werenicke encephalopathy. There is mild parenchymal volume loss. Scattered foci of FLAIR signal abnormality in the subcortical and periventricular white matter nonspecific but likely reflect sequela of chronic white matter microangiopathy. The ventricles are not enlarged. There is no midline shift. Vascular: Normal flow voids. Skull and upper cervical spine: Normal marrow signal. Sinuses/Orbits: The imaged paranasal sinuses are clear. The globes and orbits are unremarkable. Other: The mastoid air cells are clear. IMPRESSION: 1. No acute intracranial pathology; no evidence of Wernicke Encephalopthy. 2. Mild chronic white matter microangiopathy. Electronically Signed   By: Lesia Hausen MD   On: 08/16/2020 14:33   CT ABDOMEN PELVIS W CONTRAST  Result Date: 08/14/2020 CLINICAL DATA:  Epigastric pain, found down. EXAM: CT ABDOMEN AND PELVIS WITH CONTRAST TECHNIQUE: Multidetector CT imaging of the abdomen and pelvis was performed using the standard protocol following bolus administration of intravenous contrast. CONTRAST:  OMNIPAQUE IOHEXOL 300 MG/ML  SOLN COMPARISON:  CT chest 07/28/2020. FINDINGS: Lower chest: Partially loculated moderate right pleural effusion with compressive atelectasis in the right lower lobe. Hepatobiliary: Liver is decreased in attenuation diffusely. Low-attenuation lesions in the liver measure up to 5.2 cm in the right hepatic lobe, compatible with cysts. Small blushes of hyperattenuation in the periphery of the right hepatic lobe are too small to characterize. Liver and gallbladder are otherwise unremarkable. No biliary ductal dilatation. Pancreas: Negative. Spleen: Negative. Adrenals/Urinary Tract: Nodular thickening of both adrenal glands. Low-attenuation lesions in the kidneys measure up to 4.9 cm in the lower pole and are compatible with cysts. Subcentimeter low-attenuation lesions in the kidneys are too  small to definitively characterize. Ureters are decompressed. Bladder is unremarkable. Stomach/Bowel: Stomach, small bowel, appendix and colon are unremarkable. Vascular/Lymphatic: Atherosclerotic calcification of the aorta. No pathologically enlarged lymph nodes. Reproductive: Prostate is visualized. Other: Small bilateral inguinal hernias contain fat. No free fluid. Mesenteries and peritoneum are unremarkable. Musculoskeletal: Degenerative changes in the spine. 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 No worrisome lytic or sclerotic lesions. IMPRESSION: 1. No acute findings to explain the patient's epigastric pain. 2. Partially loculated moderate right pleural effusion, similar to decreased from 07/28/2020. 3. Hepatic steatosis. Blushes of hyperattenuation in the right hepatic lobe are too small to characterize. If further evaluation is desired, non emergent outpatient MR abdomen without and with contrast is recommended. 4.  Aortic atherosclerosis (ICD10-I70.0). Electronically Signed  By: Leanna Battles M.D.   On: 08/14/2020 14:33   NM Myocar Multi W/Spect Izetta Dakin Motion / EF  Result Date: 07/30/2020 CLINICAL DATA:  Atrial fibrillation. EXAM: MYOCARDIAL IMAGING WITH SPECT (REST AND PHARMACOLOGIC-STRESS) GATED LEFT VENTRICULAR WALL MOTION STUDY LEFT VENTRICULAR EJECTION FRACTION TECHNIQUE: Standard myocardial SPECT imaging was performed after resting intravenous injection of 10.7 mCi Tc-84m tetrofosmin. Subsequently, intravenous infusion of Lexiscan was performed under the supervision of the Cardiology staff. At peak effect of the drug, 31.0 mCi Tc-60m tetrofosmin was injected intravenously and standard myocardial SPECT imaging was performed. Quantitative gated imaging was also performed to evaluate left ventricular wall motion, and estimate left ventricular ejection fraction. COMPARISON:  None. FINDINGS: Perfusion: Fixed defect is seen involving inferior septal  myocardium toward the apex. No definite reversibility is noted. Wall Motion: Normal left ventricular wall motion. No left ventricular dilation. Left Ventricular Ejection Fraction: 65 % End diastolic volume 86 ml End systolic volume 30 ml IMPRESSION: 1. 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 No definite evidence of reversibility is noted to suggest acute ischemia. 2. Normal left ventricular wall motion. 3. Left ventricular ejection fraction 65% 4. Non invasive risk stratification*: Low *2012 Appropriate Use Criteria for Coronary Revascularization Focused Update: J Am Coll Cardiol. 2012;59(9):857-881. http://content.dementiazones.com.aspx?articleid=1201161 Electronically Signed   By: Lupita Raider M.D.   On: 07/30/2020 14:18   DG CHEST PORT 1 VIEW  Result Date: 08/20/2020 CLINICAL DATA:  Shortness of breath EXAM: PORTABLE CHEST 1 VIEW COMPARISON:  08/14/2020 FINDINGS: Cardiac shadow is enlarged but stable. Aortic calcifications are noted. Lungs are well aerated bilaterally. Small right-sided pleural effusion and adjacent atelectasis is again identified. Multiple old rib fractures are noted with healing on the right. No acute abnormality noted. IMPRESSION: Stable small effusion and right basilar atelectasis. Electronically Signed   By: Alcide Clever M.D.   On: 08/20/2020 17:13   DG Chest Portable 1 View  Result Date: 08/14/2020 CLINICAL DATA:  Altered mental status EXAM: PORTABLE CHEST 1 VIEW COMPARISON:  08/11/2020 FINDINGS: Persistent right pleural effusion and adjacent atelectasis. Stable cardiomediastinal contours. Chronic right rib fractures. IMPRESSION: Persistent right pleural effusion and adjacent atelectasis. Electronically Signed   By: Guadlupe Spanish M.D.   On: 08/14/2020 10:58   DG Chest Portable 1 View  Result Date: 08/11/2020 CLINICAL DATA:  Altered mental status. Additional history provided: Confusion, hearing voices. EXAM:  PORTABLE CHEST 1 VIEW COMPARISON:  Prior chest radiographs 08/03/2020 and earlier. CT angiogram chest 07/28/2020. FINDINGS: Unchanged cardiomegaly. Aortic atherosclerosis. Persistent right pleural effusion with associated right basilar atelectasis and/or consolidation. Mild atelectasis within the left lung base. No evidence of pneumothorax. Redemonstrated chronic right-sided rib fracture deformities. IMPRESSION: Persistent right pleural effusion with associated right basilar atelectasis and/or consolidation. Mild atelectasis within the left lung base, new from the prior exam of 08/03/2020. Unchanged cardiomegaly. Aortic Atherosclerosis (ICD10-I70.0). Electronically Signed   By: Jackey Loge DO   On: 08/11/2020 13:26   DG CHEST PORT 1 VIEW  Result Date: 08/01/2020 CLINICAL DATA:  Shortness of breath.  Prior thoracentesis. EXAM: PORTABLE CHEST 1 VIEW COMPARISON:  07/31/2020. FINDINGS: Mediastinum hilar structures normal. Cardiomegaly with pulmonary venous congestion again noted. Low lung volumes with persistent bibasilar atelectasis. Small right pleural effusion, decreased in size after prior right thoracentesis. No pneumothorax. Old right rib fractures. IMPRESSION: 1. Cardiomegaly with pulmonary venous congestion again noted. Low lung volumes with bibasilar atelectasis again noted. 2. Small right pleural effusion, decreased in size after prior thoracentesis.  No pneumothorax. Electronically Signed   By: Maisie Fus  Register   On: 08/01/2020 08:03   DG CHEST PORT 1 VIEW  Result Date: 07/31/2020 CLINICAL DATA:  Shortness of breath, right-sided chest pain, status post thoracentesis today EXAM: PORTABLE CHEST 1 VIEW COMPARISON:  07/31/2020 FINDINGS: Unchanged AP portable chest radiograph. Cardiomegaly. Layering right pleural effusion with associated atelectasis or consolidation. No pneumothorax. No new airspace opacity. Redemonstrated chronic fracture deformities of the right ribs. IMPRESSION: 1.  Unchanged AP  portable chest radiograph. 2. Layering right pleural effusion with associated atelectasis or consolidation. 3.  No pneumothorax. No new airspace opacity. 4.  Cardiomegaly. Electronically Signed   By: Lauralyn Primes M.D.   On: 07/31/2020 20:35   DG CHEST PORT 1 VIEW  Result Date: 07/31/2020 CLINICAL DATA:  Hypoxia and shortness breath EXAM: PORTABLE CHEST 1 VIEW COMPARISON:  CT chest 07/28/2020 FINDINGS: Hazy obscuration of the right lung base compatible with known right pleural effusion and right lower lobe atelectasis, likely with a component of right middle lobe atelectasis as shown on CT from 07/28/2020. Moderate cardiomegaly is present. Atherosclerotic calcification of the aortic arch. Old healed right rib fractures. The left lung remains clear. IMPRESSION: 1. Similar appearance of right pleural effusion and opacification of the right lung base due to atelectasis of the right lower lobe and part of the right middle lobe. 2. Moderate cardiomegaly. 3.  Aortic Atherosclerosis (ICD10-I70.0). Electronically Signed   By: Gaylyn Rong M.D.   On: 07/31/2020 13:28   DG Chest Portable 1 View  Result Date: 07/28/2020 CLINICAL DATA:  cough, chills, hypoxia, shortness of breath. new afib EXAM: PORTABLE CHEST 1 VIEW COMPARISON:  04/09/2017 FINDINGS: Mild cardiomegaly. Cardiac silhouette has enlarged compared to the previous study. Atherosclerotic calcification of the aortic knob. Moderate-sized right-sided pleural effusion with hazy right basilar airspace opacity. No left-sided pleural effusion is seen. No pneumothorax. Multiple chronic right-sided rib fractures. IMPRESSION: 1. Moderate right-sided pleural effusion with hazy right basilar airspace opacity, which may reflect atelectasis versus pneumonia. 2. Cardiac silhouette has enlarged compared to the previous study. Underlying pericardial effusion not excluded. Electronically Signed   By: Duanne Guess D.O.   On: 07/28/2020 11:17   EEG adult  Result  Date: 08/15/2020 Charlsie Quest, MD     08/15/2020  5:21 PM Patient Name: Samuel Barnes MRN: [id number] Epilepsy Attending: Charlsie Quest Referring Physician/Provider: Dr Milon Dikes Date: 08/15/2020 Duration: 23.48 mins Patient history: 70yo M with  4-5 days of progressive acute encephalopathy and a witnessed 30 minute episode of syncope and unresponsiveness at home. EEG to evaluate for seizure Level of alertness: Awake AEDs during EEG study: LEV Technical aspects: This EEG study was done with scalp electrodes positioned according to the 10-20 International system of electrode placement. Electrical activity was acquired at a sampling rate of  and reviewed with a high frequency filter of  and a low frequency filter of . EEG data were recorded continuously and digitally stored. Description: The posterior dominant rhythm consists of 9-10 Hz activity of moderate voltage (25-35 uV) seen predominantly in posterior head regions, symmetric and reactive to eye opening and eye closing. Hyperventilation and photic stimulation were not performed.   IMPRESSION: This study is within normal limits. No seizures or epileptiform discharges were seen throughout the recording. Priyanka Annabelle Harman   Overnight EEG with video  Result Date: 08/16/2020 Charlsie Quest, MD     08/16/2020 10:39 AM Patient Name: Samuel Barnes MRN: [id number] Epilepsy Attending: Charlsie Quest  Referring Physician/Provider: Dr Milon Dikes Duration: 08/15/2020 1745 to 08/16/2020 1006  Patient history: 70yo M with  4-5 days of progressive acute encephalopathy and a witnessed 30 minute episode of syncope and unresponsiveness at home. EEG to evaluate for seizure  Level of alertness: Awake, asleep  AEDs during EEG study: LEV  Technical aspects: This EEG study was done with scalp electrodes positioned according to the 10-20 International system of electrode placement. Electrical activity was acquired at a sampling rate of 500Hz  and  reviewed with a high frequency filter of 70Hz  and a low frequency filter of 1Hz . EEG data were recorded continuously and digitally stored.  Description: The posterior dominant rhythm consists of 9-10 Hz activity of moderate voltage (25-35 uV) seen predominantly in posterior head regions, symmetric and reactive to eye opening and eye closing.  Sleep was characterized by vertex waves, sleep spindles (12 to 14 Hz), maximal frontocentral region. Hyperventilation and photic stimulation were not performed.    IMPRESSION: This study is within normal limits. No seizures or epileptiform discharges were seen throughout the recording.    ECHOCARDIOGRAM COMPLETE  Result Date: 07/30/2020    ECHOCARDIOGRAM REPORT   Patient Name:   Samuel Barnes Date of Exam: 07/29/2020 Medical Rec #:  08/01/2020             Height:       68.0 in Accession #:    [id number]            Weight:       231.3 lb Date of Birth:  January 06, 1951             BSA:          2.174 m Patient Age:    70 years              BP:           139/80 mmHg Patient Gender: M                     HR:           68 bpm. Exam Location:  Inpatient Procedure: 2D Echo, Cardiac Doppler, Color Doppler and Intracardiac            Opacification Agent Indications:    CHF                 Atrial fibrillation  History:        Patient has no prior history of Echocardiogram examinations.                 CAD; Risk Factors:Dyslipidemia, Hypertension and Current Smoker.                 ETOH use.  Sonographer:    [id number] RDCS (AE) Referring Phys: [id number] DAVID 05/04/1950 ORTIZ  Sonographer Comments: Patient is morbidly obese and Technically difficult study due to poor echo windows. Image acquisition challenging due to patient body habitus. IMPRESSIONS  1. Left ventricular ejection fraction, by estimation, is 55 to 60%. The left ventricle has normal function. The left ventricle has no regional wall motion abnormalities. Left ventricular diastolic function could not be  evaluated.  2. Prominent moderator band of no clinical significance. Right ventricular systolic function is mildly reduced. The right ventricular size is normal. There is mildly elevated pulmonary artery systolic pressure. The estimated right ventricular systolic pressure is 36.5 mmHg.  3. The mitral valve is normal in structure. No evidence of mitral valve regurgitation.  No evidence of mitral stenosis.  4. The aortic valve is normal in structure. Aortic valve regurgitation is not visualized. No aortic stenosis is present.  5. The inferior vena cava is dilated in size with <50% respiratory variability, suggesting right atrial pressure of 15 mmHg. FINDINGS  Left Ventricle: Left ventricular ejection fraction, by estimation, is 55 to 60%. The left ventricle has normal function. The left ventricle has no regional wall motion abnormalities. Definity contrast agent was given IV to delineate the left ventricular  endocardial borders. The left ventricular internal cavity size was normal in size. There is no left ventricular hypertrophy. Left ventricular diastolic function could not be evaluated due to atrial fibrillation. Left ventricular diastolic function could  not be evaluated. Normal left ventricular filling pressure. Right Ventricle: Prominent moderator band of no clinical significance. The right ventricular size is normal. No increase in right ventricular wall thickness. Right ventricular systolic function is mildly reduced. There is mildly elevated pulmonary artery  systolic pressure. The tricuspid regurgitant velocity is 2.32 m/s, and with an assumed right atrial pressure of 15 mmHg, the estimated right ventricular systolic pressure is 36.5 mmHg. Left Atrium: Left atrial size was normal in size. Right Atrium: Right atrial size was normal in size. Pericardium: There is no evidence of pericardial effusion. Mitral Valve: The mitral valve is normal in structure. No evidence of mitral valve regurgitation. No evidence of  mitral valve stenosis. Tricuspid Valve: The tricuspid valve is normal in structure. Tricuspid valve regurgitation is not demonstrated. No evidence of tricuspid stenosis. Aortic Valve: The aortic valve is normal in structure. Aortic valve regurgitation is not visualized. No aortic stenosis is present. Aortic valve mean gradient measures 2.0 mmHg. Aortic valve peak gradient measures 3.7 mmHg. Aortic valve area, by VTI measures 2.22 cm. Pulmonic Valve: The pulmonic valve was normal in structure. Pulmonic valve regurgitation is not visualized. No evidence of pulmonic stenosis. Aorta: The aortic root is normal in size and structure. Venous: The inferior vena cava is dilated in size with less than 50% respiratory variability, suggesting right atrial pressure of 15 mmHg. IAS/Shunts: No atrial level shunt detected by color flow Doppler.  LEFT VENTRICLE PLAX 2D LVIDd:         4.40 cm  Diastology LVIDs:         4.00 cm  LV e' medial:    11.40 cm/s LV PW:         1.20 cm  LV E/e' medial:  8.2 LV IVS:        0.80 cm  LV e' lateral:   11.30 cm/s LVOT diam:     2.00 cm  LV E/e' lateral: 8.3 LV SV:         45 LV SV Index:   21 LVOT Area:     3.14 cm  RIGHT VENTRICLE          IVC RV Basal diam:  3.40 cm  IVC diam: 3.20 cm LEFT ATRIUM             Index       RIGHT ATRIUM           Index LA diam:        5.30 cm 2.44 cm/m  RA Area:     24.00 cm LA Vol (A2C):   89.3 ml 41.08 ml/m RA Volume:   68.80 ml  31.65 ml/m LA Vol (A4C):   63.1 ml 29.03 ml/m LA Biplane Vol: 77.9 ml 35.83 ml/m  AORTIC VALVE AV Area (Vmax):  2.13 cm AV Area (Vmean):   2.22 cm AV Area (VTI):     2.22 cm AV Vmax:           96.40 cm/s AV Vmean:          62.000 cm/s AV VTI:            0.202 m AV Peak Grad:      3.7 mmHg AV Mean Grad:      2.0 mmHg LVOT Vmax:         65.50 cm/s LVOT Vmean:        43.900 cm/s LVOT VTI:          0.143 m LVOT/AV VTI ratio: 0.71  AORTA Ao Root diam: 3.30 cm MITRAL VALVE               TRICUSPID VALVE MV Area (PHT): 3.53 cm     TR Peak grad:   21.5 mmHg MV Decel Time: 215 msec    TR Vmax:        232.00 cm/s MV E velocity: 93.80 cm/s                            SHUNTS                            Systemic VTI:  0.14 m                            Systemic Diam: 2.00 cm Armanda Magic MD Electronically signed by Armanda Magic MD Signature Date/Time: 07/30/2020/9:43:47 AM    Final    ECHO TEE  Result Date: 08/02/2020    TRANSESOPHOGEAL ECHO REPORT   Patient Name:   Samuel Barnes Date of Exam: 08/02/2020 Medical Rec #:  [id number]             Height:       68.0 in Accession #:    [id number]            Weight:       215.2 lb Date of Birth:  1950-05-13             BSA:          2.108 m Patient Age:    70 years              BP:           144/77 mmHg Patient Gender: M                     HR:           48 bpm. Exam Location:  Inpatient Procedure: Transesophageal Echo, 3D Echo, Cardiac Doppler and Color Doppler Indications:     I48.1 Persistent atrial fibrillation  History:         Patient has prior history of Echocardiogram examinations, most                  recent 07/30/2020. Risk Factors:Hypertension, Dyslipidemia and                  Current Smoker. Acute hypoxic respiratory failure likely 2/2                  acute CHF. ETOH Abuse.  Sonographer:     Leta Jungling RDCS Referring Phys:  [id number] Beatriz Stallion Diagnosing Phys: Herbert Seta  Pemberton MD PROCEDURE: After discussion of the risks and benefits of a TEE, an informed consent was obtained from the patient. The transesophogeal probe was passed without difficulty through the esophogus of the patient. Imaged were obtained with the patient in a left lateral decubitus position. Sedation performed by different physician. The patient was monitored while under deep sedation. Anesthestetic sedation was provided intravenously by Anesthesiology: 225.92mg  of Propofol, 30mg  of Lidocaine. Image quality was good. The patient's vital signs; including heart rate, blood pressure, and oxygen saturation;  remained stable throughout the procedure. The patient developed no complications during the procedure. IMPRESSIONS  1. Left ventricular ejection fraction, by estimation, is 55 to 60%. The left ventricle has normal function.  2. Right ventricular systolic function is normal. The right ventricular size is normal.  3. Left atrial size was mild to moderately dilated. No left atrial/left atrial appendage thrombus was detected.  4. Right atrial size was moderately dilated.  5. The mitral valve is normal in structure. Mild mitral valve regurgitation. No evidence of mitral stenosis.  6. The aortic valve is tricuspid. There is mild calcification of the aortic valve. There is mild thickening of the aortic valve. Aortic valve regurgitation is not visualized. Mild aortic valve sclerosis is present, with no evidence of aortic valve stenosis.  7. There is Moderate (Grade III) plaque.  8. Successful DCCV performed (200J x1) after TEE with return to NSR. Please see separate note. FINDINGS  Left Ventricle: Left ventricular ejection fraction, by estimation, is 55 to 60%. The left ventricle has normal function. The left ventricular internal cavity size was normal in size. Right Ventricle: The right ventricular size is normal. No increase in right ventricular wall thickness. Right ventricular systolic function is normal. Left Atrium: Left atrial size was mild to moderately dilated. No left atrial/left atrial appendage thrombus was detected. Right Atrium: Right atrial size was moderately dilated. Pericardium: There is no evidence of pericardial effusion. Mitral Valve: The mitral valve is normal in structure. There is mild thickening of the mitral valve leaflet(s). There is mild calcification of the mitral valve leaflet(s). Mild mitral valve regurgitation. No evidence of mitral valve stenosis. Tricuspid Valve: The tricuspid valve is normal in structure. Tricuspid valve regurgitation is mild. Aortic Valve: The aortic valve is tricuspid.  There is mild calcification of the aortic valve. There is mild thickening of the aortic valve. Aortic valve regurgitation is not visualized. Mild aortic valve sclerosis is present, with no evidence of aortic valve stenosis. Pulmonic Valve: The pulmonic valve was normal in structure. Pulmonic valve regurgitation is trivial. Aorta: The aortic root and ascending aorta are structurally normal, with no evidence of dilitation. There is moderate (Grade III) plaque. IAS/Shunts: No atrial level shunt detected by color flow Doppler.  LEFT VENTRICLE PLAX 2D LVOT diam:     2.00 cm LVOT Area:     3.14 cm   AORTA Ao Root diam: 3.20 cm Ao Asc diam:  3.50 cm TRICUSPID VALVE TR Peak grad:   16.8 mmHg TR Vmax:        205.00 cm/s  SHUNTS Systemic Diam: 2.00 cm Laurance Flatten MD Electronically signed by Laurance Flatten MD Signature Date/Time: 08/02/2020/4:16:15 PM    Final    VAS US CAROTID  Result Date: 08/14/2020 Carotid Arterial Duplex Study Patient Name:  Samuel Barnes  Date of Exam:   08/14/2020 Medical Rec #: [id number]              Accession #:    [id number] Date of  Birth: October 22, 1950              Patient Gender: M Patient Age:   45 years Exam Location:  [facility] Procedure:      VAS US CAROTID Referring Phys: Lynwood Dawley --------------------------------------------------------------------------------  Indications:       Syncope. Risk Factors:      Hypertension, hyperlipidemia. Comparison Study:  no prior Performing Technologist: [place] Ponder RVS  Examination Guidelines: A complete evaluation includes B-mode imaging, spectral Doppler, color Doppler, and power Doppler as needed of all accessible portions of each vessel. Bilateral testing is considered an integral part of a complete examination. Limited examinations for reoccurring indications may be performed as noted.  Right Carotid Findings: +----------+--------+--------+--------+------------------+--------+           PSV cm/sEDV cm/sStenosisPlaque  DescriptionComments +----------+--------+--------+--------+------------------+--------+ CCA Prox  65      10              heterogenous               +----------+--------+--------+--------+------------------+--------+ CCA Distal88      14              heterogenous               +----------+--------+--------+--------+------------------+--------+ ICA Prox  64      17      1-39%   heterogenous               +----------+--------+--------+--------+------------------+--------+ ICA Distal58      19                                         +----------+--------+--------+--------+------------------+--------+ ECA       44                                                 +----------+--------+--------+--------+------------------+--------+ +----------+--------+-------+--------+-------------------+           PSV cm/sEDV cmsDescribeArm Pressure (mmHG) +----------+--------+-------+--------+-------------------+ [id number]                                         +----------+--------+-------+--------+-------------------+ +---------+--------+--+--------+--+---------+ VertebralPSV cm/s51EDV cm/s14Antegrade +---------+--------+--+--------+--+---------+  Left Carotid Findings: +----------+--------+--------+--------+------------------+--------+           PSV cm/sEDV cm/sStenosisPlaque DescriptionComments +----------+--------+--------+--------+------------------+--------+ CCA Prox  57      14              heterogenous               +----------+--------+--------+--------+------------------+--------+ CCA Distal67      21              heterogenous               +----------+--------+--------+--------+------------------+--------+ ICA Prox  54      23      1-39%   heterogenous               +----------+--------+--------+--------+------------------+--------+ ICA Distal65      27                                          +----------+--------+--------+--------+------------------+--------+ ECA  66                                                 +----------+--------+--------+--------+------------------+--------+ +----------+--------+--------+--------+-------------------+           PSV cm/sEDV cm/sDescribeArm Pressure (mmHG) +----------+--------+--------+--------+-------------------+ [id number]                                          +----------+--------+--------+--------+-------------------+ +---------+--------+--+--------+--+---------+ VertebralPSV cm/s29EDV cm/s12Antegrade +---------+--------+--+--------+--+---------+   Summary: Right Carotid: Velocities in the right ICA are consistent with a 1-39% stenosis. Left Carotid: Velocities in the left ICA are consistent with a 1-39% stenosis. Vertebrals: Bilateral vertebral arteries demonstrate antegrade flow. *See table(s) above for measurements and observations.  Electronically signed by Sherald Hess MD on 08/14/2020 at 4:29:06 PM.    Final    IR THORACENTESIS ASP PLEURAL SPACE W/IMG GUIDE  Result Date: 07/31/2020 INDICATION: Patient presents today with shortness of breath and right pleural effusion. Interventional radiology asked to perform a diagnostic and therapeutic thoracentesis. EXAM: ULTRASOUND GUIDED THORACENTESIS MEDICATIONS: 1% lidocaine 10 mL COMPLICATIONS: None immediate. PROCEDURE: An ultrasound guided thoracentesis was thoroughly discussed with the patient and questions answered. The benefits, risks, alternatives and complications were also discussed. The patient understands and wishes to proceed with the procedure. Written consent was obtained. Ultrasound was performed to localize and mark an adequate pocket of fluid in the right chest. The area was then prepped and draped in the normal sterile fashion. 1% Lidocaine was used for local anesthesia. Under ultrasound guidance a 6 Fr Safe-T-Centesis catheter was introduced. Thoracentesis  was performed. The catheter was removed and a dressing applied. FINDINGS: A total of approximately 1.5 L of amber-colored fluid was removed. Samples were sent to the laboratory as requested by the clinical team. IMPRESSION: Successful ultrasound guided right thoracentesis yielding 1.5 L of pleural fluid. Read by: Alwyn Ren, NP Electronically Signed   By: Marliss Coots MD   On: 07/31/2020 16:35    Microbiology: Recent Results (from the past 240 hour(s))  Resp Panel by RT-PCR (Flu A&B, Covid) Nasopharyngeal Swab     Status: None   Collection Time: 08/14/20 10:25 AM   Specimen: Nasopharyngeal Swab; Nasopharyngeal(NP) swabs in vial transport medium  Result Value Ref Range Status   SARS Coronavirus 2 by RT PCR NEGATIVE NEGATIVE Final    Comment: (NOTE) SARS-CoV-2 target nucleic acids are NOT DETECTED.  The SARS-CoV-2 RNA is generally detectable in upper respiratory specimens during the acute phase of infection. The lowest concentration of SARS-CoV-2 viral copies this assay can detect is 138 copies/mL. A negative result does not preclude SARS-Cov-2 infection and should not be used as the sole basis for treatment or other patient management decisions. A negative result may occur with  improper specimen collection/handling, submission of specimen other than nasopharyngeal swab, presence of viral mutation(s) within the areas targeted by this assay, and inadequate number of viral copies(<138 copies/mL). A negative result must be combined with clinical observations, patient history, and epidemiological information. The expected result is Negative.  Fact Sheet for Patients:  BloggerCourse.com  Fact Sheet for Healthcare Providers:  SeriousBroker.it  This test is no t yet approved or cleared by the [place] FDA and  has been authorized for detection and/or diagnosis of SARS-CoV-2 by FDA under an Emergency  Use Authorization (EUA). This EUA  will remain  in effect (meaning this test can be used) for the duration of the COVID-19 declaration under Section 564(b)(1) of the Act, 21 U.S.C.section 360bbb-3(b)(1), unless the authorization is terminated  or revoked sooner.       Influenza A by PCR NEGATIVE NEGATIVE Final   Influenza B by PCR NEGATIVE NEGATIVE Final    Comment: (NOTE) The Xpert Xpress SARS-CoV-2/FLU/RSV plus assay is intended as an aid in the diagnosis of influenza from Nasopharyngeal swab specimens and should not be used as a sole basis for treatment. Nasal washings and aspirates are unacceptable for Xpert Xpress SARS-CoV-2/FLU/RSV testing.  Fact Sheet for Patients: BloggerCourse.com  Fact Sheet for Healthcare Providers: SeriousBroker.it  This test is not yet approved or cleared by the [place] FDA and has been authorized for detection and/or diagnosis of SARS-CoV-2 by FDA under an Emergency Use Authorization (EUA). This EUA will remain in effect (meaning this test can be used) for the duration of the COVID-19 declaration under Section 564(b)(1) of the Act, 21 U.S.C. section 360bbb-3(b)(1), unless the authorization is terminated or revoked.  Performed at [facility], [address], [place], [place] [address]     Labs: CBC: Recent Labs  Lab 08/21/20 0400  WBC 10.9*  HGB 15.2  HCT 45.6  MCV 97.0  PLT 266   Basic Metabolic Panel: Recent Labs  Lab 08/18/20 0104 08/21/20 0400  NA 132* 131*  K 4.2 4.7  CL 101 101  CO2 24 21*  GLUCOSE 87 100*  BUN 18 14  CREATININE 0.83 0.65  CALCIUM 8.8* 8.7*   Liver Function Tests: No results for input(s): AST, ALT, ALKPHOS, BILITOT, PROT, ALBUMIN in the last 168 hours. CBG: No results for input(s): GLUCAP in the last 168 hours.  Time spent: 35 minutes  Signed:  Lynden Oxford  [facility] 08/21/2020

## 2020-08-23 ENCOUNTER — Other Ambulatory Visit: Payer: Self-pay

## 2020-08-23 ENCOUNTER — Encounter: Payer: Self-pay | Admitting: Student

## 2020-08-23 ENCOUNTER — Ambulatory Visit: Payer: Medicare Other | Admitting: Student

## 2020-08-23 VITALS — BP 100/62 | HR 108 | Ht 69.0 in | Wt 193.2 lb

## 2020-08-23 DIAGNOSIS — F17200 Nicotine dependence, unspecified, uncomplicated: Secondary | ICD-10-CM | POA: Diagnosis not present

## 2020-08-23 DIAGNOSIS — R0609 Other forms of dyspnea: Secondary | ICD-10-CM

## 2020-08-23 DIAGNOSIS — J9611 Chronic respiratory failure with hypoxia: Secondary | ICD-10-CM

## 2020-08-23 DIAGNOSIS — J449 Chronic obstructive pulmonary disease, unspecified: Secondary | ICD-10-CM

## 2020-08-23 DIAGNOSIS — J90 Pleural effusion, not elsewhere classified: Secondary | ICD-10-CM | POA: Diagnosis not present

## 2020-08-23 DIAGNOSIS — R0600 Dyspnea, unspecified: Secondary | ICD-10-CM | POA: Diagnosis not present

## 2020-08-23 MED ORDER — ALBUTEROL SULFATE (2.5 MG/3ML) 0.083% IN NEBU: 2.5000 mg | INHALATION_SOLUTION | Freq: Four times a day (QID) | RESPIRATORY_TRACT | 12 refills | Status: DC | PRN

## 2020-08-23 MED ORDER — TIOTROPIUM BROMIDE MONOHYDRATE 18 MCG IN CAPS: 18.0000 ug | ORAL_CAPSULE | Freq: Every day | RESPIRATORY_TRACT | 11 refills | Status: DC

## 2020-08-23 NOTE — Patient Instructions (Addendum)
-   Congratulations on stopping smoking, I placed a referral for you today to be seen in our Lung Cancer Screening clinic - STOP using dulera - START using spiriva 1 puff once daily EVERYDAY (one quick deep breath in through the inhaler and hold it for 5 seconds before breathing out) - can use albuterol inhaler or nebulizer if easier for you - ideally you are only using this when you need it, which would hopefully only be 1-2 times per week.  - We will schedule breathing tests to confirm whether or not you have COPD - See you in 6 months or sooner - call if you're feeling worse and we can either discuss over phone or see you sooner if we need to

## 2020-09-18 ENCOUNTER — Other Ambulatory Visit (HOSPITAL_COMMUNITY): Payer: Self-pay

## 2020-09-20 ENCOUNTER — Telehealth: Payer: Self-pay | Admitting: Cardiology

## 2020-09-20 NOTE — Telephone Encounter (Signed)
Spoke with the patient and he is not taking Lasix or losartan. Advised him on recommendations per Gillian Shields, NP. Patient verbalized understanding.

## 2020-09-20 NOTE — Telephone Encounter (Signed)
Reviewed hospital record. Lasix and Losartan were both discontinued, please ensure he is not taking at home. Low suspicion lightheadedness caused by Eliquis and strongly recommend he continue to prevent stroke due to his known atrial fibrillation. If he will bring log of BP and HR to clinic will help [place] to better assess. In interim, recommend staying well hydrated, making position changes slowly, and wearing compression socks to prevent lightheadedness.   I may have an appt opening tomorrow if he would like to be seen sooner.   Alver Sorrow, NP

## 2020-09-20 NOTE — Telephone Encounter (Signed)
Spoke with the patient who states that he has been having some lightheadedness. He is not sure that he can tolerate the Eliquis. He denies any signs/symptoms of bleeding. He denies any chest pain. Reports shortness of breath only with exertion. He reports he just gets a bit dizzy but does not feel like he is going to pass out. He has not been able to sleep well and reports feeling fatigued and washed out. He has a Engineer, civil (consulting) and therapy coming out to his home. Reports his vital signs have been good. He is concerned about new medications that he was started on during his hospitalizations.  Patient is scheduled to see Gillian Shields, NP on 9/19. Advised that I will let her know of his concerns and he can discuss with her at the appointment, but will call back if any further recommendations in the meantime. He will let us know if symptoms worsen before then.

## 2020-09-20 NOTE — Telephone Encounter (Signed)
   Pt c/o medication issue:  1. Name of Medication:  apixaban (ELIQUIS) 5 MG TABS tablet amiodarone (PACERONE) 200 MG tablet  2. How are you currently taking this medication (dosage and times per day)?   3. Are you having a reaction (difficulty breathing--STAT)?   4. What is your medication issue? Pt is having reaction with these meds, he said he's been feeling lightheaded and feels like going to passed out

## 2020-09-25 ENCOUNTER — Other Ambulatory Visit: Payer: Self-pay | Admitting: Family

## 2020-09-25 ENCOUNTER — Ambulatory Visit (HOSPITAL_BASED_OUTPATIENT_CLINIC_OR_DEPARTMENT_OTHER): Payer: Medicare Other | Admitting: Family

## 2020-09-25 ENCOUNTER — Encounter (HOSPITAL_BASED_OUTPATIENT_CLINIC_OR_DEPARTMENT_OTHER): Payer: Self-pay | Admitting: Family

## 2020-09-25 ENCOUNTER — Other Ambulatory Visit: Payer: Self-pay

## 2020-09-25 VITALS — BP 118/70 | HR 61 | Ht 69.0 in | Wt 192.6 lb

## 2020-09-25 DIAGNOSIS — I480 Paroxysmal atrial fibrillation: Secondary | ICD-10-CM | POA: Diagnosis not present

## 2020-09-25 DIAGNOSIS — I10 Essential (primary) hypertension: Secondary | ICD-10-CM

## 2020-09-25 DIAGNOSIS — E785 Hyperlipidemia, unspecified: Secondary | ICD-10-CM | POA: Diagnosis not present

## 2020-09-25 DIAGNOSIS — I2510 Atherosclerotic heart disease of native coronary artery without angina pectoris: Secondary | ICD-10-CM

## 2020-09-25 DIAGNOSIS — Z7901 Long term (current) use of anticoagulants: Secondary | ICD-10-CM

## 2020-09-25 DIAGNOSIS — I5032 Chronic diastolic (congestive) heart failure: Secondary | ICD-10-CM

## 2020-09-25 MED ORDER — METOPROLOL SUCCINATE ER 25 MG PO TB24: 25.0000 mg | ORAL_TABLET | Freq: Every day | ORAL | 0 refills | Status: DC

## 2020-09-25 MED ORDER — METOPROLOL SUCCINATE ER 100 MG PO TB24: 50.0000 mg | ORAL_TABLET | Freq: Every day | ORAL | 1 refills | Status: DC

## 2020-09-25 NOTE — Progress Notes (Signed)
Office Visit    Patient Name: Samuel Barnes Date of Encounter: 09/25/2020  PCP:  [name], PA   [facility]  Cardiologist:  Armanda Magic, MD  Advanced Practice Provider:  No care team member to display Electrophysiologist:  None    Chief Complaint    Samuel Barnes is a 70 y.o. male with a hx of CHF, chronic hypoxic respiratory failure on 3L O2, atrial fibrillation on Eliquis, BPH, CAD, HLD, tobacco use, obesity, depression, alcohol abuse presents today for hospital follow up  Past Medical History    Past Medical History:  Diagnosis Date   Aortic atherosclerosis (HCC) 07/28/2020   BPH (benign prostatic hyperplasia)    Class 2 obesity 07/28/2020   Coronary artery calcification 07/28/2020   Depression    Family history of adverse reaction to anesthesia    Hyperlipidemia 07/28/2020   Hypertension    Tobacco use 07/28/2020   Past Surgical History:  Procedure Laterality Date   CARDIOVERSION N/A 08/02/2020   Procedure: CARDIOVERSION;  Surgeon: Meriam Sprague, MD;  Location: [facility] ENDOSCOPY;  Service: Cardiovascular;  Laterality: N/A;   IR THORACENTESIS ASP PLEURAL SPACE W/IMG GUIDE  07/31/2020   TEE WITHOUT CARDIOVERSION N/A 08/02/2020   Procedure: TRANSESOPHAGEAL ECHOCARDIOGRAM (TEE);  Surgeon: Meriam Sprague, MD;  Location: [facility] ENDOSCOPY;  Service: Cardiovascular;  Laterality: N/A;   TRANSURETHRAL RESECTION OF PROSTATE      Allergies  Allergies  Allergen Reactions   Ciprofloxacin Shortness Of Breath and Other (See Comments)    Numbness in extremities    Gabapentin Other (See Comments)    Mental problems, depression, anger    History of Present Illness    Samuel Barnes is a 70 y.o. male with a hx of CHF, chronic hypoxic respiratory failure on 3L O2, atrial fibrillation on Eliquis, BPH, CAD, HLD, tobacco use, obesity, depression, alcohol abuse last seen while hospitalized.  Admitted 07/28/2020 - 08/08/2020 with  acute on chronic diastolic heart failure, paroxysmal new onset atrial fibrillation, and pleural effusion.  No prior cardiac imaging or ischemic testing coronary artery calcifications on previous CT scan from 2019.  Underwent right thoracentesis centesis.TEE 08/02/20 LVEF 55-60%, no left atrial appendage thrombus. DCCV subsequently performed. Only brief restoration of NSR. He was diuresed with IV Lasix 9 litersand discharged on p.o. Lasix.  He was admitted 08/14/20-08/21/20 with Wernicke's encephalopathy, syncope, metabolic encephalopathy. CT/MRI brain unremarkable. Family noted cognitive decline for past few weeks.  08/30/20 labs via Care Everywhere: NA 133, AST 38, ALT 78, alkaline phosphatase 158, creatinine 0.69, GFR greater than 90 Hemoglobin 15.4, hematocrit 46.7, platelets 314 B12 486, PSA 2.3, Hemoglobin A1c 5.9  He presents today for follow-up with his daughter.  Tells me he is not sleeping well. Only sleeping 3-4 hours before waking. He wakes up to use restroom then has trouble falling back to sleep. Encouraged to discuss with primary care provider. Reports some chest tightness after discharge at rest that is fleeting and self resolves. Has been mostly sedentary watching television and walking around the house. He is working with physical therapy at home with improvement in exercise tolerance. No smoking nor drinking since hospital discharge. Reports lightheadedness with sensation of things feeling "wavy". Has had near syncope but not syncope. BP at home has been routinely >150 systolic when checked by arm cuff.  EKGs/Labs/Other Studies Reviewed:   The following studies were reviewed today:   ECHO:  TEE 08/02/20   1. Left ventricular ejection fraction, by estimation, is  55 to 60%. The  left ventricle has normal function.   2. Right ventricular systolic function is normal. The right ventricular  size is normal.   3. Left atrial size was mild to moderately dilated. No left atrial/left  atrial  appendage thrombus was detected.   4. Right atrial size was moderately dilated.   5. The mitral valve is normal in structure. Mild mitral valve  regurgitation. No evidence of mitral stenosis.   6. The aortic valve is tricuspid. There is mild calcification of the  aortic valve. There is mild thickening of the aortic valve. Aortic valve  regurgitation is not visualized. Mild aortic valve sclerosis is present,  with no evidence of aortic valve  stenosis.   7. There is Moderate (Grade III) plaque.   8. Successful DCCV performed (200J x1) after TEE with return to NSR.  Please see separate note.   EKG:  EKG is ordered today.  The ekg ordered today demonstrates rate controlled atrial fibrillation rate 61 bpm with no acute St/T wave changes.   Recent Labs: 07/28/2020: TSH 1.920 08/14/2020: B Natriuretic Peptide 135.2; Magnesium 2.3 08/15/2020: ALT 76 08/21/2020: BUN 14; Creatinine, Ser 0.65; Hemoglobin 15.2; Platelets 266; Potassium 4.7; Sodium 131  Recent Lipid Panel    Component Value Date/Time   CHOL 73 07/30/2020 0555   TRIG 29 07/30/2020 0555   HDL 34 (L) 07/30/2020 0555   CHOLHDL 2.1 07/30/2020 0555   VLDL 6 07/30/2020 0555   LDLCALC 33 07/30/2020 0555    Home Medications   Current Meds  Medication Sig   albuterol (PROVENTIL) (2.5 MG/3ML) 0.083% nebulizer solution Take 3 mLs (2.5 mg total) by nebulization every 6 (six) hours as needed for wheezing or shortness of breath.   albuterol (VENTOLIN HFA) 108 (90 Base) MCG/ACT inhaler Inhale 2 puffs into the lungs every 4 (four) hours as needed for wheezing or shortness of breath.   amiodarone (PACERONE) 200 MG tablet Take 1 tablet (200 mg) by mouth twice daily till 08/17/2020 and then take 1 tablet (200 mg) once daily from 08/18/2020 onwards as per cardiology   apixaban (ELIQUIS) 5 MG TABS tablet Take 1 tablet (5 mg total) by mouth 2 (two) times daily.   FLUoxetine (PROZAC) 20 MG capsule Take 20 mg by mouth at bedtime.   folic acid (FOLVITE) 1  MG tablet Take 1 tablet (1 mg total) by mouth daily.   lovastatin (MEVACOR) 20 MG tablet Take 20 mg by mouth daily.   metoprolol succinate (TOPROL XL) 25 MG 24 hr tablet Take 1 tablet (25 mg total) by mouth daily. Take with a half tablet (50mg ) of your 100mg  tablet to make a total dose of 75 mg daily.   montelukast (SINGULAIR) 10 MG tablet Take 10 mg by mouth daily.   Multiple Vitamin (MULTIVITAMIN WITH MINERALS) TABS tablet Take 1 tablet by mouth daily.   polyethylene glycol (MIRALAX / GLYCOLAX) 17 g packet Take 17 g by mouth daily as needed for mild constipation.   terazosin (HYTRIN) 5 MG capsule Take 5 mg by mouth daily.   thiamine 100 MG tablet Take 1 tablet (100 mg total) by mouth daily.   tiotropium (SPIRIVA) 18 MCG inhalation capsule Place 1 capsule (18 mcg total) into inhaler and inhale daily.   [DISCONTINUED] metoprolol succinate (TOPROL-XL) 100 MG 24 hr tablet Take 1 tablet (100 mg total) by mouth daily. Take with or immediately following a meal.     Review of Systems      All other systems  reviewed and are otherwise negative except as noted above.  Physical Exam    VS:  BP 118/70   Pulse 61   Ht 5\' 9"  (1.753 m)   Wt 192 lb 9.6 oz (87.4 kg)   SpO2 95%   BMI 28.44 kg/m  , BMI Body mass index is 28.44 kg/m.  Wt Readings from Last 3 Encounters:  09/25/20 192 lb 9.6 oz (87.4 kg)  08/23/20 193 lb 3.2 oz (87.6 kg)  08/21/20 192 lb 3.9 oz (87.2 kg)    GEN: Well nourished, well developed, in no acute distress. HEENT: normal. Neck: Supple, no JVD, carotid bruits, or masses. Cardiac: irregularly irregular, no murmurs, rubs, or gallops. No clubbing, cyanosis, edema.  Radials/PT 2+ and equal bilaterally.  Respiratory:  Respirations regular and unlabored, clear to auscultation bilaterally. GI: Soft, nontender, nondistended. MS: No deformity or atrophy. Skin: Warm and dry, no rash. Neuro:  Strength and sensation are intact. Psych: Normal affect.  Assessment & Plan     Wernicke's encephaolopathy - Recent admission. Mentation improving on folate and thiamine. Upcoming appointment to establish with neurology.   PAF / Chronic anticoagulation - CHAD2VASC of 4.  EKG today with rate controlled atrial fibrillation 61 bpm. He endorses lightheadedness and dyspnea on exertion but no palpitations. Had TEE/DCCV during admission but maintained NSR for only 2 days. Will plan for repeat cardioversion. No missed doses of Eliquis over last 3 months. No bleeding complications. Plan for BMP, CBC. Given lightheadedness and bradycardia at home, reduce Toprol from 100mg  to 75mg  QD. Continue Amiodarone 200mg  QD. No evidence of toxicity.  Shared Decision Making/Informed Consent{ The risks (stroke, cardiac arrhythmias rarely resulting in the need for a temporary or permanent pacemaker, skin irritation or burns and complications associated with conscious sedation including aspiration, arrhythmia, respiratory failure and death), benefits (restoration of normal sinus rhythm) and alternatives of a direct current cardioversion were explained in detail to Mr. Lichtman and he agrees to proceed.    HTN - Losartan, Lasix held during recent admission. BP low normal in clinic today but elevated at home. Encouraged to bring home monitor to assess accuracy. Will reduce Toprol from 100mg  QD to 75mg  QD (half of 100mg  tablet + one 25mg  tablet) due to lightheadedness and relative bradycardia.   HLD, LDL goal <70 - 7/24 22 LDL 33. Continue Lovastatin 20mg  daily.   HFpEF - Echo 07/2020 normal LVEF, gr2DD. Euvolemic and well compensated on exam. No indication for diuretic at this time. Low salt diet and fluid <2L restriction encouraged.  Coronary artery calcification on CT - Stable with no anginal symptoms. EKG today with no acute ST/T wave changes. No indication for ischemic evaluation. GDMT includes metoprolol, lovastatin. No aspirin due to chronic anticoagulation. Heart healthy diet and regular  cardiovascular exercise encouraged.    Tobacco use / ETOH use - Has completely abstained from etoh and tobacco since discharge and congratulated.  Disposition: Follow up in 3 week(s) with Dr. or APP.  Signed, , NP 09/25/2020, 5:04 PM [facility]

## 2020-09-25 NOTE — Patient Instructions (Signed)
Medication Instructions:  Your physician has recommended you make the following change in your medication:   CHANGE your Metoprolol Succinate (Toprol) to 75 mg daily Take one 25mg  tablet with a half-tablet of your 100mg  tablet for a total dose of 75mg   Check your pill bottles for Amlodipine (Norvasc) at home.   *If you need a refill on your cardiac medications before your next appointment, please call your pharmacy*   Lab Work: Your physician recommends that you return for lab work  BMP, CBC either 9/21, 9/22, 9/23 or 9/26 at [facility] in [facility]  If you have labs (blood work) drawn today and your tests are completely normal, you will receive your results only by: MyChart Message (if you have MyChart) OR A paper copy in the mail If you have any lab test that is abnormal or we need to change your treatment, we will call you to review the results.   Testing/Procedures: Your EKG today shows rate controlled atrial fibrillation.   Follow-Up: At [facility], you and your health needs are our priority.  As part of our continuing mission to provide you with exceptional heart care, we have created designated Provider Care Teams.  These Care Teams include your primary Cardiologist (physician) and Advanced Practice Providers (APPs -  Physician Assistants and Nurse Practitioners) who all work together to provide you with the care you need, when you need it.  We recommend signing up for the patient portal called "MyChart".  Sign up information is provided on this After Visit Summary.  MyChart is used to connect with patients for Virtual Visits (Telemedicine).  Patients are able to view lab/test results, encounter notes, upcoming appointments, etc.  Non-urgent messages can be sent to your provider as well.   To learn more about what you can do with MyChart, go to [facility].    Your next appointment:   1-2 weeks after cardioversion  The format for your next appointment:   In  Person  Provider:   You may see [facility], MD or one of the following Advanced Practice Providers on your designated Care Team:   [facility], PA-C [email or web address], PA-C Armanda Magic, NP   Other Instructions  You are scheduled for a TEE/Cardioversion/TEE Cardioversion on 10/04/20 with Dr. Jacolyn Reedy.  Please arrive at the [facility] (Main Entrance A) at [facility]: [address] [place], [facility] [address] at 8:30 am/pm.   DIET: Nothing to eat or drink after midnight except a sip of water with medications (see medication instructions below)  FYI: For your safety, and to allow [place] to monitor your vital signs accurately during the surgery/procedure we request that   if you have artificial nails, gel coating, SNS etc. Please have those removed prior to your surgery/procedure. Not having the nail coverings /polish removed may result in cancellation or delay of your surgery/procedure.   Medication Instructions: Continue your anticoagulant: Eliquis You will need to continue your anticoagulant after your procedure until you  are told by your  Provider that it is safe to stop  You must have a responsible person to drive you home and stay in the waiting area during your procedure. Failure to do so could result in cancellation.  Bring your insurance cards.  *Special Note: Every effort is made to have your procedure done on time. Occasionally there are emergencies that occur at the hospital that may cause delays. Please be patient if a delay does occur.   Electrical Cardioversion Electrical cardioversion is the  delivery of a jolt of electricity to restore a normal rhythm to the heart. A rhythm that is too fast or is not regular keeps the heart from pumping well. In this procedure, sticky patches or metal paddles are placed on the chest to deliver electricity to the heart from a device. This procedure may be done in an emergency if: There is low or no blood pressure as a result of the  heart rhythm. Normal rhythm must be restored as fast as possible to protect the brain and heart from further damage. It may save a life. This may also be a scheduled procedure for irregular or fast heart rhythms that are not immediately life-threatening. Tell a health care provider about: Any allergies you have. All medicines you are taking, including vitamins, herbs, eye drops, creams, and over-the-counter medicines. Any problems you or family members have had with anesthetic medicines. Any blood disorders you have. Any surgeries you have had. Any medical conditions you have. Whether you are pregnant or may be pregnant. What are the risks? Generally, this is a safe procedure. However, problems may occur, including: Allergic reactions to medicines. A blood clot that breaks free and travels to other parts of your body. The possible return of an abnormal heart rhythm within hours or days after the procedure. Your heart stopping (cardiac arrest). This is rare. What happens before the procedure? Medicines Your health care provider may have you start taking: Blood-thinning medicines (anticoagulants) so your blood does not clot as easily. Medicines to help stabilize your heart rate and rhythm. Ask your health care provider about: Changing or stopping your regular medicines. This is especially important if you are taking diabetes medicines or blood thinners. Taking medicines such as aspirin and ibuprofen. These medicines can thin your blood. Do not take these medicines unless your health care provider tells you to take them. Taking over-the-counter medicines, vitamins, herbs, and supplements. General instructions Follow instructions from your health care provider about eating or drinking restrictions. Plan to have someone take you home from the hospital or clinic. If you will be going home right after the procedure, plan to have someone with you for 24 hours. Ask your health care provider  what steps will be taken to help prevent infection. These may include washing your skin with a germ-killing soap. What happens during the procedure?  An IV will be inserted into one of your veins. Sticky patches (electrodes) or metal paddles may be placed on your chest. You will be given a medicine to help you relax (sedative). An electrical shock will be delivered. The procedure may vary among health care providers and hospitals. What can I expect after the procedure? Your blood pressure, heart rate, breathing rate, and blood oxygen level will be monitored until you leave the hospital or clinic. Your heart rhythm will be watched to make sure it does not change. You may have some redness on the skin where the shocks were given. Follow these instructions at home: Do not drive for 24 hours if you were given a sedative during your procedure. Take over-the-counter and prescription medicines only as told by your health care provider. Ask your health care provider how to check your pulse. Check it often. Rest for 48 hours after the procedure or as told by your health care provider. Avoid or limit your caffeine use as told by your health care provider. Keep all follow-up visits as told by your health care provider. This is important. Contact a health care provider if: You  feel like your heart is beating too quickly or your pulse is not regular. You have a serious muscle cramp that does not go away. Get help right away if: You have discomfort in your chest. You are dizzy or you feel faint. You have trouble breathing or you are short of breath. Your speech is slurred. You have trouble moving an arm or leg on one side of your body. Your fingers or toes turn cold or blue. Summary Electrical cardioversion is the delivery of a jolt of electricity to restore a normal rhythm to the heart. This procedure may be done right away in an emergency or may be a scheduled procedure if the condition is not an  emergency. Generally, this is a safe procedure. After the procedure, check your pulse often as told by your health care provider. This information is not intended to replace advice given to you by your health care provider. Make sure you discuss any questions you have with your health care provider. Document Revised: 07/27/2018 Document Reviewed: 07/27/2018 Elsevier Patient Education  2022 [employer].

## 2020-09-26 LAB — SARS CORONAVIRUS 2 (TAT 6-24 HRS): SARS Coronavirus 2: NEGATIVE

## 2020-09-27 ENCOUNTER — Emergency Department (HOSPITAL_BASED_OUTPATIENT_CLINIC_OR_DEPARTMENT_OTHER): Payer: Medicare Other

## 2020-09-27 ENCOUNTER — Telehealth: Payer: Self-pay | Admitting: Family

## 2020-09-27 ENCOUNTER — Ambulatory Visit (INDEPENDENT_AMBULATORY_CARE_PROVIDER_SITE_OTHER): Payer: Medicare Other | Admitting: Student

## 2020-09-27 ENCOUNTER — Inpatient Hospital Stay (HOSPITAL_BASED_OUTPATIENT_CLINIC_OR_DEPARTMENT_OTHER)
Admission: EM | Admit: 2020-09-27 | Discharge: 2020-10-04 | DRG: 083 | Disposition: A | Discharge status: Home Health | Payer: Medicare Other | Attending: Student | Admitting: Student

## 2020-09-27 ENCOUNTER — Other Ambulatory Visit: Payer: Self-pay

## 2020-09-27 ENCOUNTER — Encounter (HOSPITAL_BASED_OUTPATIENT_CLINIC_OR_DEPARTMENT_OTHER): Payer: Self-pay

## 2020-09-27 DIAGNOSIS — R0600 Dyspnea, unspecified: Secondary | ICD-10-CM

## 2020-09-27 DIAGNOSIS — Z7901 Long term (current) use of anticoagulants: Secondary | ICD-10-CM

## 2020-09-27 DIAGNOSIS — S06369A Traumatic hemorrhage of cerebrum, unspecified, with loss of consciousness of unspecified duration, initial encounter: Secondary | ICD-10-CM | POA: Diagnosis not present

## 2020-09-27 DIAGNOSIS — I619 Nontraumatic intracerebral hemorrhage, unspecified: Secondary | ICD-10-CM

## 2020-09-27 DIAGNOSIS — Z79899 Other long term (current) drug therapy: Secondary | ICD-10-CM

## 2020-09-27 DIAGNOSIS — I4821 Permanent atrial fibrillation: Secondary | ICD-10-CM | POA: Diagnosis present

## 2020-09-27 DIAGNOSIS — W010XXA Fall on same level from slipping, tripping and stumbling without subsequent striking against object, initial encounter: Secondary | ICD-10-CM | POA: Diagnosis present

## 2020-09-27 DIAGNOSIS — I629 Nontraumatic intracranial hemorrhage, unspecified: Secondary | ICD-10-CM

## 2020-09-27 DIAGNOSIS — I5032 Chronic diastolic (congestive) heart failure: Secondary | ICD-10-CM | POA: Diagnosis present

## 2020-09-27 DIAGNOSIS — E669 Obesity, unspecified: Secondary | ICD-10-CM | POA: Diagnosis present

## 2020-09-27 DIAGNOSIS — Z800 Family history of malignant neoplasm of digestive organs: Secondary | ICD-10-CM

## 2020-09-27 DIAGNOSIS — S061X9A Traumatic cerebral edema with loss of consciousness of unspecified duration, initial encounter: Secondary | ICD-10-CM | POA: Diagnosis present

## 2020-09-27 DIAGNOSIS — Z8249 Family history of ischemic heart disease and other diseases of the circulatory system: Secondary | ICD-10-CM

## 2020-09-27 DIAGNOSIS — E785 Hyperlipidemia, unspecified: Secondary | ICD-10-CM | POA: Diagnosis present

## 2020-09-27 DIAGNOSIS — Z6828 Body mass index (BMI) 28.0-28.9, adult: Secondary | ICD-10-CM

## 2020-09-27 DIAGNOSIS — H547 Unspecified visual loss: Secondary | ICD-10-CM | POA: Diagnosis present

## 2020-09-27 DIAGNOSIS — R0609 Other forms of dyspnea: Secondary | ICD-10-CM

## 2020-09-27 DIAGNOSIS — Z83438 Family history of other disorder of lipoprotein metabolism and other lipidemia: Secondary | ICD-10-CM

## 2020-09-27 DIAGNOSIS — Z8041 Family history of malignant neoplasm of ovary: Secondary | ICD-10-CM

## 2020-09-27 DIAGNOSIS — I110 Hypertensive heart disease with heart failure: Secondary | ICD-10-CM | POA: Diagnosis present

## 2020-09-27 DIAGNOSIS — N400 Enlarged prostate without lower urinary tract symptoms: Secondary | ICD-10-CM | POA: Diagnosis present

## 2020-09-27 DIAGNOSIS — S0990XA Unspecified injury of head, initial encounter: Secondary | ICD-10-CM | POA: Diagnosis not present

## 2020-09-27 DIAGNOSIS — I700 Atherosclerosis of aorta: Secondary | ICD-10-CM | POA: Diagnosis present

## 2020-09-27 DIAGNOSIS — J449 Chronic obstructive pulmonary disease, unspecified: Secondary | ICD-10-CM

## 2020-09-27 DIAGNOSIS — Z87891 Personal history of nicotine dependence: Secondary | ICD-10-CM

## 2020-09-27 DIAGNOSIS — Z20822 Contact with and (suspected) exposure to covid-19: Secondary | ICD-10-CM | POA: Diagnosis present

## 2020-09-27 DIAGNOSIS — F32A Depression, unspecified: Secondary | ICD-10-CM | POA: Diagnosis present

## 2020-09-27 HISTORY — DX: Nontraumatic intracerebral hemorrhage, unspecified: I61.9

## 2020-09-27 LAB — RESP PANEL BY RT-PCR (FLU A&B, COVID) ARPGX2
Influenza A by PCR: NEGATIVE
Influenza B by PCR: NEGATIVE
SARS Coronavirus 2 by RT PCR: NEGATIVE

## 2020-09-27 LAB — PULMONARY FUNCTION TEST
DL/VA % pred: 93 %
DL/VA: 3.8 ml/min/mmHg/L
DLCO cor % pred: 75 %
DLCO cor: 18.87 ml/min/mmHg
DLCO unc % pred: 77 %
DLCO unc: 19.29 ml/min/mmHg
FEF 25-75 Post: 1.16 L/sec
FEF 25-75 Pre: 0.79 L/sec
FEF2575-%Change-Post: 46 %
FEF2575-%Pred-Post: 49 %
FEF2575-%Pred-Pre: 33 %
FEV1-%Change-Post: 10 %
FEV1-%Pred-Post: 54 %
FEV1-%Pred-Pre: 49 %
FEV1-Post: 1.68 L
FEV1-Pre: 1.52 L
FEV1FVC-%Change-Post: 0 %
FEV1FVC-%Pred-Pre: 82 %
FEV6-%Change-Post: 11 %
FEV6-%Pred-Post: 70 %
FEV6-%Pred-Pre: 63 %
FEV6-Post: 2.79 L
FEV6-Pre: 2.51 L
FEV6FVC-%Change-Post: 0 %
FEV6FVC-%Pred-Post: 106 %
FEV6FVC-%Pred-Pre: 106 %
FVC-%Change-Post: 11 %
FVC-%Pred-Post: 66 %
FVC-%Pred-Pre: 59 %
FVC-Post: 2.79 L
FVC-Pre: 2.51 L
Post FEV1/FVC ratio: 60 %
Post FEV6/FVC ratio: 100 %
Pre FEV1/FVC ratio: 60 %
Pre FEV6/FVC Ratio: 100 %
RV % pred: 183 %
RV: 4.42 L
TLC % pred: 106 %
TLC: 7.29 L

## 2020-09-27 LAB — CBC WITH DIFFERENTIAL/PLATELET
Abs Immature Granulocytes: 0.03 10*3/uL (ref 0.00–0.07)
Basophils Absolute: 0 10*3/uL (ref 0.0–0.1)
Basophils Relative: 1 %
Eosinophils Absolute: 0.1 10*3/uL (ref 0.0–0.5)
Eosinophils Relative: 1 %
HCT: 43.8 % (ref 39.0–52.0)
Hemoglobin: 14.7 g/dL (ref 13.0–17.0)
Immature Granulocytes: 0 %
Lymphocytes Relative: 21 %
Lymphs Abs: 1.5 10*3/uL (ref 0.7–4.0)
MCH: 31.4 pg (ref 26.0–34.0)
MCHC: 33.6 g/dL (ref 30.0–36.0)
MCV: 93.6 fL (ref 80.0–100.0)
Monocytes Absolute: 0.8 10*3/uL (ref 0.1–1.0)
Monocytes Relative: 11 %
Neutro Abs: 4.8 10*3/uL (ref 1.7–7.7)
Neutrophils Relative %: 66 %
Platelets: 241 10*3/uL (ref 150–400)
RBC: 4.68 MIL/uL (ref 4.22–5.81)
RDW: 13.3 % (ref 11.5–15.5)
WBC: 7.2 10*3/uL (ref 4.0–10.5)
nRBC: 0 % (ref 0.0–0.2)

## 2020-09-27 LAB — COMPREHENSIVE METABOLIC PANEL
ALT: 31 U/L (ref 0–44)
AST: 27 U/L (ref 15–41)
Albumin: 3.6 g/dL (ref 3.5–5.0)
Alkaline Phosphatase: 107 U/L (ref 38–126)
Anion gap: 6 (ref 5–15)
BUN: 16 mg/dL (ref 8–23)
CO2: 27 mmol/L (ref 22–32)
Calcium: 8.9 mg/dL (ref 8.9–10.3)
Chloride: 101 mmol/L (ref 98–111)
Creatinine, Ser: 0.7 mg/dL (ref 0.61–1.24)
GFR, Estimated: >60 mL/min (ref >60)
Glucose, Bld: 83 mg/dL (ref 70–99)
Potassium: 4 mmol/L (ref 3.5–5.1)
Sodium: 134 mmol/L — ABNORMAL LOW (ref 135–145)
Total Bilirubin: 0.4 mg/dL (ref 0.3–1.2)
Total Protein: 6.8 g/dL (ref 6.5–8.1)

## 2020-09-27 LAB — PROTIME-INR
INR: 1.3 — ABNORMAL HIGH (ref 0.8–1.2)
Prothrombin Time: 16.5 seconds — ABNORMAL HIGH (ref 11.4–15.2)

## 2020-09-27 LAB — APTT: aPTT: 36 seconds (ref 24–36)

## 2020-09-27 MED ORDER — PROTHROMBIN COMPLEX CONC HUMAN 500 UNITS IV KIT
50.0000 [IU]/kg | PACK | Status: DC
  Filled 2020-09-27: qty 4330

## 2020-09-27 MED ORDER — LORAZEPAM 2 MG/ML IJ SOLN
0.5000 mg | Freq: Once | INTRAMUSCULAR | Status: AC
  Administered 2020-09-27: 0.5 mg via INTRAVENOUS
  Filled 2020-09-27: qty 1

## 2020-09-27 MED ORDER — ACETAMINOPHEN 500 MG PO TABS
1000.0000 mg | ORAL_TABLET | Freq: Once | ORAL | Status: AC
  Administered 2020-09-27: 1000 mg via ORAL
  Filled 2020-09-27: qty 2

## 2020-09-27 MED ORDER — PROTHROMBIN COMPLEX CONC HUMAN 500 UNITS IV KIT
PACK | INTRAVENOUS | Status: AC
  Filled 2020-09-27: qty 5000

## 2020-09-27 MED ORDER — PROTHROMBIN COMPLEX CONC HUMAN 500 UNITS IV KIT
4314.0000 [IU] | PACK | Status: AC
  Administered 2020-09-27: 4314 [IU] via INTRAVENOUS
  Filled 2020-09-27: qty 4314

## 2020-09-27 NOTE — Telephone Encounter (Signed)
Pt c/o medication issue:  1. Name of Medication: amlodipine   2. How are you currently taking this medication (dosage and times per day)? Not currently taking  3. Are you having a reaction (difficulty breathing--STAT)? no  4. What is your medication issue? Patient's daughter states the patient has not taken the medication since he was asked discontinue it. She says he also had a fall this morning, and has a cardioversion next week. She says he hit his head but has no major injuries. She states she does not need a call back and just wanted to make the office aware.

## 2020-09-27 NOTE — Telephone Encounter (Signed)
Spoke with pt's daughter and pt  re message and pt is no longer taking Amlodipine wanted to let Luther Parody know also pt had a fall this am and bumped head Per pt notes a raised area on back of head Encouraged to f/u with PCP or urgent care to get head ct for fall as pt is on Eliquis Per daughter called PCP and notified of fall and no CT was arranged [place] pt in background not wanting to have CT scan Will forward to Gillian Shields NP  for review and recommendations ./cy

## 2020-09-27 NOTE — Patient Instructions (Signed)
Full PFT performed today. °

## 2020-09-27 NOTE — ED Provider Notes (Signed)
[facility] EMERGENCY DEPARTMENT Provider Note   CSN: [id number] Arrival date & time: 09/27/20  1631     History Chief Complaint  Patient presents with   Head Injury    Samuel Barnes is a 70 y.o. male with PMHx HTN, HLD, chronic A fib on Eliquis who presents to the ED today for head injury that occurred earlier this morning. Per daughter at bedside she went to pt's home around 9 AM and was told by patient that he fell about 1 hour earlier. He was standing at the refrigerator when he lost his footing and fell backwards hitting the back of his head on hardwood floor. Pt does not think he passed out. He has been complaining of a slight headache since then; otherwise his daughter reports he is acting at baseline. She called his PCP regarding the fall/head injury and was advised they could just watch patient however she then called his cardiologist who advised he come to the ED for further eval given he is anticoagulated. Pt without any other complaints at this time. Daughter mentions that he typically walks with a walker however has been getting better with his mobility and stopped using it around the house about 2-3 days ago.   The history is provided by the patient, medical records and a relative.      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   Acute metabolic encephalopathy 08/16/2020   Syncope 08/14/2020   Hyperglycemia 07/29/2020   New onset a-fib [facility])    Acute congestive heart failure (HCC)    Acute respiratory failure with hypoxia (HCC) 07/28/2020   Pericardial effusion  07/28/2020   Aortic atherosclerosis (HCC) 07/28/2020   Coronary artery calcification 07/28/2020   Class 2 obesity 07/28/2020   Hyperlipidemia 07/28/2020   Tobacco use 07/28/2020   Prolonged QT interval 07/28/2020   Hypocalcemia 07/28/2020   Alcohol abuse 07/28/2020   Fatty liver 07/28/2020   BPH (benign prostatic hyperplasia)    Depression    Hypertension    Hyponatremia    Hypomagnesemia    New onset atrial fibrillation [facility])     Past Surgical History:  Procedure Laterality Date   CARDIOVERSION N/A 08/02/2020   Procedure: CARDIOVERSION;  Surgeon: Meriam Sprague, MD;  Location: [facility] ENDOSCOPY;  Service: Cardiovascular;  Laterality: N/A;   IR THORACENTESIS ASP PLEURAL SPACE W/IMG GUIDE  07/31/2020   TEE WITHOUT CARDIOVERSION N/A 08/02/2020   Procedure: TRANSESOPHAGEAL ECHOCARDIOGRAM (TEE);  Surgeon: Meriam Sprague, MD;  Location: [facility] ENDOSCOPY;  Service: Cardiovascular;  Laterality: N/A;   TRANSURETHRAL RESECTION OF PROSTATE         Family History  Problem Relation Age of Onset   Coronary artery disease Mother    Hypertension Mother    Hyperlipidemia Mother    Ovarian cancer Mother    Ovarian cancer Sister    Liver cancer Sister    Liver cancer Brother     Social History   Tobacco Use   Smoking status: Former    Types: Cigarettes    Start date: 1969    Quit date: 08/14/2020  Years since quitting: 0.1   Smokeless tobacco: Never  Vaping Use   Vaping Use: Never used  Substance Use Topics   Alcohol use: Not Currently    Alcohol/week: 1.0 standard drink    Types: 1 Shots of liquor per week   Drug use: Never    Home Medications Prior to Admission medications   Medication Sig Start Date End Date Taking? Authorizing Provider  acetaminophen (TYLENOL) 500 MG tablet Take 1,000 mg by mouth every 6 (six) hours as needed (pain.).    [provider]  albuterol (PROVENTIL) (2.5 MG/3ML) 0.083% nebulizer solution Take 3 mLs (2.5 mg total) by nebulization  every 6 (six) hours as needed for wheezing or shortness of breath. 08/23/20   Omar Person, MD  albuterol (VENTOLIN HFA) 108 (90 Base) MCG/ACT inhaler Inhale 2 puffs into the lungs every 4 (four) hours as needed for wheezing or shortness of breath. 08/08/20   Glade Lloyd, MD  amiodarone (PACERONE) 200 MG tablet Take 1 tablet (200 mg) by mouth twice daily till 08/17/2020 and then take 1 tablet (200 mg) once daily from 08/18/2020 onwards as per cardiology Patient taking differently: Take 200 mg by mouth in the morning. Take 1 tablet (200 mg) by mouth twice daily till 08/17/2020 and then take 1 tablet (200 mg) once daily from 08/18/2020 onwards as per cardiology 08/08/20   Glade Lloyd, MD  apixaban (ELIQUIS) 5 MG TABS tablet Take 1 tablet (5 mg total) by mouth 2 (two) times daily. 08/08/20   Glade Lloyd, MD  clotrimazole (LOTRIMIN) 1 % cream Apply 1 application topically 2 (two) times daily as needed (jock itch/groin discomfort).    [provider]  FLUoxetine (PROZAC) 20 MG capsule Take 20 mg by mouth at bedtime.    [provider]  folic acid (FOLVITE) 1 MG tablet Take 1 tablet (1 mg total) by mouth daily. 08/09/20   Glade Lloyd, MD  lovastatin (MEVACOR) 20 MG tablet Take 20 mg by mouth daily.    [provider]  metoprolol succinate (TOPROL XL) 25 MG 24 hr tablet Take 1 tablet (25 mg total) by mouth daily. Take with a half tablet (50mg ) of your 100mg  tablet to make a total dose of 75 mg daily. 09/25/20   , NP  metoprolol succinate (TOPROL-XL) 100 MG 24 hr tablet Take 0.5 tablets (50 mg total) by mouth daily. Take with your 25mg  tablet for a total dose of 75mg  daily. 09/25/20   Alver Sorrow, NP  montelukast (SINGULAIR) 10 MG tablet Take 10 mg by mouth daily.    [provider]  Multiple Vitamin (MULTIVITAMIN WITH MINERALS) TABS tablet Take 1 tablet by mouth daily. 08/09/20   , MD  polyethylene glycol (MIRALAX / GLYCOLAX) 17 g packet  Take 17 g by mouth daily as needed for mild constipation. Patient taking differently: Take 17 g by mouth daily. 08/21/20   Alver Sorrow, MD  terazosin (HYTRIN) 5 MG capsule Take 5 mg by mouth daily.    [provider]  thiamine 100 MG tablet Take 1 tablet (100 mg total) by mouth daily. 08/21/20   Glade Lloyd, MD  tiotropium (SPIRIVA) 18 MCG inhalation capsule Place 1 capsule (18 mcg total) into inhaler and inhale daily. 08/23/20   Rolly Salter, MD    Allergies    Ciprofloxacin and Gabapentin  Review of Systems   Review of Systems  Constitutional:  Negative for chills and fever.  Eyes:  Negative for  visual disturbance.  Gastrointestinal:  Negative for nausea and vomiting.  Musculoskeletal:  Negative for arthralgias, back pain and neck pain.  Neurological:  Positive for headaches. Negative for speech difficulty, weakness and numbness.  Psychiatric/Behavioral:  Negative for confusion.   All other systems reviewed and are negative.  Physical Exam Updated Vital Signs BP (!) 163/99   Pulse 80   Temp 98 F (36.7 C) (Oral)   Resp 15   Ht 5\' 9"  (1.753 m)   Wt 86.6 kg   SpO2 92%   BMI 28.21 kg/m   Physical Exam Vitals and nursing note reviewed.  Constitutional:      Appearance: He is not ill-appearing or diaphoretic.  HENT:     Head: Normocephalic.     Comments: Small hematoma noted to occiput. No raccoon's sign or battle's sign. Negative hemotympanum bilaterally.  Eyes:     Extraocular Movements: Extraocular movements intact.     Conjunctiva/sclera: Conjunctivae normal.     Pupils: Pupils are equal, round, and reactive to light.  Cardiovascular:     Rate and Rhythm: Normal rate and regular rhythm.  Pulmonary:     Effort: Pulmonary effort is normal.     Breath sounds: Normal breath sounds. No wheezing, rhonchi or rales.  Abdominal:     Palpations: Abdomen is soft.     Tenderness: There is no abdominal tenderness.  Musculoskeletal:     Cervical back: Neck  supple.  Skin:    General: Skin is warm and dry.  Neurological:     Mental Status: He is alert.     Comments: Alert and oriented to self, place, time and event.   Speech is fluent, clear without dysarthria or dysphasia.   Strength 5/5 in upper/lower extremities   Sensation intact in upper/lower extremities   Normal gait.  Negative Romberg. No pronator drift.  Normal finger-to-nose and feet tapping.  CN I not tested  CN II grossly intact visual fields bilaterally. Did not visualize posterior eye.  CN III, IV, VI PERRLA and EOMs intact bilaterally  CN V Intact sensation to sharp and light touch to the face  CN VII facial movements symmetric  CN VIII not tested  CN IX, X no uvula deviation, symmetric rise of soft palate  CN XI 5/5 SCM and trapezius strength bilaterally  CN XII Midline tongue protrusion, symmetric L/R movements      ED Results / Procedures / Treatments   Labs (all labs ordered are listed, but only abnormal results are displayed) Labs Reviewed  COMPREHENSIVE METABOLIC PANEL - Abnormal; Notable for the following components:      Result Value   Sodium 134 (*)    All other components within normal limits  PROTIME-INR - Abnormal; Notable for the following components:   Prothrombin Time 16.5 (*)    INR 1.3 (*)    All other components within normal limits  RESP PANEL BY RT-PCR (FLU A&B, COVID) ARPGX2  CBC WITH DIFFERENTIAL/PLATELET  APTT    EKG None  Radiology CT HEAD WO CONTRAST ( )  Result Date: 09/27/2020 CLINICAL DATA:  Status post trauma. EXAM: CT HEAD WITHOUT CONTRAST TECHNIQUE: Contiguous axial images were obtained from the base of the skull through the vertex without intravenous contrast. COMPARISON:  August 14, 2020 FINDINGS: Brain: There is mild cerebral atrophy with widening of the extra-axial spaces and ventricular dilatation. There are areas of decreased attenuation within the white matter tracts of the supratentorial brain, consistent with  microvascular disease changes. A 7 mm hyperdense focus  is seen within the subcortical white matter of the left occipital lobe. A mild amount of surrounding white matter low attenuation is seen. A mild amount of mass effect is noted on the adjacent sulci. There is no evidence of midline shift. Vascular: No hyperdense vessel or unexpected calcification. Skull: Normal. Negative for fracture or focal lesion. Sinuses/Orbits: Very mild bilateral ethmoid sinus mucosal thickening is seen. Other: None. IMPRESSION: 1. 7 mm acute parenchymal bleed and surrounding edema within the left occipital lobe. MRI correlation is recommended. 2. Generalized cerebral atrophy. Electronically Signed   By: Aram Candela M.D.   On: 09/27/2020 17:52   DG Knee Complete 4 Views Left  Result Date: 09/27/2020 CLINICAL DATA:  Status post fall. EXAM: LEFT KNEE - COMPLETE 4+ VIEW COMPARISON:  None. FINDINGS: No evidence of fracture, dislocation, or joint effusion. Mild medial and lateral tibiofemoral compartment space narrowing is seen. Moderate to marked severity vascular calcification is seen. IMPRESSION: 1. No acute osseous abnormality. Electronically Signed   By: Aram Candela M.D.   On: 09/27/2020 17:54    Procedures Procedures   Medications Ordered in ED Medications  prothrombin complex conc human (KCENTRA) injection (  Not Given 09/27/20 1940)  acetaminophen (TYLENOL) tablet 1,000 mg (has no administration in time range)  LORazepam (ATIVAN) injection 0.5 mg (0.5 mg Intravenous Given 09/27/20 1841)  prothrombin complex conc human (KCENTRA) IVPB 4,314 Units (0 Units Intravenous Stopped 09/27/20 1940)    ED Course  I have reviewed the triage vital signs and the nursing notes.  Pertinent labs & imaging results that were available during my care of the patient were reviewed by me and considered in my medical decision making (see chart for details).     MDM Rules/Calculators/A&P                           70 year old  male who presents to the ED today status post head injury/mechanical fall that occurred around 8 AM this morning.  Anticoagulated on Eliquis for chronic A. fib.  On arrival to the ED vitals are stable.  Patient had a CT scan done while in the waiting room which did show a 7 mm acute parenchymal bleed surrounding edema in the left occipital lobe.  Patient does have a hematoma in this area.  He does report he lost his footing and fell.  He stopped using his walker in the house 2 to 3 days ago, fall does sound mechanical.  Denies any prodrome.  We will plan to consult neurosurgery at this time, will plan for labs including coags.  We will touch base with pharmacy regarding possibility of reversing his Eliquis as he last took it earlier this morning.  Attending physician Dr. Anitra Lauth has evaluated patient as well and agrees with plan.  Discussed case with pharmacy at [facility].  Agrees to provide Kcentra for reversal.  Will place order.  Initially discussed case with neurosurgery Dr. Johnsie Cancel.  He states given the findings of edema on the CT scan he is less suspicious that this could be from trauma.  He recommends following with neurology with concern for possible acute ischemic versus hemorrhagic stroke causing the fall.   Discussed case with Dr. Otelia Limes.  CT scan does look like it is contusion in the brain versus actual bleed.  He does suspect this is from trauma given no prodrome.  He recommends discussing case with neurosurgery again.  We will reconsult neurosurgery.  Patient will need to  be admitted regardless.  Question medical admission, MRI, team to follow accordingly.  Dr. Maurice Small agrees with medical admission. I do not both neurology and neurosurgery need to evaluate patient once he gets admitted to [facility]. MRI ordered at this time.   Discussed case with Dr. Leafy Half who agrees to accept patient for admission.   This note was prepared using Dragon voice recognition software and may include  unintentional dictation errors due to the inherent limitations of voice recognition software.   Final Clinical Impression(s) / ED Diagnoses Final diagnoses:  Intracranial bleed [facility])    Rx / DC Orders ED Discharge Orders     None        Tanda Rockers, Cordelia Poche 09/27/20 2008    Gwyneth Sprout, MD 09/29/20 1334

## 2020-09-27 NOTE — Progress Notes (Signed)
Full PFT performed today. °

## 2020-09-27 NOTE — ED Triage Notes (Addendum)
Pt states he lost his balance and fell in his kitchen ~8am-hit back of head on floor-no LOC-no break in skin-slight hematoma noted-was advised by MD to come to ED-denies neck pain on palpation-c/o left knee pain-to triage with walker-NAD

## 2020-09-28 ENCOUNTER — Observation Stay (HOSPITAL_COMMUNITY): Payer: Medicare Other

## 2020-09-28 ENCOUNTER — Telehealth: Payer: Self-pay | Admitting: Family

## 2020-09-28 DIAGNOSIS — I700 Atherosclerosis of aorta: Secondary | ICD-10-CM | POA: Diagnosis not present

## 2020-09-28 DIAGNOSIS — W010XXA Fall on same level from slipping, tripping and stumbling without subsequent striking against object, initial encounter: Secondary | ICD-10-CM | POA: Diagnosis not present

## 2020-09-28 DIAGNOSIS — Z8249 Family history of ischemic heart disease and other diseases of the circulatory system: Secondary | ICD-10-CM | POA: Diagnosis not present

## 2020-09-28 DIAGNOSIS — Z83438 Family history of other disorder of lipoprotein metabolism and other lipidemia: Secondary | ICD-10-CM | POA: Diagnosis not present

## 2020-09-28 DIAGNOSIS — Z20822 Contact with and (suspected) exposure to covid-19: Secondary | ICD-10-CM | POA: Diagnosis not present

## 2020-09-28 DIAGNOSIS — I4821 Permanent atrial fibrillation: Secondary | ICD-10-CM | POA: Diagnosis not present

## 2020-09-28 DIAGNOSIS — Z87891 Personal history of nicotine dependence: Secondary | ICD-10-CM | POA: Diagnosis not present

## 2020-09-28 DIAGNOSIS — S0990XA Unspecified injury of head, initial encounter: Secondary | ICD-10-CM | POA: Diagnosis present

## 2020-09-28 DIAGNOSIS — Z6828 Body mass index (BMI) 28.0-28.9, adult: Secondary | ICD-10-CM | POA: Diagnosis not present

## 2020-09-28 DIAGNOSIS — Z79899 Other long term (current) drug therapy: Secondary | ICD-10-CM | POA: Diagnosis not present

## 2020-09-28 DIAGNOSIS — I629 Nontraumatic intracranial hemorrhage, unspecified: Secondary | ICD-10-CM | POA: Diagnosis not present

## 2020-09-28 DIAGNOSIS — E785 Hyperlipidemia, unspecified: Secondary | ICD-10-CM | POA: Diagnosis not present

## 2020-09-28 DIAGNOSIS — Z8041 Family history of malignant neoplasm of ovary: Secondary | ICD-10-CM | POA: Diagnosis not present

## 2020-09-28 DIAGNOSIS — E669 Obesity, unspecified: Secondary | ICD-10-CM | POA: Diagnosis not present

## 2020-09-28 DIAGNOSIS — S06369A Traumatic hemorrhage of cerebrum, unspecified, with loss of consciousness of unspecified duration, initial encounter: Secondary | ICD-10-CM | POA: Diagnosis not present

## 2020-09-28 DIAGNOSIS — N400 Enlarged prostate without lower urinary tract symptoms: Secondary | ICD-10-CM | POA: Diagnosis not present

## 2020-09-28 DIAGNOSIS — I5032 Chronic diastolic (congestive) heart failure: Secondary | ICD-10-CM | POA: Diagnosis not present

## 2020-09-28 DIAGNOSIS — F32A Depression, unspecified: Secondary | ICD-10-CM | POA: Diagnosis not present

## 2020-09-28 DIAGNOSIS — I110 Hypertensive heart disease with heart failure: Secondary | ICD-10-CM | POA: Diagnosis not present

## 2020-09-28 DIAGNOSIS — S061X9A Traumatic cerebral edema with loss of consciousness of unspecified duration, initial encounter: Secondary | ICD-10-CM | POA: Diagnosis not present

## 2020-09-28 DIAGNOSIS — Z7901 Long term (current) use of anticoagulants: Secondary | ICD-10-CM | POA: Diagnosis not present

## 2020-09-28 DIAGNOSIS — Z800 Family history of malignant neoplasm of digestive organs: Secondary | ICD-10-CM | POA: Diagnosis not present

## 2020-09-28 DIAGNOSIS — H547 Unspecified visual loss: Secondary | ICD-10-CM | POA: Diagnosis not present

## 2020-09-28 MED ORDER — METOPROLOL TARTRATE 25 MG PO TABS
25.0000 mg | ORAL_TABLET | Freq: Two times a day (BID) | ORAL | Status: DC
  Administered 2020-09-28 – 2020-10-04 (×12): 25 mg via ORAL
  Filled 2020-09-28 (×12): qty 1

## 2020-09-28 MED ORDER — FLUOXETINE HCL 20 MG PO CAPS
20.0000 mg | ORAL_CAPSULE | Freq: Every day | ORAL | Status: DC
  Administered 2020-09-28 – 2020-10-03 (×6): 20 mg via ORAL
  Filled 2020-09-28 (×6): qty 1

## 2020-09-28 MED ORDER — AMIODARONE HCL 200 MG PO TABS
200.0000 mg | ORAL_TABLET | Freq: Every morning | ORAL | Status: DC
  Administered 2020-09-28 – 2020-10-04 (×6): 200 mg via ORAL
  Filled 2020-09-28 (×6): qty 1

## 2020-09-28 MED ORDER — METOPROLOL SUCCINATE ER 25 MG PO TB24: 25.0000 mg | ORAL_TABLET | Freq: Every day | ORAL | Status: DC

## 2020-09-28 MED ORDER — ACETAMINOPHEN 325 MG PO TABS
650.0000 mg | ORAL_TABLET | Freq: Four times a day (QID) | ORAL | Status: DC | PRN
  Administered 2020-10-04: 650 mg via ORAL
  Filled 2020-09-28: qty 2

## 2020-09-28 MED ORDER — UMECLIDINIUM BROMIDE 62.5 MCG/INH IN AEPB
1.0000 | INHALATION_SPRAY | Freq: Every day | RESPIRATORY_TRACT | Status: DC
  Filled 2020-09-28: qty 7

## 2020-09-28 MED ORDER — METOPROLOL SUCCINATE ER 50 MG PO TB24
50.0000 mg | ORAL_TABLET | Freq: Every day | ORAL | Status: DC
  Administered 2020-09-28: 50 mg via ORAL
  Filled 2020-09-28: qty 2

## 2020-09-28 MED ORDER — MONTELUKAST SODIUM 10 MG PO TABS
10.0000 mg | ORAL_TABLET | Freq: Every day | ORAL | Status: DC
  Administered 2020-09-28 – 2020-10-03 (×6): 10 mg via ORAL
  Filled 2020-09-28 (×6): qty 1

## 2020-09-28 MED ORDER — MELATONIN 3 MG PO TABS
3.0000 mg | ORAL_TABLET | Freq: Every day | ORAL | Status: DC
  Administered 2020-09-28 – 2020-10-03 (×6): 3 mg via ORAL
  Filled 2020-09-28 (×6): qty 1

## 2020-09-28 MED ORDER — TIOTROPIUM BROMIDE MONOHYDRATE 18 MCG IN CAPS: 18.0000 ug | ORAL_CAPSULE | Freq: Every day | RESPIRATORY_TRACT | Status: DC

## 2020-09-28 MED ORDER — PRAVASTATIN SODIUM 20 MG PO TABS
20.0000 mg | ORAL_TABLET | Freq: Every day | ORAL | Status: DC
  Filled 2020-09-28: qty 1

## 2020-09-28 MED ORDER — LORAZEPAM 2 MG/ML IJ SOLN
1.0000 mg | Freq: Once | INTRAMUSCULAR | Status: AC
  Administered 2020-09-28: 1 mg via INTRAVENOUS
  Filled 2020-09-28: qty 1

## 2020-09-28 MED ORDER — UMECLIDINIUM BROMIDE 62.5 MCG/INH IN AEPB
1.0000 | INHALATION_SPRAY | Freq: Every day | RESPIRATORY_TRACT | Status: DC
  Administered 2020-09-30 – 2020-10-04 (×5): 1 via RESPIRATORY_TRACT
  Filled 2020-09-28: qty 7

## 2020-09-28 MED ORDER — TERAZOSIN HCL 5 MG PO CAPS
5.0000 mg | ORAL_CAPSULE | Freq: Every day | ORAL | Status: DC
  Administered 2020-09-28 – 2020-10-04 (×7): 5 mg via ORAL
  Filled 2020-09-28 (×9): qty 1

## 2020-09-28 MED ORDER — THIAMINE HCL 100 MG PO TABS
100.0000 mg | ORAL_TABLET | Freq: Every day | ORAL | Status: DC
  Administered 2020-09-28 – 2020-10-04 (×7): 100 mg via ORAL
  Filled 2020-09-28 (×7): qty 1

## 2020-09-28 MED ORDER — TERAZOSIN HCL 5 MG PO CAPS: 5.0000 mg | ORAL_CAPSULE | Freq: Every day | ORAL | Status: DC

## 2020-09-28 MED ORDER — ALBUTEROL SULFATE HFA 108 (90 BASE) MCG/ACT IN AERS
2.0000 | INHALATION_SPRAY | RESPIRATORY_TRACT | Status: DC | PRN
  Filled 2020-09-28: qty 6.7

## 2020-09-28 MED ORDER — ACETAMINOPHEN 650 MG RE SUPP: 650.0000 mg | Freq: Four times a day (QID) | RECTAL | Status: DC | PRN

## 2020-09-28 MED ORDER — PRAVASTATIN SODIUM 20 MG PO TABS: 20.0000 mg | ORAL_TABLET | Freq: Every day | ORAL | Status: DC

## 2020-09-28 MED ORDER — HYDRALAZINE HCL 25 MG PO TABS: 25.0000 mg | ORAL_TABLET | Freq: Four times a day (QID) | ORAL | Status: DC | PRN

## 2020-09-28 MED ORDER — ACETAMINOPHEN 500 MG PO TABS
1000.0000 mg | ORAL_TABLET | Freq: Four times a day (QID) | ORAL | Status: DC | PRN
  Administered 2020-09-30 – 2020-10-03 (×6): 1000 mg via ORAL
  Filled 2020-09-28 (×6): qty 2

## 2020-09-28 MED ORDER — MONTELUKAST SODIUM 10 MG PO TABS: 10.0000 mg | ORAL_TABLET | Freq: Every day | ORAL | Status: DC

## 2020-09-28 NOTE — ED Provider Notes (Signed)
Patient was reassessed this morning.  He is still pending a bed at [facility].  Vital signs remained stable overnight, blood pressure 140s systolic in the room.  Heart rate 60 bpm and A Fib.  He is mentating well, reports minimal headache, and is otherwise in good spirits.  He is hungry.  I have reordered his home medications, holding only his Eliquis.  His cardiologist did recently attempt to scale back on his metoprolol dosing due to bradycardia and episodes of lightheadedness.  He was dropped from 100 mg to 75 mg 3 days ago, I will reorder at 50 mg today, given his HR at 60 bpm here.       Terald Sleeper, MD 09/28/20 [phone]

## 2020-09-28 NOTE — ED Notes (Addendum)
[facility] called for transport ED to ED.

## 2020-09-28 NOTE — Telephone Encounter (Signed)
Spoke to patient's daughter Lorene Dy Caitlin's advice given.Spoke to [place] at [facility] hosp cardioversion scheduled for 9/28 cancelled.Advised to keep follow up appointment already scheduled with Gillian Shields NP 10/12 at 10:00 am.

## 2020-09-28 NOTE — H&P (Signed)
History and Physical    Samuel Barnes MIW:[id number] DOB: [birth year] DOA: 09/27/2020  PCP: [name], PA (Confirm with patient/family/NH records and if not entered, this has to be entered at [facility] point of entry) Patient coming from:   I have personally briefly reviewed patient's old medical records in [facility]  Chief Complaint: Feeling ok  HPI: Samuel Barnes is a 70 y.o. male with medical history significant of chronic A. fib on Eliquis, hypertension, CAD, chronic diastolic CHF, COPD, BPH, presented with near syncope fall and intracranial bleed.  Patient was diagnosed with new onset of A. fib in July to August this year, has been followed by cardiology, rate controlled with commendation of amiodarone and metoprolol.  Daily, patient has experienced frequent episodes of near syncope in the mornings especially after taking his morning medication, particularly he himself attributed the near syncope episode just to 3 medications including metoprolol, amiodarone and Spiriva inhaler.  Last week, his cardiologist cut down his metoprolol from 100 mg daily to 75 mg daily:  50 mg in the morning and 25 mg in the evening.  Despite, he still experienced episodes of near syncope and yesterday morning, after taking all his morning medications, patient started can feel lightheaded and then fell backwards (he intentionally voided falling forward to prevent his head or neck hitting the glass tabletop in front of him).  As of consciousness, denied any numbness or weakness of any of the limbs.  Right now, patient denied any headache, no feeling nauseous or vomiting.  No chest pains or shortness of breath.  ED Course: Vital signs, rate controlled A. fib, blood pressure slightly increased, CT head showed 7 mm acute parenchymal bleed surrounding edema within the left occipital lobe.  Review of Systems: As per HPI otherwise 14 point review of systems negative.    Past Medical History:   Diagnosis Date   Aortic atherosclerosis (HCC) 07/28/2020   BPH (benign prostatic hyperplasia)    Class 2 obesity 07/28/2020   Coronary artery calcification 07/28/2020   Depression    Family history of adverse reaction to anesthesia    Hyperlipidemia 07/28/2020   Hypertension    Tobacco use 07/28/2020    Past Surgical History:  Procedure Laterality Date   CARDIOVERSION N/A 08/02/2020   Procedure: CARDIOVERSION;  Surgeon: Meriam Sprague, MD;  Location: [facility] ENDOSCOPY;  Service: Cardiovascular;  Laterality: N/A;   IR THORACENTESIS ASP PLEURAL SPACE W/IMG GUIDE  07/31/2020   TEE WITHOUT CARDIOVERSION N/A 08/02/2020   Procedure: TRANSESOPHAGEAL ECHOCARDIOGRAM (TEE);  Surgeon: Meriam Sprague, MD;  Location: [facility] ENDOSCOPY;  Service: Cardiovascular;  Laterality: N/A;   TRANSURETHRAL RESECTION OF PROSTATE       reports that he quit smoking about 6 weeks ago. His smoking use included cigarettes. He started smoking about 53 years ago. He has never used smokeless tobacco. He reports that he does not currently use alcohol after a past usage of about 1.0 standard drink per week. He reports that he does not use drugs.  Allergies  Allergen Reactions   Ciprofloxacin Shortness Of Breath and Other (See Comments)    Numbness in extremities    Gabapentin Other (See Comments)    Mental problems, depression, anger    Family History  Problem Relation Age of Onset   Coronary artery disease Mother    Hypertension Mother    Hyperlipidemia Mother    Ovarian cancer Mother    Ovarian cancer Sister    Liver cancer Sister  Liver cancer Brother      Prior to Admission medications   Medication Sig Start Date End Date Taking? Authorizing Provider  acetaminophen (TYLENOL) 500 MG tablet Take 1,000 mg by mouth every 6 (six) hours as needed (pain.).   Yes [provider]  albuterol (PROVENTIL) (2.5 MG/3ML) 0.083% nebulizer solution Take 3 mLs (2.5 mg total) by nebulization every 6 (six)  hours as needed for wheezing or shortness of breath. 08/23/20  Yes Omar Person, MD  albuterol (VENTOLIN HFA) 108 (90 Base) MCG/ACT inhaler Inhale 2 puffs into the lungs every 4 (four) hours as needed for wheezing or shortness of breath. 08/08/20  Yes Glade Lloyd, MD  amiodarone (PACERONE) 200 MG tablet Take 1 tablet (200 mg) by mouth twice daily till 08/17/2020 and then take 1 tablet (200 mg) once daily from 08/18/2020 onwards as per cardiology Patient taking differently: Take 200 mg by mouth in the morning. Take 1 tablet (200 mg) by mouth twice daily till 08/17/2020 and then take 1 tablet (200 mg) once daily from 08/18/2020 onwards as per cardiology 08/08/20  Yes Glade Lloyd, MD  apixaban (ELIQUIS) 5 MG TABS tablet Take 1 tablet (5 mg total) by mouth 2 (two) times daily. 08/08/20  Yes Glade Lloyd, MD  clotrimazole (LOTRIMIN) 1 % cream Apply 1 application topically 2 (two) times daily as needed (jock itch/groin discomfort).   Yes [provider]  FLUoxetine (PROZAC) 20 MG capsule Take 20 mg by mouth at bedtime.   Yes [provider]  folic acid (FOLVITE) 1 MG tablet Take 1 tablet (1 mg total) by mouth daily. 08/09/20  Yes Glade Lloyd, MD  lovastatin (MEVACOR) 20 MG tablet Take 20 mg by mouth daily.   Yes [provider]  metoprolol succinate (TOPROL-XL) 100 MG 24 hr tablet Take 0.5 tablets (50 mg total) by mouth daily. Take with your 25mg  tablet for a total dose of 75mg  daily. 09/25/20  Yes , NP  montelukast (SINGULAIR) 10 MG tablet Take 10 mg by mouth daily.   Yes [provider]  Multiple Vitamin (MULTIVITAMIN WITH MINERALS) TABS tablet Take 1 tablet by mouth daily. 08/09/20  Yes Alver Sorrow, MD  polyethylene glycol (MIRALAX / GLYCOLAX) 17 g packet Take 17 g by mouth daily as needed for mild constipation. Patient taking differently: Take 17 g by mouth daily. 08/21/20  Yes Glade Lloyd, MD  terazosin (HYTRIN) 5 MG capsule Take 5 mg by mouth  daily.   Yes [provider]  thiamine 100 MG tablet Take 1 tablet (100 mg total) by mouth daily. 08/21/20  Yes Rolly Salter, MD  tiotropium (SPIRIVA) 18 MCG inhalation capsule Place 1 capsule (18 mcg total) into inhaler and inhale daily. 08/23/20  Yes Rolly Salter, MD  metoprolol succinate (TOPROL XL) 25 MG 24 hr tablet Take 1 tablet (25 mg total) by mouth daily. Take with a half tablet (50mg ) of your 100mg  tablet to make a total dose of 75 mg daily. 09/25/20   Omar Person, NP    Physical Exam: Vitals:   09/28/20 0300 09/28/20 0634 09/28/20 0900 09/28/20 1158  BP: 131/84 (!) 159/93 (!) 175/98 (!) 153/96  Pulse: (!) 55 72 (!) 59 60  Resp: 14 (!) 23 12 20   Temp:  (!) 97.1 F (36.2 C)  97.6 F (36.4 C)  TempSrc:  Oral  Axillary  SpO2: 90% 93% 92% 93%  Weight:      Height:  Constitutional: NAD, calm, comfortable Vitals:   09/28/20 0300 09/28/20 0634 09/28/20 0900 09/28/20 1158  BP: 131/84 (!) 159/93 (!) 175/98 (!) 153/96  Pulse: (!) 55 72 (!) 59 60  Resp: 14 (!) 23 12 20   Temp:  (!) 97.1 F (36.2 C)  97.6 F (36.4 C)  TempSrc:  Oral  Axillary  SpO2: 90% 93% 92% 93%  Weight:      Height:       Eyes: PERRL, lids and conjunctivae normal ENMT: Mucous membranes are moist. Posterior pharynx clear of any exudate or lesions.Normal dentition.  Neck: normal, supple, no masses, no thyromegaly Respiratory: clear to auscultation bilaterally, no wheezing, no crackles. Normal respiratory effort. No accessory muscle use.  Cardiovascular: Irregular heart rate, no murmurs / rubs / gallops. No extremity edema. 2+ pedal pulses. No carotid bruits.  Abdomen: no tenderness, no masses palpated. No hepatosplenomegaly. Bowel sounds positive.  Musculoskeletal: no clubbing / cyanosis. No joint deformity upper and lower extremities. Good ROM, no contractures. Normal muscle tone.  Skin: no rashes, lesions, ulcers. No induration Neurologic: CN 2-12 grossly intact. Sensation  intact, DTR normal. Strength 5/5 in all 4.  Psychiatric: Normal judgment and insight. Alert and oriented x 3. Normal mood.     Labs on Admission: I have personally reviewed following labs and imaging studies  CBC: Recent Labs  Lab 09/27/20 1813  WBC 7.2  NEUTROABS 4.8  HGB 14.7  HCT 43.8  MCV 93.6  PLT 241   Basic Metabolic Panel: Recent Labs  Lab 09/27/20 1813  NA 134*  K 4.0  CL 101  CO2 27  GLUCOSE 83  BUN 16  CREATININE 0.70  CALCIUM 8.9   GFR: Estimated Creatinine Clearance: 92.4 mL/min (by C-G formula based on SCr of 0.7 mg/dL). Liver Function Tests: Recent Labs  Lab 09/27/20 1813  AST 27  ALT 31  ALKPHOS 107  BILITOT 0.4  PROT 6.8  ALBUMIN 3.6   No results for input(s): LIPASE, AMYLASE in the last 168 hours. No results for input(s): AMMONIA in the last 168 hours. Coagulation Profile: Recent Labs  Lab 09/27/20 1813  INR 1.3*   Cardiac Enzymes: No results for input(s): CKTOTAL, CKMB, CKMBINDEX, TROPONINI in the last 168 hours. BNP (last 3 results) No results for input(s): PROBNP in the last 8760 hours. HbA1C: No results for input(s): HGBA1C in the last 72 hours. CBG: No results for input(s): GLUCAP in the last 168 hours. Lipid Profile: No results for input(s): CHOL, HDL, LDLCALC, TRIG, CHOLHDL, LDLDIRECT in the last 72 hours. Thyroid Function Tests: No results for input(s): TSH, T4TOTAL, FREET4, T3FREE, THYROIDAB in the last 72 hours. Anemia Panel: No results for input(s): VITAMINB12, FOLATE, FERRITIN, TIBC, IRON, RETICCTPCT in the last 72 hours. Urine analysis:    Component Value Date/Time   COLORURINE YELLOW 08/14/2020 1440   APPEARANCEUR HAZY (A) 08/14/2020 1440   LABSPEC 1.036 (H) 08/14/2020 1440   PHURINE 5.0 08/14/2020 1440   GLUCOSEU NEGATIVE 08/14/2020 1440   HGBUR NEGATIVE 08/14/2020 1440   BILIRUBINUR NEGATIVE 08/14/2020 1440   KETONESUR NEGATIVE 08/14/2020 1440   PROTEINUR NEGATIVE 08/14/2020 1440   NITRITE NEGATIVE  08/14/2020 1440   LEUKOCYTESUR NEGATIVE 08/14/2020 1440    Radiological Exams on Admission: CT HEAD WO CONTRAST (10/14/2020)  Result Date: 09/27/2020 CLINICAL DATA:  Status post trauma. EXAM: CT HEAD WITHOUT CONTRAST TECHNIQUE: Contiguous axial images were obtained from the base of the skull through the vertex without intravenous contrast. COMPARISON:  August 14, 2020 FINDINGS: Brain: There is  mild cerebral atrophy with widening of the extra-axial spaces and ventricular dilatation. There are areas of decreased attenuation within the white matter tracts of the supratentorial brain, consistent with microvascular disease changes. A 7 mm hyperdense focus is seen within the subcortical white matter of the left occipital lobe. A mild amount of surrounding white matter low attenuation is seen. A mild amount of mass effect is noted on the adjacent sulci. There is no evidence of midline shift. Vascular: No hyperdense vessel or unexpected calcification. Skull: Normal. Negative for fracture or focal lesion. Sinuses/Orbits: Very mild bilateral ethmoid sinus mucosal thickening is seen. Other: None. IMPRESSION: 1. 7 mm acute parenchymal bleed and surrounding edema within the left occipital lobe. MRI correlation is recommended. 2. Generalized cerebral atrophy. Electronically Signed   By: Aram Candela M.D.   On: 09/27/2020 17:52   MR BRAIN WO CONTRAST  Result Date: 09/28/2020 EXAM: MRI HEAD WITHOUT CONTRAST TECHNIQUE: Multiplanar, multiecho pulse sequences of the brain and surrounding structures were obtained without intravenous contrast. COMPARISON:  CT head 09/27/2020.  MRI 08/16/2020. FINDINGS: Brain: Acute hemorrhage in the left occipital lobe with associated T1 hyperintensity and susceptibility artifact. When comparing across modalities, the size of the hemorrhage is likely similar. There is surrounding edema without significant mass effect. No surrounding restricted diffusion to suggest acute infarct. In the region of  hemorrhage there is a tubular T2 hypointense flow void, compatible with prominent vessel that courses anterolaterally towards the left lateral ventricle (for example, see series 5, image 12). No midline shift, hydrocephalus, or extra-axial fluid collection. Additional mild scattered T2 hyperintensities within the white matter, nonspecific but compatible with chronic microvascular ischemic disease. Vascular: Major arterial flow voids are maintained at the skull base. Flow void in the region of the left occipital hemorrhage is detailed above. Skull and upper cervical spine: Normal marrow signal. Sinuses/Orbits: Clear sinuses.  No acute orbital findings. Other: No mastoid effusions. IMPRESSION: 1. Acute left occipital intraparenchymal hemorrhage with surrounding edema. There is a prominent vessel in this region, which courses towards the left lateral ventricle. Recommend postcontrast MRI and CTA to further evaluate and to exclude a vascular malformation as a cause of the hemorrhage. 2. No surrounding restricted diffusion to suggest acute infarct. Electronically Signed   By: Feliberto Harts M.D.   On: 09/28/2020 13:48   DG Knee Complete 4 Views Left  Result Date: 09/27/2020 CLINICAL DATA:  Status post fall. EXAM: LEFT KNEE - COMPLETE 4+ VIEW COMPARISON:  None. FINDINGS: No evidence of fracture, dislocation, or joint effusion. Mild medial and lateral tibiofemoral compartment space narrowing is seen. Moderate to marked severity vascular calcification is seen. IMPRESSION: 1. No acute osseous abnormality. Electronically Signed   By: Aram Candela M.D.   On: 09/27/2020 17:54    EKG: Independently reviewed.  Chronic A. fib.  Assessment/Plan Active Problems:   Cerebral parenchymal hemorrhage (HCC)  (please populate well all problems here in Problem List. (For example, if patient is on BP meds at home and you resume or decide to hold them, it is a problem that needs to be her. Same for CAD, COPD, HLD and so  on)  Acute intracranial parenchymal hemorrhage -Secondary to fall and on top of systemic anticoagulation -MRI today showed acute left occipital intraparenchymal hemorrhage with surrounding edema, low suspicion for acute infarct. -MRI further reported that there is a prominent vessel in the bleeding region, which courses towards the left lateral ventricle recommend MRI with contrast vs CTA.  Further work-up as per neurology and  neuro surgery. -More stringent control of blood pressure, aiming at systolic 140-160, add as needed hydralazine for this purpose.  Recurrent near syncope -Suspected symptomatic bradycardia -Further cut down metoprolol to 25 mg twice daily -Move Spiriva to evening.  Discussed this with patient at bedside, who expressed understanding and agreed. -Orthostatic vital signs in the morning, PT evaluation in the morning.  Chronic A. Fib -Rate control as above -Hold Eliquis 7 days?  COPD -At baseline  Chronic diastolic CHF -Euvolemic, adjust metoprolol dose.  DVT prophylaxis: SCD Code Status: Full Code Family Communication: None at bedside Disposition Plan: Expect 1 to 2 days hospital stay, Consults called: Neurosurgery and neurology Admission status: Telemetry observation.   Emeline General MD [facility] Pager [phone]  09/28/2020, 3:21 PM

## 2020-09-28 NOTE — Telephone Encounter (Signed)
Spoke to patient's daughter Lorene Dy.Stated she wanted Gillian Shields NP to know father fell yesterday.Stated she took him to ED at [facility] head ct was positive for a bleed or a stroke.Stated he is waiting to be transferred to [facility] for a MRI.Advised I hope he is better soon.I will make Gillian Shields NP aware.

## 2020-09-28 NOTE — Plan of Care (Signed)

## 2020-09-28 NOTE — Telephone Encounter (Signed)
Thank you for the update. Glad they went to the ED - that was the right decision. He does have cardioversion presently scheduled for 10/04/20 which we will need to cancel as he has to have 3 weeks of uninterrupted anticoagulation prior to cardioversion and his Eliquis is presently on hold. He can follow up as scheduled and we can readdress at that time. If the hospitalist has questions regarding atrial fib or Eliquis they will consult our team.   Best,  Alver Sorrow, NP

## 2020-09-28 NOTE — Telephone Encounter (Signed)
Note not routed to me yesterday, but agree with recommendation for ED visit for fall in setting of anticoagulation. Reviewed today when notified by telephone encounter 09/28/20 - please see that encounter and hospital admission for further details.   Alver Sorrow, NP

## 2020-09-28 NOTE — Telephone Encounter (Signed)
Pt's daughter Lorene Dy is calling to let Gillian Shields know that pt is currently in the ER awaiting transfer to [facility] with a head bleed after falling yesterday.

## 2020-09-29 ENCOUNTER — Observation Stay (HOSPITAL_COMMUNITY): Payer: Medicare Other

## 2020-09-29 DIAGNOSIS — S0990XA Unspecified injury of head, initial encounter: Secondary | ICD-10-CM | POA: Diagnosis present

## 2020-09-29 DIAGNOSIS — I629 Nontraumatic intracranial hemorrhage, unspecified: Secondary | ICD-10-CM | POA: Diagnosis not present

## 2020-09-29 DIAGNOSIS — Z800 Family history of malignant neoplasm of digestive organs: Secondary | ICD-10-CM | POA: Diagnosis not present

## 2020-09-29 DIAGNOSIS — Q282 Arteriovenous malformation of cerebral vessels: Secondary | ICD-10-CM | POA: Diagnosis not present

## 2020-09-29 DIAGNOSIS — I480 Paroxysmal atrial fibrillation: Secondary | ICD-10-CM | POA: Diagnosis not present

## 2020-09-29 DIAGNOSIS — R001 Bradycardia, unspecified: Secondary | ICD-10-CM | POA: Diagnosis not present

## 2020-09-29 DIAGNOSIS — Z8249 Family history of ischemic heart disease and other diseases of the circulatory system: Secondary | ICD-10-CM | POA: Diagnosis not present

## 2020-09-29 DIAGNOSIS — Z79899 Other long term (current) drug therapy: Secondary | ICD-10-CM | POA: Diagnosis not present

## 2020-09-29 DIAGNOSIS — N400 Enlarged prostate without lower urinary tract symptoms: Secondary | ICD-10-CM | POA: Diagnosis present

## 2020-09-29 DIAGNOSIS — I4821 Permanent atrial fibrillation: Secondary | ICD-10-CM | POA: Diagnosis present

## 2020-09-29 DIAGNOSIS — I700 Atherosclerosis of aorta: Secondary | ICD-10-CM | POA: Diagnosis present

## 2020-09-29 DIAGNOSIS — Z6828 Body mass index (BMI) 28.0-28.9, adult: Secondary | ICD-10-CM | POA: Diagnosis not present

## 2020-09-29 DIAGNOSIS — F32A Depression, unspecified: Secondary | ICD-10-CM | POA: Diagnosis present

## 2020-09-29 DIAGNOSIS — Z83438 Family history of other disorder of lipoprotein metabolism and other lipidemia: Secondary | ICD-10-CM | POA: Diagnosis not present

## 2020-09-29 DIAGNOSIS — Z20822 Contact with and (suspected) exposure to covid-19: Secondary | ICD-10-CM | POA: Diagnosis present

## 2020-09-29 DIAGNOSIS — S06369A Traumatic hemorrhage of cerebrum, unspecified, with loss of consciousness of unspecified duration, initial encounter: Secondary | ICD-10-CM | POA: Diagnosis present

## 2020-09-29 DIAGNOSIS — I5032 Chronic diastolic (congestive) heart failure: Secondary | ICD-10-CM | POA: Diagnosis present

## 2020-09-29 DIAGNOSIS — I10 Essential (primary) hypertension: Secondary | ICD-10-CM | POA: Diagnosis not present

## 2020-09-29 DIAGNOSIS — I110 Hypertensive heart disease with heart failure: Secondary | ICD-10-CM | POA: Diagnosis present

## 2020-09-29 DIAGNOSIS — H547 Unspecified visual loss: Secondary | ICD-10-CM | POA: Diagnosis present

## 2020-09-29 DIAGNOSIS — Z7901 Long term (current) use of anticoagulants: Secondary | ICD-10-CM | POA: Diagnosis not present

## 2020-09-29 DIAGNOSIS — Z8041 Family history of malignant neoplasm of ovary: Secondary | ICD-10-CM | POA: Diagnosis not present

## 2020-09-29 DIAGNOSIS — E785 Hyperlipidemia, unspecified: Secondary | ICD-10-CM | POA: Diagnosis present

## 2020-09-29 DIAGNOSIS — I6523 Occlusion and stenosis of bilateral carotid arteries: Secondary | ICD-10-CM | POA: Diagnosis not present

## 2020-09-29 DIAGNOSIS — S061X9A Traumatic cerebral edema with loss of consciousness of unspecified duration, initial encounter: Secondary | ICD-10-CM | POA: Diagnosis present

## 2020-09-29 DIAGNOSIS — E669 Obesity, unspecified: Secondary | ICD-10-CM | POA: Diagnosis present

## 2020-09-29 DIAGNOSIS — W010XXA Fall on same level from slipping, tripping and stumbling without subsequent striking against object, initial encounter: Secondary | ICD-10-CM | POA: Diagnosis present

## 2020-09-29 DIAGNOSIS — I2510 Atherosclerotic heart disease of native coronary artery without angina pectoris: Secondary | ICD-10-CM | POA: Diagnosis not present

## 2020-09-29 DIAGNOSIS — Z87891 Personal history of nicotine dependence: Secondary | ICD-10-CM | POA: Diagnosis not present

## 2020-09-29 LAB — BASIC METABOLIC PANEL
Anion gap: 7 (ref 5–15)
BUN: 13 mg/dL (ref 8–23)
CO2: 26 mmol/L (ref 22–32)
Calcium: 8.9 mg/dL (ref 8.9–10.3)
Chloride: 99 mmol/L (ref 98–111)
Creatinine, Ser: 0.57 mg/dL — ABNORMAL LOW (ref 0.61–1.24)
GFR, Estimated: >60 mL/min (ref >60)
Glucose, Bld: 92 mg/dL (ref 70–99)
Potassium: 4 mmol/L (ref 3.5–5.1)
Sodium: 132 mmol/L — ABNORMAL LOW (ref 135–145)

## 2020-09-29 LAB — CBC
HCT: 43.5 % (ref 39.0–52.0)
Hemoglobin: 14.5 g/dL (ref 13.0–17.0)
MCH: 31.4 pg (ref 26.0–34.0)
MCHC: 33.3 g/dL (ref 30.0–36.0)
MCV: 94.2 fL (ref 80.0–100.0)
Platelets: 222 10*3/uL (ref 150–400)
RBC: 4.62 MIL/uL (ref 4.22–5.81)
RDW: 13 % (ref 11.5–15.5)
WBC: 6.9 10*3/uL (ref 4.0–10.5)
nRBC: 0 % (ref 0.0–0.2)

## 2020-09-29 MED ORDER — GADOBUTROL 1 MMOL/ML IV SOLN
8.0000 mL | Freq: Once | INTRAVENOUS | Status: AC | PRN
  Administered 2020-09-29: 8 mL via INTRAVENOUS

## 2020-09-29 MED ORDER — TIOTROPIUM BROMIDE MONOHYDRATE 18 MCG IN CAPS: 18.0000 ug | ORAL_CAPSULE | Freq: Every day | RESPIRATORY_TRACT | Status: DC

## 2020-09-29 MED ORDER — MONTELUKAST SODIUM 10 MG PO TABS: 10.0000 mg | ORAL_TABLET | Freq: Every day | ORAL | Status: DC

## 2020-09-29 MED ORDER — PRAVASTATIN SODIUM 40 MG PO TABS
20.0000 mg | ORAL_TABLET | Freq: Every day | ORAL | Status: DC
  Administered 2020-09-29 – 2020-10-03 (×5): 20 mg via ORAL
  Filled 2020-09-29 (×5): qty 1

## 2020-09-29 MED ORDER — TERAZOSIN HCL 5 MG PO CAPS: 5.0000 mg | ORAL_CAPSULE | Freq: Every day | ORAL | Status: DC

## 2020-09-29 MED ORDER — PRAVASTATIN SODIUM 40 MG PO TABS: 20.0000 mg | ORAL_TABLET | Freq: Every day | ORAL | Status: DC

## 2020-09-29 MED ORDER — IOHEXOL 350 MG/ML SOLN
75.0000 mL | Freq: Once | INTRAVENOUS | Status: AC | PRN
  Administered 2020-09-29: 75 mL via INTRAVENOUS

## 2020-09-29 NOTE — Progress Notes (Signed)
Patient ID: Samuel Barnes, male   DOB: 11-27-1950, 70 y.o.   MRN: [id number] Films reviewed. There remains no direct evidence of an AVM. There is a prominent vessel, the CTA sheds no light, nor is the MRI conclusive. 4 vessel angiography was ordered this AM, and is necessary for a diagnosis.

## 2020-09-29 NOTE — Progress Notes (Signed)
PROGRESS NOTE    Samuel Barnes [facility]  XQJ:[id number] DOB: 01/20/1950 DOA: 09/27/2020 PCP: [name], PA   Brief Narrative:  HPI: Samuel Barnes is a 70 y.o. male with medical history significant of chronic A. fib on Eliquis, hypertension, CAD, chronic diastolic CHF, COPD, BPH, presented with near syncope fall and intracranial bleed.   Patient was diagnosed with new onset of A. fib in July to August this year, has been followed by cardiology, rate controlled with commendation of amiodarone and metoprolol.  Daily, patient has experienced frequent episodes of near syncope in the mornings especially after taking his morning medication, particularly he himself attributed the near syncope episode just to 3 medications including metoprolol, amiodarone and Spiriva inhaler.  Last week, his cardiologist cut down his metoprolol from 100 mg daily to 75 mg daily:  50 mg in the morning and 25 mg in the evening.  Despite, he still experienced episodes of near syncope and yesterday morning, after taking all his morning medications, patient started can feel lightheaded and then fell backwards (he intentionally voided falling forward to prevent his head or neck hitting the glass tabletop in front of him).  As of consciousness, denied any numbness or weakness of any of the limbs.  Right now, patient denied any headache, no feeling nauseous or vomiting.  No chest pains or shortness of breath.   ED Course: Vital signs, rate controlled A. fib, blood pressure slightly increased, CT head showed 7 mm acute parenchymal bleed surrounding edema within the left occipital lobe.    Assessment & Plan:   Active Problems:   Intracranial bleed (HCC)  Near syncope/fall/acute intracranial parenchymal hemorrhage: MRI brain confirms acute left occipital intraparenchymal hemorrhage with surrounding edema.  No note from neurosurgery or neurology.  Called and discussed with Dr. Franky Macho of neurology who mentions that they  were never called by admitting hospitalist.  He reviewed the case and recommended CT angiogram which he is going to order himself and follow-up on that and based on the CT angiogram, he will decide whether he is going to see the patient for formal consultation.  I have requested him to at least drop a note if he is not planning to see the patient.  He also opined that this is likely hemorrhagic infarct and recommended consulting neurology.  I have consulted Dr. Wilford Corner.  Will defer further management to neurosurgery and neurology.  Patient at this point in time has no neurological symptoms and he is hemodynamically stable.  Continue to hold Eliquis.     Recurrent near syncope: Suspected symptomatic bradycardia prior to admission.  Metoprolol was further cut down to 25 mg p.o. twice daily.   Permanent atrial fibrillation with intermittent slow heart rate: Remains in atrial fibrillation with heart rate around 60s now.  Did have some dips in 50s.  Metoprolol decreased.  Continue amiodarone.  Eliquis on hold.  Monitor closely.   COPD: No exacerbation. -At baseline.  Continue home medications.  Resume montelukast.  Essential hypertension: Fairly controlled.  Resume Hytrin.  Continue as needed hydralazine.  Hyperlipidemia: Resume pravastatin.   Chronic diastolic CHF -Euvolemic, adjust metoprolol dose.   DVT prophylaxis: SCDs Start: 09/28/20 1553 Place and maintain sequential compression device Start: 09/28/20 1531   Code Status: Full Code  Family Communication:  None present at bedside.  Plan of care discussed with patient in length and he verbalized understanding and agreed with it.  Status is: Observation  The patient will require care spanning > 2 midnights and  should be moved to inpatient because: Inpatient level of care appropriate due to severity of illness  Dispo: The patient is from: Home              Anticipated d/c is to: Home              Patient currently is not medically stable to  d/c.   Difficult to place patient No        Estimated body mass index is 28.21 kg/m as calculated from the following:   Height as of this encounter: 5\' 9"  (1.753 m).   Weight as of this encounter: 86.6 kg.     Nutritional Assessment: Body mass index is 28.21 kg/m. Seen by dietician.  I agree with the assessment and plan as outlined below: Nutrition Status:        .  Skin Assessment: I have examined the patient's skin and I agree with the wound assessment as performed by the wound care RN as outlined below:    Consultants:  Neurosurgery Neurology  Procedures:  None  Antimicrobials:  Anti-infectives (From admission, onward)    None          Subjective: Seen and examined.  He has no complaints.  No more dizziness.  No any other complaint.  Objective: Vitals:   09/28/20 2025 09/29/20 0028 09/29/20 0432 09/29/20 1100  BP: 124/78 115/78 (!) 144/84 (!) 145/87  Pulse: 64 (!) 54 (!) 58 61  Resp: 18 18 18 16   Temp: 97.6 F (36.4 C) 98.2 F (36.8 C) 97.7 F (36.5 C)   TempSrc: Oral Oral Oral   SpO2: 94% 94% 95% 93%  Weight:      Height:        Intake/Output Summary (Last 24 hours) at 09/29/2020 1141 Last data filed at 09/28/2020 1400 Gross per 24 hour  Intake --  Output 200 ml  Net -200 ml   Filed Weights   09/27/20 1818  Weight: 86.6 kg    Examination:  General exam: Appears calm and comfortable  Respiratory system: Clear to auscultation. Respiratory effort normal. Cardiovascular system: S1 & S2 heard, RRR. No JVD, murmurs, rubs, gallops or clicks. No pedal edema. Gastrointestinal system: Abdomen is nondistended, soft and nontender. No organomegaly or masses felt. Normal bowel sounds heard. Central nervous system: Alert and oriented. No focal neurological deficits. Extremities: Symmetric 5 x 5 power. Skin: No rashes, lesions or ulcers Psychiatry: Judgement and insight appear normal. Mood & affect appropriate.    Data Reviewed: I have  personally reviewed following labs and imaging studies  CBC: Recent Labs  Lab 09/27/20 1813  WBC 7.2  NEUTROABS 4.8  HGB 14.7  HCT 43.8  MCV 93.6  PLT 241   Basic Metabolic Panel: Recent Labs  Lab 09/27/20 1813  NA 134*  K 4.0  CL 101  CO2 27  GLUCOSE 83  BUN 16  CREATININE 0.70  CALCIUM 8.9   GFR: Estimated Creatinine Clearance: 92.4 mL/min (by C-G formula based on SCr of 0.7 mg/dL). Liver Function Tests: Recent Labs  Lab 09/27/20 1813  AST 27  ALT 31  ALKPHOS 107  BILITOT 0.4  PROT 6.8  ALBUMIN 3.6   No results for input(s): LIPASE, AMYLASE in the last 168 hours. No results for input(s): AMMONIA in the last 168 hours. Coagulation Profile: Recent Labs  Lab 09/27/20 1813  INR 1.3*   Cardiac Enzymes: No results for input(s): CKTOTAL, CKMB, CKMBINDEX, TROPONINI in the last 168 hours. BNP (last 3 results) No  results for input(s): PROBNP in the last 8760 hours. HbA1C: No results for input(s): HGBA1C in the last 72 hours. CBG: No results for input(s): GLUCAP in the last 168 hours. Lipid Profile: No results for input(s): CHOL, HDL, LDLCALC, TRIG, CHOLHDL, LDLDIRECT in the last 72 hours. Thyroid Function Tests: No results for input(s): TSH, T4TOTAL, FREET4, T3FREE, THYROIDAB in the last 72 hours. Anemia Panel: No results for input(s): VITAMINB12, FOLATE, FERRITIN, TIBC, IRON, RETICCTPCT in the last 72 hours. Sepsis Labs: No results for input(s): PROCALCITON, LATICACIDVEN in the last 168 hours.  Recent Results (from the past 240 hour(s))  SARS Coronavirus 2 (TAT 6-24 hrs)     Status: None   Collection Time: 09/25/20 12:00 AM  Result Value Ref Range Status   SARS Coronavirus 2 RESULT: NEGATIVE  Final    Comment: RESULT: NEGATIVESARS-CoV-2 INTERPRETATION:A NEGATIVE  test result means that SARS-CoV-2 RNA was not present in the specimen above the limit of detection of this test. This does not preclude a possible SARS-CoV-2 infection and should not be used as  the  sole basis for patient management decisions. Negative results must be combined with clinical observations, patient history, and epidemiological information. Optimum specimen types and timing for peak viral levels during infections caused by SARS-CoV-2  have not been determined. Collection of multiple specimens or types of specimens may be necessary to detect virus. Improper specimen collection and handling, sequence variability under primers/probes, or organism present below the limit of detection may  lead to false negative results. Positive and negative predictive values of testing are highly dependent on prevalence. False negative test results are more likely when prevalence of disease is high.The expected result is NEGATIVE.Fact S heet for  Healthcare Providers: CollegeCustoms.gl Sheet for Patients: https://poole-freeman.org/ Reference Range - Negative   Resp Panel by RT-PCR (Flu A&B, Covid) Nasopharyngeal Swab     Status: None   Collection Time: 09/27/20  6:13 PM   Specimen: Nasopharyngeal Swab; Nasopharyngeal(NP) swabs in vial transport medium  Result Value Ref Range Status   SARS Coronavirus 2 by RT PCR NEGATIVE NEGATIVE Final    Comment: (NOTE) SARS-CoV-2 target nucleic acids are NOT DETECTED.  The SARS-CoV-2 RNA is generally detectable in upper respiratory specimens during the acute phase of infection. The lowest concentration of SARS-CoV-2 viral copies this assay can detect is 138 copies/mL. A negative result does not preclude SARS-Cov-2 infection and should not be used as the sole basis for treatment or other patient management decisions. A negative result may occur with  improper specimen collection/handling, submission of specimen other than nasopharyngeal swab, presence of viral mutation(s) within the areas targeted by this assay, and inadequate number of viral copies(<138 copies/mL). A negative result must be combined  with clinical observations, patient history, and epidemiological information. The expected result is Negative.  Fact Sheet for Patients:  BloggerCourse.com  Fact Sheet for Healthcare Providers:  SeriousBroker.it  This test is no t yet approved or cleared by the [place] FDA and  has been authorized for detection and/or diagnosis of SARS-CoV-2 by FDA under an Emergency Use Authorization (EUA). This EUA will remain  in effect (meaning this test can be used) for the duration of the COVID-19 declaration under Section 564(b)(1) of the Act, 21 U.S.C.section 360bbb-3(b)(1), unless the authorization is terminated  or revoked sooner.       Influenza A by PCR NEGATIVE NEGATIVE Final   Influenza B by PCR NEGATIVE NEGATIVE Final    Comment: (NOTE) The Xpert Xpress SARS-CoV-2/FLU/RSV plus assay is  intended as an aid in the diagnosis of influenza from Nasopharyngeal swab specimens and should not be used as a sole basis for treatment. Nasal washings and aspirates are unacceptable for Xpert Xpress SARS-CoV-2/FLU/RSV testing.  Fact Sheet for Patients: BloggerCourse.com  Fact Sheet for Healthcare Providers: SeriousBroker.it  This test is not yet approved or cleared by the [place] FDA and has been authorized for detection and/or diagnosis of SARS-CoV-2 by FDA under an Emergency Use Authorization (EUA). This EUA will remain in effect (meaning this test can be used) for the duration of the COVID-19 declaration under Section 564(b)(1) of the Act, 21 U.S.C. section 360bbb-3(b)(1), unless the authorization is terminated or revoked.  Performed at [facility] [address], [place], [place] [address]       Radiology Studies: CT HEAD WO CONTRAST ( )  Result Date: 09/27/2020 CLINICAL DATA:  Status post trauma. EXAM: CT HEAD WITHOUT CONTRAST TECHNIQUE: Contiguous axial  images were obtained from the base of the skull through the vertex without intravenous contrast. COMPARISON:  August 14, 2020 FINDINGS: Brain: There is mild cerebral atrophy with widening of the extra-axial spaces and ventricular dilatation. There are areas of decreased attenuation within the white matter tracts of the supratentorial brain, consistent with microvascular disease changes. A 7 mm hyperdense focus is seen within the subcortical white matter of the left occipital lobe. A mild amount of surrounding white matter low attenuation is seen. A mild amount of mass effect is noted on the adjacent sulci. There is no evidence of midline shift. Vascular: No hyperdense vessel or unexpected calcification. Skull: Normal. Negative for fracture or focal lesion. Sinuses/Orbits: Very mild bilateral ethmoid sinus mucosal thickening is seen. Other: None. IMPRESSION: 1. 7 mm acute parenchymal bleed and surrounding edema within the left occipital lobe. MRI correlation is recommended. 2. Generalized cerebral atrophy. Electronically Signed   By: Aram Candela M.D.   On: 09/27/2020 17:52   MR BRAIN WO CONTRAST  Result Date: 09/28/2020 EXAM: MRI HEAD WITHOUT CONTRAST TECHNIQUE: Multiplanar, multiecho pulse sequences of the brain and surrounding structures were obtained without intravenous contrast. COMPARISON:  CT head 09/27/2020.  MRI 08/16/2020. FINDINGS: Brain: Acute hemorrhage in the left occipital lobe with associated T1 hyperintensity and susceptibility artifact. When comparing across modalities, the size of the hemorrhage is likely similar. There is surrounding edema without significant mass effect. No surrounding restricted diffusion to suggest acute infarct. In the region of hemorrhage there is a tubular T2 hypointense flow void, compatible with prominent vessel that courses anterolaterally towards the left lateral ventricle (for example, see series 5, image 12). No midline shift, hydrocephalus, or extra-axial  fluid collection. Additional mild scattered T2 hyperintensities within the white matter, nonspecific but compatible with chronic microvascular ischemic disease. Vascular: Major arterial flow voids are maintained at the skull base. Flow void in the region of the left occipital hemorrhage is detailed above. Skull and upper cervical spine: Normal marrow signal. Sinuses/Orbits: Clear sinuses.  No acute orbital findings. Other: No mastoid effusions. IMPRESSION: 1. Acute left occipital intraparenchymal hemorrhage with surrounding edema. There is a prominent vessel in this region, which courses towards the left lateral ventricle. Recommend postcontrast MRI and CTA to further evaluate and to exclude a vascular malformation as a cause of the hemorrhage. 2. No surrounding restricted diffusion to suggest acute infarct. Electronically Signed   By: Feliberto Harts M.D.   On: 09/28/2020 13:48   DG Knee Complete 4 Views Left  Result Date: 09/27/2020 CLINICAL DATA:  Status post  fall. EXAM: LEFT KNEE - COMPLETE 4+ VIEW COMPARISON:  None. FINDINGS: No evidence of fracture, dislocation, or joint effusion. Mild medial and lateral tibiofemoral compartment space narrowing is seen. Moderate to marked severity vascular calcification is seen. IMPRESSION: 1. No acute osseous abnormality. Electronically Signed   By: Aram Candela M.D.   On: 09/27/2020 17:54    Scheduled Meds:  amiodarone  200 mg Oral q AM   FLUoxetine  20 mg Oral QHS   melatonin  3 mg Oral QHS   metoprolol tartrate  25 mg Oral BID   montelukast  10 mg Oral QHS   pravastatin  20 mg Oral q1800   terazosin  5 mg Oral Daily   thiamine  100 mg Oral Daily   umeclidinium bromide  1 puff Inhalation Daily   Continuous Infusions:   LOS: 0 days   Time spent: 40 minutes   Hughie Closs, MD [facility]  09/29/2020, 11:41 AM  Please page via Loretha Stapler and do not message via secure chat for anything urgent. Secure chat can be used for anything non  urgent.  How to contact the [facility] Attending or Consulting provider 7A - 7P or covering provider during after hours 7P -7A, for this patient?  Check the care team in [facility] and look for a) attending/consulting [facility] provider listed and b) the [facility] team listed. Page or secure chat 7A-7P. Log into [email or web address] and use [facility] universal password to access. If you do not have the password, please contact the hospital operator. Locate the [facility] provider you are looking for under [facility] and page to a number that you can be directly reached. If you still have difficulty reaching the provider, please page the [facility] (Director on Call) for the Hospitalists listed on amion for assistance.

## 2020-09-29 NOTE — Progress Notes (Signed)
Received a call from Dr. Wilford Corner about CT angiogram of the head as well as MRI results.  Per him, neurosurgery will need to see patient and possibly do arteriogram or some sort of procedure.  I called and spoke to Dr. Franky Macho and informed him about patient's MRI as well as CT angiogram of the head results.  He mentioned that he was heading to [facility] long to see a patient but he will see patient himself soon.  He was thinking about possibly transferring the patient to ICU but asked me not to transfer him until he sees patient.  Neurosurgery to likely take over this patient from hospitalist once Dr. Franky Macho sees this patient.  Management per neurosurgery.

## 2020-09-29 NOTE — Consult Note (Signed)
Reason for Consult:possible AVM Referring Physician: [name] is an 70 y.o. male.  HPI: whom was admitted on 9/22 for a syncopal episode, and a 68mm area of high signal on CT. Mri revealed and confirmed a small ICH. Samuel Barnes is neurologically normal, and has been since admission. The syncope is not a new symptom and has been occurring over the last year. I was told Samuel Barnes had an avm. Unfortunately there has not been an appropriate diagnostic test performed.. Nor is this lesion need emergent, or urgent treatment.   Past Medical History:  Diagnosis Date   Aortic atherosclerosis (HCC) 07/28/2020   BPH (benign prostatic hyperplasia)    Class 2 obesity 07/28/2020   Coronary artery calcification 07/28/2020   Depression    Family history of adverse reaction to anesthesia    Hyperlipidemia 07/28/2020   Hypertension    Tobacco use 07/28/2020    Past Surgical History:  Procedure Laterality Date   CARDIOVERSION N/A 08/02/2020   Procedure: CARDIOVERSION;  Surgeon: Meriam Sprague, MD;  Location: [facility] ENDOSCOPY;  Service: Cardiovascular;  Laterality: N/A;   IR THORACENTESIS ASP PLEURAL SPACE W/IMG GUIDE  07/31/2020   TEE WITHOUT CARDIOVERSION N/A 08/02/2020   Procedure: TRANSESOPHAGEAL ECHOCARDIOGRAM (TEE);  Surgeon: Meriam Sprague, MD;  Location: [facility] ENDOSCOPY;  Service: Cardiovascular;  Laterality: N/A;   TRANSURETHRAL RESECTION OF PROSTATE      Family History  Problem Relation Age of Onset   Coronary artery disease Mother    Hypertension Mother    Hyperlipidemia Mother    Ovarian cancer Mother    Ovarian cancer Sister    Liver cancer Sister    Liver cancer Brother     Social History:  reports that he quit smoking about 6 weeks ago. His smoking use included cigarettes. He started smoking about 53 years ago. He has never used smokeless tobacco. He reports that he does not currently use alcohol after a past usage of about 1.0 standard drink per  week. He reports that he does not use drugs.  Allergies:  Allergies  Allergen Reactions   Ciprofloxacin Shortness Of Breath and Other (See Comments)    Numbness in extremities    Gabapentin Other (See Comments)    Mental problems, depression, anger    Medications: I have reviewed the patient's current medications.  Results for orders placed or performed during the hospital encounter of 09/27/20 (from the past 48 hour(s))  Basic metabolic panel     Status: Abnormal   Collection Time: 09/29/20 12:48 PM  Result Value Ref Range   Sodium 132 (L) 135 - 145 mmol/L   Potassium 4.0 3.5 - 5.1 mmol/L   Chloride 99 98 - 111 mmol/L   CO2 26 22 - 32 mmol/L   Glucose, Bld 92 70 - 99 mg/dL    Comment: Glucose reference range applies only to samples taken after fasting for at least 8 hours.   BUN 13 8 - 23 mg/dL   Creatinine, Ser 1.82 (L) 0.61 - 1.24 mg/dL   Calcium 8.9 8.9 - 99.3 mg/dL   GFR, Estimated >71 >69 mL/min    Comment: (NOTE) Calculated using the CKD-EPI Creatinine Equation (2021)    Anion gap 7 5 - 15    Comment: Performed at [facility], [address], [place], [place] [address]  CBC     Status: None   Collection Time: 09/29/20 12:48 PM  Result Value Ref Range   WBC 6.9 4.0 - 10.5 K/uL  RBC 4.62 4.22 - 5.81 MIL/uL   Hemoglobin 14.5 13.0 - 17.0 g/dL   HCT 64.4 03.4 - 74.2 %   MCV 94.2 80.0 - 100.0 fL   MCH 31.4 26.0 - 34.0 pg   MCHC 33.3 30.0 - 36.0 g/dL   RDW 59.5 63.8 - 75.6 %   Platelets 222 150 - 400 K/uL   nRBC 0.0 0.0 - 0.2 %    Comment: Performed at [facility], [address] [place], [place] [address]    CT ANGIO HEAD NECK W WO CM  Result Date: 09/29/2020 CLINICAL DATA:  Stroke, follow-up EXAM: CT ANGIOGRAPHY HEAD AND NECK TECHNIQUE: Multidetector CT imaging of the head and neck was performed using the standard protocol during bolus administration of intravenous contrast. Multiplanar CT image reconstructions and MIPs were obtained to evaluate  the vascular anatomy. Carotid stenosis measurements (when applicable) are obtained utilizing NASCET criteria, using the distal internal carotid diameter as the denominator. CONTRAST:  14mL OMNIPAQUE IOHEXOL 350 MG/ML SOLN COMPARISON:  Recent CT and MR imaging FINDINGS: CT HEAD Brain: Small area of recent hemorrhage is again identified in the left occipital lobe. Slightly increased adjacent edema. No significant mass effect. No new hemorrhage. Vascular: There is atherosclerotic calcification at the skull base. Skull: Calvarium is unremarkable. Sinuses/Orbits: No acute finding. Other: None. Review of the MIP images confirms the above findings CTA NECK Aortic arch: Mixed plaque along the arch and great vessel origins. Right carotid system: Patent. Mixed plaque along the common carotid causing nearly 50% stenosis. Calcified plaque along the proximal internal carotid causing less than 50% stenosis. Left carotid system: Patent. Mixed but primarily calcified plaque along the common carotid causing less than 50% stenosis. Heavy calcified plaque at the bifurcation causing 65% stenosis. Calcified plaque along the proximal internal carotid causing less than 50% stenosis. Vertebral arteries: Patent. Right vertebral artery slightly dominant. Calcified plaque at the right vertebral origin causing at least moderate stenosis. Skeleton: Degenerative changes of the included spine. Other neck: Unremarkable. Upper chest: Right pleural effusion. Review of the MIP images confirms the above findings CTA HEAD Anterior circulation: Intracranial internal carotid arteries are patent with calcified plaque causing mild stenosis. Anterior and middle cerebral arteries are patent. Posterior circulation: Intracranial vertebral arteries are patent with mild calcified plaque on the left. Basilar artery is patent. Major cerebellar artery origins are patent. Posterior cerebral arteries are patent. Left posterior communicating artery is the primary  supply of the left PCA. Venous sinuses: Patent as allowed by contrast bolus timing. There is a prominent draining vein in the left occipital lobe in proximity to the area of hemorrhage. There is no tangle of vessels to suggest a nidus. Review of the MIP images confirms the above findings IMPRESSION: Similar small area of recent hemorrhage in the left occipital lobe with slightly increased surrounding edema. Prominent draining vein in the left occipital lobe in proximity area of hemorrhage. No evidence of a nidus. 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 Otherwise, NIR consultation suggested to evaluate utility of catheter angiography. Plaque along the right common carotid causing nearly 50% stenosis. Heavily calcified plaque just below the left common carotid bifurcation causing 65% stenosis. Less than 50% stenosis along the proximal internal carotids. Calcified plaque at the right vertebral origin causing at least moderate stenosis. Electronically Signed   By: Guadlupe Spanish M.D.   On: 09/29/2020 14:49   MR BRAIN WO CONTRAST  Result Date: 09/28/2020  EXAM: MRI HEAD WITHOUT CONTRAST TECHNIQUE: Multiplanar, multiecho pulse sequences of the brain and surrounding structures were obtained without intravenous contrast. COMPARISON:  CT head 09/27/2020.  MRI 08/16/2020. FINDINGS: Brain: Acute hemorrhage in the left occipital lobe with associated T1 hyperintensity and susceptibility artifact. When comparing across modalities, the size of the hemorrhage is likely similar. There is surrounding edema without significant mass effect. No surrounding restricted diffusion to suggest acute infarct. In the region of hemorrhage there is a tubular T2 hypointense flow void, compatible with prominent vessel that courses anterolaterally towards the left lateral ventricle (for example, see series 5, image 12). No midline shift, hydrocephalus, or extra-axial  fluid collection. Additional mild scattered T2 hyperintensities within the white matter, nonspecific but compatible with chronic microvascular ischemic disease. Vascular: Major arterial flow voids are maintained at the skull base. Flow void in the region of the left occipital hemorrhage is detailed above. Skull and upper cervical spine: Normal marrow signal. Sinuses/Orbits: Clear sinuses.  No acute orbital findings. Other: No mastoid effusions. IMPRESSION: 1. Acute left occipital intraparenchymal hemorrhage with surrounding edema. There is a prominent vessel in this region, which courses towards the left lateral ventricle. Recommend postcontrast MRI and CTA to further evaluate and to exclude a vascular malformation as a cause of the hemorrhage. 2. No surrounding restricted diffusion to suggest acute infarct. Electronically Signed   By: Feliberto Harts M.D.   On: 09/28/2020 13:48   MR BRAIN W CONTRAST  Result Date: 09/29/2020 CLINICAL DATA:  Neuro deficit, acute, stroke suspected. EXAM: MRI HEAD WITH CONTRAST TECHNIQUE: Multiplanar, multiecho pulse sequences of the brain and surrounding structures were obtained with intravenous contrast. CONTRAST:  38mL GADAVIST GADOBUTROL 1 MMOL/ML IV SOLN COMPARISON:  CT angiogram head/neck 09/29/2020. Brain MRI 09/28/2020. Noncontrast head CT 09/27/2020. FINDINGS: Brain: A contrast-enhanced brain MRI was performed as a follow-up to the recent noncontrast brain MRI of 09/28/2020. On today's examination, the following sequences were acquired: Axial precontrast T1 weighted sequence, axial postcontrast T1 weighted sequence, coronal T1 weighted postcontrast sequence. The coronal T1 weighted postcontrast sequence is mildly motion degraded. Grossly unchanged size of a small focus of recent parenchymal hemorrhage within the left occipital lobe. Adjacent parenchymal edema also does not appear significantly changed. Redemonstrated abnormal prominent venous vessel within the left  occipital region coursing in close proximity to the parenchymal hemorrhage. There is an associated 7 mm rounded focus of enhancement along the course of this vessel, immediately adjacent to the parenchymal hemorrhage, suspicious for a venous aneurysm (series 8, image 23). No abnormal intracranial enhancement is identified elsewhere. Vascular: Enhancement within the proximal large arterial vessels. Abnormal prominent venous vessel in the left occipital region, as described above. Skull and upper cervical spine: No appreciable focal suspicious marrow lesion. Sinuses/Orbits: Mild bilateral ethmoid sinus mucosal thickening. IMPRESSION: Grossly unchanged small focus of recent parenchymal hemorrhage within the left occipital lobe with surrounding parenchymal edema. Redemonstrated abnormal prominent venous vessel within the left occipital region, coursing in close proximity to the parenchymal hemorrhage. This constellation of findings is highly suggestive of an underlying arteriovenous malformation (AVM). Additionally, there is a 7 mm rounded focus of enhancement along the course of the described abnormal vessel, immediately adjacent to the parenchymal hemorrhage, suspicious for a venous aneurysm. Catheter-based angiography is recommended for further evaluation. Electronically Signed   By: Jackey Loge D.O.   On: 09/29/2020 15:10    Review of Systems Blood pressure (!) 145/89, pulse 70, temperature 97.9 F (36.6 C), temperature source Oral, resp. rate 16, height 5'  9" (1.753 m), weight 86.6 kg, SpO2 98 %. Physical Exam Constitutional:      General: He is not in acute distress.    Appearance: Normal appearance. He is not toxic-appearing.  HENT:     Head: Normocephalic.     Nose: Nose normal.     Mouth/Throat:     Mouth: Mucous membranes are moist.     Pharynx: Oropharynx is clear.  Eyes:     Extraocular Movements: Extraocular movements intact.     Conjunctiva/sclera: Conjunctivae normal.     Pupils: Pupils  are equal, round, and reactive to light.  Pulmonary:     Effort: Pulmonary effort is normal.  Abdominal:     General: Abdomen is flat.  Musculoskeletal:        General: Normal range of motion.     Cervical back: Normal range of motion.  Skin:    General: Skin is warm and dry.  Neurological:     General: No focal deficit present.     Mental Status: He is alert and oriented to person, place, and time. Mental status is at baseline.     Cranial Nerves: Cranial nerves are intact. No cranial nerve deficit.     Sensory: Sensation is intact. No sensory deficit.     Motor: Motor function is intact. No weakness.     Coordination: Coordination is intact. Coordination normal.     Gait: Gait normal.     Deep Tendon Reflexes: Reflexes normal.     Comments: Speech is clear and fluent  Psychiatric:        Mood and Affect: Mood normal.        Behavior: Behavior normal.        Thought Content: Thought content normal.        Judgment: Judgment normal.    Assessment/Plan: CEASER EBELING is a 70 y.o. male With a very small hematoma. He has a strange appearing vessel. There is no nidus visible on any imaging. He needs a 4vessel angiogram as was ordered this morning. No diagnosis of an AVM can be made at this time.   [name] 09/29/2020, 7:21 PM

## 2020-09-29 NOTE — Progress Notes (Signed)
Patient ID: Samuel Barnes, male   DOB: 09-05-1950, 70 y.o.   MRN: [id number] BP (!) 145/87 (BP Location: Right Arm)   Pulse 61   Temp 97.7 F (36.5 C) (Oral)   Resp 16   Ht 5\' 9"  (1.753 m)   Wt 86.6 kg   SpO2 93%   BMI 28.21 kg/m  I have been called for evaluation of [name] and the occipital hemorrhage. The only evidence at this time is what the radiologist is describing as a large vessel. I have recommended and ordered a 4 vessel angiogram to assess the vasculature.

## 2020-09-29 NOTE — Plan of Care (Signed)
Called for possible ICH. Reviewed images. Likely a AVM or DVA Ordered CTA head - possible DVA read on CTA, delayed contrast phase CT images. MRI w+w/o pending Neurosurgery has been consulted and pt will need an arteriogram.  Further management per NSGY.  -- Milon Dikes, MD Neurologist [facility] Pager: [phone]

## 2020-09-29 NOTE — Evaluation (Signed)
Physical Therapy Evaluation Patient Details Name: Samuel Barnes MRN: [id number] DOB: Sep 17, 1950 Today's Date: 09/29/2020  History of Present Illness  70 y.o. male admitted on 09/27/20 after falling and hitting his head and dx with acute intracranial parenchymal hemorrhage: MRI brain confirms acute left occipital intraparenchymal hemorrhage with surrounding edema.  Pt with significant PMH of aortic atherosclerosis, HTN, COPD, A-fib on Eliquis, diastolic CHF.  Clinical Impression  Pt and daughter report he appears fairly close to his baseline (ambulating with RW), pt reports only change is his vision is a bit more blurry than usual (admitting it was not great to begin with), but he did not consistently hit any of the obstacles in his hallway or in his room.  He would benefit from acute therapy, but not follow up.  We will work on balance exercises and gait.   PT to follow acutely for deficits listed below.        Recommendations for follow up therapy are one component of a multi-disciplinary discharge planning process, led by the attending physician.  Recommendations may be updated based on patient status, additional functional criteria and insurance authorization.  Follow Up Recommendations No PT follow up;Supervision - Intermittent    Equipment Recommendations  None recommended by PT    Recommendations for Other Services       Precautions / Restrictions Precautions Precautions: Fall Precaution Comments: h/o falls/syncopal events      Mobility  Bed Mobility Overal bed mobility: Needs Assistance Bed Mobility: Supine to Sit;Sit to Supine     Supine to sit: Modified independent (Device/Increase time) Sit to supine: Modified independent (Device/Increase time)        Transfers Overall transfer level: Needs assistance Equipment used: Rolling walker (2 wheeled) Transfers: Sit to/from Stand Sit to Stand: Min guard         General transfer comment: min guard assist for  safety cues for safe hand placement.  Ambulation/Gait Ambulation/Gait assistance: Min guard;Min assist Gait Distance (Feet): 150 Feet Assistive device: Rolling walker (2 wheeled);1 person hand held assist Gait Pattern/deviations: Step-through pattern;Antalgic;Staggering right;Staggering left     General Gait Details: Pt min assist walking with hand held assist to the bathroom, min guard in hallway with RW, staggers when he looks at me, turning his head to talk.  Ran into something on his left side, but only once.  Stairs            Wheelchair Mobility    Modified Rankin (Stroke Patients Only)       Balance Overall balance assessment: Mild deficits observed, not formally tested                                           Pertinent Vitals/Pain Pain Assessment: No/denies pain    Home Living Family/patient expects to be discharged to:: Private residence Living Arrangements: Alone Available Help at Discharge: Family;Available PRN/intermittently (daughter lives 10 mins away) Type of Home: House Home Access: Stairs to enter     Home Layout: One level Home Equipment: Environmental consultant - 2 wheels;Shower seat      Prior Function Level of Independence: Independent with assistive device(s)         Comments: reports he walks with RW at home, but also reports he gets his newspaper at 2 am with a flashlight, so I am not sure of the consistency of use.     Hand Dominance  Dominant Hand: Right    Extremity/Trunk Assessment   Upper Extremity Assessment Upper Extremity Assessment: Overall WFL for tasks assessed    Lower Extremity Assessment Lower Extremity Assessment: Overall WFL for tasks assessed    Cervical / Trunk Assessment Cervical / Trunk Assessment: Normal  Communication   Communication: No difficulties  Cognition Arousal/Alertness: Awake/alert Behavior During Therapy: WFL for tasks assessed/performed Overall Cognitive Status: History of cognitive  impairments - at baseline                                 General Comments: per daughter some dementia, but mild.  Currently at his baseline.      General Comments General comments (skin integrity, edema, etc.): HR in the 60s O2 sats in the mid to upper 90s on RA throughout session.    Exercises     Assessment/Plan    PT Assessment Patient needs continued PT services  PT Problem List Decreased balance;Decreased knowledge of use of DME       PT Treatment Interventions DME instruction;Gait training;Stair training;Functional mobility training;Therapeutic exercise;Therapeutic activities;Balance training;Patient/family education    PT Goals (Current goals can be found in the Care Plan section)  Acute Rehab PT Goals Patient Stated Goal: to go home PT Goal Formulation: With patient/family Time For Goal Achievement: 10/13/20 Potential to Achieve Goals: Good    Frequency Min 3X/week   Barriers to discharge        Co-evaluation               AM-PAC PT "6 Clicks" Mobility  Outcome Measure Help needed turning from your back to your side while in a flat bed without using bedrails?: A Little Help needed moving from lying on your back to sitting on the side of a flat bed without using bedrails?: A Little Help needed moving to and from a bed to a chair (including a wheelchair)?: A Little Help needed standing up from a chair using your arms (e.g., wheelchair or bedside chair)?: A Little Help needed to walk in hospital room?: A Little Help needed climbing 3-5 steps with a railing? : A Little 6 Click Score: 18    End of Session   Activity Tolerance: Patient tolerated treatment well Patient left: in bed;with call bell/phone within reach;with family/visitor present   PT Visit Diagnosis: Difficulty in walking, not elsewhere classified (R26.2)    Time: 4935-5217 PT Time Calculation (min) (ACUTE ONLY): 34 min   Charges:   PT Evaluation $PT Eval Moderate  Complexity: 1 Mod PT Treatments $Gait Training: 8-22 mins        Corinna Capra, PT, DPT  Acute Rehabilitation Ortho Tech Supervisor [phone] pager [phone]) [phone] office

## 2020-09-30 DIAGNOSIS — I2510 Atherosclerotic heart disease of native coronary artery without angina pectoris: Secondary | ICD-10-CM

## 2020-09-30 DIAGNOSIS — J449 Chronic obstructive pulmonary disease, unspecified: Secondary | ICD-10-CM

## 2020-09-30 DIAGNOSIS — I629 Nontraumatic intracranial hemorrhage, unspecified: Secondary | ICD-10-CM | POA: Diagnosis not present

## 2020-09-30 DIAGNOSIS — Q282 Arteriovenous malformation of cerebral vessels: Secondary | ICD-10-CM

## 2020-09-30 DIAGNOSIS — I6523 Occlusion and stenosis of bilateral carotid arteries: Secondary | ICD-10-CM | POA: Diagnosis not present

## 2020-09-30 DIAGNOSIS — I4819 Other persistent atrial fibrillation: Secondary | ICD-10-CM

## 2020-09-30 DIAGNOSIS — R55 Syncope and collapse: Secondary | ICD-10-CM

## 2020-09-30 NOTE — Progress Notes (Addendum)
Attempted to start PIV in right hand, patient reports being a very difficult IV stick. Unsuccessful with PIV insertion. Patient with IR arteriogram planned for Monday 10/02/2020. IV team consult order placed for assistance with obtaining new PIV.   1615: Spoke with IV Team/VAS team nurse Barkley Bruns via Epic Secure Chat who stated it would be best for patient to have PIV inserted on Sunday 10/01/2020 PM closer to IR procedure. Clarified with Misty Stanley RN that RN caring for patient on 09/25 PM will need to place a new IV team consult order.   Patient with no IV access currently per IV team recommendations, as patient is not receiving any IV medications.

## 2020-09-30 NOTE — Progress Notes (Addendum)
PROGRESS NOTE  Samuel Virden [facility] QMV:[id number] DOB: October 22, 1950   PCP: [name], PA  Patient is from: Home.  Independent at baseline.  DOA: 09/27/2020 LOS: 1  Chief complaints:  Chief Complaint  Patient presents with   Head Injury     Brief Narrative / Interim history: 70 year old M with PMH of chronic A. fib on Eliquis, HTN, CAD, diastolic CHF, COPD and BPH presenting with near syncope and fall, and admitted for near syncope and ICH.  Diagnosed with A. fib this summer and started on amiodarone, metoprolol and Eliquis.  Has been feeling lightheaded since he started taking those medications.  As a result, his metoprolol was decreased to 50 mg in the morning and 25 mg in the evening by his cardiologist but his lightheadedness did not improve and he had a fall backward with his neck eating glass tabletop without LOC.  CT head without contrast showed about 7 mm acute parenchymal bleed with surrounding edema within the left occipital lobe.  The next day, neurology and neurosurgery consulted.  MRI brain without contrast confirmed acute left occipital intraparenchymal hemorrhage.  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  MRI brain with contrast "highly suggestive for AVM and 7 mm rounded focus of enhancement suspicious for venous aneurysm".  Catheter-based angiography was recommended and ordered.    Subjective: Seen and examined earlier this morning.  No major events overnight of this morning.  No complaints.  Had mild headache earlier this morning that has resolved with Tylenol.  He reports poor vision but no acute change.  He denies focal numbness, tingling, weakness, chest pain, dyspnea, GI or UTI symptoms.  Denies dizziness lying down.  Objective: Vitals:   09/30/20 0850  09/30/20 1013 09/30/20 1345 09/30/20 1623  BP:  (!) 160/82 (!) 156/91 128/78  Pulse:  60 63 77  Resp:    20  Temp:   98.4 F (36.9 C) 97.6 F (36.4 C)  TempSrc:   Oral Oral  SpO2: 97% 98% 99% 96%  Weight:      Height:        Intake/Output Summary (Last 24 hours) at 09/30/2020 1806 Last data filed at 09/30/2020 2841 Gross per 24 hour  Intake 360 ml  Output 380 ml  Net -20 ml   Filed Weights   09/27/20 1818  Weight: 86.6 kg    Examination:  GENERAL: No apparent distress.  Nontoxic. HEENT: MMM.  Vision and hearing grossly intact.  NECK: Supple.  No apparent JVD.  RESP:  No IWOB.  Fair aeration bilaterally. CVS:  RRR. Heart sounds normal.  ABD/GI/GU: BS+. Abd soft, NTND.  MSK/EXT:  Moves extremities. No apparent deformity. No edema.  SKIN: no apparent skin lesion or wound NEURO: Awake, alert and oriented appropriately.  No apparent focal neuro deficit. PSYCH: Calm. Normal affect.   Procedures:  None  Microbiology summarized: COVID-19 and influenza PCR nonreactive.  Assessment & Plan: Near syncope/fall-temporal association with initiation of metoprolol for PAF in the setting of his underlying carotid artery stenosis suggest symptomatic carotid artery stenosis. -IR has been consulted for angiography -Metoprolol reduced to 25 mg twice daily -Fall precaution  Acute intracranial parenchymal hemorrhage-likely from the above.  Patient is on Eliquis for A. fib. -Continue holding Eliquis -Four-vessel IR angiography requested by neurosurgery.  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 Fib-currently rate controlled. -Continue amiodarone and reduced dose of metoprolol -Eliquis on hold in the setting of ICH  Chronic diastolic CHF: TTE 07/2020 with LVEF of 55 to 60%, RVSP of 36.5.  Not on diuretics at home.  Appears euvolemic on exam. -Monitor fluid status  History of CAD?  No anginal symptoms. -Continue  statin  Essential hypertension: Normotensive -Metoprolol as above  Chronic COPD: Stable -Incruse Ellipta and as needed nebulizers  BPH without LUTS -Monitor urine output  Body mass index is 28.21 kg/m.         DVT prophylaxis:  SCDs Start: 09/28/20 1553 Place and maintain sequential compression device Start: 09/28/20 1531  Code Status: Full code Family Communication: Patient and/or RN. Available if any question.  Level of care: Telemetry Medical Status is: Inpatient  Remains inpatient appropriate because:Ongoing diagnostic testing needed not appropriate for outpatient work up and Inpatient level of care appropriate due to severity of illness  Dispo: The patient is from: Home              Anticipated d/c is to: Home              Patient currently is not medically stable to d/c.   Difficult to place patient No       Consultants:  Neurology Neurosurgery Interventional radiology   Sch Meds:  Scheduled Meds:  amiodarone  200 mg Oral q AM   FLUoxetine  20 mg Oral QHS   melatonin  3 mg Oral QHS   metoprolol tartrate  25 mg Oral BID   montelukast  10 mg Oral QHS   pravastatin  20 mg Oral q1800   terazosin  5 mg Oral Daily   thiamine  100 mg Oral Daily   umeclidinium bromide  1 puff Inhalation Daily   Continuous Infusions: PRN Meds:.acetaminophen **OR** acetaminophen, acetaminophen, albuterol, hydrALAZINE  Antimicrobials: Anti-infectives (From admission, onward)    None        I have personally reviewed the following labs and images: CBC: Recent Labs  Lab 09/27/20 1813 09/29/20 1248  WBC 7.2 6.9  NEUTROABS 4.8  --   HGB 14.7 14.5  HCT 43.8 43.5  MCV 93.6 94.2  PLT 241 222   BMP &GFR Recent Labs  Lab 09/27/20 1813 09/29/20 1248  NA 134* 132*  K 4.0 4.0  CL 101 99  CO2 27 26  GLUCOSE 83 92  BUN 16 13  CREATININE 0.70 0.57*  CALCIUM 8.9 8.9   Estimated Creatinine Clearance: 92.4 mL/min (A) (by C-G formula based on SCr of 0.57 mg/dL  (L)). Liver & Pancreas: Recent Labs  Lab 09/27/20 1813  AST 27  ALT 31  ALKPHOS 107  BILITOT 0.4  PROT 6.8  ALBUMIN 3.6   No results for input(s): LIPASE, AMYLASE in the last 168 hours. No results for input(s): AMMONIA in the last 168 hours. Diabetic: No results for input(s): HGBA1C in the last 72 hours. No results for input(s): GLUCAP in the last 168 hours. Cardiac Enzymes: No results for input(s): CKTOTAL, CKMB, CKMBINDEX, TROPONINI in the last 168 hours. No results for input(s): PROBNP in the last 8760 hours. Coagulation Profile: Recent Labs  Lab 09/27/20 1813  INR 1.3*   Thyroid Function Tests: No results for input(s): TSH, T4TOTAL, FREET4, T3FREE, THYROIDAB in the last 72 hours. Lipid Profile: No results for input(s): CHOL, HDL, LDLCALC, TRIG, CHOLHDL, LDLDIRECT in the last 72 hours. Anemia Panel: No results for input(s):  VITAMINB12, FOLATE, FERRITIN, TIBC, IRON, RETICCTPCT in the last 72 hours. Urine analysis:    Component Value Date/Time   COLORURINE YELLOW 08/14/2020 1440   APPEARANCEUR HAZY (A) 08/14/2020 1440   LABSPEC 1.036 (H) 08/14/2020 1440   PHURINE 5.0 08/14/2020 1440   GLUCOSEU NEGATIVE 08/14/2020 1440   HGBUR NEGATIVE 08/14/2020 1440   BILIRUBINUR NEGATIVE 08/14/2020 1440   KETONESUR NEGATIVE 08/14/2020 1440   PROTEINUR NEGATIVE 08/14/2020 1440   NITRITE NEGATIVE 08/14/2020 1440   LEUKOCYTESUR NEGATIVE 08/14/2020 1440   Sepsis Labs: Invalid input(s): PROCALCITONIN, LACTICIDVEN  Microbiology: Recent Results (from the past 240 hour(s))  SARS Coronavirus 2 (TAT 6-24 hrs)     Status: None   Collection Time: 09/25/20 12:00 AM  Result Value Ref Range Status   SARS Coronavirus 2 RESULT: NEGATIVE  Final    Comment: RESULT: NEGATIVESARS-CoV-2 INTERPRETATION:A NEGATIVE  test result means that SARS-CoV-2 RNA was not present in the specimen above the limit of detection of this test. This does not preclude a possible SARS-CoV-2 infection and should not be  used as the  sole basis for patient management decisions. Negative results must be combined with clinical observations, patient history, and epidemiological information. Optimum specimen types and timing for peak viral levels during infections caused by SARS-CoV-2  have not been determined. Collection of multiple specimens or types of specimens may be necessary to detect virus. Improper specimen collection and handling, sequence variability under primers/probes, or organism present below the limit of detection may  lead to false negative results. Positive and negative predictive values of testing are highly dependent on prevalence. False negative test results are more likely when prevalence of disease is high.The expected result is NEGATIVE.Fact S heet for  Healthcare Providers: CollegeCustoms.gl Sheet for Patients: https://poole-freeman.org/ Reference Range - Negative   Resp Panel by RT-PCR (Flu A&B, Covid) Nasopharyngeal Swab     Status: None   Collection Time: 09/27/20  6:13 PM   Specimen: Nasopharyngeal Swab; Nasopharyngeal(NP) swabs in vial transport medium  Result Value Ref Range Status   SARS Coronavirus 2 by RT PCR NEGATIVE NEGATIVE Final    Comment: (NOTE) SARS-CoV-2 target nucleic acids are NOT DETECTED.  The SARS-CoV-2 RNA is generally detectable in upper respiratory specimens during the acute phase of infection. The lowest concentration of SARS-CoV-2 viral copies this assay can detect is 138 copies/mL. A negative result does not preclude SARS-Cov-2 infection and should not be used as the sole basis for treatment or other patient management decisions. A negative result may occur with  improper specimen collection/handling, submission of specimen other than nasopharyngeal swab, presence of viral mutation(s) within the areas targeted by this assay, and inadequate number of viral copies(<138 copies/mL). A negative result must be combined  with clinical observations, patient history, and epidemiological information. The expected result is Negative.  Fact Sheet for Patients:  BloggerCourse.com  Fact Sheet for Healthcare Providers:  SeriousBroker.it  This test is no t yet approved or cleared by the [place] FDA and  has been authorized for detection and/or diagnosis of SARS-CoV-2 by FDA under an Emergency Use Authorization (EUA). This EUA will remain  in effect (meaning this test can be used) for the duration of the COVID-19 declaration under Section 564(b)(1) of the Act, 21 U.S.C.section 360bbb-3(b)(1), unless the authorization is terminated  or revoked sooner.       Influenza A by PCR NEGATIVE NEGATIVE Final   Influenza B by PCR NEGATIVE NEGATIVE Final    Comment: (NOTE) The Xpert Xpress SARS-CoV-2/FLU/RSV plus assay  is intended as an aid in the diagnosis of influenza from Nasopharyngeal swab specimens and should not be used as a sole basis for treatment. Nasal washings and aspirates are unacceptable for Xpert Xpress SARS-CoV-2/FLU/RSV testing.  Fact Sheet for Patients: BloggerCourse.com  Fact Sheet for Healthcare Providers: SeriousBroker.it  This test is not yet approved or cleared by the [place] FDA and has been authorized for detection and/or diagnosis of SARS-CoV-2 by FDA under an Emergency Use Authorization (EUA). This EUA will remain in effect (meaning this test can be used) for the duration of the COVID-19 declaration under Section 564(b)(1) of the Act, 21 U.S.C. section 360bbb-3(b)(1), unless the authorization is terminated or revoked.  Performed at [facility], [address] [place], [place] [address]     Radiology Studies: No results found.     Latarra Eagleton T. Sheriece Jefcoat [facility]  If 7PM-7AM, please contact night-coverage [email or web address] 09/30/2020, 6:06 PM

## 2020-09-30 NOTE — Progress Notes (Signed)
Patient ID: Samuel Barnes, male   DOB: 1950-05-31, 70 y.o.   MRN: [id number] BP 128/78 (BP Location: Right Arm)   Pulse 77   Temp 97.6 F (36.4 C) (Oral)   Resp 20   Ht 5\' 9"  (1.753 m)   Wt 86.6 kg   SpO2 96%   BMI 28.21 kg/m  Alert and oriented x 4 Speech is clear and fluent Moving all extremities well, no drift Awaiting cerebral angiogram monday

## 2020-10-01 DIAGNOSIS — Q282 Arteriovenous malformation of cerebral vessels: Secondary | ICD-10-CM | POA: Diagnosis not present

## 2020-10-01 DIAGNOSIS — I2510 Atherosclerotic heart disease of native coronary artery without angina pectoris: Secondary | ICD-10-CM | POA: Diagnosis not present

## 2020-10-01 DIAGNOSIS — I6523 Occlusion and stenosis of bilateral carotid arteries: Secondary | ICD-10-CM | POA: Diagnosis not present

## 2020-10-01 DIAGNOSIS — I629 Nontraumatic intracranial hemorrhage, unspecified: Secondary | ICD-10-CM | POA: Diagnosis not present

## 2020-10-01 LAB — SURGICAL PCR SCREEN
MRSA, PCR: NEGATIVE
Staphylococcus aureus: NEGATIVE

## 2020-10-01 MED ORDER — POLYETHYLENE GLYCOL 3350 17 G PO PACK
17.0000 g | PACK | Freq: Two times a day (BID) | ORAL | Status: DC | PRN
  Administered 2020-10-01: 17 g via ORAL
  Filled 2020-10-01: qty 1

## 2020-10-01 NOTE — Progress Notes (Signed)
PROGRESS NOTE  [facility] SEG:[id number] DOB: October 21, 1950   PCP: [name], PA  Patient is from: Home.  Independent at baseline.  DOA: 09/27/2020 LOS: 2  Chief complaints:  Chief Complaint  Patient presents with   Head Injury     Brief Narrative / Interim history: 70 year old M with PMH of chronic A. fib on Eliquis, HTN, CAD, diastolic CHF, COPD and BPH presenting with near syncope and fall, and admitted for near syncope and ICH.  Diagnosed with A. fib this summer and started on amiodarone, metoprolol and Eliquis.  Has been feeling lightheaded since he started taking those medications.  As a result, his metoprolol was decreased to 50 mg in the morning and 25 mg in the evening by his cardiologist but his lightheadedness did not improve and he had a fall backward with his neck eating glass tabletop without LOC.  CT head without contrast showed about 7 mm acute parenchymal bleed with surrounding edema within the left occipital lobe.  The next day, neurology and neurosurgery consulted.  MRI brain without contrast confirmed acute left occipital intraparenchymal hemorrhage.  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  MRI brain with contrast "highly suggestive for AVM and 7 mm rounded focus of enhancement suspicious for venous aneurysm".    Angiography planned for 9/26.  Remained stable for neuro standpoint   Subjective: Seen and examined earlier this morning.  No major events overnight of this morning.  Had a mild headache that has resolved with Tylenol.  Denies acute vision change, focal weakness, numbness or tingling.  Denies dizziness, nausea or vomiting.  Objective: Vitals:   09/30/20 2057 10/01/20 0000 10/01/20 0411 10/01/20 0752  BP: (!) 151/82 136/84 133/76 (!)  148/99  Pulse: 66 62 (!) 56 60  Resp: 18 18 18 18   Temp: 97.7 F (36.5 C) 97.7 F (36.5 C) 97.7 F (36.5 C) 97.6 F (36.4 C)  TempSrc: Oral  Oral Oral  SpO2: 96% 94% 96% 95%  Weight:      Height:        Intake/Output Summary (Last 24 hours) at 10/01/2020 1450 Last data filed at 10/01/2020 0843 Gross per 24 hour  Intake 240 ml  Output 800 ml  Net -560 ml   Filed Weights   09/27/20 1818  Weight: 86.6 kg    Examination:  GENERAL: No apparent distress.  Nontoxic. HEENT: MMM.  Vision and hearing grossly intact.  NECK: Supple.  No apparent JVD.  RESP: On RA.  No IWOB.  Fair aeration bilaterally. CVS:  RRR. Heart sounds normal.  ABD/GI/GU: BS+. Abd soft, NTND.  MSK/EXT:  Moves extremities. No apparent deformity. No edema.  SKIN: no apparent skin lesion or wound NEURO: Awake and alert. Oriented appropriately.  No apparent focal neuro deficit. PSYCH: Calm. Normal affect.   Procedures:  None  Microbiology summarized: COVID-19 and influenza PCR nonreactive.  Assessment & Plan: Near syncope/fall-temporal association with initiation of metoprolol for PAF in the setting of his underlying carotid artery stenosis suggest symptomatic carotid artery stenosis.  Neuro exam intact. -IR has been consulted for angiography -Metoprolol reduced to 25 mg twice daily -Fall precaution  Acute intracranial parenchymal hemorrhage-likely from the above.  Patient is on Eliquis for A. fib.  Neuro exam intact. 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 Fib-currently rate controlled. -Continue amiodarone and reduced dose of metoprolol -Eliquis on hold in the setting of ICH  Chronic diastolic CHF: TTE 07/2020 with LVEF of 55 to 60%, RVSP of 36.5.  Not on diuretics at home.  Appears euvolemic on exam. -Monitor fluid status  History of CAD?  No anginal  symptoms. -Continue statin  Essential hypertension: Normotensive -Metoprolol as above  Chronic COPD: Stable -Incruse Ellipta and as needed nebulizers  BPH without LUTS -Monitor urine output  Body mass index is 28.21 kg/m.         DVT prophylaxis:  SCDs Start: 09/28/20 1553 Place and maintain sequential compression device Start: 09/28/20 1531  Code Status: Full code Family Communication: Patient and/or RN. Available if any question.  Level of care: Telemetry Medical Status is: Inpatient  Remains inpatient appropriate because:Ongoing diagnostic testing needed not appropriate for outpatient work up and Inpatient level of care appropriate due to severity of illness  Dispo: The patient is from: Home              Anticipated d/c is to: Home              Patient currently is not medically stable to d/c.   Difficult to place patient No       Consultants:  Neurology Neurosurgery Interventional radiology   Sch Meds:  Scheduled Meds:  amiodarone  200 mg Oral q AM   FLUoxetine  20 mg Oral QHS   melatonin  3 mg Oral QHS   metoprolol tartrate  25 mg Oral BID   montelukast  10 mg Oral QHS   pravastatin  20 mg Oral q1800   terazosin  5 mg Oral Daily   thiamine  100 mg Oral Daily   umeclidinium bromide  1 puff Inhalation Daily   Continuous Infusions: PRN Meds:.acetaminophen **OR** acetaminophen, acetaminophen, albuterol, hydrALAZINE, polyethylene glycol  Antimicrobials: Anti-infectives (From admission, onward)    None        I have personally reviewed the following labs and images: CBC: Recent Labs  Lab 09/27/20 1813 09/29/20 1248  WBC 7.2 6.9  NEUTROABS 4.8  --   HGB 14.7 14.5  HCT 43.8 43.5  MCV 93.6 94.2  PLT 241 222   BMP &GFR Recent Labs  Lab 09/27/20 1813 09/29/20 1248  NA 134* 132*  K 4.0 4.0  CL 101 99  CO2 27 26  GLUCOSE 83 92  BUN 16 13  CREATININE 0.70 0.57*  CALCIUM 8.9 8.9   Estimated Creatinine Clearance: 92.4 mL/min (A) (by  C-G formula based on SCr of 0.57 mg/dL (L)). Liver & Pancreas: Recent Labs  Lab 09/27/20 1813  AST 27  ALT 31  ALKPHOS 107  BILITOT 0.4  PROT 6.8  ALBUMIN 3.6   No results for input(s): LIPASE, AMYLASE in the last 168 hours. No results for input(s): AMMONIA in the last 168 hours. Diabetic: No results for input(s): HGBA1C in the last 72 hours. No results for input(s): GLUCAP in the last 168 hours. Cardiac Enzymes: No results for input(s): CKTOTAL, CKMB, CKMBINDEX, TROPONINI in the last 168 hours. No results for input(s): PROBNP in the last 8760 hours. Coagulation Profile: Recent Labs  Lab 09/27/20 1813  INR 1.3*   Thyroid Function Tests: No results for input(s): TSH, T4TOTAL, FREET4, T3FREE, THYROIDAB in the last 72 hours. Lipid Profile: No results for input(s): CHOL, HDL, LDLCALC, TRIG, CHOLHDL, LDLDIRECT in the last 72 hours.  Anemia Panel: No results for input(s): VITAMINB12, FOLATE, FERRITIN, TIBC, IRON, RETICCTPCT in the last 72 hours. Urine analysis:    Component Value Date/Time   COLORURINE YELLOW 08/14/2020 1440   APPEARANCEUR HAZY (A) 08/14/2020 1440   LABSPEC 1.036 (H) 08/14/2020 1440   PHURINE 5.0 08/14/2020 1440   GLUCOSEU NEGATIVE 08/14/2020 1440   HGBUR NEGATIVE 08/14/2020 1440   BILIRUBINUR NEGATIVE 08/14/2020 1440   KETONESUR NEGATIVE 08/14/2020 1440   PROTEINUR NEGATIVE 08/14/2020 1440   NITRITE NEGATIVE 08/14/2020 1440   LEUKOCYTESUR NEGATIVE 08/14/2020 1440   Sepsis Labs: Invalid input(s): PROCALCITONIN, LACTICIDVEN  Microbiology: Recent Results (from the past 240 hour(s))  SARS Coronavirus 2 (TAT 6-24 hrs)     Status: None   Collection Time: 09/25/20 12:00 AM  Result Value Ref Range Status   SARS Coronavirus 2 RESULT: NEGATIVE  Final    Comment: RESULT: NEGATIVESARS-CoV-2 INTERPRETATION:A NEGATIVE  test result means that SARS-CoV-2 RNA was not present in the specimen above the limit of detection of this test. This does not preclude a possible  SARS-CoV-2 infection and should not be used as the  sole basis for patient management decisions. Negative results must be combined with clinical observations, patient history, and epidemiological information. Optimum specimen types and timing for peak viral levels during infections caused by SARS-CoV-2  have not been determined. Collection of multiple specimens or types of specimens may be necessary to detect virus. Improper specimen collection and handling, sequence variability under primers/probes, or organism present below the limit of detection may  lead to false negative results. Positive and negative predictive values of testing are highly dependent on prevalence. False negative test results are more likely when prevalence of disease is high.The expected result is NEGATIVE.Fact S heet for  Healthcare Providers: CollegeCustoms.gl Sheet for Patients: https://poole-freeman.org/ Reference Range - Negative   Resp Panel by RT-PCR (Flu A&B, Covid) Nasopharyngeal Swab     Status: None   Collection Time: 09/27/20  6:13 PM   Specimen: Nasopharyngeal Swab; Nasopharyngeal(NP) swabs in vial transport medium  Result Value Ref Range Status   SARS Coronavirus 2 by RT PCR NEGATIVE NEGATIVE Final    Comment: (NOTE) SARS-CoV-2 target nucleic acids are NOT DETECTED.  The SARS-CoV-2 RNA is generally detectable in upper respiratory specimens during the acute phase of infection. The lowest concentration of SARS-CoV-2 viral copies this assay can detect is 138 copies/mL. A negative result does not preclude SARS-Cov-2 infection and should not be used as the sole basis for treatment or other patient management decisions. A negative result may occur with  improper specimen collection/handling, submission of specimen other than nasopharyngeal swab, presence of viral mutation(s) within the areas targeted by this assay, and inadequate number of viral copies(<138  copies/mL). A negative result must be combined with clinical observations, patient history, and epidemiological information. The expected result is Negative.  Fact Sheet for Patients:  BloggerCourse.com  Fact Sheet for Healthcare Providers:  SeriousBroker.it  This test is no t yet approved or cleared by the [place] FDA and  has been authorized for detection and/or diagnosis of SARS-CoV-2 by FDA under an Emergency Use Authorization (EUA). This EUA will remain  in effect (meaning this test can be used) for the duration of the COVID-19 declaration under Section 564(b)(1) of the Act, 21 U.S.C.section 360bbb-3(b)(1), unless the authorization is terminated  or revoked sooner.       Influenza A by PCR NEGATIVE NEGATIVE Final   Influenza B by PCR NEGATIVE NEGATIVE Final    Comment: (NOTE)  The Xpert Xpress SARS-CoV-2/FLU/RSV plus assay is intended as an aid in the diagnosis of influenza from Nasopharyngeal swab specimens and should not be used as a sole basis for treatment. Nasal washings and aspirates are unacceptable for Xpert Xpress SARS-CoV-2/FLU/RSV testing.  Fact Sheet for Patients: BloggerCourse.com  Fact Sheet for Healthcare Providers: SeriousBroker.it  This test is not yet approved or cleared by the [place] FDA and has been authorized for detection and/or diagnosis of SARS-CoV-2 by FDA under an Emergency Use Authorization (EUA). This EUA will remain in effect (meaning this test can be used) for the duration of the COVID-19 declaration under Section 564(b)(1) of the Act, 21 U.S.C. section 360bbb-3(b)(1), unless the authorization is terminated or revoked.  Performed at [facility], [address] [place], [place] [address]     Radiology Studies: No results found.     Alma Mohiuddin T. Mieshia Pepitone [facility]  If 7PM-7AM, please contact  night-coverage [email or web address] 10/01/2020, 2:50 PM

## 2020-10-01 NOTE — Progress Notes (Signed)
Patient ID: Samuel Barnes, male   DOB: 07/30/1950, 70 y.o.   MRN: [id number] Needs angio Will attempt for tomorrow Neurologically normal BP (!) 148/99 (BP Location: Right Arm)   Pulse 60   Temp 97.6 F (36.4 C) (Oral)   Resp 18   Ht 5\' 9"  (1.753 m)   Wt 86.6 kg   SpO2 95%   BMI 28.21 kg/m

## 2020-10-01 NOTE — Plan of Care (Signed)

## 2020-10-02 ENCOUNTER — Encounter (HOSPITAL_COMMUNITY): Payer: Self-pay | Admitting: Internal Medicine

## 2020-10-02 ENCOUNTER — Inpatient Hospital Stay (HOSPITAL_COMMUNITY): Payer: Medicare Other

## 2020-10-02 DIAGNOSIS — I2510 Atherosclerotic heart disease of native coronary artery without angina pectoris: Secondary | ICD-10-CM | POA: Diagnosis not present

## 2020-10-02 DIAGNOSIS — I6523 Occlusion and stenosis of bilateral carotid arteries: Secondary | ICD-10-CM | POA: Diagnosis not present

## 2020-10-02 DIAGNOSIS — I629 Nontraumatic intracranial hemorrhage, unspecified: Secondary | ICD-10-CM | POA: Diagnosis not present

## 2020-10-02 DIAGNOSIS — Q282 Arteriovenous malformation of cerebral vessels: Secondary | ICD-10-CM | POA: Diagnosis not present

## 2020-10-02 MED ORDER — FENTANYL CITRATE (PF) 100 MCG/2ML IJ SOLN
INTRAMUSCULAR | Status: AC
  Filled 2020-10-02: qty 2

## 2020-10-02 MED ORDER — HYDRALAZINE HCL 20 MG/ML IJ SOLN: 5.0000 mg | INTRAMUSCULAR | Status: DC | PRN

## 2020-10-02 MED ORDER — AMLODIPINE BESYLATE 5 MG PO TABS
5.0000 mg | ORAL_TABLET | Freq: Every day | ORAL | Status: DC
  Administered 2020-10-02 – 2020-10-04 (×3): 5 mg via ORAL
  Filled 2020-10-02 (×3): qty 1

## 2020-10-02 MED ORDER — MIDAZOLAM HCL 2 MG/2ML IJ SOLN
INTRAMUSCULAR | Status: AC
  Filled 2020-10-02: qty 2

## 2020-10-02 NOTE — Progress Notes (Signed)
Patient ID: Samuel Barnes, male   DOB: [birth year], 70 y.o.   MRN: [id number] BP (!) 166/100 (BP Location: Right Arm)   Pulse (!) 59   Temp (!) 97.5 F (36.4 C) (Oral)   Resp 19   Ht 5\' 9"  (1.753 m)   Wt 86.6 kg   SpO2 95%   BMI 28.21 kg/m  Mr. Samuel Barnes did not receive his angio, as he was taken off the table for an emergent CVA Will try again tomorrow Neurologically normal at this time.  Moving all extremities well Perrl, full eom Symmetric facies, tongue and uvula midline No drift Angio needed for diagnosis

## 2020-10-02 NOTE — Progress Notes (Signed)
Chief Complaint: Patient was seen in consultation today for cerebral angiogram  Referring Physician(s): Dr. [name]  Supervising Physician: Joana Reamer Kizzie Fantasia  Patient Status: [facility] - In-pt  History of Present Illness: Samuel Barnes is a 70 y.o. male with acute left occipital intraparenchymal hemorrhage. Also found to have possible AVM. NIR is asked to eval for cerebral angiogram. PMHx, meds, labs, imaging, allergies reviewed. Feels well, no recent fevers, chills, illness. Has been NPO today as directed.   Past Medical History:  Diagnosis Date   Aortic atherosclerosis (HCC) 07/28/2020   BPH (benign prostatic hyperplasia)    Class 2 obesity 07/28/2020   Coronary artery calcification 07/28/2020   Depression    Family history of adverse reaction to anesthesia    Hyperlipidemia 07/28/2020   Hypertension    Tobacco use 07/28/2020    Past Surgical History:  Procedure Laterality Date   CARDIOVERSION N/A 08/02/2020   Procedure: CARDIOVERSION;  Surgeon: Meriam Sprague, MD;  Location: [facility] ENDOSCOPY;  Service: Cardiovascular;  Laterality: N/A;   IR THORACENTESIS ASP PLEURAL SPACE W/IMG GUIDE  07/31/2020   TEE WITHOUT CARDIOVERSION N/A 08/02/2020   Procedure: TRANSESOPHAGEAL ECHOCARDIOGRAM (TEE);  Surgeon: Meriam Sprague, MD;  Location: [facility] ENDOSCOPY;  Service: Cardiovascular;  Laterality: N/A;   TRANSURETHRAL RESECTION OF PROSTATE      Allergies: Ciprofloxacin and Gabapentin  Medications:  Current Facility-Administered Medications:    acetaminophen (TYLENOL) tablet 650 mg, 650 mg, Oral, Q6H PRN **OR** acetaminophen (TYLENOL) suppository 650 mg, 650 mg, Rectal, Q6H PRN, Mikey College T, MD   acetaminophen (TYLENOL) tablet 1,000 mg, 1,000 mg, Oral, Q6H PRN, Terald Sleeper, MD, 1,000 mg at 10/02/20 1006   albuterol (VENTOLIN HFA) 108 (90 Base) MCG/ACT inhaler 2 puff, 2 puff, Inhalation, Q4H PRN, Trifan, Kermit Balo, MD   amiodarone (PACERONE)  tablet 200 mg, 200 mg, Oral, q AM, Renaye Rakers, Kermit Balo, MD, 200 mg at 10/01/20 0641   FLUoxetine (PROZAC) capsule 20 mg, 20 mg, Oral, QHS, Trifan, Kermit Balo, MD, 20 mg at 10/01/20 2100   hydrALAZINE (APRESOLINE) tablet 25 mg, 25 mg, Oral, Q6H PRN, Mikey College T, MD   melatonin tablet 3 mg, 3 mg, Oral, QHS, Mikey College T, MD, 3 mg at 10/01/20 2100   metoprolol tartrate (LOPRESSOR) tablet 25 mg, 25 mg, Oral, BID, Mikey College T, MD, 25 mg at 10/02/20 1006   montelukast (SINGULAIR) tablet 10 mg, 10 mg, Oral, QHS, Pahwani, Ravi, MD, 10 mg at 10/01/20 2100   polyethylene glycol (MIRALAX / GLYCOLAX) packet 17 g, 17 g, Oral, BID PRN, Alanda Slim, Taye T, MD, 17 g at 10/01/20 1700   pravastatin (PRAVACHOL) tablet 20 mg, 20 mg, Oral, q1800, Pahwani, Ravi, MD, 20 mg at 10/01/20 1742   terazosin (HYTRIN) capsule 5 mg, 5 mg, Oral, Daily, Pahwani, Ravi, MD, 5 mg at 10/02/20 1006   thiamine tablet 100 mg, 100 mg, Oral, Daily, Trifan, Kermit Balo, MD, 100 mg at 10/02/20 1007   umeclidinium bromide (INCRUSE ELLIPTA) 62.5 MCG/INH 1 puff, 1 puff, Inhalation, Daily, Mikey College T, MD, 1 puff at 10/02/20 0818    Family History  Problem Relation Age of Onset   Coronary artery disease Mother    Hypertension Mother    Hyperlipidemia Mother    Ovarian cancer Mother    Ovarian cancer Sister    Liver cancer Sister    Liver cancer Brother     Social History   Socioeconomic History   Marital status: Divorced    Spouse  name: Not on file   Number of children: Not on file   Years of education: Not on file   Highest education level: Not on file  Occupational History   Not on file  Tobacco Use   Smoking status: Former    Types: Cigarettes    Start date: 55    Quit date: 08/14/2020    Years since quitting: 0.1   Smokeless tobacco: Never  Vaping Use   Vaping Use: Never used  Substance and Sexual Activity   Alcohol use: Not Currently    Alcohol/week: 1.0 standard drink    Types: 1 Shots of liquor per week   Drug  use: Never   Sexual activity: Not on file  Other Topics Concern   Not on file  Social History Narrative   Not on file   Social Determinants of Health   Financial Resource Strain: Not on file  Food Insecurity: Not on file  Transportation Needs: Not on file  Physical Activity: Not on file  Stress: Not on file  Social Connections: Not on file    Review of Systems: A 12 point ROS discussed and pertinent positives are indicated in the HPI above.  All other systems are negative.  Review of Systems  Vital Signs: BP (!) 160/100 (BP Location: Left Arm)   Pulse (!) 58   Temp (!) 97.4 F (36.3 C) (Oral)   Resp 20   Ht 5\' 9"  (1.753 m)   Wt 86.6 kg   SpO2 98%   BMI 28.21 kg/m   Physical Exam Constitutional:      Appearance: Normal appearance. He is not ill-appearing.  HENT:     Mouth/Throat:     Mouth: Mucous membranes are moist.     Pharynx: Oropharynx is clear.  Cardiovascular:     Rate and Rhythm: Normal rate and regular rhythm.     Pulses: Normal pulses.     Heart sounds: Normal heart sounds.  Pulmonary:     Effort: Pulmonary effort is normal. No respiratory distress.     Breath sounds: Normal breath sounds.  Neurological:     General: No focal deficit present.     Mental Status: He is alert and oriented to person, place, and time.  Psychiatric:        Mood and Affect: Mood normal.        Thought Content: Thought content normal.        Judgment: Judgment normal.    Imaging: CT ANGIO HEAD NECK W WO CM  Result Date: 09/29/2020 CLINICAL DATA:  Stroke, follow-up EXAM: CT ANGIOGRAPHY HEAD AND NECK TECHNIQUE: Multidetector CT imaging of the head and neck was performed using the standard protocol during bolus administration of intravenous contrast. Multiplanar CT image reconstructions and MIPs were obtained to evaluate the vascular anatomy. Carotid stenosis measurements (when applicable) are obtained utilizing NASCET criteria, using the distal internal carotid diameter as  the denominator. CONTRAST:  71mL OMNIPAQUE IOHEXOL 350 MG/ML SOLN COMPARISON:  Recent CT and MR imaging FINDINGS: CT HEAD Brain: Small area of recent hemorrhage is again identified in the left occipital lobe. Slightly increased adjacent edema. No significant mass effect. No new hemorrhage. Vascular: There is atherosclerotic calcification at the skull base. Skull: Calvarium is unremarkable. Sinuses/Orbits: No acute finding. Other: None. Review of the MIP images confirms the above findings CTA NECK Aortic arch: Mixed plaque along the arch and great vessel origins. Right carotid system: Patent. Mixed plaque along the common carotid causing nearly 50% stenosis. Calcified plaque  along the proximal internal carotid causing less than 50% stenosis. Left carotid system: Patent. Mixed but primarily calcified plaque along the common carotid causing less than 50% stenosis. Heavy calcified plaque at the bifurcation causing 65% stenosis. Calcified plaque along the proximal internal carotid causing less than 50% stenosis. Vertebral arteries: Patent. Right vertebral artery slightly dominant. Calcified plaque at the right vertebral origin causing at least moderate stenosis. Skeleton: Degenerative changes of the included spine. Other neck: Unremarkable. Upper chest: Right pleural effusion. Review of the MIP images confirms the above findings CTA HEAD Anterior circulation: Intracranial internal carotid arteries are patent with calcified plaque causing mild stenosis. Anterior and middle cerebral arteries are patent. Posterior circulation: Intracranial vertebral arteries are patent with mild calcified plaque on the left. Basilar artery is patent. Major cerebellar artery origins are patent. Posterior cerebral arteries are patent. Left posterior communicating artery is the primary supply of the left PCA. Venous sinuses: Patent as allowed by contrast bolus timing. There is a prominent draining vein in the left occipital lobe in proximity  to the area of hemorrhage. There is no tangle of vessels to suggest a nidus. Review of the MIP images confirms the above findings IMPRESSION: Similar small area of recent hemorrhage in the left occipital lobe with slightly increased surrounding edema. Prominent draining vein in the left occipital lobe in proximity area of hemorrhage. No evidence of a nidus. 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 Otherwise, NIR consultation suggested to evaluate utility of catheter angiography. Plaque along the right common carotid causing nearly 50% stenosis. Heavily calcified plaque just below the left common carotid bifurcation causing 65% stenosis. Less than 50% stenosis along the proximal internal carotids. Calcified plaque at the right vertebral origin causing at least moderate stenosis. Electronically Signed   By: Guadlupe Spanish M.D.   On: 09/29/2020 14:49   CT HEAD WO CONTRAST ( )  Result Date: 09/27/2020 CLINICAL DATA:  Status post trauma. EXAM: CT HEAD WITHOUT CONTRAST TECHNIQUE: Contiguous axial images were obtained from the base of the skull through the vertex without intravenous contrast. COMPARISON:  August 14, 2020 FINDINGS: Brain: There is mild cerebral atrophy with widening of the extra-axial spaces and ventricular dilatation. There are areas of decreased attenuation within the white matter tracts of the supratentorial brain, consistent with microvascular disease changes. A 7 mm hyperdense focus is seen within the subcortical white matter of the left occipital lobe. A mild amount of surrounding white matter low attenuation is seen. A mild amount of mass effect is noted on the adjacent sulci. There is no evidence of midline shift. Vascular: No hyperdense vessel or unexpected calcification. Skull: Normal. Negative for fracture or focal lesion. Sinuses/Orbits: Very mild bilateral ethmoid sinus mucosal thickening is seen. Other: None.  IMPRESSION: 1. 7 mm acute parenchymal bleed and surrounding edema within the left occipital lobe. MRI correlation is recommended. 2. Generalized cerebral atrophy. Electronically Signed   By: Aram Candela M.D.   On: 09/27/2020 17:52   MR BRAIN WO CONTRAST  Result Date: 09/28/2020 EXAM: MRI HEAD WITHOUT CONTRAST TECHNIQUE: Multiplanar, multiecho pulse sequences of the brain and surrounding structures were obtained without intravenous contrast. COMPARISON:  CT head 09/27/2020.  MRI 08/16/2020. FINDINGS: Brain: Acute hemorrhage in the left occipital lobe with associated T1 hyperintensity and susceptibility artifact. When comparing across modalities, the size of the hemorrhage is likely similar. There is surrounding edema without significant mass effect. No surrounding restricted diffusion to suggest acute infarct.  In the region of hemorrhage there is a tubular T2 hypointense flow void, compatible with prominent vessel that courses anterolaterally towards the left lateral ventricle (for example, see series 5, image 12). No midline shift, hydrocephalus, or extra-axial fluid collection. Additional mild scattered T2 hyperintensities within the white matter, nonspecific but compatible with chronic microvascular ischemic disease. Vascular: Major arterial flow voids are maintained at the skull base. Flow void in the region of the left occipital hemorrhage is detailed above. Skull and upper cervical spine: Normal marrow signal. Sinuses/Orbits: Clear sinuses.  No acute orbital findings. Other: No mastoid effusions. IMPRESSION: 1. Acute left occipital intraparenchymal hemorrhage with surrounding edema. There is a prominent vessel in this region, which courses towards the left lateral ventricle. Recommend postcontrast MRI and CTA to further evaluate and to exclude a vascular malformation as a cause of the hemorrhage. 2. No surrounding restricted diffusion to suggest acute infarct. Electronically Signed   By: Feliberto Harts M.D.   On: 09/28/2020 13:48   MR BRAIN W CONTRAST  Result Date: 09/29/2020 CLINICAL DATA:  Neuro deficit, acute, stroke suspected. EXAM: MRI HEAD WITH CONTRAST TECHNIQUE: Multiplanar, multiecho pulse sequences of the brain and surrounding structures were obtained with intravenous contrast. CONTRAST:  16mL GADAVIST GADOBUTROL 1 MMOL/ML IV SOLN COMPARISON:  CT angiogram head/neck 09/29/2020. Brain MRI 09/28/2020. Noncontrast head CT 09/27/2020. FINDINGS: Brain: A contrast-enhanced brain MRI was performed as a follow-up to the recent noncontrast brain MRI of 09/28/2020. On today's examination, the following sequences were acquired: Axial precontrast T1 weighted sequence, axial postcontrast T1 weighted sequence, coronal T1 weighted postcontrast sequence. The coronal T1 weighted postcontrast sequence is mildly motion degraded. Grossly unchanged size of a small focus of recent parenchymal hemorrhage within the left occipital lobe. Adjacent parenchymal edema also does not appear significantly changed. Redemonstrated abnormal prominent venous vessel within the left occipital region coursing in close proximity to the parenchymal hemorrhage. There is an associated 7 mm rounded focus of enhancement along the course of this vessel, immediately adjacent to the parenchymal hemorrhage, suspicious for a venous aneurysm (series 8, image 23). No abnormal intracranial enhancement is identified elsewhere. Vascular: Enhancement within the proximal large arterial vessels. Abnormal prominent venous vessel in the left occipital region, as described above. Skull and upper cervical spine: No appreciable focal suspicious marrow lesion. Sinuses/Orbits: Mild bilateral ethmoid sinus mucosal thickening. IMPRESSION: Grossly unchanged small focus of recent parenchymal hemorrhage within the left occipital lobe with surrounding parenchymal edema. Redemonstrated abnormal prominent venous vessel within the left occipital region, coursing in  close proximity to the parenchymal hemorrhage. This constellation of findings is highly suggestive of an underlying arteriovenous malformation (AVM). Additionally, there is a 7 mm rounded focus of enhancement along the course of the described abnormal vessel, immediately adjacent to the parenchymal hemorrhage, suspicious for a venous aneurysm. Catheter-based angiography is recommended for further evaluation. Electronically Signed   By: Jackey Loge D.O.   On: 09/29/2020 15:10   DG Knee Complete 4 Views Left  Result Date: 09/27/2020 CLINICAL DATA:  Status post fall. EXAM: LEFT KNEE - COMPLETE 4+ VIEW COMPARISON:  None. FINDINGS: No evidence of fracture, dislocation, or joint effusion. Mild medial and lateral tibiofemoral compartment space narrowing is seen. Moderate to marked severity vascular calcification is seen. IMPRESSION: 1. No acute osseous abnormality. Electronically Signed   By: Aram Candela M.D.   On: 09/27/2020 17:54    Labs:  CBC: Recent Labs    08/15/20 0600 08/21/20 0400 09/27/20 1813 09/29/20 1248  WBC 8.8 10.9*  7.2 6.9  HGB 15.5 15.2 14.7 14.5  HCT 46.5 45.6 43.8 43.5  PLT 308 266 241 222    COAGS: Recent Labs    09/27/20 1813  INR 1.3*  APTT 36    BMP: Recent Labs    08/18/20 0104 08/21/20 0400 09/27/20 1813 09/29/20 1248  NA 132* 131* 134* 132*  K 4.2 4.7 4.0 4.0  CL 101 101 101 99  CO2 24 21* 27 26  GLUCOSE 87 100* 83 92  BUN 18 14 16 13   CALCIUM 8.8* 8.7* 8.9 8.9  CREATININE 0.83 0.65 0.70 0.57*  GFRNONAA >60 >60 >60 >60    LIVER FUNCTION TESTS: Recent Labs    08/11/20 1236 08/14/20 1025 08/15/20 0600 09/27/20 1813  BILITOT 0.8 1.3* 1.2 0.4  AST 43* 50* 38 27  ALT 89* 91* 76* 31  ALKPHOS 152* 134* 125 107  PROT 7.0 7.3 6.5 6.8  ALBUMIN 3.6 3.7 3.3* 3.6    TUMOR MARKERS: No results for input(s): AFPTM, CEA, CA199, CHROMGRNA in the last 8760 hours.  Assessment and Plan: Recent intracranial hemorrhage Possible AVM/aneurysm on  imaging. Plan for formal cerebral angiogram today Labs reviewed, ok Risks and benefits of cerebral angio were discussed with the patient including, but not limited to bleeding, infection, vascular injury or contrast induced renal failure.  This interventional procedure involves the use of X-rays and because of the nature of the planned procedure, it is possible that we will have prolonged use of X-ray fluoroscopy.  Potential radiation risks to you include (but are not limited to) the following: - A slightly elevated risk for cancer  several years later in life. This risk is typically less than 0.5% percent. This risk is low in comparison to the normal incidence of human cancer, which is 33% for women and 50% for men according to the [employer]. - Radiation induced injury can include skin redness, resembling a rash, tissue breakdown / ulcers and hair loss (which can be temporary or permanent).   The likelihood of either of these occurring depends on the difficulty of the procedure and whether you are sensitive to radiation due to previous procedures, disease, or genetic conditions.   IF your procedure requires a prolonged use of radiation, you will be notified and given written instructions for further action.  It is your responsibility to monitor the irradiated area for the 2 weeks following the procedure and to notify your physician if you are concerned that you have suffered a radiation induced injury.    All of the patient's questions were answered, patient is agreeable to proceed.  Consent signed and in chart.   Thank you for this interesting consult.  I greatly enjoyed meeting [facility] and look forward to participating in their care.  A copy of this report was sent to the requesting provider on this date.  Electronically Signed: [facility], PA-C 10/02/2020, 11:15 AM   I spent a total of 20 minutes in face to face in clinical consultation, greater than  50% of which was counseling/coordinating care for cerebral angio

## 2020-10-02 NOTE — Progress Notes (Signed)
PT Cancellation Note  Patient Details Name: SIMPSON PAULOS MRN: [id number] DOB: Oct 21, 1950   Cancelled Treatment:    Reason Eval/Treat Not Completed: Patient at procedure or test/unavailable Still OTF at angiography; will follow and attempt to return if time/schedule allow.   Madelaine Etienne, DPT, PN2   Supplemental Physical Therapist [facility]    Pager [phone] Acute Rehab Office [phone]

## 2020-10-02 NOTE — Care Management Important Message (Signed)
Important Message  Patient Details  Name: Samuel Barnes MRN: [id number] Date of Birth: 24-May-1950   Medicare Important Message Given:  Yes     Mikella Linsley Stefan Church 10/02/2020, 4:32 PM

## 2020-10-02 NOTE — Progress Notes (Signed)
PROGRESS NOTE  Samuel [facility] WUJ:[id number] DOB: 05-Apr-1950   PCP: [name], PA  Patient is from: Home.  Independent at baseline.  DOA: 09/27/2020 LOS: 3  Chief complaints:  Chief Complaint  Patient presents with   Head Injury     Brief Narrative / Interim history: 70 year old M with PMH of chronic A. fib on Eliquis, HTN, CAD, diastolic CHF, COPD and BPH presenting with near syncope and fall, and admitted for near syncope and ICH.  Diagnosed with A. fib this summer and started on amiodarone, metoprolol and Eliquis.  Has been feeling lightheaded since he started taking those medications.  As a result, his metoprolol was decreased to 50 mg in the morning and 25 mg in the evening by his cardiologist but his lightheadedness did not improve and he had a fall backward with his neck eating glass tabletop without LOC.  CT head without contrast showed about 7 mm acute parenchymal bleed with surrounding edema within the left occipital lobe.  The next day, neurology and neurosurgery consulted.  MRI brain without contrast confirmed acute left occipital intraparenchymal hemorrhage.  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  MRI brain with contrast "highly suggestive for AVM and 7 mm rounded focus of enhancement suspicious for venous aneurysm".    Angiography planned for 9/26.  Remained stable for neuro standpoint   Subjective: Seen and examined earlier this morning.  No major events overnight or this morning.  He says he could not sleep well thinking about his children and grandchildren who have COVID at home.  No complaints.  He appreciates his care by everyone involved here.  Objective: Vitals:   10/02/20 0100 10/02/20 0450 10/02/20 0807 10/02/20 0819  BP: 120/63 (!) 154/98 (!)  160/100   Pulse: 60 (!) 56 (!) 58   Resp: 20 17 20    Temp: 97.7 F (36.5 C) 97.8 F (36.6 C) (!) 97.4 F (36.3 C)   TempSrc: Oral Oral Oral   SpO2: 96% 94% 97% 98%  Weight:      Height:        Intake/Output Summary (Last 24 hours) at 10/02/2020 1258 Last data filed at 10/02/2020 1000 Gross per 24 hour  Intake 240 ml  Output 350 ml  Net -110 ml   Filed Weights   09/27/20 1818  Weight: 86.6 kg    Examination:  GENERAL: No apparent distress.  Nontoxic. HEENT: MMM.  Vision and hearing grossly intact.  NECK: Supple.  No apparent JVD.  RESP: On RA.  No IWOB.  Fair aeration bilaterally. CVS:  RRR. Heart sounds normal.  ABD/GI/GU: BS+. Abd soft, NTND.  MSK/EXT:  Moves extremities. No apparent deformity. No edema.  SKIN: no apparent skin lesion or wound NEURO: Awake and alert. Oriented appropriately.  No apparent focal neuro deficit. PSYCH: Calm. Normal affect.   Procedures:  None  Microbiology summarized: COVID-19 and influenza PCR nonreactive.  Assessment & Plan: Near syncope/fall-temporal association with initiation of metoprolol for PAF in the setting of his underlying carotid artery stenosis suggest symptomatic carotid artery stenosis.  Neuro exam intact. -IR has been consulted for angiography -Check orthostatic vitals. -Continue reduced dose of metoprolol 25 mg twice daily -Orthostatic vitals -Fall precaution  Acute intracranial parenchymal hemorrhage-likely from the above.  Patient is on Eliquis for A. fib.  Neuro exam  intact. 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 Fib-currently rate controlled. -Continue amiodarone and reduced dose of metoprolol -Eliquis on hold in the setting of ICH  Chronic diastolic CHF: TTE 07/2020 with LVEF of 55 to 60%, RVSP of 36.5.  Not on diuretics at home.  Appears euvolemic on exam. -Monitor fluid  status  History of CAD?  No anginal symptoms. -Continue statin  Essential hypertension: BP elevated.  Borderline bradycardia. -Continue low-dose metoprolol as above -Start low-dose amlodipine.  He will be risk for AKI with ARB and angiography -IV hydralazine 5 mg every 4 hours as needed SBP> 150 or DBP> 100.  Chronic COPD: Stable -Incruse Ellipta and as needed nebulizers  BPH without LUTS -Monitor urine output  Body mass index is 28.21 kg/m.         DVT prophylaxis:  SCDs Start: 09/28/20 1553 Place and maintain sequential compression device Start: 09/28/20 1531  Code Status: Full code Family Communication: Patient and/or RN. Available if any question.  Level of care: Telemetry Medical Status is: Inpatient  Remains inpatient appropriate because:Ongoing diagnostic testing needed not appropriate for outpatient work up and Inpatient level of care appropriate due to severity of illness  Dispo: The patient is from: Home              Anticipated d/c is to: Home              Patient currently is not medically stable to d/c.   Difficult to place patient No       Consultants:  Neurology Neurosurgery Interventional radiology   Sch Meds:  Scheduled Meds:  amiodarone  200 mg Oral q AM   FLUoxetine  20 mg Oral QHS   melatonin  3 mg Oral QHS   metoprolol tartrate  25 mg Oral BID   montelukast  10 mg Oral QHS   pravastatin  20 mg Oral q1800   terazosin  5 mg Oral Daily   thiamine  100 mg Oral Daily   umeclidinium bromide  1 puff Inhalation Daily   Continuous Infusions: PRN Meds:.acetaminophen **OR** acetaminophen, acetaminophen, albuterol, hydrALAZINE, polyethylene glycol  Antimicrobials: Anti-infectives (From admission, onward)    None        I have personally reviewed the following labs and images: CBC: Recent Labs  Lab 09/27/20 1813 09/29/20 1248  WBC 7.2 6.9  NEUTROABS 4.8  --   HGB 14.7 14.5  HCT 43.8 43.5  MCV 93.6 94.2  PLT 241 222   BMP  &GFR Recent Labs  Lab 09/27/20 1813 09/29/20 1248  NA 134* 132*  K 4.0 4.0  CL 101 99  CO2 27 26  GLUCOSE 83 92  BUN 16 13  CREATININE 0.70 0.57*  CALCIUM 8.9 8.9   Estimated Creatinine Clearance: 92.4 mL/min (A) (by C-G formula based on SCr of 0.57 mg/dL (L)). Liver & Pancreas: Recent Labs  Lab 09/27/20 1813  AST 27  ALT 31  ALKPHOS 107  BILITOT 0.4  PROT 6.8  ALBUMIN 3.6   No results for input(s): LIPASE, AMYLASE in the last 168 hours. No results for input(s): AMMONIA in the last 168 hours. Diabetic: No results for input(s): HGBA1C in the last 72 hours. No results for input(s): GLUCAP in the last 168 hours. Cardiac Enzymes: No results for input(s): CKTOTAL, CKMB, CKMBINDEX, TROPONINI in the last 168 hours. No results for input(s): PROBNP in the last 8760 hours. Coagulation Profile:  Recent Labs  Lab 09/27/20 1813  INR 1.3*   Thyroid Function Tests: No results for input(s): TSH, T4TOTAL, FREET4, T3FREE, THYROIDAB in the last 72 hours. Lipid Profile: No results for input(s): CHOL, HDL, LDLCALC, TRIG, CHOLHDL, LDLDIRECT in the last 72 hours. Anemia Panel: No results for input(s): VITAMINB12, FOLATE, FERRITIN, TIBC, IRON, RETICCTPCT in the last 72 hours. Urine analysis:    Component Value Date/Time   COLORURINE YELLOW 08/14/2020 1440   APPEARANCEUR HAZY (A) 08/14/2020 1440   LABSPEC 1.036 (H) 08/14/2020 1440   PHURINE 5.0 08/14/2020 1440   GLUCOSEU NEGATIVE 08/14/2020 1440   HGBUR NEGATIVE 08/14/2020 1440   BILIRUBINUR NEGATIVE 08/14/2020 1440   KETONESUR NEGATIVE 08/14/2020 1440   PROTEINUR NEGATIVE 08/14/2020 1440   NITRITE NEGATIVE 08/14/2020 1440   LEUKOCYTESUR NEGATIVE 08/14/2020 1440   Sepsis Labs: Invalid input(s): PROCALCITONIN, LACTICIDVEN  Microbiology: Recent Results (from the past 240 hour(s))  SARS Coronavirus 2 (TAT 6-24 hrs)     Status: None   Collection Time: 09/25/20 12:00 AM  Result Value Ref Range Status   SARS Coronavirus 2 RESULT:  NEGATIVE  Final    Comment: RESULT: NEGATIVESARS-CoV-2 INTERPRETATION:A NEGATIVE  test result means that SARS-CoV-2 RNA was not present in the specimen above the limit of detection of this test. This does not preclude a possible SARS-CoV-2 infection and should not be used as the  sole basis for patient management decisions. Negative results must be combined with clinical observations, patient history, and epidemiological information. Optimum specimen types and timing for peak viral levels during infections caused by SARS-CoV-2  have not been determined. Collection of multiple specimens or types of specimens may be necessary to detect virus. Improper specimen collection and handling, sequence variability under primers/probes, or organism present below the limit of detection may  lead to false negative results. Positive and negative predictive values of testing are highly dependent on prevalence. False negative test results are more likely when prevalence of disease is high.The expected result is NEGATIVE.Fact S heet for  Healthcare Providers: CollegeCustoms.gl Sheet for Patients: https://poole-freeman.org/ Reference Range - Negative   Resp Panel by RT-PCR (Flu A&B, Covid) Nasopharyngeal Swab     Status: None   Collection Time: 09/27/20  6:13 PM   Specimen: Nasopharyngeal Swab; Nasopharyngeal(NP) swabs in vial transport medium  Result Value Ref Range Status   SARS Coronavirus 2 by RT PCR NEGATIVE NEGATIVE Final    Comment: (NOTE) SARS-CoV-2 target nucleic acids are NOT DETECTED.  The SARS-CoV-2 RNA is generally detectable in upper respiratory specimens during the acute phase of infection. The lowest concentration of SARS-CoV-2 viral copies this assay can detect is 138 copies/mL. A negative result does not preclude SARS-Cov-2 infection and should not be used as the sole basis for treatment or other patient management decisions. A negative result may  occur with  improper specimen collection/handling, submission of specimen other than nasopharyngeal swab, presence of viral mutation(s) within the areas targeted by this assay, and inadequate number of viral copies(<138 copies/mL). A negative result must be combined with clinical observations, patient history, and epidemiological information. The expected result is Negative.  Fact Sheet for Patients:  BloggerCourse.com  Fact Sheet for Healthcare Providers:  SeriousBroker.it  This test is no t yet approved or cleared by the [place] FDA and  has been authorized for detection and/or diagnosis of SARS-CoV-2 by FDA under an Emergency Use Authorization (EUA). This EUA will remain  in effect (meaning this test can be used) for the duration of the COVID-19 declaration  under Section 564(b)(1) of the Act, 21 U.S.C.section 360bbb-3(b)(1), unless the authorization is terminated  or revoked sooner.       Influenza A by PCR NEGATIVE NEGATIVE Final   Influenza B by PCR NEGATIVE NEGATIVE Final    Comment: (NOTE) The Xpert Xpress SARS-CoV-2/FLU/RSV plus assay is intended as an aid in the diagnosis of influenza from Nasopharyngeal swab specimens and should not be used as a sole basis for treatment. Nasal washings and aspirates are unacceptable for Xpert Xpress SARS-CoV-2/FLU/RSV testing.  Fact Sheet for Patients: BloggerCourse.com  Fact Sheet for Healthcare Providers: SeriousBroker.it  This test is not yet approved or cleared by the [place] FDA and has been authorized for detection and/or diagnosis of SARS-CoV-2 by FDA under an Emergency Use Authorization (EUA). This EUA will remain in effect (meaning this test can be used) for the duration of the COVID-19 declaration under Section 564(b)(1) of the Act, 21 U.S.C. section 360bbb-3(b)(1), unless the authorization is terminated  or revoked.  Performed at [facility], [address], [place], [place] [address]   Surgical pcr screen     Status: None   Collection Time: 10/01/20  9:34 PM   Specimen: Nasal Mucosa; Nasal Swab  Result Value Ref Range Status   MRSA, PCR NEGATIVE NEGATIVE Final   Staphylococcus aureus NEGATIVE NEGATIVE Final    Comment: (NOTE) The Xpert SA Assay (FDA approved for NASAL specimens in patients 27 years of age and older), is one component of a comprehensive surveillance program. It is not intended to diagnose infection nor to guide or monitor treatment. Performed at [facility], [address] [place], [place] [address]     Radiology Studies: No results found.     Berry Gallacher T. Melicia Esqueda [facility]  If 7PM-7AM, please contact night-coverage [email or web address] 10/02/2020, 12:58 PM

## 2020-10-03 ENCOUNTER — Inpatient Hospital Stay (HOSPITAL_COMMUNITY): Payer: Medicare Other

## 2020-10-03 DIAGNOSIS — Q282 Arteriovenous malformation of cerebral vessels: Secondary | ICD-10-CM | POA: Diagnosis not present

## 2020-10-03 DIAGNOSIS — I629 Nontraumatic intracranial hemorrhage, unspecified: Secondary | ICD-10-CM | POA: Diagnosis not present

## 2020-10-03 DIAGNOSIS — I6523 Occlusion and stenosis of bilateral carotid arteries: Secondary | ICD-10-CM | POA: Diagnosis not present

## 2020-10-03 DIAGNOSIS — I2510 Atherosclerotic heart disease of native coronary artery without angina pectoris: Secondary | ICD-10-CM | POA: Diagnosis not present

## 2020-10-03 HISTORY — PX: IR ANGIO VERTEBRAL SEL SUBCLAVIAN INNOMINATE BILAT MOD SED: IMG5366

## 2020-10-03 LAB — RENAL FUNCTION PANEL
Albumin: 3.1 g/dL — ABNORMAL LOW (ref 3.5–5.0)
Anion gap: 6 (ref 5–15)
BUN: 11 mg/dL (ref 8–23)
CO2: 28 mmol/L (ref 22–32)
Calcium: 8.8 mg/dL — ABNORMAL LOW (ref 8.9–10.3)
Chloride: 100 mmol/L (ref 98–111)
Creatinine, Ser: 0.59 mg/dL — ABNORMAL LOW (ref 0.61–1.24)
GFR, Estimated: >60 mL/min (ref >60)
Glucose, Bld: 102 mg/dL — ABNORMAL HIGH (ref 70–99)
Phosphorus: 4.6 mg/dL (ref 2.5–4.6)
Potassium: 4 mmol/L (ref 3.5–5.1)
Sodium: 134 mmol/L — ABNORMAL LOW (ref 135–145)

## 2020-10-03 LAB — CBC
HCT: 41.7 % (ref 39.0–52.0)
Hemoglobin: 14 g/dL (ref 13.0–17.0)
MCH: 31.8 pg (ref 26.0–34.0)
MCHC: 33.6 g/dL (ref 30.0–36.0)
MCV: 94.8 fL (ref 80.0–100.0)
Platelets: 227 10*3/uL (ref 150–400)
RBC: 4.4 MIL/uL (ref 4.22–5.81)
RDW: 13.2 % (ref 11.5–15.5)
WBC: 5.5 10*3/uL (ref 4.0–10.5)
nRBC: 0 % (ref 0.0–0.2)

## 2020-10-03 LAB — MAGNESIUM: Magnesium: 1.9 mg/dL (ref 1.7–2.4)

## 2020-10-03 LAB — PROTIME-INR
INR: 1.2 (ref 0.8–1.2)
Prothrombin Time: 15.4 seconds — ABNORMAL HIGH (ref 11.4–15.2)

## 2020-10-03 MED ORDER — HEPARIN SODIUM (PORCINE) 1000 UNIT/ML IJ SOLN
INTRAMUSCULAR | Status: AC
  Administered 2020-10-03: 5000 [IU] via INTRA_ARTERIAL
  Filled 2020-10-03: qty 1

## 2020-10-03 MED ORDER — VERAPAMIL HCL 2.5 MG/ML IV SOLN
INTRAVENOUS | Status: AC
  Administered 2020-10-03: 5 mg via INTRA_ARTERIAL
  Filled 2020-10-03: qty 2

## 2020-10-03 MED ORDER — NITROGLYCERIN 1 MG/10 ML FOR IR/CATH LAB
INTRA_ARTERIAL | Status: AC
  Administered 2020-10-03: 300 mL via INTRA_ARTERIAL
  Filled 2020-10-03: qty 10

## 2020-10-03 MED ORDER — FENTANYL CITRATE (PF) 100 MCG/2ML IJ SOLN
INTRAMUSCULAR | Status: AC
  Filled 2020-10-03: qty 2

## 2020-10-03 MED ORDER — MIDAZOLAM HCL 2 MG/2ML IJ SOLN
INTRAMUSCULAR | Status: DC | PRN
  Administered 2020-10-03: 1 mg via INTRAVENOUS

## 2020-10-03 MED ORDER — FENTANYL CITRATE (PF) 100 MCG/2ML IJ SOLN
INTRAMUSCULAR | Status: DC | PRN
  Administered 2020-10-03: 25 ug via INTRAVENOUS

## 2020-10-03 MED ORDER — VERAPAMIL HCL 2.5 MG/ML IV SOLN: INTRA_ARTERIAL | Status: DC | PRN

## 2020-10-03 MED ORDER — IOHEXOL 240 MG/ML SOLN
50.0000 mL | Freq: Once | INTRAMUSCULAR | Status: AC | PRN
  Administered 2020-10-03: 30 mL via INTRAVENOUS

## 2020-10-03 MED ORDER — MIDAZOLAM HCL 2 MG/2ML IJ SOLN
INTRAMUSCULAR | Status: AC
  Filled 2020-10-03: qty 2

## 2020-10-03 MED ORDER — IOHEXOL 350 MG/ML SOLN
100.0000 mL | Freq: Once | INTRAVENOUS | Status: AC | PRN
  Administered 2020-10-03: 80 mL via INTRA_ARTERIAL

## 2020-10-03 MED ORDER — LIDOCAINE HCL 1 % IJ SOLN
INTRAMUSCULAR | Status: AC
  Administered 2020-10-03: 1 mL via INTRA_ARTERIAL
  Filled 2020-10-03: qty 20

## 2020-10-03 NOTE — Sedation Documentation (Signed)
Patient is resting comfortably. 

## 2020-10-03 NOTE — Sedation Documentation (Signed)
Vital signs stable. Procedure continues °

## 2020-10-03 NOTE — Plan of Care (Signed)

## 2020-10-03 NOTE — Sedation Documentation (Signed)
Vital signs stable. 

## 2020-10-03 NOTE — Progress Notes (Signed)
TR band removed after deflating it per order at 1630, site it intact dry and clean, no hematoma, pulse +3, full sensation, cold in nature. No acute distress noticed. Will continue to monitor.

## 2020-10-03 NOTE — Sedation Documentation (Signed)
Pt is awake alert and oriented x4. NPO, consent signed for procedure. Pt moved self safely to procedure table with minimal assistance. Pt has no complaints at this time. Prepping pt for procedure, vitals stable.

## 2020-10-03 NOTE — Discharge Instructions (Signed)
Call if persistent headache Call if neurological changes

## 2020-10-03 NOTE — Procedures (Signed)
INTERVENTIONAL NEURORADIOLOGY BRIEF POSTPROCEDURE NOTE   Diagnostic cerebral angiogram    Attending: Dr. Baldemar Lenis   Assistant: None.   Diagnosis: ICH.    Access site: Right radial artery.    Access closure: Inflatable band    Anesthesia: Moderate sedation    Complications: None.    Estimated blood loss: Negligible.    Specimen: None.    Findings: A prominently dilated, early draining vein is seen in the left occipital region, draining into the internal cerebral vein, consistent with shunting.  No clear nidus seen.  The venous flow is anterograde.   The patient tolerated the procedure well without incident or complication and is in stable condition.

## 2020-10-03 NOTE — Progress Notes (Signed)
PROGRESS NOTE  Samuel Barnes [facility] OVF:[id number] DOB: 07/30/1950   PCP: [name], PA  Patient is from: Home.  Independent at baseline.  DOA: 09/27/2020 LOS: 4  Chief complaints:  Chief Complaint  Patient presents with   Head Injury     Brief Narrative / Interim history: 70 year old M with PMH of chronic A. fib on Eliquis, HTN, CAD, diastolic CHF, COPD and BPH presenting with near syncope and fall, and admitted for near syncope and ICH.  Diagnosed with A. fib this summer and started on amiodarone, metoprolol and Eliquis.  Has been feeling lightheaded since he started taking those medications.  As a result, his metoprolol was decreased to 50 mg in the morning and 25 mg in the evening by his cardiologist but his lightheadedness did not improve and he had a fall backward with his neck eating glass tabletop without LOC.  CT head without contrast showed about 7 mm acute parenchymal bleed with surrounding edema within the left occipital lobe.  The next day, neurology and neurosurgery consulted.  MRI brain without contrast confirmed acute left occipital intraparenchymal hemorrhage.  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  MRI brain with contrast "highly suggestive for AVM and 7 mm rounded focus of enhancement suspicious for venous aneurysm".    Angiography planned for 9/26.  Remained stable for neuro standpoint   Subjective: Seen and examined earlier this morning.  No major events overnight of this morning.  His angiogram was postponed to today due to emergency.  He has no complaints.  Objective: Vitals:   10/03/20 1410 10/03/20 1415 10/03/20 1420 10/03/20 1425  BP: 105/72 105/79 112/76 120/76  Pulse: (!) 58 (!) 55 (!) 55 (!) 55  Resp: 15 14 11 17   Temp:      TempSrc:       SpO2: 91% 97% 96% 95%  Weight:      Height:        Intake/Output Summary (Last 24 hours) at 10/03/2020 1427 Last data filed at 10/02/2020 1608 Gross per 24 hour  Intake --  Output 250 ml  Net -250 ml   Filed Weights   09/27/20 1818  Weight: 86.6 kg    Examination:  GENERAL: No apparent distress.  Nontoxic. HEENT: MMM.  Vision and hearing grossly intact.  NECK: Supple.  No apparent JVD.  RESP: On RA.  No IWOB.  Fair aeration bilaterally. CVS:  RRR. Heart sounds normal.  ABD/GI/GU: BS+. Abd soft, NTND.  MSK/EXT:  Moves extremities. No apparent deformity. No edema.  SKIN: no apparent skin lesion or wound NEURO: Awake and alert. Oriented appropriately.  No apparent focal neuro deficit. PSYCH: Calm. Normal affect.   Procedures:  None  Microbiology summarized: COVID-19 and influenza PCR nonreactive.  Assessment & Plan: Near syncope/fall-temporal association with initiation of metoprolol for PAF in the setting of his underlying carotid artery stenosis suggest symptomatic carotid artery stenosis.  Neuro exam intact. -IR has been consulted for angiography -Need to get orthostatic vitals. -Continue reduced dose of metoprolol 25 mg twice daily -Orthostatic vitals -Fall precaution  Acute intracranial parenchymal hemorrhage-likely from the above.  Patient is on Eliquis for A. fib.  Neuro exam intact. -Continue holding Eliquis -Intracranial angiography today.  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  Persistent A. Fib-currently rate controlled. -  Continue amiodarone and reduced dose of metoprolol -Eliquis on hold in the setting of ICH  Chronic diastolic CHF: TTE 07/2020 with LVEF of 55 to 60%, RVSP of 36.5.  Not on diuretics at home.  Appears euvolemic on exam. -Monitor fluid status  History of CAD?  No anginal symptoms. -Continue statin  Essential hypertension: BP elevated.  Borderline bradycardia. -Continue  low-dose metoprolol as above -Start low-dose amlodipine.  He will be risk for AKI with ARB and angiography -IV hydralazine 5 mg every 4 hours as needed SBP> 150 or DBP> 100.  Chronic COPD: Stable -Incruse Ellipta and as needed nebulizers  BPH without LUTS -Monitor urine output  Body mass index is 28.21 kg/m.         DVT prophylaxis:  SCDs Start: 09/28/20 1553 Place and maintain sequential compression device Start: 09/28/20 1531  Code Status: Full code Family Communication: Patient and/or RN. Available if any question.  Level of care: Telemetry Medical Status is: Inpatient  Remains inpatient appropriate because:Ongoing diagnostic testing needed not appropriate for outpatient work up and Inpatient level of care appropriate due to severity of illness  Dispo: The patient is from: Home              Anticipated d/c is to: Home              Patient currently is not medically stable to d/c.   Difficult to place patient No       Consultants:  Neurology Neurosurgery Interventional radiology   Sch Meds:  Scheduled Meds:  amiodarone  200 mg Oral q AM   amLODipine  5 mg Oral Daily   fentaNYL       FLUoxetine  20 mg Oral QHS   melatonin  3 mg Oral QHS   metoprolol tartrate  25 mg Oral BID   midazolam       montelukast  10 mg Oral QHS   pravastatin  20 mg Oral q1800   terazosin  5 mg Oral Daily   thiamine  100 mg Oral Daily   umeclidinium bromide  1 puff Inhalation Daily   Continuous Infusions: PRN Meds:.acetaminophen **OR** acetaminophen, acetaminophen, albuterol, fentaNYL, hydrALAZINE, iohexol, midazolam, polyethylene glycol, Radial Cocktail (customizable)  Antimicrobials: Anti-infectives (From admission, onward)    None        I have personally reviewed the following labs and images: CBC: Recent Labs  Lab 09/27/20 1813 09/29/20 1248 10/03/20 0526  WBC 7.2 6.9 5.5  NEUTROABS 4.8  --   --   HGB 14.7 14.5 14.0  HCT 43.8 43.5 41.7  MCV 93.6 94.2 94.8   PLT 241 222 227   BMP &GFR Recent Labs  Lab 09/27/20 1813 09/29/20 1248 10/03/20 0526  NA 134* 132* 134*  K 4.0 4.0 4.0  CL 101 99 100  CO2 27 26 28   GLUCOSE 83 92 102*  BUN 16 13 11   CREATININE 0.70 0.57* 0.59*  CALCIUM 8.9 8.9 8.8*  MG  --   --  1.9  PHOS  --   --  4.6   Estimated Creatinine Clearance: 92.4 mL/min (A) (by C-G formula based on SCr of 0.59 mg/dL (L)). Liver & Pancreas: Recent Labs  Lab 09/27/20 1813 10/03/20 0526  AST 27  --   ALT 31  --   ALKPHOS 107  --   BILITOT 0.4  --   PROT 6.8  --   ALBUMIN 3.6 3.1*   No results for input(s): LIPASE, AMYLASE in the last 168 hours. No  results for input(s): AMMONIA in the last 168 hours. Diabetic: No results for input(s): HGBA1C in the last 72 hours. No results for input(s): GLUCAP in the last 168 hours. Cardiac Enzymes: No results for input(s): CKTOTAL, CKMB, CKMBINDEX, TROPONINI in the last 168 hours. No results for input(s): PROBNP in the last 8760 hours. Coagulation Profile: Recent Labs  Lab 09/27/20 1813  INR 1.3*   Thyroid Function Tests: No results for input(s): TSH, T4TOTAL, FREET4, T3FREE, THYROIDAB in the last 72 hours. Lipid Profile: No results for input(s): CHOL, HDL, LDLCALC, TRIG, CHOLHDL, LDLDIRECT in the last 72 hours. Anemia Panel: No results for input(s): VITAMINB12, FOLATE, FERRITIN, TIBC, IRON, RETICCTPCT in the last 72 hours. Urine analysis:    Component Value Date/Time   COLORURINE YELLOW 08/14/2020 1440   APPEARANCEUR HAZY (A) 08/14/2020 1440   LABSPEC 1.036 (H) 08/14/2020 1440   PHURINE 5.0 08/14/2020 1440   GLUCOSEU NEGATIVE 08/14/2020 1440   HGBUR NEGATIVE 08/14/2020 1440   BILIRUBINUR NEGATIVE 08/14/2020 1440   KETONESUR NEGATIVE 08/14/2020 1440   PROTEINUR NEGATIVE 08/14/2020 1440   NITRITE NEGATIVE 08/14/2020 1440   LEUKOCYTESUR NEGATIVE 08/14/2020 1440   Sepsis Labs: Invalid input(s): PROCALCITONIN, LACTICIDVEN  Microbiology: Recent Results (from the past 240  hour(s))  SARS Coronavirus 2 (TAT 6-24 hrs)     Status: None   Collection Time: 09/25/20 12:00 AM  Result Value Ref Range Status   SARS Coronavirus 2 RESULT: NEGATIVE  Final    Comment: RESULT: NEGATIVESARS-CoV-2 INTERPRETATION:A NEGATIVE  test result means that SARS-CoV-2 RNA was not present in the specimen above the limit of detection of this test. This does not preclude a possible SARS-CoV-2 infection and should not be used as the  sole basis for patient management decisions. Negative results must be combined with clinical observations, patient history, and epidemiological information. Optimum specimen types and timing for peak viral levels during infections caused by SARS-CoV-2  have not been determined. Collection of multiple specimens or types of specimens may be necessary to detect virus. Improper specimen collection and handling, sequence variability under primers/probes, or organism present below the limit of detection may  lead to false negative results. Positive and negative predictive values of testing are highly dependent on prevalence. False negative test results are more likely when prevalence of disease is high.The expected result is NEGATIVE.Fact S heet for  Healthcare Providers: CollegeCustoms.gl Sheet for Patients: https://poole-freeman.org/ Reference Range - Negative   Resp Panel by RT-PCR (Flu A&B, Covid) Nasopharyngeal Swab     Status: None   Collection Time: 09/27/20  6:13 PM   Specimen: Nasopharyngeal Swab; Nasopharyngeal(NP) swabs in vial transport medium  Result Value Ref Range Status   SARS Coronavirus 2 by RT PCR NEGATIVE NEGATIVE Final    Comment: (NOTE) SARS-CoV-2 target nucleic acids are NOT DETECTED.  The SARS-CoV-2 RNA is generally detectable in upper respiratory specimens during the acute phase of infection. The lowest concentration of SARS-CoV-2 viral copies this assay can detect is 138 copies/mL. A negative  result does not preclude SARS-Cov-2 infection and should not be used as the sole basis for treatment or other patient management decisions. A negative result may occur with  improper specimen collection/handling, submission of specimen other than nasopharyngeal swab, presence of viral mutation(s) within the areas targeted by this assay, and inadequate number of viral copies(<138 copies/mL). A negative result must be combined with clinical observations, patient history, and epidemiological information. The expected result is Negative.  Fact Sheet for Patients:  BloggerCourse.com  Fact Sheet for Healthcare  Providers:  SeriousBroker.it  This test is no t yet approved or cleared by the [place] and  has been authorized for detection and/or diagnosis of SARS-CoV-2 by FDA under an Emergency Use Authorization (EUA). This EUA will remain  in effect (meaning this test can be used) for the duration of the COVID-19 declaration under Section 564(b)(1) of the Act, 21 U.S.C.section 360bbb-3(b)(1), unless the authorization is terminated  or revoked sooner.       Influenza A by PCR NEGATIVE NEGATIVE Final   Influenza B by PCR NEGATIVE NEGATIVE Final    Comment: (NOTE) The Xpert Xpress SARS-CoV-2/FLU/RSV plus assay is intended as an aid in the diagnosis of influenza from Nasopharyngeal swab specimens and should not be used as a sole basis for treatment. Nasal washings and aspirates are unacceptable for Xpert Xpress SARS-CoV-2/FLU/RSV testing.  Fact Sheet for Patients: BloggerCourse.com  Fact Sheet for Healthcare Providers: SeriousBroker.it  This test is not yet approved or cleared by the [place] FDA and has been authorized for detection and/or diagnosis of SARS-CoV-2 by FDA under an Emergency Use Authorization (EUA). This EUA will remain in effect (meaning this test can be used)  for the duration of the COVID-19 declaration under Section 564(b)(1) of the Act, 21 U.S.C. section 360bbb-3(b)(1), unless the authorization is terminated or revoked.  Performed at [facility], [address], [place], [place] [address]   Surgical pcr screen     Status: None   Collection Time: 10/01/20  9:34 PM   Specimen: Nasal Mucosa; Nasal Swab  Result Value Ref Range Status   MRSA, PCR NEGATIVE NEGATIVE Final   Staphylococcus aureus NEGATIVE NEGATIVE Final    Comment: (NOTE) The Xpert SA Assay (FDA approved for NASAL specimens in patients 61 years of age and older), is one component of a comprehensive surveillance program. It is not intended to diagnose infection nor to guide or monitor treatment. Performed at [facility], [address], [place], [place] [address]     Radiology Studies: No results found.     Richelle Glick T. Lashaun Krapf [facility]  If 7PM-7AM, please contact night-coverage [email or web address] 10/03/2020, 2:27 PM

## 2020-10-03 NOTE — Progress Notes (Signed)
PT Cancellation Note  Patient Details Name: Samuel Barnes MRN: [id number] DOB: 05-04-1950   Cancelled Treatment:    Reason Eval/Treat Not Completed: (P) Patient at procedure or test/unavailable (Pt off unit (IR) will continue efforts per PT plan of care as schedule permits.)   Angus Palms 10/03/2020, 12:50 PM

## 2020-10-03 NOTE — TOC Transition Note (Signed)
Transition of Care [facility]) - CM/SW Discharge Note   Patient Details  Name: Samuel Barnes MRN: [id number] Date of Birth: 1950-12-22  [facility] CM/SW Contact:  Kermit Balo, RN Phone Number: 10/03/2020, 4:18 PM   Clinical Narrative:    Patient is discharging home with self care. No needs per PT.  Pt has transport home.   Final next level of care: Home/Self Care Barriers to Discharge: No Barriers Identified   Patient Goals and CMS Choice        Discharge Placement                       Discharge Plan and Services                                     Social Determinants of Health (SDOH) Interventions     Readmission Risk Interventions No flowsheet data found.

## 2020-10-03 NOTE — Sedation Documentation (Signed)
Vital signs stable. Procedure started 

## 2020-10-03 NOTE — Progress Notes (Signed)
Transported pt to 3W without difficulty. Right radial site level 0 at beside report with floor rn. Handoff complete.

## 2020-10-03 NOTE — Sedation Documentation (Signed)
Patient is resting comfortably. VSS, procedure continues 

## 2020-10-03 NOTE — Progress Notes (Signed)
Patient ID: Samuel Barnes, male   DOB: 10/21/1950, 70 y.o.   MRN: [id number] BP 122/70 (BP Location: Left Arm)   Pulse 64   Temp 97.8 F (36.6 C) (Oral)   Resp 18   Ht 5\' 9"  (1.753 m)   Wt 86.6 kg   SpO2 94%   BMI 28.21 kg/m  Alert and oriented x 4, speech is clear and fluent Moving all extremities well Ok for discharge tomorrow.  Will follow up in office approximately 6 weeks

## 2020-10-04 ENCOUNTER — Ambulatory Visit (HOSPITAL_COMMUNITY): Admission: RE | Admit: 2020-10-04 | Payer: Medicare Other | Source: Home / Self Care | Admitting: Cardiovascular Disease

## 2020-10-04 ENCOUNTER — Encounter (HOSPITAL_COMMUNITY): Admission: RE | Payer: Self-pay | Source: Home / Self Care

## 2020-10-04 ENCOUNTER — Other Ambulatory Visit: Payer: Self-pay | Admitting: Student

## 2020-10-04 DIAGNOSIS — R001 Bradycardia, unspecified: Secondary | ICD-10-CM | POA: Diagnosis not present

## 2020-10-04 DIAGNOSIS — I10 Essential (primary) hypertension: Secondary | ICD-10-CM

## 2020-10-04 DIAGNOSIS — I480 Paroxysmal atrial fibrillation: Secondary | ICD-10-CM

## 2020-10-04 DIAGNOSIS — I629 Nontraumatic intracranial hemorrhage, unspecified: Secondary | ICD-10-CM | POA: Diagnosis not present

## 2020-10-04 HISTORY — PX: IR ANGIO INTRA EXTRACRAN SEL COM CAROTID INNOMINATE BILAT MOD SED: IMG5360

## 2020-10-04 HISTORY — PX: IR US GUIDE VASC ACCESS RIGHT: IMG2390

## 2020-10-04 LAB — RENAL FUNCTION PANEL
Albumin: 3.1 g/dL — ABNORMAL LOW (ref 3.5–5.0)
Anion gap: 6 (ref 5–15)
BUN: 11 mg/dL (ref 8–23)
CO2: 27 mmol/L (ref 22–32)
Calcium: 8.6 mg/dL — ABNORMAL LOW (ref 8.9–10.3)
Chloride: 100 mmol/L (ref 98–111)
Creatinine, Ser: 0.65 mg/dL (ref 0.61–1.24)
GFR, Estimated: >60 mL/min (ref >60)
Glucose, Bld: 122 mg/dL — ABNORMAL HIGH (ref 70–99)
Phosphorus: 4.5 mg/dL (ref 2.5–4.6)
Potassium: 4.1 mmol/L (ref 3.5–5.1)
Sodium: 133 mmol/L — ABNORMAL LOW (ref 135–145)

## 2020-10-04 LAB — CBC
HCT: 42 % (ref 39.0–52.0)
Hemoglobin: 13.8 g/dL (ref 13.0–17.0)
MCH: 31.3 pg (ref 26.0–34.0)
MCHC: 32.9 g/dL (ref 30.0–36.0)
MCV: 95.2 fL (ref 80.0–100.0)
Platelets: 214 10*3/uL (ref 150–400)
RBC: 4.41 MIL/uL (ref 4.22–5.81)
RDW: 13.2 % (ref 11.5–15.5)
WBC: 5.8 10*3/uL (ref 4.0–10.5)
nRBC: 0 % (ref 0.0–0.2)

## 2020-10-04 LAB — MAGNESIUM: Magnesium: 2 mg/dL (ref 1.7–2.4)

## 2020-10-04 SURGERY — CARDIOVERSION
Anesthesia: General

## 2020-10-04 MED ORDER — METOPROLOL SUCCINATE ER 25 MG PO TB24: 25.0000 mg | ORAL_TABLET | Freq: Every day | ORAL | 0 refills | Status: DC

## 2020-10-04 MED ORDER — AMLODIPINE BESYLATE 5 MG PO TABS: 5.0000 mg | ORAL_TABLET | Freq: Every day | ORAL | 1 refills | Status: DC

## 2020-10-04 NOTE — Plan of Care (Signed)

## 2020-10-04 NOTE — TOC Transition Note (Signed)
Transition of Care [facility]) - CM/SW Discharge Note   Patient Details  Name: KYRESE GARTMAN MRN: [id number] Date of Birth: August 22, 1950  Transition of Care [facility]) CM/SW Contact:  Kermit Balo, RN Phone Number: 10/04/2020, 11:50 AM   Clinical Narrative:    Patient was active with [facility] RN, PT prior to admission. Daughter would like this continued and PT today is in agreement. [facility] aware of resumption orders. Pt has transportation home.    Final next level of care: Home w Home Health Services Barriers to Discharge: No Barriers Identified   Patient Goals and CMS Choice   CMS Medicare.gov Compare Post Acute Care list provided to:: Patient Represenative (must comment) Choice offered to / list presented to : Adult Children  Discharge Placement                       Discharge Plan and Services                          HH Arranged: PT, RN [facility] Agency: [facility] Date [facility] Agency Contacted: 10/04/20   Representative spoke with at [facility] Agency: Kandee Keen  Social Determinants of Health (SDOH) Interventions     Readmission Risk Interventions No flowsheet data found.

## 2020-10-04 NOTE — Progress Notes (Signed)
Physical Therapy Treatment Patient Details Name: Samuel Barnes MRN: [id number] DOB: 1950/12/05 Today's Date: 10/04/2020   History of Present Illness 70 y.o. male admitted on 09/27/20 after falling and hitting his head and dx with acute intracranial parenchymal hemorrhage: MRI brain confirms acute left occipital intraparenchymal hemorrhage with surrounding edema. Pt with significant PMH of aortic atherosclerosis, HTN, COPD, A-fib on Eliquis, diastolic CHF.    PT Comments    Pt received in supine, agreeable to therapy session and with good participation and tolerance for greater than household distance gait trial with RW and stair training. Pt scored 12/24 on Dynamic Gait Index and slow gait speed of 0.21 m/s indicates an increased risk of falls for household distance gait tasks. Scores of 19 or less on DGI are predictive of falls in older community living adults. Pt needing min guard to Supervision for gait and transfers and with some decreased safety awareness overall but receptive to instruction. Pt continues to benefit from PT services to progress toward functional mobility goals. Discharge recommendation updated to include HHPT per discussion with pt and supervising PT Corinna Capra.  Recommendations for follow up therapy are one component of a multi-disciplinary discharge planning process, led by the attending physician.  Recommendations may be updated based on patient status, additional functional criteria and insurance authorization.  Follow Up Recommendations  Supervision - Intermittent;Home health PT (pt and daughter requesting HHPT, pt would benefit)     Equipment Recommendations  None recommended by PT (pt already has RW and shower seat)    Recommendations for Other Services       Precautions / Restrictions Precautions Precautions: Fall Precaution Comments: h/o falls/syncopal events Restrictions Weight Bearing Restrictions: No     Mobility  Bed Mobility Overal bed  mobility: Needs Assistance Bed Mobility: Supine to Sit;Sit to Supine     Supine to sit: Modified independent (Device/Increase time) Sit to supine: Modified independent (Device/Increase time)   General bed mobility comments: use of bed features    Transfers Overall transfer level: Needs assistance Equipment used: Rolling walker (2 wheeled) Transfers: Sit to/from Stand Sit to Stand: Min guard         General transfer comment: min guard assist for safety cues for safe hand placement. pt abandoned RW prior to turning and sitting, enouraged him to keep RW with him until seated for safety; good hand placement  Ambulation/Gait Ambulation/Gait assistance: Min guard;Supervision Gait Distance (Feet): 300 Feet Assistive device: Rolling walker (2 wheeled) Gait Pattern/deviations: Step-through pattern;Antalgic;Decreased stride length;Decreased dorsiflexion - right;Decreased dorsiflexion - left;Wide base of support Gait velocity: 0.21 m/s or 0.69 ft/sec Gait velocity interpretation: <1.31 ft/sec, indicative of household ambulator General Gait Details: Pt with decreased B step length and mild bilateral LE external rotation, tends to "waddle" slightly; pt holding RW too far advanced needs safety cues for proximity to device with fair carryover; see DGI   Stairs Stairs: Yes Stairs assistance: Min guard Stair Management: Two rails;Step to pattern;Alternating pattern;Forwards Number of Stairs: 2 General stair comments: pt ascended two 7" steps and descended three 4" steps with min guard for safety and needed cues for safety/sequencing but pt with only fair carryover of cues. pt reports he avoids steps at baseline   Wheelchair Mobility    Modified Rankin (Stroke Patients Only)       Balance Overall balance assessment: History of Falls;Needs assistance Sitting-balance support: Single extremity supported;Feet supported;No upper extremity supported Sitting balance-Leahy Scale: Fair Sitting  balance - Comments: static sitting and weight shifting no LOB  Standing balance support: Bilateral upper extremity supported;During functional activity Standing balance-Leahy Scale: Fair Standing balance comment: LOB with challenges for stepping over glove box and steps but otherwise fair balance using RW; unsteady without UE support                 Standardized Balance Assessment Standardized Balance Assessment : Dynamic Gait Index   Dynamic Gait Index Level Surface: Mild Impairment Change in Gait Speed: Moderate Impairment Gait with Horizontal Head Turns: Mild Impairment Gait with Vertical Head Turns: Mild Impairment Gait and Pivot Turn: Mild Impairment Step Over Obstacle: Moderate Impairment Step Around Obstacles: Moderate Impairment Steps: Moderate Impairment Total Score: 12      Cognition Arousal/Alertness: Awake/alert Behavior During Therapy: WFL for tasks assessed/performed Overall Cognitive Status: History of cognitive impairments - at baseline                                 General Comments: per daughter some dementia, but mild. Currently at his baseline.      Exercises      General Comments General comments (skin integrity, edema, etc.): VSS on RA per chart review, pt without acute s/sx distress throughout      Pertinent Vitals/Pain Pain Assessment: No/denies pain    Home Living                      Prior Function            PT Goals (current goals can now be found in the care plan section) Acute Rehab PT Goals Patient Stated Goal: to go home PT Goal Formulation: With patient/family Time For Goal Achievement: 10/13/20 Potential to Achieve Goals: Good Progress towards PT goals: Progressing toward goals    Frequency    Min 3X/week      PT Plan Discharge plan needs to be updated (pt and family requesting HHPT)    Co-evaluation              AM-PAC PT "6 Clicks" Mobility   Outcome Measure  Help needed  turning from your back to your side while in a flat bed without using bedrails?: None Help needed moving from lying on your back to sitting on the side of a flat bed without using bedrails?: None Help needed moving to and from a bed to a chair (including a wheelchair)?: A Little Help needed standing up from a chair using your arms (e.g., wheelchair or bedside chair)?: A Little Help needed to walk in hospital room?: A Little Help needed climbing 3-5 steps with a railing? : A Lot 6 Click Score: 19    End of Session Equipment Utilized During Treatment: Gait belt Activity Tolerance: Patient tolerated treatment well Patient left: in bed;with call bell/phone within reach;with bed alarm set (seated EOB planning to get dressed) Nurse Communication: Mobility status;Other (comment) (pt requesting HHPT) PT Visit Diagnosis: Difficulty in walking, not elsewhere classified (R26.2)     Time: 7209-4709 PT Time Calculation (min) (ACUTE ONLY): 15 min  Charges:  $Gait Training: 8-22 mins                     Nello Corro P., PTA Acute Rehabilitation Services Pager: [phone] Office: [phone]    Dorathy Kinsman Kaysen Deal 10/04/2020, 11:25 AM

## 2020-10-04 NOTE — Progress Notes (Signed)
Noted that there is an a free text instruction from his cardiologist office advising patient to take his Toprol-XL 25 mg with 50 mg for a total dose of 75 mg.  I have removed the free instruction and sent a new prescription to his pharmacy.  I have called and notified patient about this change.  He voiced understanding and appreciated the call.

## 2020-10-04 NOTE — Discharge Summary (Signed)
Physician Discharge Summary  Samuel Barnes:[id number] DOB: [birth year] DOA: 09/27/2020  PCP: Samuel Cleverly, PA  Admit date: 09/27/2020 Discharge date: 10/04/2020 Admitted From: Home. Disposition: Home Recommendations for Outpatient Follow-up:  Follow ups as below. Please obtain CBC/BMP/Mag at follow up Please follow up on the following pending results: None Home Health: PT Equipment/Devices: Patient has rolling walker Discharge Condition: Stable CODE STATUS: Full code  Follow-up Information     Samuel Memos, MD Follow up in 6 week(s).   Specialty: Neurosurgery Why: please call the office to make an appointment Contact information: [address] [place] [address] [phone]         Samuel Barnes, [place]. Schedule an appointment as soon as possible for a visit in 1 week(s).   Specialty: General Practice Contact information: [address] [place] [address] [phone]         [name], MD .   Specialty: Cardiology Contact information: [phone] [address] [place] [address] [phone]         Care, [facility] Follow up.   Specialty: Home Health Services Why: The home health agency will call you for the next home visit. Contact information: [address] [place] [address] [phone]                Hospital Course: 70 year old M with PMH of chronic A. fib on Eliquis, HTN, CAD, diastolic CHF, COPD and BPH presenting with near syncope and fall, and admitted for near syncope and ICH.   Diagnosed with A. fib this summer and started on amiodarone, metoprolol and Eliquis.  Has been feeling lightheaded since he started taking those medications.  As a result, his metoprolol was decreased to 50 mg in the morning and 25 mg in the evening by his cardiologist but his lightheadedness did not improve and he had a fall backward with his neck eating glass tabletop without LOC.  CT head  without contrast showed about 7 mm acute parenchymal bleed with surrounding edema within the left occipital lobe.   The next day, neurology and neurosurgery consulted.  MRI brain without contrast confirmed acute left occipital intraparenchymal hemorrhage.  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  MRI brain with contrast "highly suggestive for AVM and 7 mm rounded focus of enhancement suspicious for venous aneurysm".     Patient underwent intracranial angiography that showed "a prominently dilated, early draining vein in the left occipital region, draining into the internal cerebral vein, consistent with shunting.  No clear liters seen.  The pain is flow is anterograde.".  He is cleared for discharge by neurosurgery for outpatient follow-up in 6 weeks.  Per neurosurgery, Dr. Franky Macho okay to resume Eliquis on discharge.  See individual problem list below for more on hospital course.  Discharge Diagnoses:  Near syncope/fall-wonder if this could from bradycardia due to Toprol-XL.  He was on 100 mg that was recently reduced to 75 mg. His HR has been in the low 60s on metoprolol 25 mg twice daily here.  Orthostatic vitals negative. -Discharged on reduced dose of Toprol-XL 25 mg daily -Outpatient follow-up with cardiology -[facility] PT ordered.   Acute intracranial parenchymal hemorrhage-likely from the above.  Imaging and angiography as above. Intracranial shunting-noted on angiography -Outpatient  follow-up with neurosurgery in 6 weeks -Okay to resume Eliquis on discharge per neurosurgery   Persistent A. Fib-currently rate controlled.  HR in low 60s -Continue amiodarone and Toprol-XL as above -Resumed Eliquis as above -Further discussion about anticoagulation with his cardiologist   Chronic  diastolic CHF: TTE 07/2020 with LVEF of 55 to 60%, RVSP of 36.5.  Not on diuretics at home.  Appears euvolemic on exam. -Monitor fluid status   History of CAD?  No anginal symptoms. -Continue statin   Essential hypertension: BP elevated.  Borderline bradycardia. -Reduce metoprolol as above. -Started low-dose amlodipine -Start low-dose amlodipine.  He will be risk for AKI with ARB and angiography -IV hydralazine 5 mg every 4 hours as needed SBP> 150 or DBP> 100.   Chronic COPD: Stable -Incruse Ellipta and as needed nebulizers   BPH without LUTS -Monitor urine output   Body mass index is 28.21 kg/m.           Discharge Exam: Vitals:   10/04/20 0808 10/04/20 0815 10/04/20 0955 10/04/20 1010  BP:  129/77  133/86  Pulse: 70 (!) 54 64 65  Temp:  97.9 F (36.6 C)    Resp: 18 18    Height:      Weight:      SpO2: 95% 97%    TempSrc:  Oral    BMI (Calculated):         GENERAL: No apparent distress.  Nontoxic. HEENT: MMM.  Vision and hearing grossly intact.  NECK: Supple.  No apparent JVD.  RESP:  No IWOB.  Fair aeration bilaterally. CVS: HR 58.  Heart sounds normal.  ABD/GI/GU: Bowel sounds present. Soft. Non tender.  MSK/EXT:  Moves extremities. No apparent deformity. No edema.  SKIN: no apparent skin lesion or wound NEURO: Awake and alert.  Oriented appropriately.  No apparent focal neuro deficit. PSYCH: Calm. Normal affect.   Discharge Instructions  Discharge Instructions     Call MD for:  difficulty breathing, headache or visual disturbances   Complete by: As directed    Diet - low sodium heart healthy   Complete by: As directed    Discharge instructions   Complete by: As directed    It has been a pleasure taking care of you!  You were hospitalized after you nearly passed out.  The test is we have done suggested that you might have abnormal blood vessel.  The neurologist recommended outpatient follow-up in 6 weeks.  Please review your new medication list and  the directions on your medications before you take them. May follow-up with your primary care doctor and cardiologist in 1 to 2 weeks or sooner if needed.   Take care,   Increase activity slowly   Complete by: As directed    No wound care   Complete by: As directed       Allergies as of 10/04/2020       Reactions   Ciprofloxacin Shortness Of Breath, Other (See Comments)   Numbness in extremities   Gabapentin Other (See Comments)   Mental problems, depression, anger        Medication List     STOP taking these medications    metoprolol succinate 100 MG 24 hr tablet Commonly known as: TOPROL-XL       TAKE these medications    acetaminophen 500 MG tablet Commonly known as: TYLENOL Take 1,000 mg by mouth every 6 (six) hours as needed (pain.).   albuterol 108 (90 Base) MCG/ACT inhaler Commonly known as:  VENTOLIN HFA Inhale 2 puffs into the lungs every 4 (four) hours as needed for wheezing or shortness of breath.   amiodarone 200 MG tablet Commonly known as: PACERONE Take 1 tablet (200 mg) by mouth twice daily till 08/17/2020 and then take 1 tablet (200 mg) once daily from 08/18/2020 onwards as per cardiology What changed:  how much to take how to take this when to take this   amLODipine 5 MG tablet Commonly known as: NORVASC Take 1 tablet (5 mg total) by mouth daily.   clotrimazole 1 % cream Commonly known as: LOTRIMIN Apply 1 application topically 2 (two) times daily as needed (jock itch/groin discomfort).   Eliquis 5 MG Tabs tablet Generic drug: apixaban Take 1 tablet (5 mg total) by mouth 2 (two) times daily.   FLUoxetine 20 MG capsule Commonly known as: PROZAC Take 20 mg by mouth at bedtime.   folic acid 1 MG tablet Commonly known as: FOLVITE Take 1 tablet (1 mg total) by mouth daily.   lovastatin 20 MG tablet Commonly known as: MEVACOR Take 20 mg by mouth daily.   montelukast 10 MG tablet Commonly known as: SINGULAIR Take 10 mg by mouth  daily.   multivitamin with minerals Tabs tablet Take 1 tablet by mouth daily.   polyethylene glycol 17 g packet Commonly known as: MIRALAX / GLYCOLAX Take 17 g by mouth daily as needed for mild constipation. What changed: when to take this   terazosin 5 MG capsule Commonly known as: HYTRIN Take 5 mg by mouth daily.   thiamine 100 MG tablet Take 1 tablet (100 mg total) by mouth daily.   tiotropium 18 MCG inhalation capsule Commonly known as: SPIRIVA Place 1 capsule (18 mcg total) into inhaler and inhale daily.        Consultations: Neurosurgery Interventional radiology  Procedures/Studies: Intracranial angiography   CT ANGIO HEAD NECK W WO CM  Result Date: 09/29/2020 CLINICAL DATA:  Stroke, follow-up EXAM: CT ANGIOGRAPHY HEAD AND NECK TECHNIQUE: Multidetector CT imaging of the head and neck was performed using the standard protocol during bolus administration of intravenous contrast. Multiplanar CT image reconstructions and MIPs were obtained to evaluate the vascular anatomy. Carotid stenosis measurements (when applicable) are obtained utilizing NASCET criteria, using the distal internal carotid diameter as the denominator. CONTRAST:  75mL OMNIPAQUE IOHEXOL 350 MG/ML SOLN COMPARISON:  Recent CT and MR imaging FINDINGS: CT HEAD Brain: Small area of recent hemorrhage is again identified in the left occipital lobe. Slightly increased adjacent edema. No significant mass effect. No new hemorrhage. Vascular: There is atherosclerotic calcification at the skull base. Skull: Calvarium is unremarkable. Sinuses/Orbits: No acute finding. Other: None. Review of the MIP images confirms the above findings CTA NECK Aortic arch: Mixed plaque along the arch and great vessel origins. Right carotid system: Patent. Mixed plaque along the common carotid causing nearly 50% stenosis. Calcified plaque along the proximal internal carotid causing less than 50% stenosis. Left carotid system: Patent. Mixed but  primarily calcified plaque along the common carotid causing less than 50% stenosis. Heavy calcified plaque at the bifurcation causing 65% stenosis. Calcified plaque along the proximal internal carotid causing less than 50% stenosis. Vertebral arteries: Patent. Right vertebral artery slightly dominant. Calcified plaque at the right vertebral origin causing at least moderate stenosis. Skeleton: Degenerative changes of the included spine. Other neck: Unremarkable. Upper chest: Right pleural effusion. Review of the MIP images confirms the above findings CTA HEAD Anterior circulation: Intracranial internal carotid arteries are patent with calcified plaque  causing mild stenosis. Anterior and middle cerebral arteries are patent. Posterior circulation: Intracranial vertebral arteries are patent with mild calcified plaque on the left. Basilar artery is patent. Major cerebellar artery origins are patent. Posterior cerebral arteries are patent. Left posterior communicating artery is the primary supply of the left PCA. Venous sinuses: Patent as allowed by contrast bolus timing. There is a prominent draining vein in the left occipital lobe in proximity to the area of hemorrhage. There is no tangle of vessels to suggest a nidus. Review of the MIP images confirms the above findings IMPRESSION: Similar small area of recent hemorrhage in the left occipital lobe with slightly increased surrounding edema. Prominent draining vein in the left occipital lobe in proximity area of hemorrhage. No evidence of a nidus. 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 Otherwise, NIR consultation suggested to evaluate utility of catheter angiography. Plaque along the right common carotid causing nearly 50% stenosis. Heavily calcified plaque just below the left common carotid bifurcation causing 65% stenosis. Less than 50% stenosis along the proximal internal carotids. Calcified  plaque at the right vertebral origin causing at least moderate stenosis. Electronically Signed   By: Guadlupe Spanish M.D.   On: 09/29/2020 14:49   CT HEAD WO CONTRAST ( )  Result Date: 09/27/2020 CLINICAL DATA:  Status post trauma. EXAM: CT HEAD WITHOUT CONTRAST TECHNIQUE: Contiguous axial images were obtained from the base of the skull through the vertex without intravenous contrast. COMPARISON:  August 14, 2020 FINDINGS: Brain: There is mild cerebral atrophy with widening of the extra-axial spaces and ventricular dilatation. There are areas of decreased attenuation within the white matter tracts of the supratentorial brain, consistent with microvascular disease changes. A 7 mm hyperdense focus is seen within the subcortical white matter of the left occipital lobe. A mild amount of surrounding white matter low attenuation is seen. A mild amount of mass effect is noted on the adjacent sulci. There is no evidence of midline shift. Vascular: No hyperdense vessel or unexpected calcification. Skull: Normal. Negative for fracture or focal lesion. Sinuses/Orbits: Very mild bilateral ethmoid sinus mucosal thickening is seen. Other: None. IMPRESSION: 1. 7 mm acute parenchymal bleed and surrounding edema within the left occipital lobe. MRI correlation is recommended. 2. Generalized cerebral atrophy. Electronically Signed   By: Aram Candela M.D.   On: 09/27/2020 17:52   MR BRAIN WO CONTRAST  Result Date: 09/28/2020 EXAM: MRI HEAD WITHOUT CONTRAST TECHNIQUE: Multiplanar, multiecho pulse sequences of the brain and surrounding structures were obtained without intravenous contrast. COMPARISON:  CT head 09/27/2020.  MRI 08/16/2020. FINDINGS: Brain: Acute hemorrhage in the left occipital lobe with associated T1 hyperintensity and susceptibility artifact. When comparing across modalities, the size of the hemorrhage is likely similar. There is surrounding edema without significant mass effect. No surrounding restricted  diffusion to suggest acute infarct. In the region of hemorrhage there is a tubular T2 hypointense flow void, compatible with prominent vessel that courses anterolaterally towards the left lateral ventricle (for example, see series 5, image 12). No midline shift, hydrocephalus, or extra-axial fluid collection. Additional mild scattered T2 hyperintensities within the white matter, nonspecific but compatible with chronic microvascular ischemic disease. Vascular: Major arterial flow voids are maintained at the skull base. Flow void in the region of the left occipital hemorrhage is detailed above. Skull and upper cervical spine: Normal marrow signal. Sinuses/Orbits: Clear sinuses.  No acute orbital findings. Other: No mastoid effusions. IMPRESSION: 1. Acute left occipital  intraparenchymal hemorrhage with surrounding edema. There is a prominent vessel in this region, which courses towards the left lateral ventricle. Recommend postcontrast MRI and CTA to further evaluate and to exclude a vascular malformation as a cause of the hemorrhage. 2. No surrounding restricted diffusion to suggest acute infarct. Electronically Signed   By: Feliberto Harts M.D.   On: 09/28/2020 13:48   MR BRAIN W CONTRAST  Result Date: 09/29/2020 CLINICAL DATA:  Neuro deficit, acute, stroke suspected. EXAM: MRI HEAD WITH CONTRAST TECHNIQUE: Multiplanar, multiecho pulse sequences of the brain and surrounding structures were obtained with intravenous contrast. CONTRAST:  82mL GADAVIST GADOBUTROL 1 MMOL/ML IV SOLN COMPARISON:  CT angiogram head/neck 09/29/2020. Brain MRI 09/28/2020. Noncontrast head CT 09/27/2020. FINDINGS: Brain: A contrast-enhanced brain MRI was performed as a follow-up to the recent noncontrast brain MRI of 09/28/2020. On today's examination, the following sequences were acquired: Axial precontrast T1 weighted sequence, axial postcontrast T1 weighted sequence, coronal T1 weighted postcontrast sequence. The coronal T1 weighted  postcontrast sequence is mildly motion degraded. Grossly unchanged size of a small focus of recent parenchymal hemorrhage within the left occipital lobe. Adjacent parenchymal edema also does not appear significantly changed. Redemonstrated abnormal prominent venous vessel within the left occipital region coursing in close proximity to the parenchymal hemorrhage. There is an associated 7 mm rounded focus of enhancement along the course of this vessel, immediately adjacent to the parenchymal hemorrhage, suspicious for a venous aneurysm (series 8, image 23). No abnormal intracranial enhancement is identified elsewhere. Vascular: Enhancement within the proximal large arterial vessels. Abnormal prominent venous vessel in the left occipital region, as described above. Skull and upper cervical spine: No appreciable focal suspicious marrow lesion. Sinuses/Orbits: Mild bilateral ethmoid sinus mucosal thickening. IMPRESSION: Grossly unchanged small focus of recent parenchymal hemorrhage within the left occipital lobe with surrounding parenchymal edema. Redemonstrated abnormal prominent venous vessel within the left occipital region, coursing in close proximity to the parenchymal hemorrhage. This constellation of findings is highly suggestive of an underlying arteriovenous malformation (AVM). Additionally, there is a 7 mm rounded focus of enhancement along the course of the described abnormal vessel, immediately adjacent to the parenchymal hemorrhage, suspicious for a venous aneurysm. Catheter-based angiography is recommended for further evaluation. Electronically Signed   By: Jackey Loge D.O.   On: 09/29/2020 15:10   DG Knee Complete 4 Views Left  Result Date: 09/27/2020 CLINICAL DATA:  Status post fall. EXAM: LEFT KNEE - COMPLETE 4+ VIEW COMPARISON:  None. FINDINGS: No evidence of fracture, dislocation, or joint effusion. Mild medial and lateral tibiofemoral compartment space narrowing is seen. Moderate to marked  severity vascular calcification is seen. IMPRESSION: 1. No acute osseous abnormality. Electronically Signed   By: Aram Candela M.D.   On: 09/27/2020 17:54       The results of significant diagnostics from this hospitalization (including imaging, microbiology, ancillary and laboratory) are listed below for reference.     Microbiology: Recent Results (from the past 240 hour(s))  SARS Coronavirus 2 (TAT 6-24 hrs)     Status: None   Collection Time: 09/25/20 12:00 AM  Result Value Ref Range Status   SARS Coronavirus 2 RESULT: NEGATIVE  Final    Comment: RESULT: NEGATIVESARS-CoV-2 INTERPRETATION:A NEGATIVE  test result means that SARS-CoV-2 RNA was not present in the specimen above the limit of detection of this test. This does not preclude a possible SARS-CoV-2 infection and should not be used as the  sole basis for patient management decisions. Negative results must be combined with  clinical observations, patient history, and epidemiological information. Optimum specimen types and timing for peak viral levels during infections caused by SARS-CoV-2  have not been determined. Collection of multiple specimens or types of specimens may be necessary to detect virus. Improper specimen collection and handling, sequence variability under primers/probes, or organism present below the limit of detection may  lead to false negative results. Positive and negative predictive values of testing are highly dependent on prevalence. False negative test results are more likely when prevalence of disease is high.The expected result is NEGATIVE.Fact S heet for  Healthcare Providers: CollegeCustoms.gl Sheet for Patients: https://poole-freeman.org/ Reference Range - Negative   Resp Panel by RT-PCR (Flu A&B, Covid) Nasopharyngeal Swab     Status: None   Collection Time: 09/27/20  6:13 PM   Specimen: Nasopharyngeal Swab; Nasopharyngeal(NP) swabs in vial transport  medium  Result Value Ref Range Status   SARS Coronavirus 2 by RT PCR NEGATIVE NEGATIVE Final    Comment: (NOTE) SARS-CoV-2 target nucleic acids are NOT DETECTED.  The SARS-CoV-2 RNA is generally detectable in upper respiratory specimens during the acute phase of infection. The lowest concentration of SARS-CoV-2 viral copies this assay can detect is 138 copies/mL. A negative result does not preclude SARS-Cov-2 infection and should not be used as the sole basis for treatment or other patient management decisions. A negative result may occur with  improper specimen collection/handling, submission of specimen other than nasopharyngeal swab, presence of viral mutation(s) within the areas targeted by this assay, and inadequate number of viral copies(<138 copies/mL). A negative result must be combined with clinical observations, patient history, and epidemiological information. The expected result is Negative.  Fact Sheet for Patients:  BloggerCourse.com  Fact Sheet for Healthcare Providers:  SeriousBroker.it  This test is no t yet approved or cleared by the [place] FDA and  has been authorized for detection and/or diagnosis of SARS-CoV-2 by FDA under an Emergency Use Authorization (EUA). This EUA will remain  in effect (meaning this test can be used) for the duration of the COVID-19 declaration under Section 564(b)(1) of the Act, 21 U.S.C.section 360bbb-3(b)(1), unless the authorization is terminated  or revoked sooner.       Influenza A by PCR NEGATIVE NEGATIVE Final   Influenza B by PCR NEGATIVE NEGATIVE Final    Comment: (NOTE) The Xpert Xpress SARS-CoV-2/FLU/RSV plus assay is intended as an aid in the diagnosis of influenza from Nasopharyngeal swab specimens and should not be used as a sole basis for treatment. Nasal washings and aspirates are unacceptable for Xpert Xpress SARS-CoV-2/FLU/RSV testing.  Fact Sheet for  Patients: BloggerCourse.com  Fact Sheet for Healthcare Providers: SeriousBroker.it  This test is not yet approved or cleared by the [place] FDA and has been authorized for detection and/or diagnosis of SARS-CoV-2 by FDA under an Emergency Use Authorization (EUA). This EUA will remain in effect (meaning this test can be used) for the duration of the COVID-19 declaration under Section 564(b)(1) of the Act, 21 U.S.C. section 360bbb-3(b)(1), unless the authorization is terminated or revoked.  Performed at [facility], [address], [place], [place] [address]   Surgical pcr screen     Status: None   Collection Time: 10/01/20  9:34 PM   Specimen: Nasal Mucosa; Nasal Swab  Result Value Ref Range Status   MRSA, PCR NEGATIVE NEGATIVE Final   Staphylococcus aureus NEGATIVE NEGATIVE Final    Comment: (NOTE) The Xpert SA Assay (FDA approved for NASAL specimens in patients 22 years  of age and older), is one component of a comprehensive surveillance program. It is not intended to diagnose infection nor to guide or monitor treatment. Performed at [facility], [address] [place], [place] [address]      Labs:  CBC: Recent Labs  Lab 09/27/20 1813 09/29/20 1248 10/03/20 0526 10/04/20 0329  WBC 7.2 6.9 5.5 5.8  NEUTROABS 4.8  --   --   --   HGB 14.7 14.5 14.0 13.8  HCT 43.8 43.5 41.7 42.0  MCV 93.6 94.2 94.8 95.2  PLT 241 222 227 214   BMP &GFR Recent Labs  Lab 09/27/20 1813 09/29/20 1248 10/03/20 0526 10/04/20 0329  NA 134* 132* 134* 133*  K 4.0 4.0 4.0 4.1  CL 101 99 100 100  CO2 GLUCOSE 83 92 102* 122*  BUN CREATININE 0.70 0.57* 0.59* 0.65  CALCIUM 8.9 8.9 8.8* 8.6*  MG  --   --  1.9 2.0  PHOS  --   --  4.6 4.5   Estimated Creatinine Clearance: 92.4 mL/min (by C-G formula based on SCr of 0.65 mg/dL). Liver & Pancreas: Recent Labs  Lab 09/27/20 1813  10/03/20 0526 10/04/20 0329  AST 27  --   --   ALT 31  --   --   ALKPHOS 107  --   --   BILITOT 0.4  --   --   PROT 6.8  --   --   ALBUMIN 3.6 3.1* 3.1*   No results for input(s): LIPASE, AMYLASE in the last 168 hours. No results for input(s): AMMONIA in the last 168 hours. Diabetic: No results for input(s): HGBA1C in the last 72 hours. No results for input(s): GLUCAP in the last 168 hours. Cardiac Enzymes: No results for input(s): CKTOTAL, CKMB, CKMBINDEX, TROPONINI in the last 168 hours. No results for input(s): PROBNP in the last 8760 hours. Coagulation Profile: Recent Labs  Lab 09/27/20 1813 10/03/20 1633  INR 1.3* 1.2   Thyroid Function Tests: No results for input(s): TSH, T4TOTAL, FREET4, T3FREE, THYROIDAB in the last 72 hours. Lipid Profile: No results for input(s): CHOL, HDL, LDLCALC, TRIG, CHOLHDL, LDLDIRECT in the last 72 hours. Anemia Panel: No results for input(s): VITAMINB12, FOLATE, FERRITIN, TIBC, IRON, RETICCTPCT in the last 72 hours. Urine analysis:    Component Value Date/Time   COLORURINE YELLOW 08/14/2020 1440   APPEARANCEUR HAZY (A) 08/14/2020 1440   LABSPEC 1.036 (H) 08/14/2020 1440   PHURINE 5.0 08/14/2020 1440   GLUCOSEU NEGATIVE 08/14/2020 1440   HGBUR NEGATIVE 08/14/2020 1440   BILIRUBINUR NEGATIVE 08/14/2020 1440   KETONESUR NEGATIVE 08/14/2020 1440   PROTEINUR NEGATIVE 08/14/2020 1440   NITRITE NEGATIVE 08/14/2020 1440   LEUKOCYTESUR NEGATIVE 08/14/2020 1440   Sepsis Labs: Invalid input(s): PROCALCITONIN, LACTICIDVEN   Time coordinating discharge: 45 minutes  SIGNED:  Almon Hercules, MD  [facility] 10/04/2020, 4:49 PM

## 2020-10-05 ENCOUNTER — Encounter (HOSPITAL_COMMUNITY): Payer: Self-pay

## 2020-10-06 ENCOUNTER — Telehealth: Payer: Self-pay | Admitting: Family

## 2020-10-06 NOTE — Telephone Encounter (Signed)
Daughter wanted to call in about medication changes that took place and verify that Luther Parody was in agreement.  Added back Amlodipine, reduced metoprolol, and started back eliquis after a fall with bleed.  Will forward to Gillian Shields and Dr. Mayford Knife for advisement

## 2020-10-06 NOTE — Telephone Encounter (Signed)
   Pt's daughter calling, she is requesting to speak with a nurse regarding pt's medication, she said while pt was in the hospital the doctor changed it

## 2020-10-06 NOTE — Telephone Encounter (Signed)
[name], MD  Agree with med changes - please check BP and HR twice daily for a week and call with results  Gave daughter recommendation. Verbalized understanding and agreement.

## 2020-10-18 ENCOUNTER — Ambulatory Visit (HOSPITAL_BASED_OUTPATIENT_CLINIC_OR_DEPARTMENT_OTHER): Payer: Medicare Other | Admitting: Family

## 2020-10-18 ENCOUNTER — Encounter (HOSPITAL_BASED_OUTPATIENT_CLINIC_OR_DEPARTMENT_OTHER): Payer: Self-pay | Admitting: Family

## 2020-10-18 ENCOUNTER — Other Ambulatory Visit: Payer: Self-pay

## 2020-10-18 VITALS — BP 122/56 | HR 72 | Ht 69.0 in | Wt 199.0 lb

## 2020-10-18 DIAGNOSIS — I2510 Atherosclerotic heart disease of native coronary artery without angina pectoris: Secondary | ICD-10-CM

## 2020-10-18 DIAGNOSIS — E785 Hyperlipidemia, unspecified: Secondary | ICD-10-CM | POA: Diagnosis not present

## 2020-10-18 DIAGNOSIS — Z7901 Long term (current) use of anticoagulants: Secondary | ICD-10-CM | POA: Diagnosis not present

## 2020-10-18 DIAGNOSIS — I480 Paroxysmal atrial fibrillation: Secondary | ICD-10-CM | POA: Diagnosis not present

## 2020-10-18 MED ORDER — FUROSEMIDE 20 MG PO TABS: ORAL_TABLET | ORAL | 0 refills | Status: DC

## 2020-10-18 NOTE — Patient Instructions (Signed)
Medication Instructions:  Your physician has recommended you make the following change in your medication:   START Lasix 20mg  daily for 3 days.  This will help get rid of the small amount of fluid you have on board.   If you miss a dose of Eliquis please call and let know as we will need to reschedule your cardioversion.   *If you need a refill on your cardiac medications before your next appointment, please call your pharmacy*   Lab Work: Your physician recommends that you return for lab work 10/24/20-10/27/20 for BMP and CBC.   If you have labs (blood work) drawn today and your tests are completely normal, you will receive your results only by: MyChart Message (if you have MyChart) OR A paper copy in the mail If you have any lab test that is abnormal or we need to change your treatment, we will call you to review the results.   Testing/Procedures: Your physician has recommended that you have a Cardioversion (DCCV). Electrical Cardioversion uses a jolt of electricity to your heart either through paddles or wired patches attached to your chest. This is a controlled, usually prescheduled, procedure. Defibrillation is done under light anesthesia in the hospital, and you usually go home the day of the procedure. This is done to get your heart back into a normal rhythm. You are not awake for the procedure. Please see the instruction sheet given to you today.  Follow-Up: At [facility], you and your health needs are our priority.  As part of our continuing mission to provide you with exceptional heart care, we have created designated Provider Care Teams.  These Care Teams include your primary Cardiologist (physician) and Advanced Practice Providers (APPs -  Physician Assistants and Nurse Practitioners) who all work together to provide you with the care you need, when you need it.  We recommend signing up for the patient portal called "MyChart".  Sign up information is provided on this  After Visit Summary.  MyChart is used to connect with patients for Virtual Visits (Telemedicine).  Patients are able to view lab/test results, encounter notes, upcoming appointments, etc.  Non-urgent messages can be sent to your provider as well.   To learn more about what you can do with MyChart, go to [facility].    Your next appointment:   2-3 weeks after cardioversion  The format for your next appointment:   In Person  Provider:   You may see [email or web address], MD or one of the following Advanced Practice Providers on your designated Care Team:   Armanda Magic, PA-C Ronie Spies, PA-C Jacolyn Reedy, NP   Other Instructions  You are scheduled for a Cardioversion on 10/31/20 with Dr. 11/02/20.  Please arrive at the [facility] (Main Entrance A) at [facility]: [address] [place], [place] at 8 am.  DIET: Nothing to eat or drink after midnight except a sip of water with medications (see medication instructions below)  FYI: For your safety, and to allow [id number] to monitor your vital signs accurately during the surgery/procedure we request that   if you have artificial nails, gel coating, SNS etc. Please have those removed prior to your surgery/procedure. Not having the nail coverings /polish removed may result in cancellation or delay of your surgery/procedure.   Medication Instructions: Continue your anticoagulant: Eliquis  You will need to continue your anticoagulant after your procedure until you are told by your provider that it is safe to stop  Labs: 10/24/20-10/27/20 for BMP, CBC at [employer] must have a responsible person to drive you home and stay in the waiting area during your procedure. Failure to do so could result in cancellation.  Bring your insurance cards.  *Special Note: Every effort is made to have your procedure done on time. Occasionally there are emergencies that occur at the hospital that may cause delays. Please be patient if a  delay does occur.

## 2020-10-18 NOTE — H&P (View-Only) (Signed)
Office Visit    Patient Name: Samuel Barnes Date of Encounter: 10/18/2020  PCP:  [name], PA   [facility]  Cardiologist:  Armanda Magic, MD  Advanced Practice Provider:  No care team member to display Electrophysiologist:  None    Chief Complaint    Samuel Barnes is a 70 y.o. male with a hx of CHF, chronic hypoxic respiratory failure on 3L O2, atrial fibrillation on Eliquis, BPH, CAD, HLD, tobacco use, obesity, depression, alcohol abuse presents today for hospital follow up  Past Medical History    Past Medical History:  Diagnosis Date   Aortic atherosclerosis (HCC) 07/28/2020   BPH (benign prostatic hyperplasia)    Class 2 obesity 07/28/2020   Coronary artery calcification 07/28/2020   Depression    Family history of adverse reaction to anesthesia    Hyperlipidemia 07/28/2020   Hypertension    Tobacco use 07/28/2020   Past Surgical History:  Procedure Laterality Date   CARDIOVERSION N/A 08/02/2020   Procedure: CARDIOVERSION;  Surgeon: Meriam Sprague, MD;  Location: [facility] ENDOSCOPY;  Service: Cardiovascular;  Laterality: N/A;   IR ANGIO INTRA EXTRACRAN SEL COM CAROTID INNOMINATE BILAT MOD SED  10/04/2020   IR ANGIO VERTEBRAL SEL SUBCLAVIAN INNOMINATE BILAT MOD SED  10/03/2020   IR THORACENTESIS ASP PLEURAL SPACE W/IMG GUIDE  07/31/2020   IR US GUIDE VASC ACCESS RIGHT  10/04/2020   TEE WITHOUT CARDIOVERSION N/A 08/02/2020   Procedure: TRANSESOPHAGEAL ECHOCARDIOGRAM (TEE);  Surgeon: Meriam Sprague, MD;  Location: [facility] ENDOSCOPY;  Service: Cardiovascular;  Laterality: N/A;   TRANSURETHRAL RESECTION OF PROSTATE      Allergies  Allergies  Allergen Reactions   Ciprofloxacin Shortness Of Breath and Other (See Comments)    Numbness in extremities    Gabapentin Other (See Comments)    Mental problems, depression, anger    History of Present Illness    Samuel Barnes is a 70 y.o. male with a hx of CHF, chronic  hypoxic respiratory failure on 3L O2, atrial fibrillation on Eliquis, BPH, CAD, HLD, tobacco use, obesity, depression, alcohol abuse last seen 09/25/2020  Admitted 07/28/2020 - 08/08/2020 with acute on chronic diastolic heart failure, paroxysmal new onset atrial fibrillation, and pleural effusion.  No prior cardiac imaging or ischemic testing coronary artery calcifications on previous CT scan from 2019.  Underwent right thoracentesis centesis.TEE 08/02/20 LVEF 55-60%, no left atrial appendage thrombus. DCCV subsequently performed. Only brief restoration of NSR. He was diuresed with IV Lasix 9 litersand discharged on p.o. Lasix.  He was admitted 08/14/20-08/21/20 with Wernicke's encephalopathy, syncope, metabolic encephalopathy. CT/MRI brain unremarkable. Family noted cognitive decline for past few weeks.  08/30/20 labs via Care Everywhere: NA 133, AST 38, ALT 78, alkaline phosphatase 158, creatinine 0.69, GFR greater than 90 Hemoglobin 15.4, hematocrit 46.7, platelets 314 B12 486, PSA 2.3, Hemoglobin A1c 5.9  89/19/22.  Noted not sleeping well.  Chest tightness had resolved.  He has been mostly sedentary.  He noted lightheadedness with sensation of things being "wavy ".  He was set up for cardioversion and Toprol reduced.  Admitted 09/27/20-10/04/20 after near syncope and fall. He was diagnosed with ICH. CT head with 26mm acute parenchymal bleed with surrounding edema within left ocipital lobe. 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  Underwent angiography showing "a prominently dilated, early draining vein in the left occipital region, draining into the internal cerebral vein, consistent with  shunting.  No clear liters seen.  The pain is flow is anterograde." He was cleared to resume Eliquis on discharge. Given bradycardia, Toprol further reduced to 25mg  QD. Low dose Amlodipine was added for blood pressure control.   Presents today for follow  up with his daughter. He has a consult for cataracts next week with Dr. . Notes no recurrent headedness, dizziness, falls.  No chest pain, pressure, tightness.  Tells me his dyspnea on exertion is stable at baseline.  He has upcoming neurosurgery appointment 11/15/2018 reviewed with Dr. 13/08/2018 and tells me Dr. Kathaleen Grinder is planning for intervention in the future but was nonurgent.  He has not missed any doses of his Eliquis.  EKGs/Labs/Other Studies Reviewed:   The following studies were reviewed today:   ECHO:  TEE 08/02/20   1. Left ventricular ejection fraction, by estimation, is 55 to 60%. The  left ventricle has normal function.   2. Right ventricular systolic function is normal. The right ventricular  size is normal.   3. Left atrial size was mild to moderately dilated. No left atrial/left  atrial appendage thrombus was detected.   4. Right atrial size was moderately dilated.   5. The mitral valve is normal in structure. Mild mitral valve  regurgitation. No evidence of mitral stenosis.   6. The aortic valve is tricuspid. There is mild calcification of the  aortic valve. There is mild thickening of the aortic valve. Aortic valve  regurgitation is not visualized. Mild aortic valve sclerosis is present,  with no evidence of aortic valve  stenosis.   7. There is Moderate (Grade III) plaque.   8. Successful DCCV performed (200J x1) after TEE with return to NSR.  Please see separate note.   EKG: No EKG today.  EKG independently reviewed from 09/25/2020 demonstrated rate controlled atrial fibrillation rate 61 bpm with no acute St/T wave changes.   Recent Labs: 07/28/2020: TSH 1.920 08/14/2020: B Natriuretic Peptide 135.2 09/27/2020: ALT 31 10/04/2020: BUN 11; Creatinine, Ser 0.65; Hemoglobin 13.8; Magnesium 2.0; Platelets 214; Potassium 4.1; Sodium 133  Recent Lipid Panel    Component Value Date/Time   CHOL 73 07/30/2020 0555   TRIG 29 07/30/2020 0555   HDL 34 (L) 07/30/2020 0555    CHOLHDL 2.1 07/30/2020 0555   VLDL 6 07/30/2020 0555   LDLCALC 33 07/30/2020 0555    Home Medications   Current Meds  Medication Sig   acetaminophen (TYLENOL) 500 MG tablet Take 1,000 mg by mouth every 6 (six) hours as needed (pain.).   amiodarone (PACERONE) 200 MG tablet Take 1 tablet (200 mg) by mouth twice daily till 08/17/2020 and then take 1 tablet (200 mg) once daily from 08/18/2020 onwards as per cardiology (Patient taking differently: Take 200 mg by mouth in the morning. Take 1 tablet (200 mg) by mouth twice daily till 08/17/2020 and then take 1 tablet (200 mg) once daily from 08/18/2020 onwards as per cardiology)   amLODipine (NORVASC) 5 MG tablet Take 1 tablet (5 mg total) by mouth daily.   apixaban (ELIQUIS) 5 MG TABS tablet Take 1 tablet (5 mg total) by mouth 2 (two) times daily.   clotrimazole (LOTRIMIN) 1 % cream Apply 1 application topically 2 (two) times daily as needed (jock itch/groin discomfort).   FLUoxetine (PROZAC) 20 MG capsule Take 20 mg by mouth at bedtime.   folic acid (FOLVITE) 1 MG tablet Take 1 tablet (1 mg total) by mouth daily.   ketoconazole (NIZORAL) 2 % cream Apply topically 2 (  two) times daily.   lovastatin (MEVACOR) 20 MG tablet Take 20 mg by mouth daily.   metoprolol succinate (TOPROL XL) 25 MG 24 hr tablet Take 1 tablet (25 mg total) by mouth daily.   montelukast (SINGULAIR) 10 MG tablet Take 10 mg by mouth daily.   Multiple Vitamin (MULTIVITAMIN WITH MINERALS) TABS tablet Take 1 tablet by mouth daily.   polyethylene glycol (MIRALAX / GLYCOLAX) 17 g packet Take 17 g by mouth daily as needed for mild constipation. (Patient taking differently: Take 17 g by mouth daily.)   terazosin (HYTRIN) 5 MG capsule Take 5 mg by mouth daily.   thiamine 100 MG tablet Take 1 tablet (100 mg total) by mouth daily.     Review of Systems      All other systems reviewed and are otherwise negative except as noted above.  Physical Exam    VS:  BP (!) 104/50 Comment: 106/60  in left arm  Pulse 72   Ht 5\' 9"  (1.753 m)   Wt 199 lb (90.3 kg)   SpO2 97%   BMI 29.39 kg/m  , BMI Body mass index is 29.39 kg/m.  Wt Readings from Last 3 Encounters:  10/18/20 199 lb (90.3 kg)  09/27/20 191 lb (86.6 kg)  09/25/20 192 lb 9.6 oz (87.4 kg)    GEN: Well nourished, well developed, in no acute distress. HEENT: normal. Neck: Supple, no JVD, carotid bruits, or masses. Cardiac: irregularly irregular, no murmurs, rubs, or gallops. No clubbing, cyanosis, edema.  Radials/PT 2+ and equal bilaterally.  Respiratory:  Respirations regular and unlabored, clear to auscultation bilaterally. GI: Soft, nontender, nondistended. MS: No deformity or atrophy. Skin: Warm and dry, no rash. Neuro:  Strength and sensation are intact. Psych: Normal affect.  Assessment & Plan    Wernicke's encephaolopathy - Recent admission. Mentation improving on folate and thiamine. Upcoming appointment to establish with neurology.   PAF / Chronic anticoagulation - CHAD2VASC of 4.  Had TEE/DCCV during admission 08/2020 but maintained NSR for only 2 days.  Repeat cardioversion was scheduled but canceled due to a fall and ICH.  He was cleared to resume Eliquis by neurology.  Discussed need for 3 weeks of uninterrupted anticoagulation prior to cardioversion.  This has been scheduled for 10/31/2020.  Will require at least 4 weeks of anticoagulation postprocedure prior to interrupting for any procedures.  Reports no missed doses of Eliquis.  Continue Toprol 25 mg daily.  Continue Amiodarone 200mg  QD. No evidence of toxicity.  Shared Decision Making/Informed Consent{ The risks (stroke, cardiac arrhythmias rarely resulting in the need for a temporary or permanent pacemaker, skin irritation or burns and complications associated with conscious sedation including aspiration, arrhythmia, respiratory failure and death), benefits (restoration of normal sinus rhythm) and alternatives of a direct current cardioversion were  explained in detail to Mr. Zulueta and he agrees to proceed.    HTN - BP well controlled in clinic today.  No recurrent orthostasis.  Continue amlodipine 5 mg daily, metoprolol 25 mg daily.Encouraged to bring home monitor to assess accuracy.  HLD, LDL goal <70 - 7/24 22 LDL 33. Continue Lovastatin 20mg  daily.   HFpEF - Echo 07/2020 normal LVEF, gr2DD.  Weight up 7 pounds over 1 month.  Will treat with Lasix 20 mg daily for 3 days.  Low salt diet and fluid <2L restriction encouraged.  Coronary artery calcification on CT - Stable with no anginal symptoms. No indication for ischemic evaluation. GDMT includes metoprolol, lovastatin. No aspirin due to chronic  anticoagulation. Heart healthy diet and regular cardiovascular exercise encouraged.    Tobacco use / ETOH use - Has completely abstained from etoh and tobacco since discharge and congratulated.  Disposition: Follow up 2-3 weeks after cardioversion with Dr. Turner or APP.  Signed, Shawnie Nicole S Jeremiah Tarpley, NP 10/18/2020, 10:20 AM [facility]  

## 2020-10-18 NOTE — Progress Notes (Signed)
Office Visit    Patient Name: DEWON MENDIZABAL Date of Encounter: 10/18/2020  PCP:  [name], PA   [facility]  Cardiologist:  Armanda Magic, MD  Advanced Practice Provider:  No care team member to display Electrophysiologist:  None    Chief Complaint    Samuel Barnes is a 70 y.o. male with a hx of CHF, chronic hypoxic respiratory failure on 3L O2, atrial fibrillation on Eliquis, BPH, CAD, HLD, tobacco use, obesity, depression, alcohol abuse presents today for hospital follow up  Past Medical History    Past Medical History:  Diagnosis Date   Aortic atherosclerosis (HCC) 07/28/2020   BPH (benign prostatic hyperplasia)    Class 2 obesity 07/28/2020   Coronary artery calcification 07/28/2020   Depression    Family history of adverse reaction to anesthesia    Hyperlipidemia 07/28/2020   Hypertension    Tobacco use 07/28/2020   Past Surgical History:  Procedure Laterality Date   CARDIOVERSION N/A 08/02/2020   Procedure: CARDIOVERSION;  Surgeon: Meriam Sprague, MD;  Location: [facility] ENDOSCOPY;  Service: Cardiovascular;  Laterality: N/A;   IR ANGIO INTRA EXTRACRAN SEL COM CAROTID INNOMINATE BILAT MOD SED  10/04/2020   IR ANGIO VERTEBRAL SEL SUBCLAVIAN INNOMINATE BILAT MOD SED  10/03/2020   IR THORACENTESIS ASP PLEURAL SPACE W/IMG GUIDE  07/31/2020   IR US GUIDE VASC ACCESS RIGHT  10/04/2020   TEE WITHOUT CARDIOVERSION N/A 08/02/2020   Procedure: TRANSESOPHAGEAL ECHOCARDIOGRAM (TEE);  Surgeon: Meriam Sprague, MD;  Location: [facility] ENDOSCOPY;  Service: Cardiovascular;  Laterality: N/A;   TRANSURETHRAL RESECTION OF PROSTATE      Allergies  Allergies  Allergen Reactions   Ciprofloxacin Shortness Of Breath and Other (See Comments)    Numbness in extremities    Gabapentin Other (See Comments)    Mental problems, depression, anger    History of Present Illness    Samuel Barnes is a 70 y.o. male with a hx of CHF, chronic  hypoxic respiratory failure on 3L O2, atrial fibrillation on Eliquis, BPH, CAD, HLD, tobacco use, obesity, depression, alcohol abuse last seen 09/25/2020  Admitted 07/28/2020 - 08/08/2020 with acute on chronic diastolic heart failure, paroxysmal new onset atrial fibrillation, and pleural effusion.  No prior cardiac imaging or ischemic testing coronary artery calcifications on previous CT scan from 2019.  Underwent right thoracentesis centesis.TEE 08/02/20 LVEF 55-60%, no left atrial appendage thrombus. DCCV subsequently performed. Only brief restoration of NSR. He was diuresed with IV Lasix 9 litersand discharged on p.o. Lasix.  He was admitted 08/14/20-08/21/20 with Wernicke's encephalopathy, syncope, metabolic encephalopathy. CT/MRI brain unremarkable. Family noted cognitive decline for past few weeks.  08/30/20 labs via Care Everywhere: NA 133, AST 38, ALT 78, alkaline phosphatase 158, creatinine 0.69, GFR greater than 90 Hemoglobin 15.4, hematocrit 46.7, platelets 314 B12 486, PSA 2.3, Hemoglobin A1c 5.9  89/19/22.  Noted not sleeping well.  Chest tightness had resolved.  He has been mostly sedentary.  He noted lightheadedness with sensation of things being "wavy ".  He was set up for cardioversion and Toprol reduced.  Admitted 09/27/20-10/04/20 after near syncope and fall. He was diagnosed with ICH. CT head with 26mm acute parenchymal bleed with surrounding edema within left ocipital lobe. 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  Underwent angiography showing "a prominently dilated, early draining vein in the left occipital region, draining into the internal cerebral vein, consistent with  shunting.  No clear liters seen.  The pain is flow is anterograde." He was cleared to resume Eliquis on discharge. Given bradycardia, Toprol further reduced to 25mg  QD. Low dose Amlodipine was added for blood pressure control.   Presents today for follow  up with his daughter. He has a consult for cataracts next week with Dr. . Notes no recurrent headedness, dizziness, falls.  No chest pain, pressure, tightness.  Tells me his dyspnea on exertion is stable at baseline.  He has upcoming neurosurgery appointment 11/15/2018 reviewed with Dr. 13/08/2018 and tells me Dr. Kathaleen Grinder is planning for intervention in the future but was nonurgent.  He has not missed any doses of his Eliquis.  EKGs/Labs/Other Studies Reviewed:   The following studies were reviewed today:   ECHO:  TEE 08/02/20   1. Left ventricular ejection fraction, by estimation, is 55 to 60%. The  left ventricle has normal function.   2. Right ventricular systolic function is normal. The right ventricular  size is normal.   3. Left atrial size was mild to moderately dilated. No left atrial/left  atrial appendage thrombus was detected.   4. Right atrial size was moderately dilated.   5. The mitral valve is normal in structure. Mild mitral valve  regurgitation. No evidence of mitral stenosis.   6. The aortic valve is tricuspid. There is mild calcification of the  aortic valve. There is mild thickening of the aortic valve. Aortic valve  regurgitation is not visualized. Mild aortic valve sclerosis is present,  with no evidence of aortic valve  stenosis.   7. There is Moderate (Grade III) plaque.   8. Successful DCCV performed (200J x1) after TEE with return to NSR.  Please see separate note.   EKG: No EKG today.  EKG independently reviewed from 09/25/2020 demonstrated rate controlled atrial fibrillation rate 61 bpm with no acute St/T wave changes.   Recent Labs: 07/28/2020: TSH 1.920 08/14/2020: B Natriuretic Peptide 135.2 09/27/2020: ALT 31 10/04/2020: BUN 11; Creatinine, Ser 0.65; Hemoglobin 13.8; Magnesium 2.0; Platelets 214; Potassium 4.1; Sodium 133  Recent Lipid Panel    Component Value Date/Time   CHOL 73 07/30/2020 0555   TRIG 29 07/30/2020 0555   HDL 34 (L) 07/30/2020 0555    CHOLHDL 2.1 07/30/2020 0555   VLDL 6 07/30/2020 0555   LDLCALC 33 07/30/2020 0555    Home Medications   Current Meds  Medication Sig   acetaminophen (TYLENOL) 500 MG tablet Take 1,000 mg by mouth every 6 (six) hours as needed (pain.).   amiodarone (PACERONE) 200 MG tablet Take 1 tablet (200 mg) by mouth twice daily till 08/17/2020 and then take 1 tablet (200 mg) once daily from 08/18/2020 onwards as per cardiology (Patient taking differently: Take 200 mg by mouth in the morning. Take 1 tablet (200 mg) by mouth twice daily till 08/17/2020 and then take 1 tablet (200 mg) once daily from 08/18/2020 onwards as per cardiology)   amLODipine (NORVASC) 5 MG tablet Take 1 tablet (5 mg total) by mouth daily.   apixaban (ELIQUIS) 5 MG TABS tablet Take 1 tablet (5 mg total) by mouth 2 (two) times daily.   clotrimazole (LOTRIMIN) 1 % cream Apply 1 application topically 2 (two) times daily as needed (jock itch/groin discomfort).   FLUoxetine (PROZAC) 20 MG capsule Take 20 mg by mouth at bedtime.   folic acid (FOLVITE) 1 MG tablet Take 1 tablet (1 mg total) by mouth daily.   ketoconazole (NIZORAL) 2 % cream Apply topically 2 (  two) times daily.   lovastatin (MEVACOR) 20 MG tablet Take 20 mg by mouth daily.   metoprolol succinate (TOPROL XL) 25 MG 24 hr tablet Take 1 tablet (25 mg total) by mouth daily.   montelukast (SINGULAIR) 10 MG tablet Take 10 mg by mouth daily.   Multiple Vitamin (MULTIVITAMIN WITH MINERALS) TABS tablet Take 1 tablet by mouth daily.   polyethylene glycol (MIRALAX / GLYCOLAX) 17 g packet Take 17 g by mouth daily as needed for mild constipation. (Patient taking differently: Take 17 g by mouth daily.)   terazosin (HYTRIN) 5 MG capsule Take 5 mg by mouth daily.   thiamine 100 MG tablet Take 1 tablet (100 mg total) by mouth daily.     Review of Systems      All other systems reviewed and are otherwise negative except as noted above.  Physical Exam    VS:  BP (!) 104/50 Comment: 106/60  in left arm  Pulse 72   Ht 5\' 9"  (1.753 m)   Wt 199 lb (90.3 kg)   SpO2 97%   BMI 29.39 kg/m  , BMI Body mass index is 29.39 kg/m.  Wt Readings from Last 3 Encounters:  10/18/20 199 lb (90.3 kg)  09/27/20 191 lb (86.6 kg)  09/25/20 192 lb 9.6 oz (87.4 kg)    GEN: Well nourished, well developed, in no acute distress. HEENT: normal. Neck: Supple, no JVD, carotid bruits, or masses. Cardiac: irregularly irregular, no murmurs, rubs, or gallops. No clubbing, cyanosis, edema.  Radials/PT 2+ and equal bilaterally.  Respiratory:  Respirations regular and unlabored, clear to auscultation bilaterally. GI: Soft, nontender, nondistended. MS: No deformity or atrophy. Skin: Warm and dry, no rash. Neuro:  Strength and sensation are intact. Psych: Normal affect.  Assessment & Plan    Wernicke's encephaolopathy - Recent admission. Mentation improving on folate and thiamine. Upcoming appointment to establish with neurology.   PAF / Chronic anticoagulation - CHAD2VASC of 4.  Had TEE/DCCV during admission 08/2020 but maintained NSR for only 2 days.  Repeat cardioversion was scheduled but canceled due to a fall and ICH.  He was cleared to resume Eliquis by neurology.  Discussed need for 3 weeks of uninterrupted anticoagulation prior to cardioversion.  This has been scheduled for 10/31/2020.  Will require at least 4 weeks of anticoagulation postprocedure prior to interrupting for any procedures.  Reports no missed doses of Eliquis.  Continue Toprol 25 mg daily.  Continue Amiodarone 200mg  QD. No evidence of toxicity.  Shared Decision Making/Informed Consent{ The risks (stroke, cardiac arrhythmias rarely resulting in the need for a temporary or permanent pacemaker, skin irritation or burns and complications associated with conscious sedation including aspiration, arrhythmia, respiratory failure and death), benefits (restoration of normal sinus rhythm) and alternatives of a direct current cardioversion were  explained in detail to Mr. Zulueta and he agrees to proceed.    HTN - BP well controlled in clinic today.  No recurrent orthostasis.  Continue amlodipine 5 mg daily, metoprolol 25 mg daily.Encouraged to bring home monitor to assess accuracy.  HLD, LDL goal <70 - 7/24 22 LDL 33. Continue Lovastatin 20mg  daily.   HFpEF - Echo 07/2020 normal LVEF, gr2DD.  Weight up 7 pounds over 1 month.  Will treat with Lasix 20 mg daily for 3 days.  Low salt diet and fluid <2L restriction encouraged.  Coronary artery calcification on CT - Stable with no anginal symptoms. No indication for ischemic evaluation. GDMT includes metoprolol, lovastatin. No aspirin due to chronic  anticoagulation. Heart healthy diet and regular cardiovascular exercise encouraged.    Tobacco use / ETOH use - Has completely abstained from etoh and tobacco since discharge and congratulated.  Disposition: Follow up 2-3 weeks after cardioversion with Dr. Mayford Knife or APP.  Signed, Alver Sorrow, NP 10/18/2020, 10:20 AM [facility]

## 2020-10-20 ENCOUNTER — Encounter (HOSPITAL_COMMUNITY): Payer: Self-pay | Admitting: Cardiology

## 2020-10-25 ENCOUNTER — Ambulatory Visit: Payer: Medicare Other | Admitting: Neurology

## 2020-10-25 ENCOUNTER — Other Ambulatory Visit: Payer: Self-pay

## 2020-10-25 ENCOUNTER — Encounter: Payer: Self-pay | Admitting: Neurology

## 2020-10-25 VITALS — BP 123/72 | HR 73 | Ht 69.0 in | Wt 199.0 lb

## 2020-10-25 DIAGNOSIS — G3184 Mild cognitive impairment, so stated: Secondary | ICD-10-CM

## 2020-10-25 MED ORDER — RIVASTIGMINE TARTRATE 1.5 MG PO CAPS: ORAL_CAPSULE | ORAL | 0 refills | Status: DC

## 2020-10-25 MED ORDER — B-12 1000 MCG PO TABS: 1000.0000 ug | ORAL_TABLET | Freq: Every day | ORAL | 0 refills | Status: AC

## 2020-10-25 NOTE — Progress Notes (Signed)
[facility]  PATIENT: Samuel Barnes DOB: October 13, 1950  REFERRING CLINICIAN: [name], PA HISTORY FROM: Patient and Daughter REASON FOR VISIT: Worsening memory    HISTORICAL  CHIEF COMPLAINT:  Chief Complaint  Patient presents with   New Patient (Initial Visit)    Room 16, with daughter, c/o low energy, and memory decline      HISTORY OF PRESENT ILLNESS:  This is a 70 year old man with past medical history of hypertension, hyperlipidemia, depression, alcohol abuse in remission, coronary artery disease and BPH who is presenting with worsening memory.  History provided by patient and daughter.  Per daughter patient lives by himself, he has memory problem described as difficulty remembering things, losing track of time, sometime getting confused about the date, difficulty keeping up with his appointment, not paying his bills on time.  Daughter feels like it takes longer for him to do things as previously.  Previously his sleep was poor, he will get maybe 3 to 4 hours of sleep per night, sometimes he will wake up and wander around the house but not outside the home. He was also noted to give out his personal information, SSN, trying to get a loan to buy a new house, which are out of characters for him per daughter.  Patient also states that he had difficulty remembering things.  To help with that, he will call his daughter or other family member multiple times a day just to tell him things or else he will forget.  Patient reported that he used to be a heavy drinker but due to his recent admission to the hospital he has stopped drinking. On September 21 he presented to the ED after a fall and was noted to have a left parieto-occipital region bleed.  He was admitted for further work-up.  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  Patient did have a  intracranial angiography that showed a prominent dilated early draining vein in the left occipital region, draining into the internal cerebral vein, consistent with shunting.  He is pending neurosurgery appointment in the month of November.  Daughter reported since being discharged from the hospital he has quit drinking, he was also put on thiamine and folate with multivitamin and since then he seems his memory has markedly improved.  His energy also is improved and his sleep is better, this week, he has been getting 7hrs per night.  He lives alone, daughter helps him with his breakfast and dinner, she also help with his medication.  On occasion he does have Meals on Wheels.  He is actively getting physical therapy     OTHER MEDICAL CONDITIONS: Hypertension, hyperlipidemia, Afib, depression, alcohol abuse in remission, coronary artery disease and BPH.   REVIEW OF SYSTEMS: Full 14 system review of systems performed and negative with exception of: As noted in the HPI  ALLERGIES: Allergies  Allergen Reactions   Ciprofloxacin Shortness Of Breath and Other (See Comments)    Numbness in extremities    Gabapentin Other (See Comments)    Mental problems, depression, anger    HOME MEDICATIONS: Outpatient Medications Prior to Visit  Medication Sig Dispense Refill   acetaminophen (TYLENOL) 500 MG tablet Take 1,000 mg by mouth every 6 (six) hours as needed (pain.).     albuterol (VENTOLIN HFA) 108 (90 Base) MCG/ACT inhaler Inhale 2 puffs into the lungs every 4 (four) hours  as needed for wheezing or shortness of breath. 8.5 g 0   amiodarone (PACERONE) 200 MG tablet Take 1 tablet (200 mg) by mouth twice daily till 08/17/2020 and then take 1 tablet (200 mg) once daily from 08/18/2020 onwards as per cardiology (Patient taking differently: Take 200 mg by mouth in the morning. Take 1 tablet (200 mg) by mouth twice daily till 08/17/2020 and then take 1 tablet (200 mg) once daily from 08/18/2020 onwards as per  cardiology) 30 tablet 0   amLODipine (NORVASC) 5 MG tablet Take 1 tablet (5 mg total) by mouth daily. 90 tablet 1   apixaban (ELIQUIS) 5 MG TABS tablet Take 1 tablet (5 mg total) by mouth 2 (two) times daily. 60 tablet 0   clotrimazole (LOTRIMIN) 1 % cream Apply 1 application topically 2 (two) times daily as needed (jock itch/groin discomfort).     FLUoxetine (PROZAC) 20 MG capsule Take 20 mg by mouth at bedtime.     folic acid (FOLVITE) 1 MG tablet Take 1 tablet (1 mg total) by mouth daily. 30 tablet 0   ketoconazole (NIZORAL) 2 % cream Apply topically 2 (two) times daily.     lovastatin (MEVACOR) 20 MG tablet Take 20 mg by mouth daily.     metoprolol succinate (TOPROL XL) 25 MG 24 hr tablet Take 1 tablet (25 mg total) by mouth daily. 90 tablet 0   montelukast (SINGULAIR) 10 MG tablet Take 10 mg by mouth daily.     Multiple Vitamin (MULTIVITAMIN WITH MINERALS) TABS tablet Take 1 tablet by mouth daily.     polyethylene glycol (MIRALAX / GLYCOLAX) 17 g packet Take 17 g by mouth daily as needed for mild constipation. (Patient taking differently: Take 17 g by mouth daily.) 14 each 0   terazosin (HYTRIN) 5 MG capsule Take 5 mg by mouth daily.     thiamine 100 MG tablet Take 1 tablet (100 mg total) by mouth daily. 120 tablet 0   tiotropium (SPIRIVA) 18 MCG inhalation capsule Place 1 capsule (18 mcg total) into inhaler and inhale daily. 30 capsule 11   furosemide (LASIX) 20 MG tablet Take one tablet daily for 3 days. 3 tablet 0   No facility-administered medications prior to visit.    PAST MEDICAL HISTORY: Past Medical History:  Diagnosis Date   Aortic atherosclerosis (HCC) 07/28/2020   BPH (benign prostatic hyperplasia)    Class 2 obesity 07/28/2020   Coronary artery calcification 07/28/2020   Depression    Family history of adverse reaction to anesthesia    Hyperlipidemia 07/28/2020   Hypertension    Tobacco use 07/28/2020    PAST SURGICAL HISTORY: Past Surgical History:  Procedure  Laterality Date   CARDIOVERSION N/A 08/02/2020   Procedure: CARDIOVERSION;  Surgeon: Meriam Sprague, MD;  Location: [facility] ENDOSCOPY;  Service: Cardiovascular;  Laterality: N/A;   IR ANGIO INTRA EXTRACRAN SEL COM CAROTID INNOMINATE BILAT MOD SED  10/04/2020   IR ANGIO VERTEBRAL SEL SUBCLAVIAN INNOMINATE BILAT MOD SED  10/03/2020   IR THORACENTESIS ASP PLEURAL SPACE W/IMG GUIDE  07/31/2020   IR US GUIDE VASC ACCESS RIGHT  10/04/2020   TEE WITHOUT CARDIOVERSION N/A 08/02/2020   Procedure: TRANSESOPHAGEAL ECHOCARDIOGRAM (TEE);  Surgeon: Meriam Sprague, MD;  Location: [facility] ENDOSCOPY;  Service: Cardiovascular;  Laterality: N/A;   TRANSURETHRAL RESECTION OF PROSTATE      FAMILY HISTORY: Family History  Problem Relation Age of Onset   Coronary artery disease Mother    Hypertension Mother    Hyperlipidemia  Mother    Ovarian cancer Mother    Ovarian cancer Sister    Liver cancer Sister    Liver cancer Brother     SOCIAL HISTORY: Social History   Socioeconomic History   Marital status: Divorced    Spouse name: Not on file   Number of children: Not on file   Years of education: Not on file   Highest education level: Not on file  Occupational History   Not on file  Tobacco Use   Smoking status: Former    Types: Cigarettes    Start date: 67    Quit date: 08/14/2020    Years since quitting: 0.1   Smokeless tobacco: Never  Vaping Use   Vaping Use: Never used  Substance and Sexual Activity   Alcohol use: Not Currently    Alcohol/week: 1.0 standard drink    Types: 1 Shots of liquor per week   Drug use: Never   Sexual activity: Not on file  Other Topics Concern   Not on file  Social History Narrative   Not on file   Social Determinants of Health   Financial Resource Strain: Not on file  Food Insecurity: Not on file  Transportation Needs: Not on file  Physical Activity: Not on file  Stress: Not on file  Social Connections: Not on file  Intimate Partner Violence: Not on  file     PHYSICAL EXAM  GENERAL EXAM/CONSTITUTIONAL: Vitals:  Vitals:   10/25/20 1147  BP: 123/72  Pulse: 73  Weight: 199 lb (90.3 kg)  Height: 5\' 9"  (1.753 m)   Body mass index is 29.39 kg/m. Wt Readings from Last 3 Encounters:  10/25/20 199 lb (90.3 kg)  10/18/20 199 lb (90.3 kg)  09/27/20 191 lb (86.6 kg)   Patient is in no distress; well developed, nourished and groomed; neck is supple  EYES: Pupils round and reactive to light, Visual fields full to confrontation, Extraocular movements intacts,   MUSCULOSKELETAL: Gait, strength, tone, movements noted in Neurologic exam below  NEUROLOGIC: MENTAL STATUS:  MMSE - Mini Mental State Exam 10/25/2020  Orientation to time 5  Orientation to Place 5  Registration 3  Attention/ Calculation 4  Recall 2  Language- name 2 objects 2  Language- repeat 1  Language- follow 3 step command 3  Language- read & follow direction 1  Write a sentence 1  Copy design 1  Total score 28    CRANIAL NERVE:  2nd, 3rd, 4th, 6th - pupils equal and reactive to light, visual fields full to confrontation, extraocular muscles intact, no nystagmus 5th - facial sensation symmetric 7th - facial strength symmetric 8th - hearing intact 9th - palate elevates symmetrically, uvula midline 11th - shoulder shrug symmetric 12th - tongue protrusion midline  MOTOR:  normal bulk and tone, full strength in the BUE, BLE  SENSORY:  normal and symmetric to light touch, pinprick, temperature, vibration  COORDINATION:  finger-nose-finger, fine finger movements normal  REFLEXES:  deep tendon reflexes present and symmetric  GAIT/STATION:  Walks with a walker    DIAGNOSTIC DATA (LABS, IMAGING, TESTING) - I reviewed patient records, labs, notes, testing and imaging myself where available.  Lab Results  Component Value Date   WBC 5.8 10/04/2020   HGB 13.8 10/04/2020   HCT 42.0 10/04/2020   MCV 95.2 10/04/2020   PLT 214 10/04/2020       Component Value Date/Time   NA 133 (L) 10/04/2020 0329   K 4.1 10/04/2020 0329   CL  100 10/04/2020 0329   CO2 27 10/04/2020 0329   GLUCOSE 122 (H) 10/04/2020 0329   BUN 11 10/04/2020 0329   CREATININE 0.65 10/04/2020 0329   CALCIUM 8.6 (L) 10/04/2020 0329   PROT 6.8 09/27/2020 1813   ALBUMIN 3.1 (L) 10/04/2020 0329   AST 27 09/27/2020 1813   ALT 31 09/27/2020 1813   ALKPHOS 107 09/27/2020 1813   BILITOT 0.4 09/27/2020 1813   GFRNONAA >60 10/04/2020 0329   Lab Results  Component Value Date   CHOL 73 07/30/2020   HDL 34 (L) 07/30/2020   LDLCALC 33 07/30/2020   TRIG 29 07/30/2020   CHOLHDL 2.1 07/30/2020   Lab Results  Component Value Date   HGBA1C 5.7 (H) 07/30/2020   Lab Results  Component Value Date   VITAMINB12 317 08/15/2020   Lab Results  Component Value Date   TSH 1.920 07/28/2020     CT head 09/27/2020 1. 7 mm acute parenchymal bleed and surrounding edema within the left occipital lobe. MRI correlation is recommended. 2. Generalized cerebral atrophy.    MRI Brain 09/28/2020 1. Acute left occipital intraparenchymal hemorrhage with surrounding edema. There is a prominent vessel in this region, which courses towards the left lateral ventricle. Recommend postcontrast MRI and CTA to further evaluate and to exclude a vascular malformation as a cause of the hemorrhage. 2. 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 The dilated vein corresponds to the vessel described on prior CT angiogram and MRI of the brain.  ASSESSMENT AND PLAN  70 y.o. year old male with past medical history of hypertension, A. fib, depression who is presenting for memory problem.  Prior to his recent admission daughter reported patient was drinking  heavily at that time he has difficulty remembering, completing task, having difficulty sleeping and his memory was poor.  But since being discharged from the hospital in September after being admitted for fall and found to have occipital bleed he has quit drinking.  He was discharged on thiamine and folate with multivitamin and since then his memory seems to improve.  His energy also is improving.  He is currently getting physical therapy.  He scored 28 out of 30 on a Mini-Mental status exam today but based on history and per patient there is some mild cognitive impairment.  I will start the patient on rivastigmine 1.5 mg twice daily for 2 weeks then increase to 6 mg twice daily for another month before increasing the dose.  He has a history of A. fib and coronary artery disease therefore I did not start him on Aricept.  I will follow-up in 77-month.   1. Mild cognitive impairment      PLAN: Start Rivastigmine as scheduled   1.5 mg twice a day for 2 weeks   Then increase 3 mg thereafter   1000 mcg B12 supplement Return in 6 months   Orders Placed This Encounter  Procedures   Ambulatory referral to Neuropsychology     Meds ordered this encounter  Medications   rivastigmine (EXELON) 1.5 MG capsule    Sig: Take 1 capsule (1.5 mg total) by mouth 2 (two) times daily for 14 days, THEN 2 capsules (3 mg total) 2 (two) times daily.    Dispense:  148 capsule    Refill:  0  Cyanocobalamin (B-12) 1000 MCG TABS    Sig: Take 1 tablet (1,000 mcg total) by mouth daily.    Dispense:  90 tablet    Refill:  0     Return in about 6 months (around 04/25/2021).    Windell Norfolk, MD 10/25/2020, 1:42 PM  [facility] [address] [place], [place] [address] [phone]

## 2020-10-25 NOTE — Patient Instructions (Signed)
Start Rivastigmine as scheduled   1.5 mg twice a day for 2 weeks   Then increase 3 mg thereafter   Return in 6 months     There are well-accepted and sensible ways to reduce risk for Alzheimers disease and other degenerative brain disorders .  Exercise Daily Walk A daily 20 minute walk should be part of your routine. Disease related apathy can be a significant roadblock to exercise and the only way to overcome this is to make it a daily routine and perhaps have a reward at the end (something your loved one loves to eat or drink perhaps) or a personal trainer coming to the home can also be very useful. Most importantly, the patient is much more likely to exercise if the caregiver / spouse does it with him/her. In general a structured, repetitive schedule is best.  General Health: Any diseases which effect your body will effect your brain such as a pneumonia, urinary infection, blood clot, heart attack or stroke. Keep contact with your primary care doctor for regular follow ups.  Sleep. A good nights sleep is healthy for the brain. Seven hours is recommended. If you have insomnia or poor sleep habits we can give you some instructions. If you have sleep apnea wear your mask.  Diet: Eating a heart healthy diet is also a good idea; fish and poultry instead of red meat, nuts (mostly non-peanuts), vegetables, fruits, olive oil or canola oil (instead of butter), minimal salt (use other spices to flavor foods), whole grain rice, bread, cereal and pasta and wine in moderation.Research is now showing that the MIND diet, which is a combination of The Mediterranean diet and the DASH diet, is beneficial for cognitive processing and longevity. Information about this diet can be found in The MIND Diet, a book by Alonna Minium, MS, RDN, and online at [employer]  Finances, Power of [address] and Advance Directives: You should consider putting legal safeguards in place with regard to  financial and medical decision making. While the spouse always has power of attorney for medical and financial issues in the absence of any form, you should consider what you want in case the spouse / caregiver is no longer around or capable of making decisions.      Heart-head connection  New research shows there are things we can do to reduce the risk of mild cognitive impairment and dementia.  Several conditions known to increase the risk of cardiovascular disease -- such as high blood pressure, diabetes and high cholesterol -- also increase the risk of developing Alzheimer's. Some autopsy studies show that as many as 80 percent of individuals with Alzheimer's disease also have cardiovascular disease.  A longstanding question is why some people develop hallmark Alzheimer's plaques and tangles but do not develop the symptoms of Alzheimer's. Vascular disease may help researchers eventually find an answer. Some autopsy studies suggest that plaques and tangles may be present in the brain without causing symptoms of cognitive decline unless the brain also shows evidence of vascular disease. More research is needed to better understand the link between vascular health and Alzheimer's.  Physical exercise and diet Regular physical exercise may be a beneficial strategy to lower the risk of Alzheimer's and vascular dementia. Exercise may directly benefit brain cells by increasing blood and oxygen flow in the brain. Because of its known cardiovascular benefits, a medically approved exercise program is a valuable part of any overall wellness plan.  Current evidence suggests that heart-healthy eating may also help protect  the brain. Heart-healthy eating includes limiting the intake of sugar and saturated fats and making sure to eat plenty of fruits, vegetables, and whole grains. No one diet is best. Two diets that have been studied and may be beneficial are the DASH (Dietary Approaches to Stop Hypertension)  diet and the Mediterranean diet. The DASH diet emphasizes vegetables, fruits and fat-free or low-fat dairy products; includes whole grains, fish, poultry, beans, seeds, nuts and vegetable oils; and limits sodium, sweets, sugary beverages and red meats. A Mediterranean diet includes relatively little red meat and emphasizes whole grains, fruits and vegetables, fish and shellfish, and nuts, olive oil and other healthy fats.  Social connections and intellectual activity A number of studies indicate that maintaining strong social connections and keeping mentally active as we age might lower the risk of cognitive decline and Alzheimer's. Experts are not certain about the reason for this association. It may be due to direct mechanisms through which social and mental stimulation strengthen connections between nerve cells in the brain.  Head trauma There appears to be a strong link between future risk of Alzheimer's and serious head trauma, especially when injury involves loss of consciousness. You can help reduce your risk of Alzheimer's by protecting your head.  Wear a seat belt  Use a helmet when participating in sports  "Fall-proof" your home   What you can do now While research is not yet conclusive, certain lifestyle choices, such as physical activity and diet, may help support brain health and prevent Alzheimer's. Many of these lifestyle changes have been shown to lower the risk of other diseases, like heart disease and diabetes, which have been linked to Alzheimer's. With few drawbacks and plenty of known benefits, healthy lifestyle choices can improve your health and possibly protect your brain.  Learn more about brain health. You can help increase our knowledge by considering participation in a clinical study. Our free clinical trial matching services, TrialMatch, can help you find clinical trials in your area that are seeking volunteers.

## 2020-10-27 LAB — BASIC METABOLIC PANEL
BUN/Creatinine Ratio: 23 (ref 10–24)
BUN: 15 mg/dL (ref 8–27)
CO2: 25 mmol/L (ref 20–29)
Calcium: 9.2 mg/dL (ref 8.6–10.2)
Chloride: 98 mmol/L (ref 96–106)
Creatinine, Ser: 0.65 mg/dL — ABNORMAL LOW (ref 0.76–1.27)
Glucose: 91 mg/dL (ref 70–99)
Potassium: 4.8 mmol/L (ref 3.5–5.2)
Sodium: 139 mmol/L (ref 134–144)
eGFR: 101 mL/min/{1.73_m2} (ref >59)

## 2020-10-27 LAB — CBC
Hematocrit: 39.9 % (ref 37.5–51.0)
Hemoglobin: 13.3 g/dL (ref 13.0–17.7)
MCH: 30.9 pg (ref 26.6–33.0)
MCHC: 33.3 g/dL (ref 31.5–35.7)
MCV: 93 fL (ref 79–97)
Platelets: 218 10*3/uL (ref 150–450)
RBC: 4.3 x10E6/uL (ref 4.14–5.80)
RDW: 12.7 % (ref 11.6–15.4)
WBC: 5.6 10*3/uL (ref 3.4–10.8)

## 2020-10-30 ENCOUNTER — Telehealth: Payer: Self-pay | Admitting: Neurology

## 2020-10-30 NOTE — Anesthesia Preprocedure Evaluation (Addendum)
Anesthesia Evaluation  Patient identified by MRN, date of birth, ID band Patient awake    Reviewed: Allergy & Precautions, NPO status , Patient's Chart, lab work & pertinent test results  Airway Mallampati: II  TM Distance: >3 FB Neck ROM: Full    Dental  (+) Upper Dentures, Lower Dentures, Dental Advisory Given   Pulmonary neg pulmonary ROS, Current Smoker and Patient abstained from smoking., former smoker,    Pulmonary exam normal        Cardiovascular hypertension, Pt. on medications and Pt. on home beta blockers + CAD   Rhythm:Irregular Rate:Normal  IMPRESSIONS    1. Left ventricular ejection fraction, by estimation, is 55 to 60%. The  left ventricle has normal function.  2. Right ventricular systolic function is normal. The right ventricular  size is normal.  3. Left atrial size was mild to moderately dilated. No left atrial/left  atrial appendage thrombus was detected.  4. Right atrial size was moderately dilated.  5. The mitral valve is normal in structure. Mild mitral valve  regurgitation. No evidence of mitral stenosis.  6. The aortic valve is tricuspid. There is mild calcification of the  aortic valve. There is mild thickening of the aortic valve. Aortic valve  regurgitation is not visualized. Mild aortic valve sclerosis is present,  with no evidence of aortic valve  stenosis.  7. There is Moderate (Grade III) plaque.  8. Successful DCCV performed (200J x1) after TEE with return to NSR.  Please see separate note.    Neuro/Psych PSYCHIATRIC DISORDERS Depression negative neurological ROS     GI/Hepatic negative GI ROS, Neg liver ROS,   Endo/Other  negative endocrine ROS  Renal/GU negative Renal ROS  negative genitourinary   Musculoskeletal negative musculoskeletal ROS (+)   Abdominal   Peds negative pediatric ROS (+)  Hematology negative hematology ROS (+)   Anesthesia Other Findings    Reproductive/Obstetrics negative OB ROS                            Anesthesia Physical  Anesthesia Plan  ASA: 3  Anesthesia Plan: General   Post-op Pain Management:    Induction: Intravenous  PONV Risk Score and Plan: 2 and Treatment may vary due to age or medical condition and TIVA  Airway Management Planned: Mask  Additional Equipment: None  Intra-op Plan:   Post-operative Plan:   Informed Consent: I have reviewed the patients History and Physical, chart, labs and discussed the procedure including the risks, benefits and alternatives for the proposed anesthesia with the patient or authorized representative who has indicated his/her understanding and acceptance.       Plan Discussed with: Anesthesiologist and CRNA  Anesthesia Plan Comments:        Anesthesia Quick Evaluation

## 2020-10-30 NOTE — Telephone Encounter (Signed)
Neuropsych referral sent to [facility] via [facility] Link d/t long wait times with local practices. Phone: [phone].

## 2020-10-31 ENCOUNTER — Other Ambulatory Visit: Payer: Self-pay

## 2020-10-31 ENCOUNTER — Ambulatory Visit (HOSPITAL_COMMUNITY): Payer: Medicare Other | Admitting: Certified Registered"

## 2020-10-31 ENCOUNTER — Ambulatory Visit (HOSPITAL_COMMUNITY)
Admission: RE | Admit: 2020-10-31 | Discharge: 2020-10-31 | Disposition: A | Discharge status: Home / Self Care | Payer: Medicare Other | Source: Ambulatory Visit | Attending: Cardiology | Admitting: Cardiology

## 2020-10-31 ENCOUNTER — Encounter (HOSPITAL_COMMUNITY): Payer: Self-pay | Admitting: Cardiology

## 2020-10-31 ENCOUNTER — Encounter (HOSPITAL_COMMUNITY)
Admission: RE | Disposition: A | Discharge status: Home / Self Care | Payer: Self-pay | Source: Ambulatory Visit | Attending: Cardiology

## 2020-10-31 DIAGNOSIS — I110 Hypertensive heart disease with heart failure: Secondary | ICD-10-CM | POA: Insufficient documentation

## 2020-10-31 DIAGNOSIS — F32A Depression, unspecified: Secondary | ICD-10-CM | POA: Insufficient documentation

## 2020-10-31 DIAGNOSIS — E785 Hyperlipidemia, unspecified: Secondary | ICD-10-CM | POA: Insufficient documentation

## 2020-10-31 DIAGNOSIS — Z888 Allergy status to other drugs, medicaments and biological substances status: Secondary | ICD-10-CM | POA: Insufficient documentation

## 2020-10-31 DIAGNOSIS — Z9981 Dependence on supplemental oxygen: Secondary | ICD-10-CM | POA: Diagnosis not present

## 2020-10-31 DIAGNOSIS — E669 Obesity, unspecified: Secondary | ICD-10-CM | POA: Diagnosis not present

## 2020-10-31 DIAGNOSIS — Z87891 Personal history of nicotine dependence: Secondary | ICD-10-CM | POA: Insufficient documentation

## 2020-10-31 DIAGNOSIS — N400 Enlarged prostate without lower urinary tract symptoms: Secondary | ICD-10-CM | POA: Insufficient documentation

## 2020-10-31 DIAGNOSIS — I5032 Chronic diastolic (congestive) heart failure: Secondary | ICD-10-CM | POA: Diagnosis not present

## 2020-10-31 DIAGNOSIS — E512 Wernicke's encephalopathy: Secondary | ICD-10-CM | POA: Insufficient documentation

## 2020-10-31 DIAGNOSIS — I2510 Atherosclerotic heart disease of native coronary artery without angina pectoris: Secondary | ICD-10-CM | POA: Insufficient documentation

## 2020-10-31 DIAGNOSIS — Z79899 Other long term (current) drug therapy: Secondary | ICD-10-CM | POA: Insufficient documentation

## 2020-10-31 DIAGNOSIS — Z7901 Long term (current) use of anticoagulants: Secondary | ICD-10-CM | POA: Diagnosis not present

## 2020-10-31 DIAGNOSIS — J9611 Chronic respiratory failure with hypoxia: Secondary | ICD-10-CM | POA: Diagnosis not present

## 2020-10-31 DIAGNOSIS — Z881 Allergy status to other antibiotic agents status: Secondary | ICD-10-CM | POA: Diagnosis not present

## 2020-10-31 DIAGNOSIS — I4891 Unspecified atrial fibrillation: Secondary | ICD-10-CM | POA: Diagnosis not present

## 2020-10-31 DIAGNOSIS — Z6829 Body mass index (BMI) 29.0-29.9, adult: Secondary | ICD-10-CM | POA: Diagnosis not present

## 2020-10-31 HISTORY — PX: CARDIOVERSION: SHX1299

## 2020-10-31 SURGERY — CARDIOVERSION
Anesthesia: General

## 2020-10-31 MED ORDER — SODIUM CHLORIDE 0.9 % IV SOLN: INTRAVENOUS | Status: DC | PRN

## 2020-10-31 MED ORDER — LIDOCAINE 2% (20 MG/ML) 5 ML SYRINGE
INTRAMUSCULAR | Status: DC | PRN
  Administered 2020-10-31: 100 mg via INTRAVENOUS

## 2020-10-31 MED ORDER — PROPOFOL 10 MG/ML IV BOLUS
INTRAVENOUS | Status: DC | PRN
  Administered 2020-10-31: 20 mg via INTRAVENOUS
  Administered 2020-10-31: 80 mg via INTRAVENOUS

## 2020-10-31 NOTE — Transfer of Care (Signed)
Immediate Anesthesia Transfer of Care Note  Patient: Samuel Barnes  Procedure(s) Performed: CARDIOVERSION  Patient Location: Endoscopy Unit  Anesthesia Type:General  Level of Consciousness: drowsy  Airway & Oxygen Therapy: Patient Spontanous Breathing  Post-op Assessment: Report given to RN  Post vital signs: Reviewed and stable  Last Vitals:  Vitals Value Taken Time  BP 144/83   Temp    Pulse 73   Resp 15   SpO2 97     Last Pain:  Vitals:   10/31/20 0820  TempSrc: Temporal  PainSc: 0-No pain         Complications: No notable events documented.

## 2020-10-31 NOTE — CV Procedure (Signed)
   Electrical Cardioversion Procedure Note Samuel Barnes [id number] 15-Jan-1950  Procedure: Electrical Cardioversion Indications:  Atrial Fibrillation  Time Out: Verified patient identification, verified procedure,medications/allergies/relevent history reviewed, required imaging and test results available.  Performed  Procedure Details  The patient signed informed consent.   The patient was NPO past midnight. Has had therapeutic anticoagulation with Eliquis greater than 3 weeks. The patient denies any interruption of anticoagulation.  Anesthesia was administered by the Anesthesiology team.  Adequate airway was maintained throughout and vital followed per protocol.  He was cardioverted x 3 with 200J of biphasic synchronized energy.  He did not convert and remained in atrial fibrillation.  There were no apparent complications.  The patient tolerated the procedure well and had normal neuro status and respiratory status post procedure with vitals stable as recorded elsewhere.     IMPRESSION:  Unsuccessful cardioversion - patient remained in atrial fibrillation.   Follow up:  We will arrange follow up with primary cardiologist.  He will continue on current medical therapy.  The patient advised to continue anticoagulation.  Samuel Barnes 10/31/2020, 8:51 AM

## 2020-10-31 NOTE — Anesthesia Postprocedure Evaluation (Signed)
Anesthesia Post Note  Patient: Samuel Barnes  Procedure(s) Performed: CARDIOVERSION     Patient location during evaluation: PACU Anesthesia Type: General Level of consciousness: sedated Pain management: pain level controlled Vital Signs Assessment: post-procedure vital signs reviewed and stable Respiratory status: spontaneous breathing and respiratory function stable Cardiovascular status: stable Postop Assessment: no apparent nausea or vomiting Anesthetic complications: no   No notable events documented.  Last Vitals:  Vitals:   10/31/20 0903 10/31/20 0913  BP: 101/65 119/67  Pulse: 68 69  Resp: 12 16  Temp:    SpO2: 95% 96%    Last Pain:  Vitals:   10/31/20 0913  TempSrc:   PainSc: 0-No pain                 Cabrina Shiroma DANIEL

## 2020-10-31 NOTE — Interval H&P Note (Signed)
History and Physical Interval Note:  10/31/2020 8:12 AM  Samuel Barnes  has presented today for surgery, with the diagnosis of AFIB.  The various methods of treatment have been discussed with the patient and family. After consideration of risks, benefits and other options for treatment, the patient has consented to  Procedure(s): CARDIOVERSION (N/A) as a surgical intervention.  The patient's history has been reviewed, patient examined, no change in status, stable for surgery.  I have reviewed the patient's chart and labs.  Questions were answered to the patient's satisfaction.     Yavier Snider

## 2020-10-31 NOTE — Discharge Instructions (Signed)

## 2020-11-01 ENCOUNTER — Telehealth: Payer: Self-pay | Admitting: Cardiology

## 2020-11-01 MED ORDER — FUROSEMIDE 20 MG PO TABS: 20.0000 mg | ORAL_TABLET | Freq: Every day | ORAL | 0 refills | Status: DC

## 2020-11-01 NOTE — Telephone Encounter (Signed)
Recommend Lasix 20mg  daily x 3 days. Continue monitoring weight daily and report weight gain of 2 lbs overnight or 5 lbs in one week  , NP

## 2020-11-01 NOTE — Telephone Encounter (Signed)
hasnt been feeling well since cardioversion.. pt is complaining of sob whenever he moves around.. has gained four pounds overnight.. has a little bit of swelling in his ankles, oxygen level went down to 94 from 97, bp is normal, no chestpain., complaining of weakness

## 2020-11-01 NOTE — Telephone Encounter (Signed)
Spoke with the daughter and advised her on recommendations from Gillian Shields, NP. She verbalized understanding.

## 2020-11-01 NOTE — Telephone Encounter (Signed)
Spoke with the patient's daughter who states that the patient had a cardioversion yesterday. She states that today his weight is up to 196 from 192 yesterday. She reports that he has a little bit of swelling in his legs. He feels a bit weak and has some shortness of breath with exertion. She reports his BP has been good 122/73, 128/72. HR has been in the 60s. Advised the daughter to make sure the patient is moving around and not laying down flat. If he is resting make sure he is sitting up and elevating his feet. Advised to continue monitoring O2 and let us know if it drops further. Patient has post-cardioversion FU scheduled 11/8 with Gillian Shields, NP.

## 2020-11-07 HISTORY — PX: CATARACT EXTRACTION: SUR2

## 2020-11-14 ENCOUNTER — Encounter (HOSPITAL_BASED_OUTPATIENT_CLINIC_OR_DEPARTMENT_OTHER): Payer: Self-pay | Admitting: Family

## 2020-11-14 ENCOUNTER — Ambulatory Visit (INDEPENDENT_AMBULATORY_CARE_PROVIDER_SITE_OTHER): Payer: Medicare Other | Admitting: Family

## 2020-11-14 ENCOUNTER — Other Ambulatory Visit: Payer: Self-pay

## 2020-11-14 VITALS — BP 120/62 | HR 59 | Ht 69.0 in | Wt 201.0 lb

## 2020-11-14 DIAGNOSIS — I2510 Atherosclerotic heart disease of native coronary artery without angina pectoris: Secondary | ICD-10-CM

## 2020-11-14 DIAGNOSIS — I480 Paroxysmal atrial fibrillation: Secondary | ICD-10-CM

## 2020-11-14 DIAGNOSIS — I10 Essential (primary) hypertension: Secondary | ICD-10-CM

## 2020-11-14 DIAGNOSIS — I5032 Chronic diastolic (congestive) heart failure: Secondary | ICD-10-CM

## 2020-11-14 DIAGNOSIS — E512 Wernicke's encephalopathy: Secondary | ICD-10-CM

## 2020-11-14 DIAGNOSIS — E785 Hyperlipidemia, unspecified: Secondary | ICD-10-CM | POA: Diagnosis not present

## 2020-11-14 DIAGNOSIS — D6859 Other primary thrombophilia: Secondary | ICD-10-CM

## 2020-11-14 NOTE — Progress Notes (Signed)
Office Visit    Patient Name: Samuel Barnes Date of Encounter: 11/14/2020  PCP:  Manfred Shirts, [place]  Cardiologist:  Fransico Him, MD  Advanced Practice Provider:  No care team member to display Electrophysiologist:  None    Chief Complaint    Samuel Barnes is a 70 y.o. male with a hx of CHF, chronic hypoxic respiratory failure on 3L O2, atrial fibrillation on Eliquis, BPH, CAD, HLD, tobacco use, obesity, depression, alcohol abuse presents today for follow up after cardioversion.   Past Medical History    Past Medical History:  Diagnosis Date   Aortic atherosclerosis ([place]) 07/28/2020   BPH (benign prostatic hyperplasia)    Class 2 obesity 07/28/2020   Coronary artery calcification 07/28/2020   Depression    Family history of adverse reaction to anesthesia    Hyperlipidemia 07/28/2020   Hypertension    Tobacco use 07/28/2020   Past Surgical History:  Procedure Laterality Date   CARDIOVERSION N/A 08/02/2020   Procedure: CARDIOVERSION;  Surgeon: Freada Bergeron, MD;  Location: [facility];  Service: Cardiovascular;  Laterality: N/A;   CARDIOVERSION N/A 10/31/2020   Procedure: CARDIOVERSION;  Surgeon: Berniece Salines, DO;  Location: [facility];  Service: Cardiovascular;  Laterality: N/A;   IR ANGIO INTRA EXTRACRAN SEL COM CAROTID INNOMINATE BILAT MOD SED  10/04/2020   IR ANGIO VERTEBRAL SEL SUBCLAVIAN INNOMINATE BILAT MOD SED  10/03/2020   IR THORACENTESIS ASP PLEURAL SPACE W/IMG GUIDE  07/31/2020   IR US GUIDE VASC ACCESS RIGHT  10/04/2020   TEE WITHOUT CARDIOVERSION N/A 08/02/2020   Procedure: TRANSESOPHAGEAL ECHOCARDIOGRAM (TEE);  Surgeon: Freada Bergeron, MD;  Location: [facility] ENDOSCOPY;  Service: Cardiovascular;  Laterality: N/A;   TRANSURETHRAL RESECTION OF PROSTATE      Allergies  Allergies  Allergen Reactions   Ciprofloxacin Shortness Of Breath and Other (See Comments)    Numbness in extremities     Gabapentin Other (See Comments)    Mental problems, depression, anger    History of Present Illness    Samuel Barnes is a 70 y.o. male with a hx of CHF, chronic hypoxic respiratory failure on 3L O2, atrial fibrillation on Eliquis, BPH, CAD, HLD, tobacco use, obesity, depression, alcohol abuse last seen 10/18/20  Admitted 07/28/2020 - 08/08/2020 with acute on chronic diastolic heart failure, paroxysmal new onset atrial fibrillation, and pleural effusion.  No prior cardiac imaging or ischemic testing coronary artery calcifications on previous CT scan from 2019.  Underwent right thoracentesis centesis.TEE 08/02/20 LVEF 55-60%, no left atrial appendage thrombus. DCCV subsequently performed. Only brief restoration of NSR. He was diuresed with IV Lasix 9 litersand discharged on p.o. Lasix.  He was admitted 08/14/20-08/21/20 with Wernicke's encephalopathy, syncope, metabolic encephalopathy. CT/MRI brain unremarkable. Family noted cognitive decline for past few weeks.  08/30/20 labs via Care Everywhere: NA 133, AST 38, ALT 78, alkaline phosphatase 158, creatinine 0.69, GFR greater than 90 Hemoglobin 15.4, hematocrit 46.7, platelets 314 B12 486, PSA 2.3, Hemoglobin A1c 5.9  Admitted 09/27/20-10/04/20 after near syncope and fall. He was diagnosed with ICH. CT head with 32mm acute parenchymal bleed with surrounding edema within left ocipital lobe. 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  Underwent angiography showing "a prominently dilated, early draining vein in the left occipital region, draining into the internal cerebral vein, consistent with shunting.  No clear liters seen.  The pain is flow is anterograde."  He was cleared to resume Eliquis on discharge. Given bradycardia, Toprol further reduced to 25mg  QD. Low dose Amlodipine was added for blood pressure control.   Seen in follow-up 10/18/2020.  He was still in atrial fibrillation and  scheduled for cardioversion which was performed 10/31/2020 but was unsuccessful.   He presents today for follow up with his daughter. His EKG today shows sinus bradycardia and he is pleased to have self-converted. Notes he has been sleeping better over the last week or so. Over the last 4 days his heart rate has decreased from 70s to 58-62bpm. Likely when he self converted. Notes LE edema improved after his 3 days of Lasix, wonders whether he can have a PRN Rx. No chest pain, palpitations, dyspnea, orthopnea, PND.   EKGs/Labs/Other Studies Reviewed:   The following studies were reviewed today:   ECHO:  TEE 08/02/20   1. Left ventricular ejection fraction, by estimation, is 55 to 60%. The  left ventricle has normal function.   2. Right ventricular systolic function is normal. The right ventricular  size is normal.   3. Left atrial size was mild to moderately dilated. No left atrial/left  atrial appendage thrombus was detected.   4. Right atrial size was moderately dilated.   5. The mitral valve is normal in structure. Mild mitral valve  regurgitation. No evidence of mitral stenosis.   6. The aortic valve is tricuspid. There is mild calcification of the  aortic valve. There is mild thickening of the aortic valve. Aortic valve  regurgitation is not visualized. Mild aortic valve sclerosis is present,  with no evidence of aortic valve  stenosis.   7. There is Moderate (Grade III) plaque.   8. Successful DCCV performed (200J x1) after TEE with return to NSR.  Please see separate note.   EKG: EKG performed today demonstrates SB 59 bpm with no acute St/T wave changes.   Recent Labs: 07/28/2020: TSH 1.920 08/14/2020: B Natriuretic Peptide 135.2 09/27/2020: ALT 31 10/04/2020: Magnesium 2.0 10/26/2020: BUN 15; Creatinine, Ser 0.65; Hemoglobin 13.3; Platelets 218; Potassium 4.8; Sodium 139  Recent Lipid Panel    Component Value Date/Time   CHOL 73 07/30/2020 0555   TRIG 29 07/30/2020 0555    HDL 34 (L) 07/30/2020 0555   CHOLHDL 2.1 07/30/2020 0555   VLDL 6 07/30/2020 0555   LDLCALC 33 07/30/2020 0555    Home Medications   Current Meds  Medication Sig   acetaminophen (TYLENOL) 500 MG tablet Take 1,000 mg by mouth every 6 (six) hours as needed (pain.).   albuterol (VENTOLIN HFA) 108 (90 Base) MCG/ACT inhaler Inhale 2 puffs into the lungs every 4 (four) hours as needed for wheezing or shortness of breath.   amiodarone (PACERONE) 200 MG tablet Take 1 tablet (200 mg) by mouth twice daily till 08/17/2020 and then take 1 tablet (200 mg) once daily from 08/18/2020 onwards as per cardiology (Patient taking differently: Take 200 mg by mouth in the morning. Take 1 tablet (200 mg) by mouth twice daily till 08/17/2020 and then take 1 tablet (200 mg) once daily from 08/18/2020 onwards as per cardiology)   amLODipine (NORVASC) 5 MG tablet Take 1 tablet (5 mg total) by mouth daily.   apixaban (ELIQUIS) 5 MG TABS tablet Take 1 tablet (5 mg total) by mouth 2 (two) times daily.   Cyanocobalamin (B-12) 1000 MCG TABS Take 1 tablet (1,000 mcg total) by mouth daily.   FLUoxetine (PROZAC) 20 MG capsule Take 20 mg by mouth at bedtime.  folic acid (FOLVITE) 1 MG tablet Take 1 tablet (1 mg total) by mouth daily.   ketoconazole (NIZORAL) 2 % cream Apply 1 application topically 2 (two) times daily. GROIN AREA   ketorolac (ACULAR) 0.5 % ophthalmic solution SMARTSIG:In Eye(s)   lovastatin (MEVACOR) 20 MG tablet Take 20 mg by mouth daily.   metoprolol succinate (TOPROL XL) 25 MG 24 hr tablet Take 1 tablet (25 mg total) by mouth daily.   montelukast (SINGULAIR) 10 MG tablet Take 10 mg by mouth daily.   Multiple Vitamin (MULTIVITAMIN WITH MINERALS) TABS tablet Take 1 tablet by mouth daily.   ofloxacin (OCUFLOX) 0.3 % ophthalmic solution    polyethylene glycol (MIRALAX / GLYCOLAX) 17 g packet Take 17 g by mouth daily as needed for mild constipation.   prednisoLONE acetate (PRED FORTE) 1 % ophthalmic suspension  SMARTSIG:In Eye(s)   rivastigmine (EXELON) 1.5 MG capsule Take 1 capsule (1.5 mg total) by mouth 2 (two) times daily for 14 days, THEN 2 capsules (3 mg total) 2 (two) times daily.   terazosin (HYTRIN) 5 MG capsule Take 5 mg by mouth in the morning.   thiamine 100 MG tablet Take 1 tablet (100 mg total) by mouth daily.   tiotropium (SPIRIVA) 18 MCG inhalation capsule Place 1 capsule (18 mcg total) into inhaler and inhale daily.     Review of Systems      All other systems reviewed and are otherwise negative except as noted above.  Physical Exam    VS:  BP 120/62   Pulse (!) 59   Ht 5\' 9"  (1.753 m)   Wt 201 lb (91.2 kg)   BMI 29.68 kg/m  , BMI Body mass index is 29.68 kg/m.  Wt Readings from Last 3 Encounters:  11/14/20 201 lb (91.2 kg)  10/31/20 199 lb (90.3 kg)  10/25/20 199 lb (90.3 kg)    GEN: Well nourished, well developed, in no acute distress. HEENT: normal. Neck: Supple, no JVD, carotid bruits, or masses. Cardiac: bradycardic and regular, no murmurs, rubs, or gallops. No clubbing, cyanosis, edema.  Radials/PT 2+ and equal bilaterally.  Respiratory:  Respirations regular and unlabored, clear to auscultation bilaterally. GI: Soft, nontender, nondistended. MS: No deformity or atrophy. Skin: Warm and dry, no rash. Neuro:  Strength and sensation are intact. Psych: Normal affect.  Assessment & Plan    Wernicke's encephaolopathy - Recent admission. Mentation improving on folate and thiamine. Mentation much improved. He is following with neurology.   PAF / Chronic anticoagulation - Unsuccessful cardioversion 11/01/20. Has since self converted. LIkely converted 11/10/20 based on home HR readings. Encouraged to purchase Kardia for home use. CHA2DS2-VASc Score = 4 [CHF History: 1, HTN History: 1, Diabetes History: 0, Stroke History: 0, Vascular Disease History: 1, Age Score: 1, Gender Score: 0].  Therefore, the patient's annual risk of stroke is 4.8 %.    Continue Toprol 25 mg  daily.  Continue Amiodarone 200mg  QD. No evidence of toxicity. 08/2020 normal TSH, 10/2020 normal LFTs. Continue to monitor q6 mos.    HTN - BP well controlled in clinic today.  No recurrent orthostasis.  Continue amlodipine 5 mg daily, metoprolol 25 mg daily.Encouraged to bring home monitor to assess accuracy.  HLD, LDL goal <70 - 7/24 22 LDL 33. Continue Lovastatin 20mg  daily.   HFpEF - Echo 07/2020 normal LVEF, gr2DD.   Low salt diet and fluid <2L restriction encouraged. Will Rx Lasix 20mg  PRN for weight gain of 2 lbs overnight or 5 lbs in one week.  Low salt diet encouraged.   Coronary artery calcification on CT - Stable with no anginal symptoms. No indication for ischemic evaluation. GDMT includes metoprolol, lovastatin. No aspirin due to chronic anticoagulation. Heart healthy diet and regular cardiovascular exercise encouraged.   Tobacco use / ETOH use - Has completely abstained from etoh and tobacco since discharge and congratulated.  Mild Cognitive Impairment - Follows with neurology. He was started on Exelon by neurology.  Disposition: Follow up in 4 months with Dr. Radford Pax or APP.  Signed, Loel Dubonnet, NP 11/14/2020, 10:16 AM [place]

## 2020-11-14 NOTE — Patient Instructions (Signed)
Medication Instructions:  Start: Lasix 20 mg as needed- Please take this as needed if you gain 2 lbs overnight or 5 lbs in a week. If you are taking this more than 2 times per week please give [place] a all  *If you need a refill on your cardiac medications before your next appointment, please call your pharmacy*   Lab Work: None today    Testing/Procedures: None today   Follow-Up: At [facility], you and your health needs are our priority.  As part of our continuing mission to provide you with exceptional heart care, we have created designated Provider Care Teams.  These Care Teams include your primary Cardiologist (physician) and Advanced Practice Providers (APPs -  Physician Assistants and Nurse Practitioners) who all work together to provide you with the care you need, when you need it.  We recommend signing up for the patient portal called "MyChart".  Sign up information is provided on this After Visit Summary.  MyChart is used to connect with patients for Virtual Visits (Telemedicine).  Patients are able to view lab/test results, encounter notes, upcoming appointments, etc.  Non-urgent messages can be sent to your provider as well.   To learn more about what you can do with MyChart, go to [email or web address].    Your next appointment:   3 month(s)  The format for your next appointment:   In Person  Provider:  Dr. Mayford Knife or APPs  Other Instructions  Kardia for monitoring of EKGs at home: You can look into the [facility] device by [employer].This device is purchased by you and it connects to an application you download to your smart phone.  It can detect abnormal heart rhythms and alert you to contact your doctor for further evaluation. The device is approximately $90 and the phone application is free.  The web site is:  https://www.alivecor.com

## 2020-11-17 ENCOUNTER — Telehealth: Payer: Self-pay | Admitting: Cardiology

## 2020-11-17 ENCOUNTER — Other Ambulatory Visit: Payer: Self-pay | Admitting: Neurology

## 2020-11-17 MED ORDER — FUROSEMIDE 20 MG PO TABS: 20.0000 mg | ORAL_TABLET | ORAL | 1 refills | Status: DC | PRN

## 2020-11-17 NOTE — Telephone Encounter (Signed)
*  STAT* If patient is at the pharmacy, call can be transferred to refill team.   1. Which medications need to be refilled? (please list name of each medication and dose if known) furosemide (LASIX) 20 MG tablet  2. Which pharmacy/location (including street and city if local pharmacy) is medication to be sent to? [id number] - [place], [place] - [address]  3. Do they need a 30 day or 90 day supply? 30 ds

## 2020-11-17 NOTE — Telephone Encounter (Signed)
Rx(s) sent to pharmacy electronically.  

## 2020-11-20 ENCOUNTER — Ambulatory Visit: Payer: Medicare Other | Admitting: Neurology

## 2020-11-23 ENCOUNTER — Other Ambulatory Visit: Payer: Self-pay

## 2020-11-23 ENCOUNTER — Encounter (INDEPENDENT_AMBULATORY_CARE_PROVIDER_SITE_OTHER): Payer: Self-pay | Admitting: Ophthalmology

## 2020-11-23 ENCOUNTER — Ambulatory Visit (INDEPENDENT_AMBULATORY_CARE_PROVIDER_SITE_OTHER): Payer: Medicare Other | Admitting: Ophthalmology

## 2020-11-23 ENCOUNTER — Encounter (INDEPENDENT_AMBULATORY_CARE_PROVIDER_SITE_OTHER): Payer: Self-pay

## 2020-11-23 DIAGNOSIS — H18232 Secondary corneal edema, left eye: Secondary | ICD-10-CM

## 2020-11-23 DIAGNOSIS — H4052X2 Glaucoma secondary to other eye disorders, left eye, moderate stage: Secondary | ICD-10-CM | POA: Diagnosis not present

## 2020-11-23 MED ORDER — LATANOPROST 0.005 % OP SOLN: 1.0000 [drp] | Freq: Every day | OPHTHALMIC | 0 refills | Status: DC

## 2020-11-23 NOTE — Assessment & Plan Note (Signed)
,   Will commence with Combigan 1 drop left eye twice daily to control the intraocular pressure and hasten clearance of secondary corneal edema

## 2020-11-23 NOTE — Progress Notes (Signed)
11/23/2020     CHIEF COMPLAINT Patient presents for  Chief Complaint  Patient presents with   Retina Evaluation      HISTORY OF PRESENT ILLNESS: Samuel Barnes is a 69 y.o. male who presents to the clinic today for:   HPI     Retina Evaluation   In left eye.  This started 1 day ago.  Context:  distance vision.        Comments   NP- increased IOP OS Post cataract surgery, referred by Dr. Gershon Crane. Pt had cataract surgery yesterday, Wednesday 11/22/2020. Pt had OD surgery last week with no current issues, states left eye is bothering him. Blurred and pain OS. Pt is using 3 eye drops and is unsure what they are called. Pt states he has a bad headache, took 800mg  ibuprofen before leaving house around 1pm today, 4/10 pain scale. Cipro allergy.      Last edited by Laurin Coder on 11/23/2020  2:02 PM.      Referring physician: Manfred Shirts, PA [place],  [place] [id number]  HISTORICAL INFORMATION:   Selected notes from the MEDICAL RECORD NUMBER    Lab Results  Component Value Date   HGBA1C 5.7 (H) 07/30/2020     CURRENT MEDICATIONS: Current Outpatient Medications (Ophthalmic Drugs)  Medication Sig   latanoprost (XALATAN) 0.005 % ophthalmic solution Place 1 drop into the left eye daily for 28 days.   ketorolac (ACULAR) 0.5 % ophthalmic solution SMARTSIG:In Eye(s)   ofloxacin (OCUFLOX) 0.3 % ophthalmic solution    prednisoLONE acetate (PRED FORTE) 1 % ophthalmic suspension SMARTSIG:In Eye(s)   No current facility-administered medications for this visit. (Ophthalmic Drugs)   Current Outpatient Medications (Other)  Medication Sig   acetaminophen (TYLENOL) 500 MG tablet Take 1,000 mg by mouth every 6 (six) hours as needed (pain.).   albuterol (VENTOLIN HFA) 108 (90 Base) MCG/ACT inhaler Inhale 2 puffs into the lungs every 4 (four) hours as needed for wheezing or shortness of breath.   amiodarone (PACERONE) 200 MG tablet Take 1 tablet (200 mg) by  mouth twice daily till 08/17/2020 and then take 1 tablet (200 mg) once daily from 08/18/2020 onwards as per cardiology (Patient taking differently: Take 200 mg by mouth in the morning. Take 1 tablet (200 mg) by mouth twice daily till 08/17/2020 and then take 1 tablet (200 mg) once daily from 08/18/2020 onwards as per cardiology)   amLODipine (NORVASC) 5 MG tablet Take 1 tablet (5 mg total) by mouth daily.   apixaban (ELIQUIS) 5 MG TABS tablet Take 1 tablet (5 mg total) by mouth 2 (two) times daily.   Cyanocobalamin (B-12) 1000 MCG TABS Take 1 tablet (1,000 mcg total) by mouth daily.   FLUoxetine (PROZAC) 20 MG capsule Take 20 mg by mouth at bedtime.   folic acid (FOLVITE) 1 MG tablet Take 1 tablet (1 mg total) by mouth daily.   furosemide (LASIX) 20 MG tablet Take 1 tablet (20 mg total) by mouth as needed.   ketoconazole (NIZORAL) 2 % cream Apply 1 application topically 2 (two) times daily. GROIN AREA   lovastatin (MEVACOR) 20 MG tablet Take 20 mg by mouth daily.   metoprolol succinate (TOPROL XL) 25 MG 24 hr tablet Take 1 tablet (25 mg total) by mouth daily.   montelukast (SINGULAIR) 10 MG tablet Take 10 mg by mouth daily.   Multiple Vitamin (MULTIVITAMIN WITH MINERALS) TABS tablet Take 1 tablet by mouth daily.   polyethylene glycol (  MIRALAX / GLYCOLAX) 17 g packet Take 17 g by mouth daily as needed for mild constipation.   rivastigmine (EXELON) 1.5 MG capsule Take 2 capsules (3 mg total) by mouth 2 (two) times daily.   terazosin (HYTRIN) 5 MG capsule Take 5 mg by mouth in the morning.   thiamine 100 MG tablet Take 1 tablet (100 mg total) by mouth daily.   tiotropium (SPIRIVA) 18 MCG inhalation capsule Place 1 capsule (18 mcg total) into inhaler and inhale daily.   No current facility-administered medications for this visit. (Other)      REVIEW OF SYSTEMS:    ALLERGIES Allergies  Allergen Reactions   Ciprofloxacin Shortness Of Breath and Other (See Comments)    Numbness in extremities     Gabapentin Other (See Comments)    Mental problems, depression, anger    PAST MEDICAL HISTORY Past Medical History:  Diagnosis Date   Aortic atherosclerosis ([place]) 07/28/2020   BPH (benign prostatic hyperplasia)    Class 2 obesity 07/28/2020   Coronary artery calcification 07/28/2020   Depression    Family history of adverse reaction to anesthesia    Hyperlipidemia 07/28/2020   Hypertension    Tobacco use 07/28/2020   Past Surgical History:  Procedure Laterality Date   CARDIOVERSION N/A 08/02/2020   Procedure: CARDIOVERSION;  Surgeon: Freada Bergeron, MD;  Location: [facility];  Service: Cardiovascular;  Laterality: N/A;   CARDIOVERSION N/A 10/31/2020   Procedure: CARDIOVERSION;  Surgeon: Berniece Salines, DO;  Location: [facility];  Service: Cardiovascular;  Laterality: N/A;   IR ANGIO INTRA EXTRACRAN SEL COM CAROTID INNOMINATE BILAT MOD SED  10/04/2020   IR ANGIO VERTEBRAL SEL SUBCLAVIAN INNOMINATE BILAT MOD SED  10/03/2020   IR THORACENTESIS ASP PLEURAL SPACE W/IMG GUIDE  07/31/2020   IR US GUIDE VASC ACCESS RIGHT  10/04/2020   TEE WITHOUT CARDIOVERSION N/A 08/02/2020   Procedure: TRANSESOPHAGEAL ECHOCARDIOGRAM (TEE);  Surgeon: Freada Bergeron, MD;  Location: [facility] ENDOSCOPY;  Service: Cardiovascular;  Laterality: N/A;   TRANSURETHRAL RESECTION OF PROSTATE      FAMILY HISTORY Family History  Problem Relation Age of Onset   Coronary artery disease Mother    Hypertension Mother    Hyperlipidemia Mother    Ovarian cancer Mother    Ovarian cancer Sister    Liver cancer Sister    Liver cancer Brother     SOCIAL HISTORY Social History   Tobacco Use   Smoking status: Former    Types: Cigarettes    Start date: 1969    Quit date: 08/14/2020    Years since quitting: 0.2   Smokeless tobacco: Never  Vaping Use   Vaping Use: Never used  Substance Use Topics   Alcohol use: Not Currently    Alcohol/week: 1.0 standard drink    Types: 1 Shots of liquor per week   Drug  use: Never         OPHTHALMIC EXAM:  Base Eye Exam     Visual Acuity (ETDRS)       Right Left   Dist [place] 20/30 20/400   Dist ph [place]  20/100 -1  Pt states for left eye: "letters are drifting off the screen."        Tonometry (Tonopen, 1:58 PM)       Right Left   Pressure 27 26         Pupils       Pupils Dark Light Shape React APD   Right PERRL 4 3 Round Brisk None  Left PERRL 5 5 Round Minimal None  OS cloudy view.        Visual Fields (Counting fingers)       Left Right     Full   Restrictions Total inferior temporal, inferior nasal deficiencies; Partial outer superior temporal, superior nasal deficiencies          Extraocular Movement       Right Left    Full Full         Neuro/Psych     Oriented x3: Yes   Mood/Affect: Normal         Dilation     Both eyes: 1.0% Mydriacyl, 2.5% Phenylephrine @ 1:58 PM           Slit Lamp and Fundus Exam     External Exam       Right Left   External Normal Normal         Slit Lamp Exam       Right Left   Lids/Lashes Normal Normal   Conjunctiva/Sclera White and quiet 1+ Injection   Cornea Old RK incisions cornea clear Old RK incision central corneal stromal edema and 1+ to 2+ epi edema   Anterior Chamber Deep and quiet No cells no debris   Iris Round and reactive Round and reactive   Lens Centered posterior chamber intraocular lens Centered posterior chamber intraocular lens, appears centrally, using reflected light and Purkinje image imaging   Anterior Vitreous Normal Normal         Fundus Exam       Right Left   Posterior Vitreous Posterior vitreous detachment Clear no debris   Disc Normal No details   C/D Ratio 0.3 No details   Macula Normal No details   Vessels Normal    Periphery Normal attached no dislocated lens seen            IMAGING AND PROCEDURES  Imaging and Procedures for 11/23/20  B-Scan Ultrasound - OS - Left Eye       Quality was good.   Notes Poor  view due to corneal edema, looking for possibility of dislocated lens  No intraocular foreign body normal vitreous strands     OCT, Retina - OU - Both Eyes       Right Eye Quality was good. Scan locations included subfoveal. Central Foveal Thickness: 298. Progression has no prior data. Findings include normal foveal contour.   Left Eye Quality was poor.   Notes 4 view OS 3 corneal edema     Color Fundus Photography Optos - OU - Both Eyes       Right Eye Progression has no prior data. Disc findings include normal observations. Macula : normal observations.              ASSESSMENT/PLAN:  Secondary glaucoma, left, moderate stage , Will commence with Combigan 1 drop left eye twice daily to control the intraocular pressure and hasten clearance of secondary corneal edema      ICD-10-CM   1. Secondary corneal edema of left eye  H18.232 B-Scan Ultrasound - OS - Left Eye    OCT, Retina - OU - Both Eyes    Color Fundus Photography Optos - OU - Both Eyes    2. Secondary glaucoma, left, moderate stage  H40.52X2 OCT, Retina - OU - Both Eyes    Color Fundus Photography Optos - OU - Both Eyes      1.  OS with secondary corneal edema, post cataract surgery with  secondary elevated intraocular pressure likely secondary to retained viscoelastic from the time of surgery.  2.  We will need to commence with therapy topically to lower the intraocular pressure after patient was giving 1 drop of Combigan at Dr. Kellie Moor office today to begin the lower the pressure  3.  By my clinical examination OS today and by testing at 20/400 vision, it is apparent that the intraocular lens in the left eye is well centered as visualized by using direct and indirect visualization techniques including Purkinje images  B-scan  also was done confirming that no intraocular foreign body is visible in the vitreous cavity  Ophthalmic Meds Ordered this visit:  Meds ordered this encounter  Medications    latanoprost (XALATAN) 0.005 % ophthalmic solution    Sig: Place 1 drop into the left eye daily for 28 days.    Dispense:  2.5 mL    Refill:  0       Return for Follow-up here as needed Dr. Gershon Crane.  There are no Patient Instructions on file for this visit.   Explained the diagnoses, plan, and follow up with the patient and they expressed understanding.  Patient expressed understanding of the importance of proper follow up care.   Clent Demark Cord Wilczynski M.D. Diseases & Surgery of the Retina and Vitreous [facility] 11/23/20     Abbreviations: M myopia (nearsighted); A astigmatism; H hyperopia (farsighted); P presbyopia; Mrx spectacle prescription;  CTL contact lenses; OD right eye; OS left eye; OU both eyes  XT exotropia; ET esotropia; PEK punctate epithelial keratitis; PEE punctate epithelial erosions; DES dry eye syndrome; MGD meibomian gland dysfunction; ATs artificial tears; PFAT's preservative free artificial tears; [place] nuclear sclerotic cataract; PSC posterior subcapsular cataract; ERM epi-retinal membrane; PVD posterior vitreous detachment; RD retinal detachment; DM diabetes mellitus; DR diabetic retinopathy; NPDR non-proliferative diabetic retinopathy; PDR proliferative diabetic retinopathy; CSME clinically significant macular edema; DME diabetic macular edema; dbh dot blot hemorrhages; CWS cotton wool spot; POAG primary open angle glaucoma; C/D cup-to-disc ratio; HVF humphrey visual field; GVF goldmann visual field; OCT optical coherence tomography; IOP intraocular pressure; BRVO Branch retinal vein occlusion; CRVO central retinal vein occlusion; CRAO central retinal artery occlusion; BRAO branch retinal artery occlusion; RT retinal tear; SB scleral buckle; PPV pars plana vitrectomy; VH Vitreous hemorrhage; PRP panretinal laser photocoagulation; IVK intravitreal kenalog; VMT vitreomacular traction; MH Macular hole;  NVD neovascularization of the disc; NVE neovascularization  elsewhere; AREDS age related eye disease study; ARMD age related macular degeneration; POAG primary open angle glaucoma; EBMD epithelial/anterior basement membrane dystrophy; ACIOL anterior chamber intraocular lens; IOL intraocular lens; PCIOL posterior chamber intraocular lens; Phaco/IOL phacoemulsification with intraocular lens placement; [place] photorefractive keratectomy; LASIK laser assisted in situ keratomileusis; HTN hypertension; DM diabetes mellitus; COPD chronic obstructive pulmonary disease

## 2020-12-09 ENCOUNTER — Other Ambulatory Visit: Payer: Self-pay | Admitting: Cardiology

## 2020-12-13 ENCOUNTER — Other Ambulatory Visit (INDEPENDENT_AMBULATORY_CARE_PROVIDER_SITE_OTHER): Payer: Self-pay | Admitting: Ophthalmology

## 2020-12-21 ENCOUNTER — Other Ambulatory Visit (INDEPENDENT_AMBULATORY_CARE_PROVIDER_SITE_OTHER): Payer: Self-pay | Admitting: Ophthalmology

## 2020-12-21 ENCOUNTER — Telehealth (HOSPITAL_BASED_OUTPATIENT_CLINIC_OR_DEPARTMENT_OTHER): Payer: Self-pay

## 2020-12-21 ENCOUNTER — Other Ambulatory Visit: Payer: Self-pay | Admitting: Neurosurgery

## 2020-12-21 ENCOUNTER — Other Ambulatory Visit (HOSPITAL_BASED_OUTPATIENT_CLINIC_OR_DEPARTMENT_OTHER): Payer: Self-pay | Admitting: Neurosurgery

## 2020-12-21 DIAGNOSIS — Q2730 Arteriovenous malformation, site unspecified: Secondary | ICD-10-CM

## 2020-12-22 NOTE — Pre-Procedure Instructions (Signed)
Surgical Instructions    Your procedure is scheduled on Tuesday 12/26/20.   Report to [facility] Main Entrance "A" at 05:30 A.M., then check in with the Admitting office.  Call this number if you have problems the morning of surgery:  [phone]   If you have any questions prior to your surgery date call [phone]: Open Monday-Friday 8am-4pm    Remember:  Do not eat after midnight the night before your surgery  You may drink clear liquids until 04:30 A.M. the morning of your surgery.   Clear liquids allowed are: Water, Non-Citrus Juices (without pulp), Carbonated Beverages, Clear Tea, Black Coffee ONLY (NO MILK, CREAM OR POWDERED CREAMER of any kind), and Gatorade    Take these medicines the morning of surgery with A SIP OF WATER   amiodarone (PACERONE)  amLODipine (NORVASC)  lovastatin (MEVACOR)  metoprolol succinate (TOPROL XL)  montelukast (SINGULAIR)   terazosin (HYTRIN)  rivastigmine (EXELON)   Take these medicines if needed:   acetaminophen (TYLENOL)  albuterol (VENTOLIN HFA)  tiotropium (SPIRIVA)  Please follow your surgeons' instructions regarding apixaban (ELIQUIS). If you have not received instructions then you need to contact your surgeon's office for instructions.   As of today, STOP taking any Aspirin (unless otherwise instructed by your surgeon) Aleve, Naproxen, Ibuprofen, Motrin, Advil, Goody's, BC's, all herbal medications, fish oil, and all vitamins.     After your COVID test   You are not required to quarantine however you are required to wear a well-fitting mask when you are out and around people not in your household.  If your mask becomes wet or soiled, replace with a new one.  Wash your hands often with soap and water for 20 seconds or clean your hands with an alcohol-based hand sanitizer that contains at least 60% alcohol.  Do not share personal items.  Notify your provider: if you are in close contact with someone who has COVID  or if you  develop a fever of 100.4 or greater, sneezing, cough, sore throat, shortness of breath or body aches.             Do not wear jewelry or makeup Do not wear lotions, powders, perfumes/colognes, or deodorant. Do not shave 48 hours prior to surgery.  Men may shave face and neck. Do not bring valuables to the hospital. DO Not wear nail polish, gel polish, artificial nails, or any other type of covering on natural nails including finger and toenails. If patients have artificial nails, gel coating, etc. that need to be removed by a nail salon, please have this removed prior to surgery or surgery may need to be canceled/delayed if the surgeon/ anesthesia feels like the patient is unable to be adequately monitored.             [facility] is not responsible for any belongings or valuables.  Do NOT Smoke (Tobacco/Vaping)  24 hours prior to your procedure  If you use a CPAP at night, you may bring your mask for your overnight stay.   Contacts, glasses, hearing aids, dentures or partials may not be worn into surgery, please bring cases for these belongings   For patients admitted to the hospital, discharge time will be determined by your treatment team.   Patients discharged the day of surgery will not be allowed to drive home, and someone needs to stay with them for 24 hours.  NO VISITORS WILL BE ALLOWED IN PRE-OP WHERE PATIENTS ARE PREPPED FOR SURGERY.  ONLY 1 SUPPORT PERSON  MAY BE PRESENT IN THE WAITING ROOM WHILE YOU ARE IN SURGERY.  IF YOU ARE TO BE ADMITTED, ONCE YOU ARE IN YOUR ROOM YOU WILL BE ALLOWED TWO (2) VISITORS. 1 (ONE) VISITOR MAY STAY OVERNIGHT BUT MUST ARRIVE TO THE ROOM BY 8pm.  Minor children may have two parents present. Special consideration for safety and communication needs will be reviewed on a case by case basis.  Special instructions:    Oral Hygiene is also important to reduce your risk of infection.  Remember - BRUSH YOUR TEETH THE MORNING OF SURGERY WITH YOUR REGULAR  TOOTHPASTE   [facility]- Preparing For Surgery  Before surgery, you can play an important role. Because skin is not sterile, your skin needs to be as free of germs as possible. You can reduce the number of germs on your skin by washing with CHG (chlorahexidine gluconate) Soap before surgery.  CHG is an antiseptic cleaner which kills germs and bonds with the skin to continue killing germs even after washing.     Please do not use if you have an allergy to CHG or antibacterial soaps. If your skin becomes reddened/irritated stop using the CHG.  Do not shave (including legs and underarms) for at least 48 hours prior to first CHG shower. It is OK to shave your face.  Please follow these instructions carefully.     Shower the NIGHT BEFORE SURGERY and the MORNING OF SURGERY with CHG Soap.   If you chose to wash your hair, wash your hair first as usual with your normal shampoo. After you shampoo, rinse your hair and body thoroughly to remove the shampoo.  Then [employer] and genitals (private parts) with your normal soap and rinse thoroughly to remove soap.  After that Use CHG Soap as you would any other liquid soap. You can apply CHG directly to the skin and wash gently with a scrungie or a clean washcloth.   Apply the CHG Soap to your body ONLY FROM THE NECK DOWN.  Do not use on open wounds or open sores. Avoid contact with your eyes, ears, mouth and genitals (private parts). Wash Face and genitals (private parts)  with your normal soap.   Wash thoroughly, paying special attention to the area where your surgery will be performed.  Thoroughly rinse your body with warm water from the neck down.  DO NOT shower/wash with your normal soap after using and rinsing off the CHG Soap.  Pat yourself dry with a CLEAN TOWEL.  Wear CLEAN PAJAMAS to bed the night before surgery  Place CLEAN SHEETS on your bed the night before your surgery  DO NOT SLEEP WITH PETS.   Day of Surgery:  Take a shower  with CHG soap. Wear Clean/Comfortable clothing the morning of surgery Do not apply any deodorants/lotions.   Remember to brush your teeth WITH YOUR REGULAR TOOTHPASTE.   Please read over the following fact sheets that you were given.

## 2020-12-23 ENCOUNTER — Other Ambulatory Visit: Payer: Self-pay

## 2020-12-23 ENCOUNTER — Ambulatory Visit (HOSPITAL_BASED_OUTPATIENT_CLINIC_OR_DEPARTMENT_OTHER)
Admission: RE | Admit: 2020-12-23 | Discharge: 2020-12-23 | Disposition: A | Discharge status: Home / Self Care | Payer: Medicare Other | Source: Ambulatory Visit | Attending: Neurosurgery | Admitting: Neurosurgery

## 2020-12-23 DIAGNOSIS — Q2730 Arteriovenous malformation, site unspecified: Secondary | ICD-10-CM

## 2020-12-23 MED ORDER — GADOBUTROL 1 MMOL/ML IV SOLN
9.0000 mL | Freq: Once | INTRAVENOUS | Status: AC | PRN
  Administered 2020-12-23: 9 mL via INTRAVENOUS

## 2020-12-25 ENCOUNTER — Encounter (HOSPITAL_COMMUNITY)
Admission: RE | Admit: 2020-12-25 | Discharge: 2020-12-25 | Disposition: A | Discharge status: Home / Self Care | Payer: Medicare Other | Source: Ambulatory Visit | Attending: Neurosurgery | Admitting: Neurosurgery

## 2020-12-25 ENCOUNTER — Other Ambulatory Visit: Payer: Self-pay

## 2020-12-25 ENCOUNTER — Encounter (HOSPITAL_COMMUNITY): Payer: Self-pay

## 2020-12-25 VITALS — BP 140/80 | HR 60 | Temp 97.8°F | Resp 18

## 2020-12-25 DIAGNOSIS — Z87891 Personal history of nicotine dependence: Secondary | ICD-10-CM | POA: Insufficient documentation

## 2020-12-25 DIAGNOSIS — I110 Hypertensive heart disease with heart failure: Secondary | ICD-10-CM | POA: Insufficient documentation

## 2020-12-25 DIAGNOSIS — I5030 Unspecified diastolic (congestive) heart failure: Secondary | ICD-10-CM | POA: Insufficient documentation

## 2020-12-25 DIAGNOSIS — I4891 Unspecified atrial fibrillation: Secondary | ICD-10-CM | POA: Insufficient documentation

## 2020-12-25 DIAGNOSIS — Q282 Arteriovenous malformation of cerebral vessels: Secondary | ICD-10-CM | POA: Insufficient documentation

## 2020-12-25 DIAGNOSIS — Z01812 Encounter for preprocedural laboratory examination: Secondary | ICD-10-CM | POA: Insufficient documentation

## 2020-12-25 DIAGNOSIS — E785 Hyperlipidemia, unspecified: Secondary | ICD-10-CM | POA: Insufficient documentation

## 2020-12-25 DIAGNOSIS — Z01818 Encounter for other preprocedural examination: Secondary | ICD-10-CM

## 2020-12-25 DIAGNOSIS — J449 Chronic obstructive pulmonary disease, unspecified: Secondary | ICD-10-CM | POA: Insufficient documentation

## 2020-12-25 DIAGNOSIS — Z20822 Contact with and (suspected) exposure to covid-19: Secondary | ICD-10-CM | POA: Insufficient documentation

## 2020-12-25 HISTORY — DX: Unspecified dementia, unspecified severity, without behavioral disturbance, psychotic disturbance, mood disturbance, and anxiety: F03.90

## 2020-12-25 HISTORY — DX: Anxiety disorder, unspecified: F41.9

## 2020-12-25 HISTORY — DX: Unspecified atrial fibrillation: I48.91

## 2020-12-25 HISTORY — DX: Heart failure, unspecified: I50.9

## 2020-12-25 HISTORY — DX: Chronic obstructive pulmonary disease, unspecified: J44.9

## 2020-12-25 LAB — CBC
HCT: 40.8 % (ref 39.0–52.0)
Hemoglobin: 13.1 g/dL (ref 13.0–17.0)
MCH: 31.2 pg (ref 26.0–34.0)
MCHC: 32.1 g/dL (ref 30.0–36.0)
MCV: 97.1 fL (ref 80.0–100.0)
Platelets: 188 10*3/uL (ref 150–400)
RBC: 4.2 MIL/uL — ABNORMAL LOW (ref 4.22–5.81)
RDW: 14 % (ref 11.5–15.5)
WBC: 5.4 10*3/uL (ref 4.0–10.5)
nRBC: 0 % (ref 0.0–0.2)

## 2020-12-25 LAB — BASIC METABOLIC PANEL
Anion gap: 7 (ref 5–15)
BUN: 19 mg/dL (ref 8–23)
CO2: 25 mmol/L (ref 22–32)
Calcium: 9.1 mg/dL (ref 8.9–10.3)
Chloride: 98 mmol/L (ref 98–111)
Creatinine, Ser: 0.67 mg/dL (ref 0.61–1.24)
GFR, Estimated: >60 mL/min (ref >60)
Glucose, Bld: 87 mg/dL (ref 70–99)
Potassium: 4.7 mmol/L (ref 3.5–5.1)
Sodium: 130 mmol/L — ABNORMAL LOW (ref 135–145)

## 2020-12-25 LAB — TYPE AND SCREEN
ABO/RH(D): B POS
Antibody Screen: NEGATIVE

## 2020-12-25 LAB — SARS CORONAVIRUS 2 (TAT 6-24 HRS): SARS Coronavirus 2: NEGATIVE

## 2020-12-25 NOTE — Progress Notes (Signed)
PCP - Samuel Barnes Cardiologist - Dr. Armanda Magic  PPM/ICD - n/a Device Orders - n/a Rep Notified - n/a  Chest x-ray - 08/20/20 EKG - 11/14/20 Stress Test - 07/30/20 ECHO - 07/29/20 Cardiac Cath - denies  Sleep Study - denies CPAP - n/a  Fasting Blood Sugar - n/a Checks Blood Sugar _____ times a day- n/a  Blood Thinner Instructions: Patient states he was instructed to stop taking Eliquis. Last dose was on Friday 12/22/20. Aspirin Instructions: n/a  ERAS Protcol - Yes PRE-SURGERY Ensure or G2- No  COVID TEST- 12/25/20. Pending.    Anesthesia review: Yes. Message sent to Hetty Ely, PA.   Patient denies shortness of breath, fever, cough and chest pain at PAT appointment   All instructions explained to the patient, with a verbal understanding of the material. Patient agrees to go over the instructions while at home for a better understanding. Patient also instructed to self quarantine after being tested for COVID-19. The opportunity to ask questions was provided.

## 2020-12-25 NOTE — Progress Notes (Signed)
Anesthesia Chart Review:   Case: [id number] Date/Time: 12/26/20 0715   Procedures:      CRANIOTOMY INTRACRANIAL ARTERIO-VENOUS MALFORMATION DURAL COMPLEX (AVM) (Left)     APPLICATION OF CRANIAL NAVIGATION - RM 21   Anesthesia type: General   Pre-op diagnosis: Arteriovenous malformation   Location: [facility] ROOM 21 / [facility] OR   Surgeons: [name], MD       DISCUSSION: Patient is a 70 year old male scheduled for the above procedure.  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   For anesthesia history he/daughter reported that patient's father had a heart attack after a drug was administered. They could not recall the name, but said it was an older drug that is no longer used in anesthesia.  The only prior anesthesia record found was from 06/29/14 TURP at [facility] (see Care Everywhere), spinal anesthesia and succinylcholine, propofol, fentanyl, Versed were among the agents used.  Three [place] admissions and one ED visit since late July are summarized below: - [place] admission 09/27/20-10/04/20 for near syncope. Head CT showed 7 mm acute parenchymal bleed with surrounding edema within the left occipital lobe. ICH stable on 09/29/20 MRI, but appearance suggestive of AVM.  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 Neurology and Neurosurgery to follow out-patient. He was cleared to resume Eliquis.   - [place] admission 8/8/282-08/21/20 acute metabolic encephalopathy after daughter found him sitting on his deck and not responding verbally, question of syncope. EKG showed rate controlled afib. Mild AKI by labs. No acute CHF. MRI, EEG, and carotid US ordered. Neurology consulted. MRI showed no acute intracranial pathology, no evidence of Wernicke Encephalopathy, mild chronic white matter microangiopathy. EEG was normal. Carotid US showed no significant (1-39%) ICA stenosis. Although imaging did not show Wernicke's encephalopathy, he was treated with high dose IV Thiamine and had improvement.   [facility] ED evaluation 08/11/20 for altered mental status/intermittent confusion for last several days. Had been a daily alcohol drinker, but had not had alcohol in ~ 2 weeks. Per note, "No significant leukocytosis, anemia, electrolyte abnormality.  No kidney injury.  Troponin and BNP overall unremarkable.  COVID test and flu test negative.  Urinalysis negative for infection.  Chest x-ray with no major acute changes.  No concern for pneumonia.  CT scan with no head bleed.  There is some cerebral atrophy.  Overall work-up is unremarkable.  Suspect that there is some underlying dementia now with some mild behavioral disturbance." He was discharged home with PCP follow-up and return precautions.    - [place] admission 07/28/20-08/08/20 for acute diastolic CHF and paroxsymal new onset afib with pleural effusions (R > L) requiring supplement O2 (up to 8L).  He was started on IV Lasix and heparin.  Cardiology was consulted. S/p right thoracentesis.  TEE/DCCV 08/02/20.  Symptoms improved with diuresis and cardioversion. He was discharged on oral Lasix, Eliquis, and O2 3L/Cucumber. Known heavy  alcohol intake, no signs of withdrawal.   Last cardiology follow-up 11/14/20 with Laurann Montana, NP following  unsuccessful cardioversion 10/31/20. He had since self-converted, likely 4-5 days prior based on HR trends. .  Edema improved so Lasix changed to as needed.  Continue amiodarone.  No anginal symptoms, and no ischemic testing recommended at that time (low risk stress test 07/2020).  He reported complete abstinence from alcohol and tobacco since discharge.  Continue follow-up with neurology for mild cognitive impairment.  Follow-up with cardiology in 4 months.  He reported instructions to hold Eliquis after 12/22/20 dose.  12/25/2020 presurgical COVID-19 test negative.  Anesthesia team to evaluate on the day of surgery.         VS: BP 140/80 Comment: manual   Pulse 60    Temp 36.6 C (Oral)    Resp 18    SpO2 100%    PROVIDERS: Manfred Shirts, PA is PCP - Fransico Him, MD is cardiologist - Rutherford Guys, MD is ophthalmologist. S/p recent cataract extractions. Referred to retinal specialist Dr. 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 Xalatan eye drops x 28 day added. He last saw Dr. Gershon Crane on 12/19/20.  Patient doing well, discontinued prescription drops and Muro 128. He is ware of plans for craniotomy.  Alric Ran, MD is neurologist   LABS: Labs reviewed: Acceptable for surgery. A1c 5.9% 08/30/20 ([facility]). (all labs ordered are listed, but only abnormal results are displayed)  Labs Reviewed  BASIC METABOLIC PANEL - Abnormal; Notable for the following components:      Result Value   Sodium 130 (*)    All other components within normal limits  CBC - Abnormal; Notable for the following components:   RBC 4.20 (*)    All other components within normal limits  SARS CORONAVIRUS 2 (TAT 6-24 HRS)  TYPE AND SCREEN    OTHER: PFTs 09/27/20: FVC 2.51 (59%), post 2.79 (66%). FEV1 1.52 (49%), post 1.68 (54%). DLCO unc 129.29 (77%), cor 18.87 (75%).  Overnight EEG with video 08/15/20-08/16/20: IMPRESSION: This  study is within normal limits. No seizures or epileptiform discharges were seen throughout the recording.    IMAGES: MRI Brain 12/23/20: IMPRESSION: 1. Small amount of chronic blood products in the left occipital lobe at the site of the prior hemorrhage. Resolved edema. 2. Decreased size/conspicuity of the dilated vein in the left occipital region corresponding to the dural AV fistula diagnosed with prior angiography. 3. No acute intracranial abnormality. 4. Mild chronic small vessel ischemic disease.   Diagnostic Cerebral Angiogram 10/03/20: IMPRESSION: Left occipital dural AV fistula supplied by the posterior meningeal artery and draining into a dilated cortical vein with focal stenosis, likely the source of patient's known left occipital hemorrhage. The dilated vein corresponds to the vessel described on prior CT angiogram and MRI of the brain.   CTA Head/Neck 09/29/20: IMPRESSION: - Similar small area of recent hemorrhage in the left occipital lobe with slightly increased surrounding edema. - Prominent draining vein in the left occipital lobe in proximity area of hemorrhage. No evidence of a nidus. 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 Otherwise, NIR consultation suggested to evaluate utility of catheter angiography. - Plaque along the right common carotid causing nearly 50% stenosis. Heavily calcified plaque just below the left common carotid bifurcation causing 65% stenosis. Less than 50% stenosis along the proximal internal carotids. - Calcified  plaque at the right vertebral origin causing at least moderate stenosis.    EKG: 11/14/20: SB at 59 bpm   CV: US Carotid 08/14/20:  Summary:  - Right Carotid: Velocities in the right ICA are consistent with a 1-39%  stenosis.  - Left Carotid: Velocities in the left ICA are consistent with a 1-39%  stenosis.  - Vertebrals: Bilateral vertebral  arteries demonstrate antegrade flow.    TEE/DCCV 08/02/20: IMPRESSIONS   1. Left ventricular ejection fraction, by estimation, is 55 to 60%. The  left ventricle has normal function.   2. Right ventricular systolic function is normal. The right ventricular  size is normal.   3. Left atrial size was mild to moderately dilated. No left atrial/left  atrial appendage thrombus was detected.   4. Right atrial size was moderately dilated.   5. The mitral valve is normal in structure. Mild mitral valve  regurgitation. No evidence of mitral stenosis.   6. The aortic valve is tricuspid. There is mild calcification of the  aortic valve. There is mild thickening of the aortic valve. Aortic valve  regurgitation is not visualized. Mild aortic valve sclerosis is present,  with no evidence of aortic valve  stenosis.   7. There is Moderate (Grade III) plaque.   8. Successful DCCV performed (200J x1) after TEE with return to NSR.    Nuclear stress test 07/30/20:  IMPRESSION: 1. 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 No definite evidence of reversibility is noted to suggest acute ischemia. 2. Normal left ventricular wall motion. 3. Left ventricular ejection fraction 65% 4. Non invasive risk stratification: Low   Echo (TTE) 07/29/20: IMPRESSIONS   1. Left ventricular ejection fraction, by estimation, is 55 to 60%. The  left ventricle has normal function. The left ventricle has no regional  wall motion abnormalities. Left ventricular diastolic function could not  be evaluated.   2. Prominent moderator band of no clinical significance. Right  ventricular systolic function is mildly reduced. The right ventricular  size is normal. There is mildly elevated pulmonary artery systolic  pressure. The estimated right ventricular systolic  pressure is [id number] mmHg.   3. The mitral valve is normal in structure. No evidence of mitral valve   regurgitation. No evidence of mitral stenosis.   4. The aortic valve is normal in structure. Aortic valve regurgitation is  not visualized. No aortic stenosis is present.   5. The inferior vena cava is dilated in size with <50% respiratory  variability, suggesting right atrial pressure of 15 mmHg.     Past Medical History:  Diagnosis Date   Anxiety    Aortic atherosclerosis ([place]) 07/28/2020   Atrial fibrillation (HCC)    BPH (benign prostatic hyperplasia)    CHF (congestive heart failure) (HCC)    Class 2 obesity 07/28/2020   COPD (chronic obstructive pulmonary disease) (HCC)    Coronary artery calcification 07/28/2020   Dementia ([place])    Depression    Family history of adverse reaction to anesthesia    Hyperlipidemia 07/28/2020   Hypertension    ICH (intracerebral hemorrhage) ([place]) 09/27/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   Tobacco use 07/28/2020    Past Surgical History:  Procedure Laterality Date   CARDIOVERSION N/A 08/02/2020   Procedure: CARDIOVERSION;  Surgeon: Freada Bergeron, MD;  Location: [place];  Service: Cardiovascular;  Laterality: N/A;   CARDIOVERSION N/A 10/31/2020   Procedure: CARDIOVERSION;  Surgeon: Berniece Salines,  DO;  Location: [place];  Service: Cardiovascular;  Laterality: N/A;   CATARACT EXTRACTION Bilateral 11/2020   IR ANGIO INTRA EXTRACRAN SEL COM CAROTID INNOMINATE BILAT MOD SED  10/04/2020   IR ANGIO VERTEBRAL SEL SUBCLAVIAN INNOMINATE BILAT MOD SED  10/03/2020   IR THORACENTESIS ASP PLEURAL SPACE W/IMG GUIDE  07/31/2020   IR US GUIDE VASC ACCESS RIGHT  10/04/2020   TEE WITHOUT CARDIOVERSION N/A 08/02/2020   Procedure: TRANSESOPHAGEAL ECHOCARDIOGRAM (TEE);  Surgeon: Freada Bergeron, MD;  Location: [facility] ENDOSCOPY;  Service: Cardiovascular;  Laterality: N/A;   TRANSURETHRAL RESECTION OF PROSTATE      MEDICATIONS:  acetaminophen (TYLENOL) 500 MG tablet   albuterol (VENTOLIN HFA) 108 (90 Base) MCG/ACT  inhaler   amiodarone (PACERONE) 200 MG tablet   amLODipine (NORVASC) 5 MG tablet   apixaban (ELIQUIS) 5 MG TABS tablet   Cyanocobalamin (B-12) 1000 MCG TABS   FLUoxetine (PROZAC) 20 MG capsule   folic acid (FOLVITE) 1 MG tablet   furosemide (LASIX) 20 MG tablet   ketoconazole (NIZORAL) 2 % cream   ketorolac (ACULAR) 0.5 % ophthalmic solution   latanoprost (XALATAN) 0.005 % ophthalmic solution   lovastatin (MEVACOR) 20 MG tablet   metoprolol succinate (TOPROL XL) 25 MG 24 hr tablet   montelukast (SINGULAIR) 10 MG tablet   Multiple Vitamin (MULTIVITAMIN WITH MINERALS) TABS tablet   polyethylene glycol (MIRALAX / GLYCOLAX) 17 g packet   rivastigmine (EXELON) 1.5 MG capsule   terazosin (HYTRIN) 5 MG capsule   thiamine 100 MG tablet   tiotropium (SPIRIVA) 18 MCG inhalation capsule   No current facility-administered medications for this encounter.    Myra Gianotti, PA-C Surgical Short Stay/Anesthesiology [facility] Phone [phone] [facility] Phone [phone] 12/25/2020 5:53 PM

## 2020-12-25 NOTE — Anesthesia Preprocedure Evaluation (Addendum)
Anesthesia Evaluation  Patient identified by MRN, date of birth, ID band Patient awake    Reviewed: Allergy & Precautions, NPO status , Patient's Chart, lab work & pertinent test results  Airway Mallampati: I  TM Distance: >3 FB Neck ROM: Full    Dental  (+) Edentulous Upper, Missing   Pulmonary COPD (controlled),  COPD inhaler, former smoker,    Pulmonary exam normal breath sounds clear to auscultation       Cardiovascular hypertension, Pt. on medications and Pt. on home beta blockers + CAD and +CHF  Normal cardiovascular exam+ dysrhythmias Atrial Fibrillation  Rhythm:Regular Rate:Normal  ECG: SB, rate 59  ECHO: Left ventricular ejection fraction, by estimation, is 55 to 60%. The left ventricle has normal function.  Right ventricular systolic function is normal. The right ventricular size is normal.  Left atrial size was mild to moderately dilated. No left atrial/left atrial appendage thrombus was detected.  Right atrial size was moderately dilated.  The mitral valve is normal in structure. Mild mitral valve  regurgitation. No evidence of mitral stenosis.  The aortic valve is tricuspid. There is mild calcification of the aortic valve. There is mild thickening of the aortic valve. Aortic valve regurgitation is not visualized. Mild aortic valve sclerosis is present, with no evidence of aortic valve stenosis.  There is Moderate (Grade III) plaque.  Successful DCCV performed (200J x1) after TEE with return to NSR.  Please see separate note.    Neuro/Psych PSYCHIATRIC DISORDERS Anxiety Depression Dementia ICH (intracerebral hemorrhage)    GI/Hepatic negative GI ROS, Neg liver ROS,   Endo/Other  negative endocrine ROS  Renal/GU negative Renal ROS     Musculoskeletal negative musculoskeletal ROS (+)   Abdominal   Peds  Hematology HLD   Anesthesia Other Findings Arteriovenous malformation  Reproductive/Obstetrics                            Anesthesia Physical Anesthesia Plan  ASA: 3  Anesthesia Plan: General   Post-op Pain Management:    Induction: Intravenous  PONV Risk Score and Plan: 2 and Ondansetron, Dexamethasone and Treatment may vary due to age or medical condition  Airway Management Planned: Oral ETT  Additional Equipment: Arterial line  Intra-op Plan:   Post-operative Plan: Extubation in OR  Informed Consent: I have reviewed the patients History and Physical, chart, labs and discussed the procedure including the risks, benefits and alternatives for the proposed anesthesia with the patient or authorized representative who has indicated his/her understanding and acceptance.     Dental advisory given  Plan Discussed with: CRNA  Anesthesia Plan Comments: (PAT note written 12/25/2020 by Shonna Chock, PA-C.  For anesthesia history he/daughter reported that patient's father had a heart attack after a drug was administered. They could not recall the name, but said it was an older drug that is no longer used in anesthesia.  The only prior anesthesia record found was from 06/29/14 TURP at [facility] (see Care Everywhere), spinal anesthesia and succinylcholine, propofol, fentanyl, Versed were among the agents used.   Potential central line placement discussed)      Anesthesia Quick Evaluation

## 2020-12-26 ENCOUNTER — Inpatient Hospital Stay (HOSPITAL_COMMUNITY)
Admission: RE | Admit: 2020-12-26 | Discharge: 2020-12-28 | DRG: 027 | Disposition: A | Discharge status: Home / Self Care | Payer: Medicare Other | Attending: Neurosurgery | Admitting: Neurosurgery

## 2020-12-26 ENCOUNTER — Inpatient Hospital Stay (HOSPITAL_COMMUNITY)
Admission: RE | Disposition: A | Discharge status: Home / Self Care | Payer: Self-pay | Source: Ambulatory Visit | Attending: Neurosurgery

## 2020-12-26 ENCOUNTER — Inpatient Hospital Stay (HOSPITAL_COMMUNITY): Payer: Medicare Other | Admitting: Certified Registered Nurse Anesthetist

## 2020-12-26 ENCOUNTER — Encounter (HOSPITAL_COMMUNITY): Payer: Self-pay | Admitting: Neurosurgery

## 2020-12-26 ENCOUNTER — Inpatient Hospital Stay (HOSPITAL_COMMUNITY): Payer: Medicare Other | Admitting: Vascular Surgery

## 2020-12-26 DIAGNOSIS — Z8249 Family history of ischemic heart disease and other diseases of the circulatory system: Secondary | ICD-10-CM | POA: Diagnosis not present

## 2020-12-26 DIAGNOSIS — Z881 Allergy status to other antibiotic agents status: Secondary | ICD-10-CM | POA: Diagnosis not present

## 2020-12-26 DIAGNOSIS — Z9889 Other specified postprocedural states: Secondary | ICD-10-CM

## 2020-12-26 DIAGNOSIS — E785 Hyperlipidemia, unspecified: Secondary | ICD-10-CM | POA: Diagnosis present

## 2020-12-26 DIAGNOSIS — Z8041 Family history of malignant neoplasm of ovary: Secondary | ICD-10-CM

## 2020-12-26 DIAGNOSIS — I10 Essential (primary) hypertension: Secondary | ICD-10-CM | POA: Diagnosis present

## 2020-12-26 DIAGNOSIS — Z8673 Personal history of transient ischemic attack (TIA), and cerebral infarction without residual deficits: Secondary | ICD-10-CM | POA: Diagnosis not present

## 2020-12-26 DIAGNOSIS — I252 Old myocardial infarction: Secondary | ICD-10-CM

## 2020-12-26 DIAGNOSIS — I480 Paroxysmal atrial fibrillation: Secondary | ICD-10-CM | POA: Diagnosis present

## 2020-12-26 DIAGNOSIS — N400 Enlarged prostate without lower urinary tract symptoms: Secondary | ICD-10-CM | POA: Diagnosis present

## 2020-12-26 DIAGNOSIS — I2510 Atherosclerotic heart disease of native coronary artery without angina pectoris: Secondary | ICD-10-CM | POA: Diagnosis present

## 2020-12-26 DIAGNOSIS — Z87891 Personal history of nicotine dependence: Secondary | ICD-10-CM | POA: Diagnosis not present

## 2020-12-26 DIAGNOSIS — Z9981 Dependence on supplemental oxygen: Secondary | ICD-10-CM

## 2020-12-26 DIAGNOSIS — Z888 Allergy status to other drugs, medicaments and biological substances status: Secondary | ICD-10-CM

## 2020-12-26 DIAGNOSIS — J449 Chronic obstructive pulmonary disease, unspecified: Secondary | ICD-10-CM | POA: Diagnosis present

## 2020-12-26 DIAGNOSIS — Q282 Arteriovenous malformation of cerebral vessels: Secondary | ICD-10-CM | POA: Diagnosis present

## 2020-12-26 DIAGNOSIS — Z800 Family history of malignant neoplasm of digestive organs: Secondary | ICD-10-CM

## 2020-12-26 DIAGNOSIS — I671 Cerebral aneurysm, nonruptured: Secondary | ICD-10-CM | POA: Diagnosis present

## 2020-12-26 DIAGNOSIS — I700 Atherosclerosis of aorta: Secondary | ICD-10-CM | POA: Diagnosis present

## 2020-12-26 DIAGNOSIS — Z83438 Family history of other disorder of lipoprotein metabolism and other lipidemia: Secondary | ICD-10-CM | POA: Diagnosis not present

## 2020-12-26 DIAGNOSIS — Z20822 Contact with and (suspected) exposure to covid-19: Secondary | ICD-10-CM | POA: Diagnosis present

## 2020-12-26 DIAGNOSIS — F0390 Unspecified dementia without behavioral disturbance: Secondary | ICD-10-CM | POA: Diagnosis present

## 2020-12-26 HISTORY — PX: CRANIOTOMY: SHX93

## 2020-12-26 HISTORY — PX: APPLICATION OF CRANIAL NAVIGATION: SHX6578

## 2020-12-26 LAB — ABO/RH: ABO/RH(D): B POS

## 2020-12-26 SURGERY — CRANIOTOMY INTRACRANIAL ARTERIO-VENOUS MALFORMATION DURAL COMPLEX (AVM)
Anesthesia: General | Site: Head

## 2020-12-26 MED ORDER — RIVASTIGMINE TARTRATE 1.5 MG PO CAPS
3.0000 mg | ORAL_CAPSULE | Freq: Two times a day (BID) | ORAL | Status: DC
  Administered 2020-12-26 – 2020-12-28 (×5): 3 mg via ORAL
  Filled 2020-12-26 (×6): qty 2

## 2020-12-26 MED ORDER — AMIODARONE HCL 200 MG PO TABS
200.0000 mg | ORAL_TABLET | Freq: Every morning | ORAL | Status: DC
  Administered 2020-12-27 – 2020-12-28 (×2): 200 mg via ORAL
  Filled 2020-12-26 (×3): qty 1

## 2020-12-26 MED ORDER — HYDROCODONE-ACETAMINOPHEN 5-325 MG PO TABS
1.0000 | ORAL_TABLET | ORAL | Status: DC | PRN
  Administered 2020-12-26 – 2020-12-28 (×7): 1 via ORAL
  Filled 2020-12-26 (×7): qty 1

## 2020-12-26 MED ORDER — THROMBIN 20000 UNITS EX KIT
PACK | CUTANEOUS | Status: AC
  Filled 2020-12-26: qty 1

## 2020-12-26 MED ORDER — THROMBIN 20000 UNITS EX SOLR
CUTANEOUS | Status: DC | PRN
  Administered 2020-12-26 (×2): 20000 [IU] via TOPICAL

## 2020-12-26 MED ORDER — DEXAMETHASONE SODIUM PHOSPHATE 10 MG/ML IJ SOLN
INTRAMUSCULAR | Status: DC | PRN
Start: 2020-12-26 — End: 2020-12-26
  Administered 2020-12-26: 10 mg via INTRAVENOUS

## 2020-12-26 MED ORDER — FENTANYL CITRATE (PF) 100 MCG/2ML IJ SOLN
25.0000 ug | INTRAMUSCULAR | Status: DC | PRN
  Administered 2020-12-26: 11:00:00 50 ug via INTRAVENOUS

## 2020-12-26 MED ORDER — FENTANYL CITRATE (PF) 250 MCG/5ML IJ SOLN
INTRAMUSCULAR | Status: AC
  Filled 2020-12-26: qty 5

## 2020-12-26 MED ORDER — LIDOCAINE-EPINEPHRINE 0.5 %-1:200000 IJ SOLN
INTRAMUSCULAR | Status: DC | PRN
  Administered 2020-12-26: 8 mL

## 2020-12-26 MED ORDER — LIDOCAINE-EPINEPHRINE 1 %-1:100000 IJ SOLN
INTRAMUSCULAR | Status: AC
  Filled 2020-12-26: qty 1

## 2020-12-26 MED ORDER — DEXAMETHASONE 4 MG PO TABS
6.0000 mg | ORAL_TABLET | Freq: Four times a day (QID) | ORAL | Status: AC
  Administered 2020-12-26 – 2020-12-27 (×4): 6 mg via ORAL
  Filled 2020-12-26 (×4): qty 2
  Filled 2020-12-26: qty 1.5

## 2020-12-26 MED ORDER — SODIUM CHLORIDE 0.9 % IV SOLN: INTRAVENOUS | Status: DC | PRN

## 2020-12-26 MED ORDER — NALOXONE HCL 0.4 MG/ML IJ SOLN: 0.0800 mg | INTRAMUSCULAR | Status: DC | PRN

## 2020-12-26 MED ORDER — ALBUTEROL SULFATE (2.5 MG/3ML) 0.083% IN NEBU: 3.0000 mL | INHALATION_SOLUTION | RESPIRATORY_TRACT | Status: DC | PRN

## 2020-12-26 MED ORDER — FLUOXETINE HCL 20 MG PO CAPS
20.0000 mg | ORAL_CAPSULE | Freq: Every day | ORAL | Status: DC
  Administered 2020-12-26 – 2020-12-28 (×3): 20 mg via ORAL
  Filled 2020-12-26 (×3): qty 1

## 2020-12-26 MED ORDER — DEXAMETHASONE 4 MG PO TABS
4.0000 mg | ORAL_TABLET | Freq: Four times a day (QID) | ORAL | Status: AC
  Administered 2020-12-27 – 2020-12-28 (×4): 4 mg via ORAL
  Filled 2020-12-26 (×3): qty 1

## 2020-12-26 MED ORDER — ONDANSETRON HCL 4 MG/2ML IJ SOLN
INTRAMUSCULAR | Status: AC
  Filled 2020-12-26: qty 2

## 2020-12-26 MED ORDER — EPHEDRINE 5 MG/ML INJ
INTRAVENOUS | Status: AC
  Filled 2020-12-26: qty 5

## 2020-12-26 MED ORDER — SENNOSIDES-DOCUSATE SODIUM 8.6-50 MG PO TABS: 1.0000 | ORAL_TABLET | Freq: Every evening | ORAL | Status: DC | PRN

## 2020-12-26 MED ORDER — PROPOFOL 10 MG/ML IV BOLUS
INTRAVENOUS | Status: DC | PRN
  Administered 2020-12-26: 20 mg via INTRAVENOUS
  Administered 2020-12-26: 200 mg via INTRAVENOUS

## 2020-12-26 MED ORDER — LIDOCAINE 2% (20 MG/ML) 5 ML SYRINGE
INTRAMUSCULAR | Status: DC | PRN
  Administered 2020-12-26: 60 mg via INTRAVENOUS

## 2020-12-26 MED ORDER — POTASSIUM CHLORIDE IN NACL 20-0.9 MEQ/L-% IV SOLN
INTRAVENOUS | Status: DC
  Filled 2020-12-26 (×5): qty 1000

## 2020-12-26 MED ORDER — PROPOFOL 10 MG/ML IV BOLUS
INTRAVENOUS | Status: AC
  Filled 2020-12-26: qty 20

## 2020-12-26 MED ORDER — FENTANYL CITRATE (PF) 100 MCG/2ML IJ SOLN
INTRAMUSCULAR | Status: AC
  Filled 2020-12-26: qty 2

## 2020-12-26 MED ORDER — AMISULPRIDE (ANTIEMETIC) 5 MG/2ML IV SOLN: 10.0000 mg | Freq: Once | INTRAVENOUS | Status: DC | PRN

## 2020-12-26 MED ORDER — PHENYLEPHRINE HCL-NACL 20-0.9 MG/250ML-% IV SOLN
INTRAVENOUS | Status: DC | PRN
Start: 2020-12-26 — End: 2020-12-26
  Administered 2020-12-26: 60 ug/min via INTRAVENOUS

## 2020-12-26 MED ORDER — ROCURONIUM BROMIDE 10 MG/ML (PF) SYRINGE
PREFILLED_SYRINGE | INTRAVENOUS | Status: AC
  Filled 2020-12-26: qty 10

## 2020-12-26 MED ORDER — BISACODYL 5 MG PO TBEC: 5.0000 mg | DELAYED_RELEASE_TABLET | Freq: Every day | ORAL | Status: DC | PRN

## 2020-12-26 MED ORDER — MORPHINE SULFATE (PF) 2 MG/ML IV SOLN
1.0000 mg | INTRAVENOUS | Status: DC | PRN
  Administered 2020-12-26 – 2020-12-27 (×8): 2 mg via INTRAVENOUS
  Filled 2020-12-26 (×8): qty 1

## 2020-12-26 MED ORDER — LIDOCAINE 2% (20 MG/ML) 5 ML SYRINGE
INTRAMUSCULAR | Status: AC
  Filled 2020-12-26: qty 5

## 2020-12-26 MED ORDER — 0.9 % SODIUM CHLORIDE (POUR BTL) OPTIME
TOPICAL | Status: DC | PRN
  Administered 2020-12-26: 10:00:00 1000 mL
  Administered 2020-12-26: 10:00:00 2000 mL

## 2020-12-26 MED ORDER — CHLORHEXIDINE GLUCONATE 0.12 % MT SOLN
15.0000 mL | Freq: Once | OROMUCOSAL | Status: AC
  Administered 2020-12-26: 07:00:00 15 mL via OROMUCOSAL
  Filled 2020-12-26: qty 15

## 2020-12-26 MED ORDER — LEVETIRACETAM IN NACL 500 MG/100ML IV SOLN
500.0000 mg | Freq: Two times a day (BID) | INTRAVENOUS | Status: DC
  Administered 2020-12-26 – 2020-12-27 (×2): 500 mg via INTRAVENOUS
  Filled 2020-12-26 (×2): qty 100

## 2020-12-26 MED ORDER — PANTOPRAZOLE SODIUM 40 MG IV SOLR
40.0000 mg | Freq: Every day | INTRAVENOUS | Status: DC
  Administered 2020-12-26: 22:00:00 40 mg via INTRAVENOUS
  Filled 2020-12-26: qty 40

## 2020-12-26 MED ORDER — CEFAZOLIN SODIUM-DEXTROSE 2-3 GM-%(50ML) IV SOLR
INTRAVENOUS | Status: DC | PRN
  Administered 2020-12-26: 2 g via INTRAVENOUS

## 2020-12-26 MED ORDER — ONDANSETRON HCL 4 MG PO TABS: 4.0000 mg | ORAL_TABLET | ORAL | Status: DC | PRN

## 2020-12-26 MED ORDER — LEVETIRACETAM IN NACL 1000 MG/100ML IV SOLN
1000.0000 mg | INTRAVENOUS | Status: AC
  Administered 2020-12-26: 09:00:00 1000 mg via INTRAVENOUS
  Filled 2020-12-26: qty 100

## 2020-12-26 MED ORDER — FUROSEMIDE 20 MG PO TABS: 20.0000 mg | ORAL_TABLET | ORAL | Status: DC | PRN

## 2020-12-26 MED ORDER — MANNITOL 25 % IV SOLN
INTRAVENOUS | Status: DC | PRN
  Administered 2020-12-26: 25 g via INTRAVENOUS

## 2020-12-26 MED ORDER — THROMBIN 5000 UNITS EX SOLR
CUTANEOUS | Status: AC
  Filled 2020-12-26: qty 5000

## 2020-12-26 MED ORDER — METOPROLOL SUCCINATE ER 25 MG PO TB24
25.0000 mg | ORAL_TABLET | Freq: Every day | ORAL | Status: DC
  Administered 2020-12-27: 09:00:00 25 mg via ORAL
  Filled 2020-12-26 (×2): qty 1

## 2020-12-26 MED ORDER — ADULT MULTIVITAMIN W/MINERALS CH
1.0000 | ORAL_TABLET | Freq: Every day | ORAL | Status: DC
  Administered 2020-12-27 – 2020-12-28 (×2): 1 via ORAL
  Filled 2020-12-26 (×2): qty 1

## 2020-12-26 MED ORDER — FENTANYL CITRATE (PF) 250 MCG/5ML IJ SOLN
INTRAMUSCULAR | Status: DC | PRN
  Administered 2020-12-26: 150 ug via INTRAVENOUS
  Administered 2020-12-26: 50 ug via INTRAVENOUS

## 2020-12-26 MED ORDER — MONTELUKAST SODIUM 10 MG PO TABS
10.0000 mg | ORAL_TABLET | Freq: Every day | ORAL | Status: DC
  Administered 2020-12-27 – 2020-12-28 (×2): 10 mg via ORAL
  Filled 2020-12-26 (×2): qty 1

## 2020-12-26 MED ORDER — ONDANSETRON HCL 4 MG/2ML IJ SOLN
INTRAMUSCULAR | Status: DC | PRN
  Administered 2020-12-26: 4 mg via INTRAVENOUS

## 2020-12-26 MED ORDER — EPHEDRINE SULFATE-NACL 50-0.9 MG/10ML-% IV SOSY
PREFILLED_SYRINGE | INTRAVENOUS | Status: DC | PRN
  Administered 2020-12-26 (×5): 5 mg via INTRAVENOUS

## 2020-12-26 MED ORDER — APIXABAN 5 MG PO TABS
5.0000 mg | ORAL_TABLET | Freq: Two times a day (BID) | ORAL | Status: DC
  Administered 2020-12-28: 10:00:00 5 mg via ORAL
  Filled 2020-12-26 (×2): qty 1

## 2020-12-26 MED ORDER — ORAL CARE MOUTH RINSE: 15.0000 mL | Freq: Once | OROMUCOSAL | Status: AC

## 2020-12-26 MED ORDER — TIOTROPIUM BROMIDE MONOHYDRATE 18 MCG IN CAPS: 18.0000 ug | ORAL_CAPSULE | Freq: Every day | RESPIRATORY_TRACT | Status: DC

## 2020-12-26 MED ORDER — TERAZOSIN HCL 5 MG PO CAPS
5.0000 mg | ORAL_CAPSULE | Freq: Every morning | ORAL | Status: DC
  Administered 2020-12-27 – 2020-12-28 (×2): 5 mg via ORAL
  Filled 2020-12-26 (×3): qty 1

## 2020-12-26 MED ORDER — CEFAZOLIN SODIUM-DEXTROSE 2-4 GM/100ML-% IV SOLN
INTRAVENOUS | Status: AC
  Filled 2020-12-26: qty 100

## 2020-12-26 MED ORDER — THIAMINE HCL 100 MG PO TABS
100.0000 mg | ORAL_TABLET | Freq: Every day | ORAL | Status: DC
  Administered 2020-12-27 – 2020-12-28 (×2): 100 mg via ORAL
  Filled 2020-12-26 (×2): qty 1

## 2020-12-26 MED ORDER — VITAMIN B-12 1000 MCG PO TABS
1000.0000 ug | ORAL_TABLET | Freq: Every day | ORAL | Status: DC
  Administered 2020-12-27 – 2020-12-28 (×2): 1000 ug via ORAL
  Filled 2020-12-26 (×2): qty 1

## 2020-12-26 MED ORDER — FOLIC ACID 1 MG PO TABS
1.0000 mg | ORAL_TABLET | Freq: Every day | ORAL | Status: DC
  Administered 2020-12-27 – 2020-12-28 (×2): 1 mg via ORAL
  Filled 2020-12-26 (×2): qty 1

## 2020-12-26 MED ORDER — BACITRACIN ZINC 500 UNIT/GM EX OINT
TOPICAL_OINTMENT | CUTANEOUS | Status: AC
  Filled 2020-12-26: qty 28.35

## 2020-12-26 MED ORDER — ONDANSETRON HCL 4 MG/2ML IJ SOLN: 4.0000 mg | INTRAMUSCULAR | Status: DC | PRN

## 2020-12-26 MED ORDER — SUGAMMADEX SODIUM 200 MG/2ML IV SOLN
INTRAVENOUS | Status: DC | PRN
  Administered 2020-12-26: 100 mg via INTRAVENOUS
  Administered 2020-12-26: 200 mg via INTRAVENOUS

## 2020-12-26 MED ORDER — LACTATED RINGERS IV SOLN: INTRAVENOUS | Status: DC

## 2020-12-26 MED ORDER — DEXAMETHASONE 4 MG PO TABS
4.0000 mg | ORAL_TABLET | Freq: Three times a day (TID) | ORAL | Status: DC
  Administered 2020-12-28 (×2): 4 mg via ORAL
  Filled 2020-12-26 (×2): qty 1

## 2020-12-26 MED ORDER — BACITRACIN ZINC 500 UNIT/GM EX OINT
TOPICAL_OINTMENT | CUTANEOUS | Status: DC | PRN
  Administered 2020-12-26: 1 via TOPICAL

## 2020-12-26 MED ORDER — PRAVASTATIN SODIUM 10 MG PO TABS
20.0000 mg | ORAL_TABLET | Freq: Every day | ORAL | Status: DC
  Administered 2020-12-27 – 2020-12-28 (×2): 20 mg via ORAL
  Filled 2020-12-26 (×2): qty 2

## 2020-12-26 MED ORDER — UMECLIDINIUM BROMIDE 62.5 MCG/ACT IN AEPB
1.0000 | INHALATION_SPRAY | Freq: Every day | RESPIRATORY_TRACT | Status: DC
  Administered 2020-12-28: 10:00:00 1 via RESPIRATORY_TRACT
  Filled 2020-12-26: qty 7

## 2020-12-26 MED ORDER — DEXAMETHASONE SODIUM PHOSPHATE 10 MG/ML IJ SOLN
INTRAMUSCULAR | Status: AC
  Filled 2020-12-26: qty 1

## 2020-12-26 MED ORDER — SENNA 8.6 MG PO TABS
1.0000 | ORAL_TABLET | Freq: Two times a day (BID) | ORAL | Status: DC
  Administered 2020-12-26 – 2020-12-28 (×4): 8.6 mg via ORAL
  Filled 2020-12-26 (×4): qty 1

## 2020-12-26 MED ORDER — AMLODIPINE BESYLATE 5 MG PO TABS
5.0000 mg | ORAL_TABLET | Freq: Every day | ORAL | Status: DC
  Administered 2020-12-27 – 2020-12-28 (×2): 5 mg via ORAL
  Filled 2020-12-26 (×2): qty 1

## 2020-12-26 MED ORDER — THROMBIN 20000 UNITS EX SOLR
CUTANEOUS | Status: AC
  Filled 2020-12-26: qty 20000

## 2020-12-26 MED ORDER — ACETAMINOPHEN 10 MG/ML IV SOLN: 1000.0000 mg | Freq: Once | INTRAVENOUS | Status: DC | PRN

## 2020-12-26 MED ORDER — LABETALOL HCL 5 MG/ML IV SOLN
10.0000 mg | INTRAVENOUS | Status: DC | PRN
  Administered 2020-12-26: 18:00:00 20 mg via INTRAVENOUS
  Administered 2020-12-26: 20:00:00 10 mg via INTRAVENOUS
  Administered 2020-12-27 (×4): 40 mg via INTRAVENOUS
  Administered 2020-12-27: 20 mg via INTRAVENOUS
  Administered 2020-12-27: 04:00:00 40 mg via INTRAVENOUS
  Filled 2020-12-26: qty 4
  Filled 2020-12-26 (×2): qty 8
  Filled 2020-12-26: qty 4
  Filled 2020-12-26 (×2): qty 8
  Filled 2020-12-26: qty 4
  Filled 2020-12-26: qty 8

## 2020-12-26 MED ORDER — ROCURONIUM BROMIDE 10 MG/ML (PF) SYRINGE
PREFILLED_SYRINGE | INTRAVENOUS | Status: DC | PRN
  Administered 2020-12-26: 60 mg via INTRAVENOUS
  Administered 2020-12-26: 20 mg via INTRAVENOUS
  Administered 2020-12-26: 10 mg via INTRAVENOUS
  Administered 2020-12-26: 20 mg via INTRAVENOUS

## 2020-12-26 SURGICAL SUPPLY — 93 items
BAG COUNTER SPONGE SURGICOUNT (BAG) ×3 IMPLANT
BAG SURGICOUNT SPONGE COUNTING (BAG) ×1
BAND RUBBER #18 3X1/16 STRL (MISCELLANEOUS) ×8 IMPLANT
BENZOIN TINCTURE PRP APPL 2/3 (GAUZE/BANDAGES/DRESSINGS) IMPLANT
BIT DRILL WIRE PASS 1.3MM (BIT) IMPLANT
BLADE EYE SICKLE 84 5 BEAV (BLADE) ×1 IMPLANT
BLADE EYE SICKLE 84 5MM BEAV (BLADE) ×1
BLADE SAW GIGLI 16 STRL (MISCELLANEOUS) IMPLANT
BLADE ULTRA TIP 2M (BLADE) IMPLANT
BNDG GAUZE ELAST 4 BULKY (GAUZE/BANDAGES/DRESSINGS) IMPLANT
BUR ACORN 6.0 PRECISION (BURR) ×3 IMPLANT
BUR ACORN 6.0MM PRECISION (BURR) ×1
BUR MATCHSTICK NEURO 3.0 LAGG (BURR) IMPLANT
BUR SPIRAL ROUTER 2.3 (BUR) IMPLANT
BUR SPIRAL ROUTER 2.3MM (BUR)
CABLE BIPOLOR RESECTION CORD (MISCELLANEOUS) ×4 IMPLANT
CANISTER SUCT 3000ML PPV (MISCELLANEOUS) ×4 IMPLANT
CARTRIDGE OIL MAESTRO DRILL (MISCELLANEOUS) ×2 IMPLANT
CLIP VESOCCLUDE MED 6/CT (CLIP) IMPLANT
COVER BURR HOLE 7 (Orthopedic Implant) ×6 IMPLANT
DECANTER SPIKE VIAL GLASS SM (MISCELLANEOUS) ×2 IMPLANT
DIFFUSER DRILL AIR PNEUMATIC (MISCELLANEOUS) ×4 IMPLANT
DRAPE MICROSCOPE LEICA (MISCELLANEOUS) ×4 IMPLANT
DRAPE NEUROLOGICAL W/INCISE (DRAPES) ×4 IMPLANT
DRAPE WARM FLUID 44X44 (DRAPES) ×4 IMPLANT
DRILL WIRE PASS 1.3MM (BIT)
DRSG OPSITE POSTOP 4X6 (GAUZE/BANDAGES/DRESSINGS) ×2 IMPLANT
DURAPREP 6ML APPLICATOR 50/CS (WOUND CARE) ×4 IMPLANT
ELECT CAUTERY BLADE 6.4 (BLADE) ×4 IMPLANT
ELECT REM PT RETURN 9FT ADLT (ELECTROSURGICAL) ×4
ELECTRODE REM PT RTRN 9FT ADLT (ELECTROSURGICAL) ×2 IMPLANT
EVACUATOR SILICONE 100CC (DRAIN) IMPLANT
FORCEPS BIPOLAR SPETZLER 8 1.0 (NEUROSURGERY SUPPLIES) ×2 IMPLANT
GAUZE 4X4 16PLY ~~LOC~~+RFID DBL (SPONGE) IMPLANT
GAUZE SPONGE 4X4 12PLY STRL (GAUZE/BANDAGES/DRESSINGS) IMPLANT
GLOVE EXAM NITRILE XL STR (GLOVE) IMPLANT
GLOVE OPTIFIT SS 8.0 STRL (GLOVE) IMPLANT
GLOVE SURG ENC MOIS LTX SZ6.5 (GLOVE) IMPLANT
GLOVE SURG ENC MOIS LTX SZ7 (GLOVE) IMPLANT
GLOVE SURG ENC MOIS LTX SZ7.5 (GLOVE) IMPLANT
GLOVE SURG ENC MOIS LTX SZ8 (GLOVE) IMPLANT
GLOVE SURG ENC MOIS LTX SZ8.5 (GLOVE) IMPLANT
GLOVE SURG ENC TEXT LTX SZ8 (GLOVE) IMPLANT
GLOVE SURG LTX SZ6.5 (GLOVE) ×14 IMPLANT
GLOVE SURG LTX SZ7 (GLOVE) IMPLANT
GLOVE SURG LTX SZ7.5 (GLOVE) IMPLANT
GLOVE SURG LTX SZ8 (GLOVE) IMPLANT
GLOVE SURG LTX SZ8.5 (GLOVE) IMPLANT
GLOVE SURG POLYISO LF SZ6.5 (GLOVE) IMPLANT
GLOVE SURG UNDER LTX SZ6.5 (GLOVE) ×12 IMPLANT
GLOVE SURG UNDER LTX SZ7 (GLOVE) IMPLANT
GLOVE SURG UNDER LTX SZ7.5 (GLOVE) IMPLANT
GLOVE SURG UNDER LTX SZ8 (GLOVE) IMPLANT
GLOVE SURG UNDER LTX SZ8.5 (GLOVE) IMPLANT
GOWN STRL REUS W/ TWL LRG LVL3 (GOWN DISPOSABLE) ×4 IMPLANT
GOWN STRL REUS W/ TWL XL LVL3 (GOWN DISPOSABLE) IMPLANT
GOWN STRL REUS W/TWL 2XL LVL3 (GOWN DISPOSABLE) IMPLANT
GOWN STRL REUS W/TWL LRG LVL3 (GOWN DISPOSABLE) ×6
GOWN STRL REUS W/TWL XL LVL3 (GOWN DISPOSABLE)
GRAFT DURAGEN MATRIX 2WX2L ×2 IMPLANT
HEMOSTAT SURGICEL 2X14 (HEMOSTASIS) ×4 IMPLANT
HOOK DURA (MISCELLANEOUS) ×4 IMPLANT
KIT BASIN OR (CUSTOM PROCEDURE TRAY) ×4 IMPLANT
KIT DRAIN CSF ACCUDRAIN (MISCELLANEOUS) IMPLANT
KIT TURNOVER KIT B (KITS) ×4 IMPLANT
MARKER SPHERE PSV REFLC 13MM (MARKER) ×8 IMPLANT
NDL HYPO 25X1 1.5 SAFETY (NEEDLE) ×2 IMPLANT
NEEDLE HYPO 25X1 1.5 SAFETY (NEEDLE) ×4 IMPLANT
NS IRRIG 1000ML POUR BTL (IV SOLUTION) ×4 IMPLANT
OIL CARTRIDGE MAESTRO DRILL (MISCELLANEOUS) ×4
PACK CRANIOTOMY CUSTOM (CUSTOM PROCEDURE TRAY) ×4 IMPLANT
PAD ARMBOARD 7.5X6 YLW CONV (MISCELLANEOUS) ×14 IMPLANT
PATTIES SURGICAL .25X.25 (GAUZE/BANDAGES/DRESSINGS) IMPLANT
PATTIES SURGICAL .5 X.5 (GAUZE/BANDAGES/DRESSINGS) ×2 IMPLANT
PATTIES SURGICAL .5 X3 (DISPOSABLE) ×2 IMPLANT
PATTIES SURGICAL 1/4 X 3 (GAUZE/BANDAGES/DRESSINGS) IMPLANT
PATTIES SURGICAL 1X1 (DISPOSABLE) ×2 IMPLANT
PIN MAYFIELD SKULL DISP (PIN) ×2 IMPLANT
SCREW UNIII AXS SD 1.5X4 (Screw) ×20 IMPLANT
SPONGE NEURO XRAY DETECT 1X3 (DISPOSABLE) IMPLANT
SPONGE SURGIFOAM ABS GEL 100 (HEMOSTASIS) ×4 IMPLANT
SPONGE SURGIFOAM ABS GEL 100C (HEMOSTASIS) ×4 IMPLANT
STAPLER SKIN PROX WIDE 3.9 (STAPLE) ×4 IMPLANT
SUT ETHILON 3 0 FSL (SUTURE) IMPLANT
SUT NURALON 4 0 TR CR/8 (SUTURE) ×8 IMPLANT
SUT VIC AB 2-0 CT2 18 VCP726D (SUTURE) ×8 IMPLANT
SUT VIC AB 3-0 SH 8-18 (SUTURE) IMPLANT
SYR CONTROL 10ML LL (SYRINGE) ×4 IMPLANT
TOWEL GREEN STERILE (TOWEL DISPOSABLE) ×4 IMPLANT
TOWEL GREEN STERILE FF (TOWEL DISPOSABLE) ×4 IMPLANT
TRAY FOLEY MTR SLVR 16FR STAT (SET/KITS/TRAYS/PACK) IMPLANT
UNDERPAD 30X36 HEAVY ABSORB (UNDERPADS AND DIAPERS) IMPLANT
WATER STERILE IRR 1000ML POUR (IV SOLUTION) ×4 IMPLANT

## 2020-12-26 NOTE — Progress Notes (Signed)
°  Transition of Care [facility]) Screening Note   Patient Details  Name: Samuel Barnes Date of Birth: [birth year]   Transition of Care [facility]) CM/SW Contact:    Mearl Latin, LCSW Phone Number: 12/26/2020, 5:14 PM    Transition of Care Department [facility]) has reviewed patient and no TOC needs have been identified at this time. We will continue to monitor patient advancement through interdisciplinary progression rounds. If new patient transition needs arise, please place a TOC consult.

## 2020-12-26 NOTE — Op Note (Signed)
12/26/2020  11:05 AM  PATIENT:  Samuel Barnes  70 y.o. male Previously hemorrhaged due to a dural av fistula. Admitted for fistula closure. PRE-OPERATIVE DIAGNOSIS:  Arteriovenous malformation, dural av fistula  POST-OPERATIVE DIAGNOSIS:  Arteriovenous malformation, dural av fistula, left occipital lobe  PROCEDURE:  Procedure(s): Left Subocciptal Cranitomy APPLICATION OF CRANIAL NAVIGATION  SURGEON: Surgeon(s): [name], MD Lisbeth Renshaw, MD  ASSISTANTS:Nundkumar  ANESTHESIA:   general  EBL:  Total I/O In: 1150 [I.V.:1000; IV Piggyback:150] Out: 900 [Urine:850; Blood:50]  BLOOD ADMINISTERED:none   COUNT:per nursing  DRAINS: none   SPECIMEN:  No Specimen  DICTATION: Samuel Barnes was taken to the operating room, intubated, and placed under a general anesthetic without difficulty. He was positioned with his left side up and all of his pressure points padded. Once adequate anesthesia was obtained I placed his head in a [place] head holder. We then positioned his head and attached the head holder to the bed with the [facility] adapter. The head was turned slightly towards the right, angling towards the floor. I attached the [facility] frame to the [place].  I then localized the Head with the preoperative MRI scan to the [facility] navigation system. I verified the localization and proceeded with planning the approach and opening.  His head was then prepped and draped in a sterile manner.  I infiltrated the planned incision with lidocaine, then opened coronally to accommodate the planned craniotomy. We placed a retractor to expose the skull. Checked once more with the navigation system. Burr holes were then created, and connected with the drill. The dura was exposed and opened. We then retracted the occipital lobe superiorly to expose the tentorium. Abnormal vasculature was identified, coagulated, and sharply divided. There were a few more abnormal vessels along  the tentorium coagulated, and divided. We then worked along the falx, identified two larger vessels, coagulated and divided them. With the occipital lobe now free to mobilize we identified the incisura, and were confident that the dural fistula was now disconnected from its blood supply. We ensured hemostasis, then closed.  The dura was approximated, duragen placed on the dura. The skull flap was attached to the skull with plates and screws. The scalp was approximated with sutures, and the scalp edges with staples. A sterile dressing was applied. I removed his head from the adapter, then removed the head holder. He was extubated and taken to the PACU.  PLAN OF CARE: Admit to inpatient   PATIENT DISPOSITION:  PACU - hemodynamically stable.   Delay start of Pharmacological VTE agent (>24hrs) due to surgical blood loss or risk of bleeding:  yes

## 2020-12-26 NOTE — Anesthesia Procedure Notes (Signed)
Procedure Name: Intubation Date/Time: 12/26/2020 8:10 AM Performed by: Reece Agar, CRNA Pre-anesthesia Checklist: Patient identified, Emergency Drugs available, Suction available and Patient being monitored Patient Re-evaluated:Patient Re-evaluated prior to induction Oxygen Delivery Method: Circle System Utilized Preoxygenation: Pre-oxygenation with 100% oxygen Induction Type: IV induction Ventilation: Mask ventilation without difficulty and Oral airway inserted - appropriate to patient size Laryngoscope Size: Mac and 4 Grade View: Grade I Tube type: Oral Tube size: 7.5 mm Number of attempts: 1 Airway Equipment and Method: Stylet and Oral airway Placement Confirmation: ETT inserted through vocal cords under direct vision, positive ETCO2 and breath sounds checked- equal and bilateral Secured at: 23 cm Tube secured with: Tape Dental Injury: Teeth and Oropharynx as per pre-operative assessment

## 2020-12-26 NOTE — Transfer of Care (Signed)
Immediate Anesthesia Transfer of Care Note  Patient: Samuel Barnes [facility]  Procedure(s) Performed: Left Subocciptal Cranitomy (Left: Head) APPLICATION OF CRANIAL NAVIGATION  Patient Location: PACU  Anesthesia Type:General  Level of Consciousness: awake and alert   Airway & Oxygen Therapy: Patient Spontanous Breathing  Post-op Assessment: Report given to RN, Post -op Vital signs reviewed and stable, Patient moving all extremities X 4 and Patient able to stick tongue midline  Post vital signs: Reviewed and stable  Last Vitals:  Vitals Value Taken Time  BP 124/65 12/26/20 1105  Temp    Pulse 55 12/26/20 1107  Resp 15 12/26/20 1107  SpO2 99 % 12/26/20 1107  Vitals shown include unvalidated device data.  Last Pain:  Vitals:   12/26/20 0617  TempSrc:   PainSc: 0-No pain      Patients Stated Pain Goal: 1 (12/26/20 0617)  Complications: No notable events documented.

## 2020-12-26 NOTE — Anesthesia Postprocedure Evaluation (Signed)
Anesthesia Post Note  Patient: Samuel Barnes  Procedure(s) Performed: Left Subocciptal Cranitomy (Left: Head) APPLICATION OF CRANIAL NAVIGATION     Patient location during evaluation: PACU Anesthesia Type: General Level of consciousness: awake Pain management: pain level controlled Vital Signs Assessment: post-procedure vital signs reviewed and stable Respiratory status: spontaneous breathing, nonlabored ventilation, respiratory function stable and patient connected to nasal cannula oxygen Cardiovascular status: blood pressure returned to baseline and stable Postop Assessment: no apparent nausea or vomiting Anesthetic complications: no   No notable events documented.  Last Vitals:  Vitals:   12/26/20 1800 12/26/20 1802  BP: 132/72   Pulse: 61 (!) 57  Resp: 14 16  Temp:    SpO2: 95% 95%    Last Pain:  Vitals:   12/26/20 1823  TempSrc:   PainSc: 3                  Henreitta Spittler P Cosette Prindle

## 2020-12-26 NOTE — H&P (Signed)
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  We are going to go in to isolate this dural AV fistula and hopefully improve the risk of him having another hemorrhage in the future.  He is alert, oriented x4.  He answers all questions appropriately.  Memory, language, attention span, and fund of knowledge are normal.  Speech is clear and fluent.  He uses a cane with his gait, and he is certainly better now.  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  I will see him next week for the surgery, which will be done with Dr. Gale Journey. Allergies  Allergen Reactions   Ciprofloxacin Shortness Of Breath and Other (See Comments)    Numbness in extremities    Gabapentin Other (See Comments)    Mental problems, depression, anger   Chlorhexidine Gluconate Other (See Comments)    Burning and itching   Past Medical History:  Diagnosis Date   Anxiety    Aortic atherosclerosis (HCC) 07/28/2020   Atrial fibrillation (HCC)    BPH (benign prostatic hyperplasia)    CHF (congestive heart failure) (HCC)    Class 2 obesity 07/28/2020   COPD (chronic obstructive pulmonary disease) (HCC)    Coronary artery calcification 07/28/2020   Dementia (HCC)    Depression    Family history of adverse reaction to anesthesia    Hyperlipidemia 07/28/2020   Hypertension    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   Tobacco use 07/28/2020   Past Surgical History:  Procedure Laterality Date   CARDIOVERSION N/A 08/02/2020   Procedure: CARDIOVERSION;   Surgeon: 08/04/2020, MD;  Location: [facility] ENDOSCOPY;  Service: Cardiovascular;  Laterality: N/A;   CARDIOVERSION N/A 10/31/2020   Procedure: CARDIOVERSION;  Surgeon: 11/02/2020, DO;  Location: [facility] ENDOSCOPY;  Service: Cardiovascular;  Laterality: N/A;   CATARACT EXTRACTION Bilateral 11/2020   IR ANGIO INTRA EXTRACRAN SEL COM CAROTID INNOMINATE BILAT MOD SED  10/04/2020   IR ANGIO VERTEBRAL SEL SUBCLAVIAN INNOMINATE BILAT MOD SED  10/03/2020   IR THORACENTESIS ASP PLEURAL SPACE W/IMG GUIDE  07/31/2020   IR 08/02/2020 GUIDE VASC ACCESS RIGHT  10/04/2020   TEE WITHOUT CARDIOVERSION N/A 08/02/2020   Procedure: TRANSESOPHAGEAL ECHOCARDIOGRAM (TEE);  Surgeon: 08/04/2020, MD;  Location: [facility] ENDOSCOPY;  Service: Cardiovascular;  Laterality: N/A;   TRANSURETHRAL RESECTION OF PROSTATE     Family History  Problem Relation Age of Onset   Coronary artery disease Mother    Hypertension Mother    Hyperlipidemia Mother    Ovarian cancer Mother    Ovarian cancer Sister    Liver cancer Sister    Liver cancer Brother    Social History   Socioeconomic History   Marital status: Divorced    Spouse name: Not on file   Number of children: Not on file   Years of education: Not on file  Highest education level: Not on file  Occupational History   Not on file  Tobacco Use   Smoking status: Former    Types: Cigarettes    Start date: 36    Quit date: 08/14/2020    Years since quitting: 0.3   Smokeless tobacco: Never  Vaping Use   Vaping Use: Never used  Substance and Sexual Activity   Alcohol use: Not Currently    Alcohol/week: 1.0 standard drink    Types: 1 Shots of liquor per week   Drug use: Never   Sexual activity: Not on file  Other Topics Concern   Not on file  Social History Narrative   Not on file   Social Determinants of Health   Financial Resource Strain: Not on file  Food Insecurity: Not on file  Transportation Needs: Not on file  Physical Activity: Not on file   Stress: Not on file  Social Connections: Not on file  Intimate Partner Violence: Not on file   Physical Exam Constitutional:      Appearance: Normal appearance.  HENT:     Head: Normocephalic and atraumatic.     Right Ear: External ear normal.     Left Ear: External ear normal.     Nose: Nose normal.     Mouth/Throat:     Mouth: Mucous membranes are moist.     Pharynx: Oropharynx is clear.  Eyes:     Extraocular Movements: Extraocular movements intact.     Conjunctiva/sclera: Conjunctivae normal.     Pupils: Pupils are equal, round, and reactive to light.  Cardiovascular:     Rate and Rhythm: Normal rate and regular rhythm.  Pulmonary:     Effort: Pulmonary effort is normal.     Breath sounds: Normal breath sounds.  Abdominal:     General: Abdomen is flat.     Palpations: Abdomen is soft.  Musculoskeletal:        General: Normal range of motion.     Cervical back: Normal range of motion.  Skin:    General: Skin is warm and dry.  Neurological:     Mental Status: He is alert and oriented to person, place, and time. Mental status is at baseline.     Sensory: Sensation is intact.     Motor: Motor function is intact.     Coordination: Coordination is intact.     Deep Tendon Reflexes: Reflexes are normal and symmetric. Babinski sign absent on the right side. Babinski sign absent on the left side.     Reflex Scores:      Tricep reflexes are 2+ on the right side and 2+ on the left side.      Bicep reflexes are 2+ on the right side and 2+ on the left side.      Brachioradialis reflexes are 2+ on the right side and 2+ on the left side.      Patellar reflexes are 2+ on the right side and 2+ on the left side.      Achilles reflexes are 2+ on the right side and 2+ on the left side.    Comments: Small right field deficit  OR for craniotomy and avm resection

## 2020-12-26 NOTE — Anesthesia Procedure Notes (Signed)
Arterial Line Insertion Start/End12/20/2022 7:15 AM, 12/26/2020 7:25 AM Performed by: Tressia Miners, CRNA, CRNA  Patient location: Pre-op. Preanesthetic checklist: patient identified, IV checked, site marked, risks and benefits discussed, surgical consent, monitors and equipment checked, pre-op evaluation, timeout performed and anesthesia consent Lidocaine 1% used for infiltration Right, radial was placed Catheter size: 20 G Hand hygiene performed , maximum sterile barriers used  and Seldinger technique used Allen's test indicative of satisfactory collateral circulation Attempts: 2 Procedure performed without using ultrasound guided technique. Following insertion, dressing applied and Biopatch. Post procedure assessment: normal and unchanged  Patient tolerated the procedure well with no immediate complications.

## 2020-12-27 MED ORDER — OXYCODONE HCL ER 15 MG PO T12A
15.0000 mg | EXTENDED_RELEASE_TABLET | Freq: Two times a day (BID) | ORAL | Status: DC
  Administered 2020-12-27 – 2020-12-28 (×4): 15 mg via ORAL
  Filled 2020-12-27 (×4): qty 1

## 2020-12-27 MED ORDER — LEVETIRACETAM 500 MG PO TABS
500.0000 mg | ORAL_TABLET | Freq: Two times a day (BID) | ORAL | Status: DC
  Administered 2020-12-27 – 2020-12-28 (×4): 500 mg via ORAL
  Filled 2020-12-27 (×4): qty 1

## 2020-12-27 MED ORDER — PANTOPRAZOLE SODIUM 40 MG PO TBEC
40.0000 mg | DELAYED_RELEASE_TABLET | Freq: Every day | ORAL | Status: DC
  Administered 2020-12-27 – 2020-12-28 (×2): 40 mg via ORAL
  Filled 2020-12-27 (×2): qty 1

## 2020-12-27 MED FILL — Thrombin For Soln Kit 20000 Unit: CUTANEOUS | Qty: 1 | Status: AC

## 2020-12-27 MED FILL — Thrombin For Soln 20000 Unit: CUTANEOUS | Qty: 1 | Status: AC

## 2020-12-27 NOTE — Progress Notes (Addendum)
Spoke with patient about taking out his foley catheter this morning since it is no longer indicated. He stated that he cannot urinate while laying in the bed with either the urinal or condom cath, he has to stand up. He prefers to leave the urinary catheter in for now, at least until he speaks with Dr Franky Macho or is discharged. I told him we will follow up with Dr Franky Macho when he rounds.   1400 update: Dr Franky Macho ok with leaving foley in per patient request.  Sherral Hammers, RN

## 2020-12-27 NOTE — Progress Notes (Signed)
Patient ID: Samuel Barnes, male   DOB: 04-22-1950, 70 y.o.   MRN: [id number] BP 110/64    Pulse (!) 59    Temp 97.7 F (36.5 C) (Oral)    Resp (!) 21    Ht 5\' 9"  (1.753 m)    Wt 88 kg    SpO2 96%    BMI 28.65 kg/m  Alert and oriented x 4, complaining of a headache. Normal exam Moving all extremities well Wound is clean, and dry Possible discharge tomorrow

## 2020-12-28 ENCOUNTER — Encounter (HOSPITAL_COMMUNITY): Payer: Self-pay | Admitting: Neurosurgery

## 2020-12-28 MED ORDER — OXYCODONE HCL 5 MG PO TABS: 10.0000 mg | ORAL_TABLET | Freq: Four times a day (QID) | ORAL | 0 refills | Status: AC | PRN

## 2020-12-28 NOTE — Discharge Instructions (Signed)
Craniotomy °Care After °Please read the instructions outlined below and refer to this sheet in the next few weeks. These discharge instructions provide you with general information on caring for yourself after you leave the hospital. Your surgeon may also give you specific instructions. While your treatment has been planned according to the most current medical practices available, unavoidable complications occasionally occur. If you have any problems or questions after discharge, please call your surgeon. °Although there are many types of brain surgery, recovery following craniotomy (surgical opening of the skull) is much the same for each. However, recovery depends on many factors. These include the type and severity of brain injury and the type of surgery. It also depends on any nervous system function problems (neurological deficits) before surgery. If the craniotomy was done for cancer, chemotherapy and radiation could follow. You could be in the hospital from 5 days to a couple weeks. This depends on the type of surgery, findings, and whether there are complications. °HOME CARE INSTRUCTIONS  °· It is not unusual to hear a clicking noise after a craniotomy, the plates and screws used to attach the bone flap can sometimes cause this. It is a normal occurrence if this does happen °· Do not drive for 10 days after the operation °· Your scalp may feel spongy for a while, because of fluid under it. This will gradually get better. Occasionally, the surgeon will not replace the bone that was removed to access the brain. If there is a bony defect, the surgeon will ask you to wear a helmet for protection. This is a discussion you should have with your surgeon prior to leaving the hospital (discharge). °· Numbness may persist in some areas of your scalp. °· Take all medications as directed. Sometimes steroids to control swelling are prescribed. Anticonvulsants to prevent seizures may also be given. Do not use alcohol,  other drugs, or medications unless your surgeon says it is OK. °· Keep the wound dry and clean. The wound may be washed gently with soap and water. Then, you may gently blot or dab it dry, without rubbing. Do not take baths, use swimming pools or hot tubs for 10 days, or as instructed by your caregiver. It is best to wait to see you surgeon at your first postoperative visit, and to get directions at that time. °· Only take over-the-counter or prescription medicines for pain, discomfort, or fever as directed by your caregiver. °· You may continue your normal diet, as directed. °· Walking is OK for exercise. Wait at least 3 months before you return to mild, non-contact sports or as your surgeon suggests. Contact sports should be avoided for at least 1 year, unless your surgeon says it is OK. °· If you are prescribed steroids, take them exactly as prescribed. If you start having a decrease in nervous system functions (neurological deficits) and headaches as the dose of steroids is reduced, tell your surgeon right away. °· When the anticonvulsant prescription is finished you no longer need to take it. °SEEK IMMEDIATE MEDICAL CARE IF:  °· You develop nausea, vomiting, severe headaches, confusion, or you have a seizure. °· You develop chest pain, a stiff neck, or difficulty breathing. °· There is redness, swelling, or increasing pain in the wound or pin insertion sites. °· You have an increase in swelling or bruising around the eyes. °· There is drainage or pus coming from the wound. °· You have an oral temperature above 102° F (38.9° C), not controlled by medicine. °·   You notice a foul smell coming from the wound or dressing. °· The wound breaks open (edges not staying together) after the stitches have been removed. °· You develop dizziness or fainting while standing. °· You develop a rash. °· You develop any reaction or side effects to the medications given. °Document Released: 03/26/2005 Document Revised: 03/18/2011  Document Reviewed: 01/02/2009 °[employer]® Patient Information ©2013 [employer]. ° °

## 2020-12-28 NOTE — Progress Notes (Signed)
Iv's removed. Education regarding wound care to incision provided to patient and patient's daughter at bedside. AVS provided.

## 2020-12-28 NOTE — Evaluation (Addendum)
Occupational Therapy Evaluation Patient Details Name: Samuel Barnes MRN: [id number] DOB: 03-31-1950 Today's Date: 12/28/2020   History of Present Illness 70 y/o male admitted on 12/26/20 following L suboccipital craniotomy for arteriovenous malformation. 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 PMH: aortic atherosclerosis, HTN, COPD, A-fib on Eliquis, diastolic CHF, stroke   Clinical Impression   Patient admitted for the diagnosis and procedure above.  PTA he lives alone, his daughter lives nearby, and can provide PRN assist.  He uses a RW at baseline, but admits to multiple falls at home.  He is able to care for his ADL/IADL needs at home.  Deficits impacting independence are listed below.  Currently he is needing generalized supervision and occasional Min Guard for mobility and self cafe at a sit/stand level.  OT will follow in the acute setting, but no follow up OT is anticipated.       Recommendations for follow up therapy are one component of a multi-disciplinary discharge planning process, led by the attending physician.  Recommendations may be updated based on patient status, additional functional criteria and insurance authorization.   Follow Up Recommendations  No OT follow up    Assistance Recommended at Discharge Intermittent Supervision/Assistance  Functional Status Assessment  Patient has had a recent decline in their functional status and demonstrates the ability to make significant improvements in function in a reasonable and predictable amount of time.  Equipment Recommendations  None recommended by OT    Recommendations for Other Services       Precautions / Restrictions Precautions Precautions: Fall Restrictions Weight Bearing Restrictions: No      Mobility Bed Mobility Overal bed mobility: Needs Assistance Bed Mobility: Sidelying to Sit   Sidelying to sit: Supervision            Transfers Overall transfer  level: Needs assistance Equipment used: Rolling walker (2 wheels) Transfers: Sit to/from Stand Sit to Stand: Min guard           General transfer comment: cues to push from the solid surface      Balance Overall balance assessment: Needs assistance Sitting-balance support: Feet unsupported Sitting balance-Leahy Scale: Fair     Standing balance support: Reliant on assistive device for balance Standing balance-Leahy Scale: Fair Standing balance comment: history of falls at his home                           ADL either performed or assessed with clinical judgement   ADL       Grooming: Wash/dry hands;Supervision/safety;Standing       Lower Body Bathing: Min guard;Sit to/from stand       Lower Body Dressing: Min guard;Sit to/from stand   Toilet Transfer: Supervision/safety;Rolling walker (2 wheels)   Toileting- Clothing Manipulation and Hygiene: Supervision/safety;Sit to/from stand               Vision Patient Visual Report: No change from baseline       Perception Perception Perception: Within Functional Limits   Praxis Praxis Praxis: Intact    Pertinent Vitals/Pain Pain Assessment: Faces Faces Pain Scale: Hurts little more Pain Location: Headache Pain Descriptors / Indicators: Aching Pain Intervention(s): Monitored during session     Hand Dominance Right   Extremity/Trunk Assessment Upper Extremity Assessment Upper Extremity Assessment: Overall WFL for tasks assessed   Lower Extremity Assessment Lower Extremity Assessment: Defer to PT evaluation   Cervical / Trunk Assessment Cervical / Trunk  Assessment: Normal   Communication Communication Communication: No difficulties   Cognition Arousal/Alertness: Awake/alert Behavior During Therapy: WFL for tasks assessed/performed Overall Cognitive Status: Within Functional Limits for tasks assessed                                                        Home  Living Family/patient expects to be discharged to:: Private residence Living Arrangements: Alone Available Help at Discharge: Family;Available PRN/intermittently Type of Home: House Home Access: Stairs to enter [employer] of Steps: 1 step in the back; 6-7 in front but doesn't enter through front Entrance Stairs-Rails: None Home Layout: One level     Bathroom Shower/Tub: Occupational psychologist: Standard Bathroom Accessibility: Yes How Accessible: Accessible via walker Home Equipment: Rolling Walker (2 wheels);BSC/3in1;Shower seat;Cane - single point;Rollator (4 wheels)          Prior Functioning/Environment Prior Level of Function : History of Falls (last six months);Independent/Modified Independent             Mobility Comments: Uses rollator for long distances but no AD in the home due to furniture walker. Reports 4 falls in past 6 months ADLs Comments: sits to bathe        OT Problem List: Impaired balance (sitting and/or standing);Decreased safety awareness;Pain      OT Treatment/Interventions: Self-care/ADL training;Therapeutic activities;Balance training;Patient/family education    OT Goals(Current goals can be found in the care plan section) Acute Rehab OT Goals Patient Stated Goal: Go back home OT Goal Formulation: With patient Time For Goal Achievement: 01/11/21 Potential to Achieve Goals: Good ADL Goals Pt Will Perform Grooming: with modified independence;standing Pt Will Perform Lower Body Bathing: with set-up;sit to/from stand Pt Will Perform Lower Body Dressing: with set-up;sit to/from stand Pt Will Transfer to Toilet: with modified independence;regular height toilet;ambulating Pt Will Perform Toileting - Clothing Manipulation and hygiene: with modified independence;sit to/from stand  OT Frequency: Min 2X/week   Barriers to D/C: Decreased caregiver support  Daughter lives nearby, and can assist on a PRN basis        Co-evaluation PT/OT/SLP Co-Evaluation/Treatment: Yes Reason for Co-Treatment: Complexity of the patient's impairments (multi-system involvement);Necessary to address cognition/behavior during functional activity;For patient/therapist safety;To address functional/ADL transfers   OT goals addressed during session: ADL's and self-care      AM-PAC OT "6 Clicks" Daily Activity     Outcome Measure Help from another person eating meals?: None Help from another person taking care of personal grooming?: None Help from another person toileting, which includes using toliet, bedpan, or urinal?: A Little Help from another person bathing (including washing, rinsing, drying)?: A Little Help from another person to put on and taking off regular upper body clothing?: None Help from another person to put on and taking off regular lower body clothing?: A Little 6 Click Score: 21   End of Session Equipment Utilized During Treatment: Gait belt;Rolling walker (2 wheels) Nurse Communication: Mobility status  Activity Tolerance: Patient tolerated treatment well Patient left: in chair;with call bell/phone within reach;with chair alarm set  OT Visit Diagnosis: Unsteadiness on feet (R26.81);Pain                Time: 1015-1105 OT Time Calculation (min): 50 min Charges:  OT General Charges $OT Visit: 1 Visit OT Evaluation $OT Eval Moderate Complexity: 1 Mod  12/28/2020  RP, OTR/L  Acute Rehabilitation Services  Office:  [phone]   Metta Clines 12/28/2020, 12:20 PM

## 2020-12-28 NOTE — Discharge Summary (Signed)
Physician Discharge Summary  Patient ID: Samuel Barnes MRN: [id number] DOB/AGE: [birth year] 70 y.o.  Admit date: 12/26/2020 Discharge date: 12/28/2020  Admission Diagnoses:Dural AV fistula\, left occipital  Discharge Diagnoses:  Principal Problem:   S/P craniotomy Active Problems:   Cerebrovascular malformation, dural AV fistula   Discharged Condition: good  Hospital Course: Samuel Barnes was admitted and taken to the operating room where his dural av fistula was ligated. Post op he is alert, oriented x 4, speech is clear and fluent Moving all extremities well. Wound is clean, dry, and without signs of infection. He is voiding, ambulating and tolerating a regular diet  Treatments: surgery: as above, left occipital craniotomy for av fistula ligation.   Discharge Exam: Blood pressure 119/61, pulse 65, temperature 98 F (36.7 C), temperature source Oral, resp. rate 17, height 5\' 9"  (1.753 m), weight 88 kg, SpO2 94 %. General appearance: alert, cooperative, and appears stated age Neurologic: Alert and oriented X 3, normal strength and tone. Normal symmetric reflexes. Normal coordination and gait  Disposition: Discharge disposition: 01-Home or Self Care      Arteriovenous malformation  Allergies as of 12/28/2020       Reactions   Ciprofloxacin Shortness Of Breath, Other (See Comments)   Numbness in extremities   Gabapentin Other (See Comments)   Mental problems, depression, anger   Chlorhexidine Gluconate Other (See Comments)   Burning and itching        Medication List     TAKE these medications    acetaminophen 500 MG tablet Commonly known as: TYLENOL Take 1,000 mg by mouth every 6 (six) hours as needed (pain.).   albuterol 108 (90 Base) MCG/ACT inhaler Commonly known as: VENTOLIN HFA Inhale 2 puffs into the lungs every 4 (four) hours as needed for wheezing or shortness of breath.   amiodarone 200 MG tablet Commonly known as: PACERONE Take 1  tablet (200 mg) by mouth twice daily till 08/17/2020 and then take 1 tablet (200 mg) once daily from 08/18/2020 onwards as per cardiology What changed:  how much to take how to take this when to take this   amLODipine 5 MG tablet Commonly known as: NORVASC Take 1 tablet (5 mg total) by mouth daily.   B-12 1000 MCG Tabs Take 1 tablet (1,000 mcg total) by mouth daily.   Eliquis 5 MG Tabs tablet Generic drug: apixaban Take 1 tablet (5 mg total) by mouth 2 (two) times daily.   FLUoxetine 20 MG capsule Commonly known as: PROZAC Take 20 mg by mouth at bedtime.   folic acid 1 MG tablet Commonly known as: FOLVITE Take 1 tablet (1 mg total) by mouth daily.   furosemide 20 MG tablet Commonly known as: LASIX TAKE 1 TABLET BY MOUTH AS NEEDED What changed:  when to take this reasons to take this   ketoconazole 2 % cream Commonly known as: NIZORAL Apply 1 application topically 2 (two) times daily. GROIN AREA   ketorolac 0.5 % ophthalmic solution Commonly known as: ACULAR SMARTSIG:In Eye(s)   latanoprost 0.005 % ophthalmic solution Commonly known as: XALATAN PLACE 1 DROP INTO THE LEFT EYE DAILY FOR 28 DAYS.   lovastatin 20 MG tablet Commonly known as: MEVACOR Take 20 mg by mouth daily.   metoprolol succinate 25 MG 24 hr tablet Commonly known as: Toprol XL Take 1 tablet (25 mg total) by mouth daily.   montelukast 10 MG tablet Commonly known as: SINGULAIR Take 10 mg by mouth daily.   multivitamin with minerals Tabs  tablet Take 1 tablet by mouth daily.   oxyCODONE 5 MG immediate release tablet Commonly known as: Roxicodone Take 2 tablets (10 mg total) by mouth every 6 (six) hours as needed for up to 8 days for severe pain.   polyethylene glycol 17 g packet Commonly known as: MIRALAX / GLYCOLAX Take 17 g by mouth daily as needed for mild constipation.   rivastigmine 1.5 MG capsule Commonly known as: EXELON Take 2 capsules (3 mg total) by mouth 2 (two) times daily.    terazosin 5 MG capsule Commonly known as: HYTRIN Take 5 mg by mouth in the morning.   thiamine 100 MG tablet Take 1 tablet (100 mg total) by mouth daily.   tiotropium 18 MCG inhalation capsule Commonly known as: SPIRIVA Place 1 capsule (18 mcg total) into inhaler and inhale daily.        Follow-up Information     [name], MD Follow up.   Specialty: Neurosurgery Why: keep your appointment Contact information: [address] [place] [address] [phone]                 Signed: [name] 12/28/2020, 8:48 PM

## 2020-12-28 NOTE — Evaluation (Signed)
Physical Therapy Evaluation Patient Details Name: Samuel Barnes MRN: [id number] DOB: 16-Mar-1950 Today's Date: 12/28/2020  History of Present Illness  70 y/o male admitted on 12/26/20 following L suboccipital craniotomy for arteriovenous malformation. 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 PMH: aortic atherosclerosis, HTN, COPD, A-fib on Eliquis, diastolic CHF, stroke  Clinical Impression  Patient admitted following above procedure. Patient reports multiple falls at home. Patient presents with generalized weakness, impaired balance, and decreased activity tolerance. Patient requires min guard for mobility with use of RW. Patient ambulating slower than his usual pace due to "I want to be cautious". Patient will benefit from skilled PT services during acute stay to address listed deficits. Recommend HHPT at discharge to maximize functional independence and safety. Will need supervision initially at home for safety.        Recommendations for follow up therapy are one component of a multi-disciplinary discharge planning process, led by the attending physician.  Recommendations may be updated based on patient status, additional functional criteria and insurance authorization.  Follow Up Recommendations Home health PT    Assistance Recommended at Discharge Intermittent Supervision/Assistance  Functional Status Assessment Patient has had a recent decline in their functional status and demonstrates the ability to make significant improvements in function in a reasonable and predictable amount of time.  Equipment Recommendations  Rolling Alexianna Nachreiner (2 wheels)    Recommendations for Other Services       Precautions / Restrictions Precautions Precautions: Fall Restrictions Weight Bearing Restrictions: No      Mobility  Bed Mobility Overal bed mobility: Needs Assistance Bed Mobility: Sidelying to Sit   Sidelying to sit: Supervision             Transfers Overall transfer level: Needs assistance Equipment used: Rolling Rihan Schueler (2 wheels) Transfers: Sit to/from Stand Sit to Stand: Min guard           General transfer comment: cues for hand placement    Ambulation/Gait Ambulation/Gait assistance: Min guard Gait Distance (Feet): 150 Feet Assistive device: Rolling Gregoire Bennis (2 wheels) Gait Pattern/deviations: Step-through pattern;Decreased stride length Gait velocity: decreased Gait velocity interpretation: 1.31 - 2.62 ft/sec, indicative of limited community ambulator   General Gait Details: min guard for safety. Patient stating he usually walks faster but wants to be cautious  Information systems manager [place] (Stroke Patients Only)       Balance Overall balance assessment: Needs assistance Sitting-balance support: Feet unsupported Sitting balance-Leahy Scale: Fair     Standing balance support: Reliant on assistive device for balance Standing balance-Leahy Scale: Poor Standing balance comment: history of falls at his home                             Pertinent Vitals/Pain Pain Assessment: Faces Faces Pain Scale: Hurts little more Pain Location: Headache Pain Descriptors / Indicators: Aching Pain Intervention(s): Monitored during session;Repositioned    Home Living Family/patient expects to be discharged to:: Private residence Living Arrangements: Alone Available Help at Discharge: Family;Available PRN/intermittently Type of Home: House Home Access: Stairs to enter Entrance Stairs-Rails: None Entrance Stairs-Number of Steps: 1 step in the back; 6-7 in front but doesn't enter through front   Home Layout: One level Home Equipment: Agricultural consultant (2 wheels);BSC/3in1;Shower seat;Cane - single point;Rollator (4 wheels)      Prior Function Prior Level of Function : History of  Falls (last six months);Independent/Modified Independent             Mobility  Comments: Uses rollator for long distances but no AD in the home due to furniture [name]. Reports 4 falls in past 6 months ADLs Comments: sits to bathe     Hand Dominance   Dominant Hand: Right    Extremity/Trunk Assessment   Upper Extremity Assessment Upper Extremity Assessment: Defer to OT evaluation    Lower Extremity Assessment Lower Extremity Assessment: Generalized weakness    Cervical / Trunk Assessment Cervical / Trunk Assessment: Normal  Communication   Communication: No difficulties  Cognition Arousal/Alertness: Awake/alert Behavior During Therapy: WFL for tasks assessed/performed Overall Cognitive Status: Within Functional Limits for tasks assessed                                          General Comments      Exercises     Assessment/Plan    PT Assessment Patient needs continued PT services  PT Problem List Decreased strength;Decreased activity tolerance;Decreased balance;Decreased mobility;Decreased safety awareness       PT Treatment Interventions DME instruction;Gait training;Stair training;Functional mobility training;Therapeutic activities;Therapeutic exercise;Balance training;Patient/family education    PT Goals (Current goals can be found in the Care Plan section)  Acute Rehab PT Goals Patient Stated Goal: to go home PT Goal Formulation: With patient Time For Goal Achievement: 01/11/21 Potential to Achieve Goals: Good    Frequency Min 3X/week   Barriers to discharge        Co-evaluation PT/OT/SLP Co-Evaluation/Treatment: Yes Reason for Co-Treatment: For patient/therapist safety;To address functional/ADL transfers PT goals addressed during session: Mobility/safety with mobility;Balance OT goals addressed during session: ADL's and self-care       AM-PAC PT "6 Clicks" Mobility  Outcome Measure Help needed turning from your back to your side while in a flat bed without using bedrails?: A Little Help needed moving from  lying on your back to sitting on the side of a flat bed without using bedrails?: A Little Help needed moving to and from a bed to a chair (including a wheelchair)?: A Little Help needed standing up from a chair using your arms (e.g., wheelchair or bedside chair)?: A Little Help needed to walk in hospital room?: A Little Help needed climbing 3-5 steps with a railing? : A Little 6 Click Score: 18    End of Session Equipment Utilized During Treatment: Gait belt Activity Tolerance: Patient tolerated treatment well Patient left: in chair;with call bell/phone within reach;with chair alarm set Nurse Communication: Mobility status PT Visit Diagnosis: Unsteadiness on feet (R26.81);Muscle weakness (generalized) (M62.81);History of falling (Z91.81)    Time: [id number] PT Time Calculation (min) (ACUTE ONLY): 25 min   Charges:   PT Evaluation $PT Eval Moderate Complexity: 1 Mod          Cordia Miklos A. Gilford Rile PT, DPT Acute Rehabilitation Services Pager [phone] Office [phone]   Linna Hoff 12/28/2020, 12:38 PM

## 2021-01-09 ENCOUNTER — Other Ambulatory Visit: Payer: Self-pay | Admitting: Neurology

## 2021-01-15 ENCOUNTER — Other Ambulatory Visit: Payer: Self-pay

## 2021-01-15 ENCOUNTER — Telehealth (HOSPITAL_BASED_OUTPATIENT_CLINIC_OR_DEPARTMENT_OTHER): Payer: Self-pay | Admitting: Cardiology

## 2021-01-15 DIAGNOSIS — I480 Paroxysmal atrial fibrillation: Secondary | ICD-10-CM

## 2021-01-15 DIAGNOSIS — I10 Essential (primary) hypertension: Secondary | ICD-10-CM

## 2021-01-15 MED ORDER — RIVASTIGMINE TARTRATE 1.5 MG PO CAPS: 3.0000 mg | ORAL_CAPSULE | Freq: Two times a day (BID) | ORAL | 2 refills | Status: DC

## 2021-01-15 MED ORDER — METOPROLOL SUCCINATE ER 25 MG PO TB24: 25.0000 mg | ORAL_TABLET | Freq: Every day | ORAL | 1 refills | Status: DC

## 2021-01-15 NOTE — Progress Notes (Signed)
Rx refilled.

## 2021-01-15 NOTE — Telephone Encounter (Signed)
Received fax from [facility] requesting refills for Metoprolol Succ 25 mg QD. Rx request sent to pharmacy.

## 2021-01-22 ENCOUNTER — Telehealth: Payer: Self-pay | Admitting: Cardiology

## 2021-01-22 DIAGNOSIS — I480 Paroxysmal atrial fibrillation: Secondary | ICD-10-CM

## 2021-01-22 NOTE — Telephone Encounter (Signed)
Spoke with the patient's daughter who states that the patient thinks that he is back in Afib. She states that his BP cuff started telling him yesterday that his HR was irregular. She states that he is not having any symptoms and that he is feeling fine. HR has been normal in the 60s. She confirms that he is taking Eliquis, amiodarone, and metoprolol as prescribed. Advised to continue on current medications. Patient had an unsuccessful cardioversion on 10/31/20, but self converted to NSR in November 2022.

## 2021-01-22 NOTE — Telephone Encounter (Signed)
Patient c/o Palpitations:  High priority if patient c/o lightheadedness, shortness of breath, or chest pain  How long have you had palpitations/irregular HR/ Afib? Are you having the symptoms now? Thinks yesterday, yes but does not feel  it  Are you currently experiencing lightheadedness, SOB or CP? no  Do you have a history of afib (atrial fibrillation) or irregular heart rhythm? yes  Have you checked your BP or HR? (document readings if available): yesterday BP was a little low 125/67 112/58  Are you experiencing any other symptoms? no   Patient's daughter states the patient noticed on his BP machine that his heart has been out of rhythm. She states he does not have symptoms.

## 2021-01-23 NOTE — Telephone Encounter (Signed)
Spoke with the patient's daughter and advised on Dr. Norris Cross recommendation for the patient to be seen in the AFIB clinic. Patient's daughter request that she be contacted with the date and time of the appointment.

## 2021-01-26 ENCOUNTER — Telehealth: Payer: Self-pay | Admitting: Neurology

## 2021-01-26 NOTE — Telephone Encounter (Signed)
Pt's daughter, Kaleen Odea (do not have DPR on file) received letter from [employer] stating, will only cover 2 capsules a day for rivastigmine (EXELON) 1.5 MG capsule. Want to if can neither increase mg or suggest another  Medication.  Also want to know does he needs to continue taking vitamin B12 1000 mg tablet.  Would like a call from the nurse.

## 2021-01-29 ENCOUNTER — Other Ambulatory Visit: Payer: Self-pay | Admitting: Neurology

## 2021-01-29 MED ORDER — RIVASTIGMINE TARTRATE 3 MG PO CAPS: 3.0000 mg | ORAL_CAPSULE | Freq: Two times a day (BID) | ORAL | 6 refills | Status: DC

## 2021-01-29 NOTE — Telephone Encounter (Signed)
I returned the call to the patient's daughter. She has been provided with Dr. Karie Georges response. She verbalized understanding of the treatment plan.

## 2021-01-29 NOTE — Telephone Encounter (Signed)
Please call and inform patient that Rivastigmine capsules have been changed to 3 mg . Please take 1 tab twice daily. Also inform patient to continue with his B12 supplement daily.

## 2021-01-30 ENCOUNTER — Ambulatory Visit (HOSPITAL_COMMUNITY)
Admission: RE | Admit: 2021-01-30 | Discharge: 2021-01-30 | Disposition: A | Discharge status: Home / Self Care | Payer: Medicare Other | Source: Ambulatory Visit | Attending: Physician Assistant | Admitting: Physician Assistant

## 2021-01-30 ENCOUNTER — Other Ambulatory Visit: Payer: Self-pay

## 2021-01-30 ENCOUNTER — Encounter (HOSPITAL_COMMUNITY): Payer: Self-pay | Admitting: Physician Assistant

## 2021-01-30 VITALS — BP 110/74 | HR 71 | Ht 69.0 in | Wt 197.0 lb

## 2021-01-30 DIAGNOSIS — Z87891 Personal history of nicotine dependence: Secondary | ICD-10-CM | POA: Diagnosis not present

## 2021-01-30 DIAGNOSIS — I110 Hypertensive heart disease with heart failure: Secondary | ICD-10-CM | POA: Insufficient documentation

## 2021-01-30 DIAGNOSIS — I2510 Atherosclerotic heart disease of native coronary artery without angina pectoris: Secondary | ICD-10-CM | POA: Insufficient documentation

## 2021-01-30 DIAGNOSIS — I4819 Other persistent atrial fibrillation: Secondary | ICD-10-CM

## 2021-01-30 DIAGNOSIS — D6869 Other thrombophilia: Secondary | ICD-10-CM | POA: Diagnosis not present

## 2021-01-30 DIAGNOSIS — J9611 Chronic respiratory failure with hypoxia: Secondary | ICD-10-CM | POA: Insufficient documentation

## 2021-01-30 DIAGNOSIS — E785 Hyperlipidemia, unspecified: Secondary | ICD-10-CM | POA: Insufficient documentation

## 2021-01-30 DIAGNOSIS — Z79899 Other long term (current) drug therapy: Secondary | ICD-10-CM | POA: Diagnosis not present

## 2021-01-30 DIAGNOSIS — I5032 Chronic diastolic (congestive) heart failure: Secondary | ICD-10-CM | POA: Insufficient documentation

## 2021-01-30 LAB — COMPREHENSIVE METABOLIC PANEL
ALT: 43 U/L (ref 0–44)
AST: 30 U/L (ref 15–41)
Albumin: 3.3 g/dL — ABNORMAL LOW (ref 3.5–5.0)
Alkaline Phosphatase: 118 U/L (ref 38–126)
Anion gap: 9 (ref 5–15)
BUN: 11 mg/dL (ref 8–23)
CO2: 26 mmol/L (ref 22–32)
Calcium: 9 mg/dL (ref 8.9–10.3)
Chloride: 97 mmol/L — ABNORMAL LOW (ref 98–111)
Creatinine, Ser: 0.64 mg/dL (ref 0.61–1.24)
GFR, Estimated: >60 mL/min (ref >60)
Glucose, Bld: 85 mg/dL (ref 70–99)
Potassium: 4.7 mmol/L (ref 3.5–5.1)
Sodium: 132 mmol/L — ABNORMAL LOW (ref 135–145)
Total Bilirubin: 0.5 mg/dL (ref 0.3–1.2)
Total Protein: 6.7 g/dL (ref 6.5–8.1)

## 2021-01-30 LAB — TSH: TSH: 3.833 u[IU]/mL (ref 0.350–4.500)

## 2021-01-30 NOTE — Progress Notes (Signed)
Primary Care Physician: Manfred Shirts, PA Primary Cardiologist: Dr Radford Pax Primary Electrophysiologist: none Referring Physician: Dr Sherin Quarry is a 71 y.o. male with a history of HFpEF, HTN, HLD, CAD, chronic hypoxic respiratory failure, tobacco abuse, alcohol abuse, atrial fibrillation who presents for consultation in the [facility]. Patient was admitted 07/28/2020 - 08/08/2020 with acute on chronic diastolic heart failure, new onset atrial fibrillation, and pleural effusion. Underwent right thoracentesis centesis. TEE 08/02/20 LVEF 55-60%, no left atrial appendage thrombus. DCCV subsequently performed. Only brief restoration of NSR. He was started on amiodarone. Patient is on Eliquis for a CHADS2VASC score of 4. He underwent DCCV on 10/31/20 which was unsuccessful but when he presented for follow up, he had converted to [place]. His daughter called [facility] with irregular beats noted on his BP machine. He was asymptomatic and his heart rate was at his baseline (60s-70s). He denies any missed doses of amiodarone.   Today, he denies symptoms of palpitations, chest pain, orthopnea, PND, lower extremity edema, dizziness, presyncope, syncope, snoring, daytime somnolence, bleeding, or neurologic sequela. The patient is tolerating medications without difficulties and is otherwise without complaint today.    Atrial Fibrillation Risk Factors:  he does not have symptoms or diagnosis of sleep apnea. he does not have a history of rheumatic fever. he does have a history of alcohol use.   he has a BMI of Body mass index is 29.09 kg/m.Marland Kitchen Filed Weights   01/30/21 1350  Weight: 89.4 kg    Family History  Problem Relation Age of Onset   Coronary artery disease Mother    Hypertension Mother    Hyperlipidemia Mother    Ovarian cancer Mother    Ovarian cancer Sister    Liver cancer Sister    Liver cancer Brother      Atrial Fibrillation Management  history:  Previous antiarrhythmic drugs: amiodarone  Previous cardioversions: 08/02/20, 10/31/20 Previous ablations: none CHADS2VASC score: 4 Anticoagulation history: Eliquis   Past Medical History:  Diagnosis Date   Anxiety    Aortic atherosclerosis ([place]) 07/28/2020   Atrial fibrillation ([facility])    BPH (benign prostatic hyperplasia)    CHF (congestive heart failure) ([place])    Class 2 obesity 07/28/2020   COPD (chronic obstructive pulmonary disease) ([place])    Coronary artery calcification 07/28/2020   Dementia ([place])    Depression    Family history of adverse reaction to anesthesia    Hyperlipidemia 07/28/2020   Hypertension    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   Tobacco use 07/28/2020   Past Surgical History:  Procedure Laterality Date   APPLICATION OF CRANIAL NAVIGATION N/A 12/26/2020   Procedure: APPLICATION OF CRANIAL NAVIGATION;  Surgeon: Ashok Pall, MD;  Location: [place];  Service: Neurosurgery;  Laterality: N/A;  RM 21   CARDIOVERSION N/A 08/02/2020   Procedure: CARDIOVERSION;  Surgeon: Freada Bergeron, MD;  Location: [facility] ENDOSCOPY;  Service: Cardiovascular;  Laterality: N/A;   CARDIOVERSION N/A 10/31/2020   Procedure: CARDIOVERSION;  Surgeon: Berniece Salines, DO;  Location: [place];  Service: Cardiovascular;  Laterality: N/A;   CATARACT EXTRACTION Bilateral 11/2020   CRANIOTOMY Left 12/26/2020   Procedure: Left Subocciptal Cranitomy;  Surgeon: Ashok Pall, MD;  Location: [place];  Service: Neurosurgery;  Laterality: Left;   IR ANGIO INTRA EXTRACRAN SEL COM CAROTID INNOMINATE BILAT MOD SED  10/04/2020   IR ANGIO VERTEBRAL SEL SUBCLAVIAN INNOMINATE BILAT MOD SED  10/03/2020   IR THORACENTESIS ASP PLEURAL SPACE W/IMG GUIDE  07/31/2020   IR US GUIDE VASC ACCESS RIGHT  10/04/2020   TEE WITHOUT CARDIOVERSION N/A 08/02/2020   Procedure: TRANSESOPHAGEAL ECHOCARDIOGRAM (TEE);  Surgeon: Freada Bergeron,  MD;  Location: [facility] ENDOSCOPY;  Service: Cardiovascular;  Laterality: N/A;   TRANSURETHRAL RESECTION OF PROSTATE      Current Outpatient Medications  Medication Sig Dispense Refill   acetaminophen (TYLENOL) 500 MG tablet Take 1,000 mg by mouth every 6 (six) hours as needed (pain.).     albuterol (VENTOLIN HFA) 108 (90 Base) MCG/ACT inhaler Inhale 2 puffs into the lungs every 4 (four) hours as needed for wheezing or shortness of breath. 8.5 g 0   amiodarone (PACERONE) 200 MG tablet Take 1 tablet (200 mg) by mouth twice daily till 08/17/2020 and then take 1 tablet (200 mg) once daily from 08/18/2020 onwards as per cardiology 30 tablet 0   amLODipine (NORVASC) 5 MG tablet Take 1 tablet (5 mg total) by mouth daily. 90 tablet 1   apixaban (ELIQUIS) 5 MG TABS tablet Take 1 tablet (5 mg total) by mouth 2 (two) times daily. 60 tablet 0   FLUoxetine (PROZAC) 20 MG capsule Take 20 mg by mouth at bedtime.     folic acid (FOLVITE) 1 MG tablet Take 1 tablet (1 mg total) by mouth daily. 30 tablet 0   furosemide (LASIX) 20 MG tablet TAKE 1 TABLET BY MOUTH AS NEEDED 30 tablet 11   ketoconazole (NIZORAL) 2 % cream Apply 1 application topically 2 (two) times daily. GROIN AREA     ketorolac (ACULAR) 0.5 % ophthalmic solution      lovastatin (MEVACOR) 20 MG tablet Take 20 mg by mouth daily.     metoprolol succinate (TOPROL XL) 25 MG 24 hr tablet Take 1 tablet (25 mg total) by mouth daily. 90 tablet 1   montelukast (SINGULAIR) 10 MG tablet Take 10 mg by mouth daily.     Multiple Vitamin (MULTIVITAMIN WITH MINERALS) TABS tablet Take 1 tablet by mouth daily.     oxyCODONE (OXY IR/ROXICODONE) 5 MG immediate release tablet Take 5 mg by mouth every 6 (six) hours as needed.     polyethylene glycol (MIRALAX / GLYCOLAX) 17 g packet Take 17 g by mouth daily as needed for mild constipation. 14 each 0   rivastigmine (EXELON) 3 MG capsule Take 1 capsule (3 mg total) by mouth 2 (two) times daily. 60 capsule 6   terazosin (HYTRIN)  5 MG capsule Take 5 mg by mouth in the morning.     thiamine (VITAMIN B-1) 100 MG tablet Take 100 mg by mouth daily.     tiotropium (SPIRIVA) 18 MCG inhalation capsule Place 1 capsule (18 mcg total) into inhaler and inhale daily. 30 capsule 11   No current facility-administered medications for this encounter.    Allergies  Allergen Reactions   Ciprofloxacin Shortness Of Breath and Other (See Comments)    Numbness in extremities    Gabapentin Other (See Comments)    Mental problems, depression, anger   Chlorhexidine Gluconate Other (See Comments)    Burning and itching    Social History   Socioeconomic History   Marital status: Divorced    Spouse name: Not on file   Number of children: Not on file   Years of education: Not on file   Highest education level: Not on file  Occupational History   Not on file  Tobacco Use   Smoking status: Former  Types: Cigarettes    Start date: 57    Quit date: 08/14/2020    Years since quitting: 0.4   Smokeless tobacco: Never   Tobacco comments:    Former smoker 01/30/21  Vaping Use   Vaping Use: Never used  Substance and Sexual Activity   Alcohol use: Not Currently    Alcohol/week: 1.0 standard drink    Types: 1 Shots of liquor per week   Drug use: Never   Sexual activity: Not on file  Other Topics Concern   Not on file  Social History Narrative   Not on file   Social Determinants of Health   Financial Resource Strain: Not on file  Food Insecurity: Not on file  Transportation Needs: Not on file  Physical Activity: Not on file  Stress: Not on file  Social Connections: Not on file  Intimate Partner Violence: Not on file     ROS- All systems are reviewed and negative except as per the HPI above.  Physical Exam: Vitals:   01/30/21 1350  BP: 110/74  Pulse: 71  Weight: 89.4 kg  Height: 5\' 9"  (1.753 m)    GEN- The patient is a well appearing male, alert and oriented x 3 today.   Head- normocephalic, atraumatic Eyes-   Sclera clear, conjunctiva pink Ears- hearing intact Oropharynx- clear Neck- supple  Lungs- Clear to ausculation bilaterally, normal work of breathing Heart- Regular rate and rhythm, no murmurs, rubs or gallops  GI- soft, NT, ND, + BS Extremities- no clubbing, cyanosis, or edema MS- no significant deformity or atrophy Skin- no rash or lesion Psych- euthymic mood, full affect Neuro- strength and sensation are intact  Wt Readings from Last 3 Encounters:  01/30/21 89.4 kg  12/26/20 88 kg  11/14/20 91.2 kg    EKG today demonstrates  SR, PACs Vent. rate 71 BPM PR interval 156 ms QRS duration 80 ms QT/QTcB 402/436 ms  Echo 07/29/20 demonstrated  1. Left ventricular ejection fraction, by estimation, is 55 to 60%. The  left ventricle has normal function. The left ventricle has no regional  wall motion abnormalities. Left ventricular diastolic function could not be evaluated.   2. Prominent moderator band of no clinical significance. Right  ventricular systolic function is mildly reduced. The right ventricular  size is normal. There is mildly elevated pulmonary artery systolic  pressure. The estimated right ventricular systolic  pressure is [id number] mmHg.   3. The mitral valve is normal in structure. No evidence of mitral valve regurgitation. No evidence of mitral stenosis.   4. The aortic valve is normal in structure. Aortic valve regurgitation is not visualized. No aortic stenosis is present.   5. The inferior vena cava is dilated in size with <50% respiratory  variability, suggesting right atrial pressure of 15 mmHg.   Epic records are reviewed at length today  CHA2DS2-VASc Score = 4  The patient's score is based upon: CHF History: 1 HTN History: 1 Diabetes History: 0 Stroke History: 0 Vascular Disease History: 1 Age Score: 1 Gender Score: 0       ASSESSMENT AND PLAN: 1. Persistent Atrial Fibrillation (ICD10:  I48.19) The patient's CHA2DS2-VASc score is 4, indicating a  4.8% annual risk of stroke.   Patient in [place]. Suspect PACs are responsible for irregular beat on BP machine.  Continue amiodarone 200 mg daily. Check cmet/TSH today. Continue Toprol 25 mg daily Continue Eliquis 5 mg BID  2. Secondary Hypercoagulable State (ICD10:  D68.69) The patient is at significant risk for  stroke/thromboembolism based upon his CHA2DS2-VASc Score of 4.  Continue Apixaban (Eliquis).   3. HFpEF Appears euvolemic today.  4. CAD Coronary calcification on CT No anginal symptoms.   Follow up with Dr Radford Pax as scheduled.    [facility] [address] [place], [place] [address] [phone] 01/30/2021 2:06 PM

## 2021-01-31 ENCOUNTER — Other Ambulatory Visit (HOSPITAL_BASED_OUTPATIENT_CLINIC_OR_DEPARTMENT_OTHER): Payer: Self-pay | Admitting: Neurosurgery

## 2021-01-31 DIAGNOSIS — Q2730 Arteriovenous malformation, site unspecified: Secondary | ICD-10-CM

## 2021-02-01 ENCOUNTER — Ambulatory Visit (HOSPITAL_BASED_OUTPATIENT_CLINIC_OR_DEPARTMENT_OTHER)
Admission: RE | Admit: 2021-02-01 | Discharge: 2021-02-01 | Disposition: A | Discharge status: Home / Self Care | Payer: Medicare Other | Source: Ambulatory Visit | Attending: Neurosurgery | Admitting: Neurosurgery

## 2021-02-01 ENCOUNTER — Other Ambulatory Visit: Payer: Self-pay

## 2021-02-01 DIAGNOSIS — G9389 Other specified disorders of brain: Secondary | ICD-10-CM | POA: Diagnosis not present

## 2021-02-01 DIAGNOSIS — Z9889 Other specified postprocedural states: Secondary | ICD-10-CM | POA: Insufficient documentation

## 2021-02-01 DIAGNOSIS — Q2730 Arteriovenous malformation, site unspecified: Secondary | ICD-10-CM | POA: Diagnosis not present

## 2021-02-01 DIAGNOSIS — G939 Disorder of brain, unspecified: Secondary | ICD-10-CM | POA: Insufficient documentation

## 2021-02-19 NOTE — Progress Notes (Signed)
Synopsis: Referred for follow up of acute on chronic hypoxic respiratory failure by Manfred Shirts, PA  Subjective:   PATIENT ID: Samuel Barnes GENDER: male DOB: 08-26-1950, MRN: [id number]  Chief Complaint  Patient presents with   Follow-up    Breathing is doing well. He is no longer bothered by a cough.     71yM with HTN, chart history of COPD but no PFTs - on dulera/albuterol since hospitalization, smoking 50+ pack-year, recently discovered AF, diastolic dysfunction who is seen in follow up after recent hospitalization for acute on chronic diastolic heart failure with new onset AF s/p TEE/DCCV and initiation of amiodarone/DOAC, found to also have pleural effusion during hospitalization. Diuresed about 20 lb!  Had thoracentesis 07/31/20, exudative. Notably had a bunch of remote healed R rib fx seen on CTA Chest 07/28/20. Had thoracentesis about 3 years ago after a fall and broken ribs - also bloody-appearing.   He has had no AECOPD this year, has been prescribed prednisone at least once in remote past for AECOPD - he does think he had a symptomatic response to it. Does think breathing treatments have been helpful - nebulizer is easier for him to use.   He worked as a Manufacturing engineer. 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 No pets at home, never has lived outside of [place].  Father had lung cancer.   Interval HPI: Last seen by me 08/2020 at which point switched to spiriva in setting AF rate control issues. PFTs with moderately severe obstruction, air trapping. Mildly reduced DLCO. Borderline BD response.  Unfortunately had admissions in 09/2020, 12/2020 for [place] - latter involving craniotomy for AVM ligation but he seems to be doing remarkably well after this.  Now only gets winded when he walks uphill. No courses of prednisone since last visit.   He is using incruse 1 puff once daily  Otherwise pertinent review of  systems is negative.  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   Tobacco use 07/28/2020     Family History  Problem Relation Age of Onset   Coronary artery disease Mother    Hypertension Mother    Hyperlipidemia Mother    Ovarian cancer Mother    Ovarian cancer Sister    Liver cancer Sister    Liver cancer Brother      Past Surgical History:  Procedure Laterality Date   APPLICATION OF CRANIAL NAVIGATION N/A 12/26/2020   Procedure: APPLICATION OF CRANIAL NAVIGATION;  Surgeon: Ashok Pall, MD;  Location: [place];  Service: Neurosurgery;  Laterality: N/A;  RM 21   CARDIOVERSION N/A 08/02/2020   Procedure: CARDIOVERSION;  Surgeon: Freada Bergeron, MD;  Location[facility] END[facility]COPY;  Service: Cardiovascular;  Laterality: N/A;   CARDIOVERSION N/A 10/31/2020   Procedure: CARDIOVERSION;  Surgeon: Berniece Salines, DO;  Location: [place];  Service: Cardiovascular;  Laterality: N/A;   CATARACT EXTRACTION Bilateral 11/2020  CRANIOTOMY Left 12/26/2020   Procedure: Left Subocciptal Cranitomy;  Surgeon: Ashok Pall, MD;  Location: [place];  Service: Neurosurgery;  Laterality: Left;   IR ANGIO INTRA EXTRACRAN SEL COM CAROTID INNOMINATE BILAT MOD SED  10/04/2020   IR ANGIO VERTEBRAL SEL SUBCLAVIAN INNOMINATE BILAT MOD SED  10/03/2020   IR THORACENTESIS ASP PLEURAL SPACE W/IMG GUIDE  07/31/2020   IR US GUIDE VASC ACCESS RIGHT  10/04/2020   TEE WITHOUT CARDIOVERSION N/A 08/02/2020    Procedure: TRANSESOPHAGEAL ECHOCARDIOGRAM (TEE);  Surgeon: Freada Bergeron, MD;  Location: [facility] ENDOSCOPY;  Service: Cardiovascular;  Laterality: N/A;   TRANSURETHRAL RESECTION OF PROSTATE      Social History   Socioeconomic History   Marital status: Divorced    Spouse name: Not on file   Number of children: Not on file   Years of education: Not on file   Highest education level: Not on file  Occupational History   Not on file  Tobacco Use   Smoking status: Former    Types: Cigarettes    Start date: 53    Quit date: 08/14/2020    Years since quitting: 0.5   Smokeless tobacco: Never   Tobacco comments:    Former smoker 01/30/21  Vaping Use   Vaping Use: Never used  Substance and Sexual Activity   Alcohol use: Not Currently    Alcohol/week: 1.0 standard drink    Types: 1 Shots of liquor per week   Drug use: Never   Sexual activity: Not on file  Other Topics Concern   Not on file  Social History Narrative   Not on file   Social Determinants of Health   Financial Resource Strain: Not on file  Food Insecurity: Not on file  Transportation Needs: Not on file  Physical Activity: Not on file  Stress: Not on file  Social Connections: Not on file  Intimate Partner Violence: Not on file     Allergies  Allergen Reactions   Ciprofloxacin Shortness Of Breath and Other (See Comments)    Numbness in extremities    Gabapentin Other (See Comments)    Mental problems, depression, anger   Chlorhexidine Gluconate Other (See Comments)    Burning and itching     Outpatient Medications Prior to Visit  Medication Sig Dispense Refill   acetaminophen (TYLENOL) 500 MG tablet Take 1,000 mg by mouth every 6 (six) hours as needed (pain.).     albuterol (VENTOLIN HFA) 108 (90 Base) MCG/ACT inhaler Inhale 2 puffs into the lungs every 4 (four) hours as needed for wheezing or shortness of breath. 8.5 g 0   amiodarone (PACERONE) 200 MG tablet Take 1 tablet (200 mg) by mouth twice daily  till 08/17/2020 and then take 1 tablet (200 mg) once daily from 08/18/2020 onwards as per cardiology 30 tablet 0   amLODipine (NORVASC) 5 MG tablet Take 1 tablet (5 mg total) by mouth daily. 90 tablet 1   apixaban (ELIQUIS) 5 MG TABS tablet Take 1 tablet (5 mg total) by mouth 2 (two) times daily. 60 tablet 0   FLUoxetine (PROZAC) 20 MG capsule Take 20 mg by mouth at bedtime.     folic acid (FOLVITE) 1 MG tablet Take 1 tablet (1 mg total) by mouth daily. 30 tablet 0   furosemide (LASIX) 20 MG tablet TAKE 1 TABLET BY MOUTH AS NEEDED 30 tablet 11   ketoconazole (NIZORAL) 2 % cream Apply 1 application topically 2 (two) times daily. GROIN AREA     ketorolac (  ACULAR) 0.5 % ophthalmic solution      lovastatin (MEVACOR) 20 MG tablet Take 20 mg by mouth daily.     metoprolol succinate (TOPROL XL) 25 MG 24 hr tablet Take 1 tablet (25 mg total) by mouth daily. 90 tablet 1   montelukast (SINGULAIR) 10 MG tablet Take 10 mg by mouth daily.     Multiple Vitamin (MULTIVITAMIN WITH MINERALS) TABS tablet Take 1 tablet by mouth daily.     oxyCODONE (OXY IR/ROXICODONE) 5 MG immediate release tablet Take 5 mg by mouth every 6 (six) hours as needed.     polyethylene glycol (MIRALAX / GLYCOLAX) 17 g packet Take 17 g by mouth daily as needed for mild constipation. 14 each 0   rivastigmine (EXELON) 3 MG capsule Take 1 capsule (3 mg total) by mouth 2 (two) times daily. 60 capsule 6   terazosin (HYTRIN) 5 MG capsule Take 5 mg by mouth in the morning.     thiamine (VITAMIN B-1) 100 MG tablet Take 100 mg by mouth daily.     umeclidinium bromide (INCRUSE ELLIPTA) 62.5 MCG/ACT AEPB Inhale 1 puff into the lungs daily. (Patient not taking: Reported on 02/20/2021)     tiotropium (SPIRIVA) 18 MCG inhalation capsule Place 1 capsule (18 mcg total) into inhaler and inhale daily. 30 capsule 11   No facility-administered medications prior to visit.       Objective:   Physical Exam: General appearance: 71 y.o., male, NAD,  conversant  Eyes: anicteric sclerae; PERRL, tracking appropriately HENT: NCAT; MMM Neck: Trachea midline; no lymphadenopathy, no JVD Lungs: CTAB, no crackles, no wheeze, with normal respiratory effort CV: RRR, no murmur  Abdomen: Soft, non-tender; non-distended, BS present  Extremities: No peripheral edema, warm Skin: Normal turgor and texture; no rash Psych: Appropriate affect Neuro: Alert and oriented to person and place, no focal deficit     Vitals:   02/20/21 1431  BP: 126/68  Pulse: (!) 55  Temp: 98.2 F (36.8 C)  TempSrc: Oral  SpO2: 96%  Weight: 204 lb 9.6 oz (92.8 kg)  Height: 5' 6.5" (1.689 m)    96% on RA BMI Readings from Last 3 Encounters:  02/20/21 32.53 kg/m  01/30/21 29.09 kg/m  12/26/20 28.65 kg/m   Wt Readings from Last 3 Encounters:  02/20/21 204 lb 9.6 oz (92.8 kg)  01/30/21 197 lb (89.4 kg)  12/26/20 194 lb (88 kg)     CBC    Component Value Date/Time   WBC 5.4 12/25/2020 0840   RBC 4.20 (L) 12/25/2020 0840   HGB 13.1 12/25/2020 0840   HGB 13.3 10/26/2020 1039   HCT 40.8 12/25/2020 0840   HCT 39.9 10/26/2020 1039   PLT 188 12/25/2020 0840   PLT 218 10/26/2020 1039   MCV 97.1 12/25/2020 0840   MCV 93 10/26/2020 1039   MCH 31.2 12/25/2020 0840   MCHC 32.1 12/25/2020 0840   RDW 14.0 12/25/2020 0840   RDW 12.7 10/26/2020 1039   LYMPHSABS 1.5 09/27/2020 1813   MONOABS 0.8 09/27/2020 1813   EOSABS 0.1 09/27/2020 1813   BASOSABS 0.0 09/27/2020 1813    Pleural fluid 07/31/20: LD 111 (ratio >0.6) Protein <3 Culture neg  Cytology: mixed acute and chronic inflammation. Grossly dark red in color.  Chest Imaging: CXR 8/14 reviewed by me and remarkable for decreased size of now small R pleural effusion, old healing rib fx on right  CT A/P 8/8 reviewed by me and remarkable for R small-moderate effusion and atelectasis  CTA  Chest 07/28/20: reviewed by me and remarkable for R effusion, saber sheath trachea, small pericardial effusion  CT  [facility] OSH report reviewed by me and remarkable for presence of subacute healing fractures of right 4th-9th ribs, rounded atelectasis in right lung base   Pulmonary Functions Testing Results: PFT Results Latest Ref Rng & Units 09/27/2020  FVC-Pre L 2.51  FVC-Predicted Pre % 59  FVC-Post L 2.79  FVC-Predicted Post % 66  Pre FEV1/FVC % % 60  Post FEV1/FCV % % 60  FEV1-Pre L 1.52  FEV1-Predicted Pre % 49  FEV1-Post L 1.68  DLCO uncorrected ml/min/mmHg 19.29  DLCO UNC% % 77  DLCO corrected ml/min/mmHg 18.87  DLCO COR %Predicted % 75  DLVA Predicted % 93  TLC L 7.29  TLC % Predicted % 106  RV % Predicted % 183   Reviewed by me with moderately severe obstruction, air trapping, mildly reduced DLCO   Echocardiogram:   TTE 07/29/20 with dilated IVC, AF could not assess DD  TEE 7/27 dilated LA      Assessment & Plan:    # COPD gold B: doing well now on incruse without exacerbation.  # Smoking: congratulated him on his effort to stop smoking on his own  # R exudative pleural effusion: has been sampled twice. Cyto negative 07/28/20, unclear if sent on thora 3 years ago at [facility]. This is likely chronic sequel of his traumatic fall/rib fractures 3 years ago given timing and fluid characteristics.   Plan: - continue incruse - referral to lung cancer screening program placed last visit - ongoing smoking cessation strongly encouraged, congratulated effort so far - albuterol inhaler or neb as needed for rescue   RTC prn     Maryjane Hurter, MD [facility] 02/20/2021 2:41 PM

## 2021-02-20 ENCOUNTER — Other Ambulatory Visit: Payer: Self-pay

## 2021-02-20 ENCOUNTER — Ambulatory Visit: Payer: Medicare Other | Admitting: Student

## 2021-02-20 ENCOUNTER — Encounter: Payer: Self-pay | Admitting: Student

## 2021-02-20 VITALS — BP 126/68 | HR 55 | Temp 98.2°F | Ht 66.5 in | Wt 204.6 lb

## 2021-02-20 DIAGNOSIS — J449 Chronic obstructive pulmonary disease, unspecified: Secondary | ICD-10-CM

## 2021-02-20 MED ORDER — ALBUTEROL SULFATE HFA 108 (90 BASE) MCG/ACT IN AERS: 2.0000 | INHALATION_SPRAY | RESPIRATORY_TRACT | 0 refills | Status: DC | PRN

## 2021-02-20 MED ORDER — INCRUSE ELLIPTA 62.5 MCG/ACT IN AEPB: 1.0000 | INHALATION_SPRAY | Freq: Every day | RESPIRATORY_TRACT | 11 refills | Status: DC

## 2021-02-20 NOTE — Patient Instructions (Signed)
-   keep using incruse 1 puff once daily - you will be called to schedule ct scan for lung cancer screening this July

## 2021-02-22 ENCOUNTER — Other Ambulatory Visit: Payer: Self-pay

## 2021-02-22 ENCOUNTER — Ambulatory Visit: Payer: Medicare Other | Admitting: Cardiology

## 2021-02-22 ENCOUNTER — Encounter: Payer: Self-pay | Admitting: Cardiology

## 2021-02-22 VITALS — BP 108/60 | HR 51 | Ht 66.5 in | Wt 202.0 lb

## 2021-02-22 DIAGNOSIS — I2584 Coronary atherosclerosis due to calcified coronary lesion: Secondary | ICD-10-CM

## 2021-02-22 DIAGNOSIS — I6523 Occlusion and stenosis of bilateral carotid arteries: Secondary | ICD-10-CM

## 2021-02-22 DIAGNOSIS — I5030 Unspecified diastolic (congestive) heart failure: Secondary | ICD-10-CM

## 2021-02-22 DIAGNOSIS — I10 Essential (primary) hypertension: Secondary | ICD-10-CM | POA: Diagnosis not present

## 2021-02-22 DIAGNOSIS — I480 Paroxysmal atrial fibrillation: Secondary | ICD-10-CM

## 2021-02-22 DIAGNOSIS — E7800 Pure hypercholesterolemia, unspecified: Secondary | ICD-10-CM

## 2021-02-22 DIAGNOSIS — I2510 Atherosclerotic heart disease of native coronary artery without angina pectoris: Secondary | ICD-10-CM

## 2021-02-22 MED ORDER — METOPROLOL SUCCINATE ER 25 MG PO TB24: 25.0000 mg | ORAL_TABLET | Freq: Every day | ORAL | 3 refills | Status: DC

## 2021-02-22 MED ORDER — FUROSEMIDE 20 MG PO TABS: 20.0000 mg | ORAL_TABLET | ORAL | 11 refills | Status: DC | PRN

## 2021-02-22 MED ORDER — LOVASTATIN 20 MG PO TABS: 20.0000 mg | ORAL_TABLET | Freq: Every day | ORAL | 3 refills | Status: DC

## 2021-02-22 MED ORDER — LOSARTAN POTASSIUM 25 MG PO TABS: 25.0000 mg | ORAL_TABLET | Freq: Every day | ORAL | 3 refills | Status: DC

## 2021-02-22 MED ORDER — APIXABAN 5 MG PO TABS: 5.0000 mg | ORAL_TABLET | Freq: Two times a day (BID) | ORAL | 3 refills | Status: DC

## 2021-02-22 NOTE — Patient Instructions (Signed)
Medication Instructions:  Your physician has recommended you make the following change in your medication: 1) STOP Amlodipine 2) START Losartan 25mg  once daily  *If you need a refill on your cardiac medications before your next appointment, please call your pharmacy*   Lab Work: IN ONE WEEK: BMET If you have labs (blood work) drawn today and your tests are completely normal, you will receive your results only by: MyChart Message (if you have MyChart) OR A paper copy in the mail If you have any lab test that is abnormal or we need to change your treatment, we will call you to review the results.   Follow-Up: At [facility], you and your health needs are our priority.  As part of our continuing mission to provide you with exceptional heart care, we have created designated Provider Care Teams.  These Care Teams include your primary Cardiologist (physician) and Advanced Practice Providers (APPs -  Physician Assistants and Nurse Practitioners) who all work together to provide you with the care you need, when you need it.  We recommend signing up for the patient portal called "MyChart".  Sign up information is provided on this After Visit Summary.  MyChart is used to connect with patients for Virtual Visits (Telemedicine).  Patients are able to view lab/test results, encounter notes, upcoming appointments, etc.  Non-urgent messages can be sent to your provider as well.   To learn more about what you can do with MyChart, go to [facility].    Your next appointment:   6 month(s)  The format for your next appointment:   In Person  Provider:   [email or web address], MD

## 2021-02-22 NOTE — Progress Notes (Signed)
Office Visit    Patient Name: Samuel Barnes Date of Encounter: 02/22/2021  PCP:  Manfred Shirts, Gerv[place]  Cardiologist:  Fransico Him, MD  Advanced Practice Provider:  No care team member to display Electrophysiologist:  None    Chief Complaint    Samuel Barnes is a 71 y.o. male with a hx of CHF, chronic hypoxic respiratory failure on 3L O2, atrial fibrillation on Eliquis, BPH, CAD, HLD, tobacco use, obesity, depression, alcohol abuse presents today for follow up  Past Medical History    Past Medical History:  Diagnosis Date   Anxiety    Aortic atherosclerosis ([place]) 07/28/2020   Atrial fibrillation ([facility])    BPH (benign prostatic hyperplasia)    CHF (congestive heart failure) ([place])    Class 2 obesity 07/28/2020   COPD (chronic obstructive pulmonary disease) ([place])    Coronary artery calcification 07/28/2020   Dementia ([place])    Depression    Family history of adverse reaction to anesthesia    Hyperlipidemia 07/28/2020   Hypertension    ICH (intracerebral hemorrhage) ([place]) 09/27/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   Tobacco use 07/28/2020   Past Surgical History:  Procedure Laterality Date   APPLICATION OF CRANIAL NAVIGATION N/A 12/26/2020   Procedure: APPLICATION OF CRANIAL NAVIGATION;  Surgeon: Ashok Pall, MD;  Location: [facility];  Service: Neurosurgery;  Laterality: N/A;  RM 21   CARDIOVERSION N/A 08/02/2020   Procedure: CARDIOVERSION;  Surgeon: Freada Bergeron, MD;  Location: [facility] ENDOSCOPY;  Service: Cardiovascular;  Laterality: N/A;   CARDIOVERSION N/A 10/31/2020   Procedure: CARDIOVERSION;  Surgeon: Berniece Salines, DO;  Location: [facility];  Service: Cardiovascular;  Laterality: N/A;   CATARACT EXTRACTION Bilateral 11/2020   CRANIOTOMY Left 12/26/2020   Procedure: Left Subocciptal Cranitomy;  Surgeon: Ashok Pall, MD;  Location: [place];  Service: Neurosurgery;  Laterality: Left;   IR ANGIO  INTRA EXTRACRAN SEL COM CAROTID INNOMINATE BILAT MOD SED  10/04/2020   IR ANGIO VERTEBRAL SEL SUBCLAVIAN INNOMINATE BILAT MOD SED  10/03/2020   IR THORACENTESIS ASP PLEURAL SPACE W/IMG GUIDE  07/31/2020   IR US GUIDE VASC ACCESS RIGHT  10/04/2020   TEE WITHOUT CARDIOVERSION N/A 08/02/2020   Procedure: TRANSESOPHAGEAL ECHOCARDIOGRAM (TEE);  Surgeon: Freada Bergeron, MD;  Location: [facility] ENDOSCOPY;  Service: Cardiovascular;  Laterality: N/A;   TRANSURETHRAL RESECTION OF PROSTATE      Allergies  Allergies  Allergen Reactions   Ciprofloxacin Shortness Of Breath and Other (See Comments)    Numbness in extremities    Gabapentin Other (See Comments)    Mental problems, depression, anger   Chlorhexidine Gluconate Other (See Comments)    Burning and itching    History of Present Illness    Samuel Barnes is a 71 y.o. male with a hx of CHF, chronic hypoxic respiratory failure on 3L O2, atrial fibrillation on Eliquis, BPH, CAD, HLD, tobacco use, obesity, depression, alcohol abuse last seen 10/18/20  Admitted 07/28/2020 - 08/08/2020 with acute on chronic diastolic heart failure, paroxysmal new onset atrial fibrillation, and pleural effusion.  No prior cardiac imaging or ischemic testing coronary artery calcifications on previous CT scan from 2019.  Underwent right thoracentesis centesis.TEE 08/02/20 LVEF 55-60%, no left atrial appendage thrombus. DCCV subsequently performed. Only brief restoration of NSR. He was diuresed with IV Lasix 9 litersand discharged on p.o. Lasix.  He was admitted 08/14/20-08/21/20 with Wernicke's encephalopathy, syncope, metabolic encephalopathy. CT/MRI brain  unremarkable. Family noted cognitive decline for past few weeks.  08/30/20 labs via [facility] Everywhere: NA 133, AST 38, ALT 78, alkaline phosphatase 158, creatinine 0.69, GFR greater than 90 Hemoglobin 15.4, hematocrit 46.7, platelets 314 B12 486, PSA 2.3, Hemoglobin A1c 5.9  Admitted 09/27/20-10/04/20 after near  syncope and fall. He was diagnosed with ICH. CT head with 6mm acute parenchymal bleed with surrounding edema within left ocipital lobe. 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  Underwent angiography showing "a prominently dilated, early draining vein in the left occipital region, draining into the internal cerebral vein, consistent with shunting.  No clear liters seen.  The pain is flow is anterograde." He was cleared to resume Eliquis on discharge. Given bradycardia, Toprol further reduced to 25mg  QD. Low dose Amlodipine was added for blood pressure control.   Seen in follow-up 10/18/2020.  He was still in atrial fibrillation and scheduled for cardioversion which was performed 10/31/2020 but was unsuccessful.   He was last seen by Laurann Montana, NP on 11/14/2020 and was back in normal sinus rhythm.  No changes were made at that time  He is here today for followup and is doing well.  He denies any chest pain or pressure, SOB, DOE, PND, orthopnea, dizziness, palpitations or syncope.  He occasionally has LE edema and will take Lasix for 3 days and it resolves.  He continues to have problems with chronic HAs and is working with neurosurgery.  He is compliant with his meds and is tolerating meds with no SE.      EKGs/Labs/Other Studies Reviewed:   The following studies were reviewed today:   ECHO:  TEE 08/02/20   1. Left ventricular ejection fraction, by estimation, is 55 to 60%. The  left ventricle has normal function.   2. Right ventricular systolic function is normal. The right ventricular  size is normal.   3. Left atrial size was mild to moderately dilated. No left atrial/left  atrial appendage thrombus was detected.   4. Right atrial size was moderately dilated.   5. The mitral valve is normal in structure. Mild mitral valve  regurgitation. No evidence of mitral stenosis.   6. The aortic valve is tricuspid. There is mild  calcification of the  aortic valve. There is mild thickening of the aortic valve. Aortic valve  regurgitation is not visualized. Mild aortic valve sclerosis is present,  with no evidence of aortic valve  stenosis.   7. There is Moderate (Grade III) plaque.   8. Successful DCCV performed (200J x1) after TEE with return to NSR.  Please see separate note.   EKG: EKG is not performed today   Recent Labs: 08/14/2020: B Natriuretic Peptide 135.2 10/04/2020: Magnesium 2.0 12/25/2020: Hemoglobin 13.1; Platelets 188 01/30/2021: ALT 43; BUN 11; Creatinine, Ser 0.64; Potassium 4.7; Sodium 132; TSH 3.833  Recent Lipid Panel    Component Value Date/Time   CHOL 73 07/30/2020 0555   TRIG 29 07/30/2020 0555   HDL 34 (L) 07/30/2020 0555   CHOLHDL 2.1 07/30/2020 0555   VLDL 6 07/30/2020 0555   LDLCALC 33 07/30/2020 0555    Home Medications   Current Meds  Medication Sig   acetaminophen (TYLENOL) 500 MG tablet Take 1,000 mg by mouth every 6 (six) hours as needed (pain.).   albuterol (VENTOLIN HFA) 108 (90 Base) MCG/ACT inhaler Inhale 2 puffs into the lungs every 4 (four) hours as needed for wheezing or shortness of breath.  amiodarone (PACERONE) 200 MG tablet Take 1 tablet (200 mg) by mouth twice daily till 08/17/2020 and then take 1 tablet (200 mg) once daily from 08/18/2020 onwards as per cardiology   amLODipine (NORVASC) 5 MG tablet Take 1 tablet (5 mg total) by mouth daily.   apixaban (ELIQUIS) 5 MG TABS tablet Take 1 tablet (5 mg total) by mouth 2 (two) times daily.   FLUoxetine (PROZAC) 20 MG capsule Take 20 mg by mouth at bedtime.   folic acid (FOLVITE) 1 MG tablet Take 1 tablet (1 mg total) by mouth daily.   furosemide (LASIX) 20 MG tablet TAKE 1 TABLET BY MOUTH AS NEEDED   ketoconazole (NIZORAL) 2 % cream Apply 1 application topically 2 (two) times daily. GROIN AREA   ketorolac (ACULAR) 0.5 % ophthalmic solution    lovastatin (MEVACOR) 20 MG tablet Take 20 mg by mouth daily.   metoprolol  succinate (TOPROL XL) 25 MG 24 hr tablet Take 1 tablet (25 mg total) by mouth daily.   montelukast (SINGULAIR) 10 MG tablet Take 10 mg by mouth daily.   Multiple Vitamin (MULTIVITAMIN WITH MINERALS) TABS tablet Take 1 tablet by mouth daily.   oxyCODONE (OXY IR/ROXICODONE) 5 MG immediate release tablet Take 5 mg by mouth every 6 (six) hours as needed.   polyethylene glycol (MIRALAX / GLYCOLAX) 17 g packet Take 17 g by mouth daily as needed for mild constipation.   rivastigmine (EXELON) 3 MG capsule Take 1 capsule (3 mg total) by mouth 2 (two) times daily.   terazosin (HYTRIN) 5 MG capsule Take 5 mg by mouth in the morning.   thiamine (VITAMIN B-1) 100 MG tablet Take 100 mg by mouth daily.   umeclidinium bromide (INCRUSE ELLIPTA) 62.5 MCG/ACT AEPB Inhale 1 puff into the lungs daily.     Review of Systems      All other systems reviewed and are otherwise negative except as noted above.  Physical Exam    VS:  BP 108/60    Pulse (!) 51    Ht 5' 6.5" (1.689 m)    Wt 202 lb (91.6 kg)    SpO2 96%    BMI 32.12 kg/m  , BMI Body mass index is 32.12 kg/m.  Wt Readings from Last 3 Encounters:  02/22/21 202 lb (91.6 kg)  02/20/21 204 lb 9.6 oz (92.8 kg)  01/30/21 197 lb (89.4 kg)    GEN: Well nourished, well developed in no acute distress HEENT: Normal NECK: No JVD; No carotid bruits LYMPHATICS: No lymphadenopathy CARDIAC:RRR, no murmurs, rubs, gallops RESPIRATORY:  Clear to auscultation without rales, wheezing or rhonchi  ABDOMEN: Soft, non-tender, non-distended MUSCULOSKELETAL:  No edema; No deformity  SKIN: Warm and dry NEUROLOGIC:  Alert and oriented x 3 PSYCHIATRIC:  Normal affect   Assessment & Plan     PAF / Chronic anticoagulation  - Unsuccessful cardioversion 11/01/20.  -Has since self converted likely 11/10/20 based on home HR readings.  -CHA2DS2-VASc Score = 4 [CHF History: 1, HTN History: 1, Diabetes History: 0, Stroke History: 0, Vascular Disease History: 1, Age Score: 1,  Gender Score: 0].  Therefore, the patient's annual risk of stroke is 4.8 %.     -He is maintaining normal sinus rhythm on exam today denies any palpitations  -Continue prescription drug management with Toprol-XL, amiodarone 200 mg daily and apixaban 5 mg twice daily with as needed refills.   -I have personally reviewed and interpreted outside labs performed by patient's PCP which showed serum creatinine 0.64, potassium  4.7, TSH 3.83 and ALT 43 on 01/30/2021.  Hemoglobin was normal at 13.1 on 12/25/2020.   HTN  -His BP is well controlled on exam today -Due to ongoing HAs, I will stop amlodipine since it is a direct vasodilator  -He will continue prescription drug management with Toprol 25mg  daily with PRN refills -start Losartan 25mg  daily for Bp control since stopping amlodipine  HLD, LDL goal <70  - 7/24 22 LDL 33.  -Continue prescription drug management with lovastatin 20 mg daily with as needed refills   HFpEF  - Echo 07/2020 normal LVEF, gr2DD.   -he appears to be euvolemic on exam today  -Low salt diet and fluid <2L restriction encouraged.  -Continue prescription drug management with with Lasix 20 mg as needed for weight gain of 3 pounds overnight or 5 pounds in 1 week with as needed refills  Coronary artery calcification on CT  - Stable with no anginal symptoms.  -No indication for ischemic evaluation.  -GDMT includes metoprolol, lovastatin.  -No aspirin due to chronic anticoagulation.  Heart healthy diet and regular cardiovascular exercise encouraged.   Carotid artery stenosis -dopplers 8/22 showed 1-39% bilateral stenosis -continue statin -no ASA due to DOAC    Disposition: Follow up in 1 year  Signed, Fransico Him, MD 02/22/2021, 4:11 PM [place]

## 2021-02-22 NOTE — Addendum Note (Signed)
Addended by: Theresia Majors on: 02/22/2021 04:35 PM   Modules accepted: Orders

## 2021-02-22 NOTE — Addendum Note (Signed)
Addended by: Franchot Gallo on: 02/22/2021 04:31 PM   Modules accepted: Orders

## 2021-02-23 ENCOUNTER — Ambulatory Visit: Payer: Medicare Other | Admitting: Cardiology

## 2021-03-02 LAB — BASIC METABOLIC PANEL
BUN/Creatinine Ratio: 18 (ref 10–24)
BUN: 13 mg/dL (ref 8–27)
CO2: 22 mmol/L (ref 20–29)
Calcium: 9.5 mg/dL (ref 8.6–10.2)
Chloride: 98 mmol/L (ref 96–106)
Creatinine, Ser: 0.72 mg/dL — ABNORMAL LOW (ref 0.76–1.27)
Glucose: 104 mg/dL — ABNORMAL HIGH (ref 70–99)
Potassium: 4.8 mmol/L (ref 3.5–5.2)
Sodium: 134 mmol/L (ref 134–144)
eGFR: 98 mL/min/{1.73_m2} (ref >59)

## 2021-03-05 ENCOUNTER — Telehealth: Payer: Self-pay | Admitting: Cardiology

## 2021-03-05 NOTE — Telephone Encounter (Signed)
Samuel Reichert, MD  03/02/2021  8:45 AM EST     Please let patient know that labs were normal.  Continue current medical therapy.    The patient has been notified of the result and verbalized understanding.  All questions (if any) were answered. Cindi Carbon [place], RN 03/05/2021 4:42 PM

## 2021-03-05 NOTE — Telephone Encounter (Signed)
Pt is calling back for results... please advise

## 2021-03-12 ENCOUNTER — Other Ambulatory Visit (HOSPITAL_BASED_OUTPATIENT_CLINIC_OR_DEPARTMENT_OTHER): Payer: Self-pay | Admitting: Family

## 2021-04-25 ENCOUNTER — Ambulatory Visit: Payer: Medicare Other | Admitting: Neurology

## 2021-04-25 ENCOUNTER — Encounter: Payer: Self-pay | Admitting: Neurology

## 2021-04-25 VITALS — BP 149/77 | HR 55 | Ht 66.5 in | Wt 212.0 lb

## 2021-04-25 DIAGNOSIS — F32A Depression, unspecified: Secondary | ICD-10-CM | POA: Diagnosis not present

## 2021-04-25 DIAGNOSIS — G3184 Mild cognitive impairment, so stated: Secondary | ICD-10-CM

## 2021-04-25 DIAGNOSIS — F419 Anxiety disorder, unspecified: Secondary | ICD-10-CM | POA: Diagnosis not present

## 2021-04-25 NOTE — Patient Instructions (Signed)
Continue Rivastigmine 3 mg twice daily  ?Continue your other medications  ?Follow up with your primary care doctor  ?Return in 1 year  ?

## 2021-04-25 NOTE — Progress Notes (Signed)
? ?[facility] ? ?PATIENT: Samuel Barnes ?DOB: October 17, 1950 ? ?REFERRING CLINICIAN: [name], PA ?HISTORY FROM: Patient and Daughter ?REASON FOR VISIT: Worsening memory  ? ? ?HISTORICAL ? ?CHIEF COMPLAINT:  ?Chief Complaint  ?Patient presents with  ? Follow-up  ?  Rm 12. Accompanied by daughter. ?Discuss report from neuropsych. ?[place] on 02/21/2021 at [facility]: 20/30.  ? ?INTERVAL HISTORY 04/25/2021: ?Patient presents today for follow-up, he is accompanied by his daughter.  Since last visit he reported he is doing well.  He is compliant with the rivastigmine 3 mg twice daily.  He did follow-up with neuropsych, test revealed that he had mild cognitive impairment with depression.  He did follow-up with his primary care doctor who switched his Prozac and start him on citalopram.  Since starting the citalopram he is doing much better.  He did also follow-up with his neurosurgeon Dr. 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  ?Per daughter patient is more independent, he is able to do things by himself independently, he still not driving.  He is happy with his current progress.  Reported his memory seems better. ? ? ?HISTORY OF PRESENT ILLNESS:  ?This is a 71 year old man with past medical history of hypertension, hyperlipidemia, depression, alcohol abuse in remission, coronary artery disease and BPH who is presenting with worsening memory.  History provided by patient and daughter.  Per daughter patient lives by himself, he has memory problem described as difficulty remembering things, losing track of time, sometime getting confused about the date, difficulty keeping up with his appointment, not paying his bills on time.  Daughter feels like it takes longer for him to do things as previously.  Previously his sleep was poor, he will get maybe 3 to 4 hours of sleep per night, sometimes he will wake up and wander around the house but not outside the home. He was also  noted to give out his personal information, SSN, trying to get a loan to buy a new house, which are out of characters for him per daughter.  ?Patient also states that he had difficulty remembering things.  To help with that, he will call his daughter or other family member multiple times a day just to tell him things or else he will forget.  Patient reported that he used to be a heavy drinker but due to his recent admission to the hospital he has stopped drinking. ?On September 21 he presented to the ED after a fall and was noted to have a left parieto-occipital region bleed.  He was admitted for further work-up.  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  Patient did have a intracranial angiography that showed a prominent dilated early draining vein in the left occipital region, draining into the internal cerebral vein, consistent with shunting.  He is pending neurosurgery appointment in the month of November.  Daughter reported since being discharged from the hospital he has quit drinking, he was also put on thiamine and folate with multivitamin and since then he seems his memory has markedly improved.  His energy also is improved and his sleep is better, this week, he has been getting 7hrs per night.  He lives alone, daughter helps him with his breakfast and dinner, she also help with his medication.  On occasion he does have Meals on Wheels.  He is actively getting physical  therapy ? ? ? ? ?OTHER MEDICAL CONDITIONS: Hypertension, hyperlipidemia, Afib, depression, alcohol abuse in remission, coronary artery disease and BPH. ? ? ?REVIEW OF SYSTEMS: Full 14 system review of systems performed and negative with exception of: As noted in the HPI ? ?ALLERGIES: ?Allergies  ?Allergen Reactions  ? Ciprofloxacin Shortness Of Breath and Other (See Comments)  ?  Numbness in extremities ?  ? Gabapentin Other  (See Comments)  ?  Mental problems, depression, anger  ? Chlorhexidine Gluconate Other (See Comments)  ?  Burning and itching  ? ? ?HOME MEDICATIONS: ?Outpatient Medications Prior to Visit  ?Medication Sig Dispense Refill  ? acetaminophen (TYLENOL) 500 MG tablet Take 1,000 mg by mouth every 6 (six) hours as needed (pain.).    ? albuterol (VENTOLIN HFA) 108 (90 Base) MCG/ACT inhaler Inhale 2 puffs into the lungs every 4 (four) hours as needed for wheezing or shortness of breath. 8.5 g 0  ? amiodarone (PACERONE) 200 MG tablet Take 1 tablet (200 mg) by mouth twice daily till 08/17/2020 and then take 1 tablet (200 mg) once daily from 08/18/2020 onwards as per cardiology 30 tablet 0  ? apixaban (ELIQUIS) 5 MG TABS tablet Take 1 tablet (5 mg total) by mouth 2 (two) times daily. 180 tablet 3  ? FLUoxetine (PROZAC) 20 MG capsule Take 20 mg by mouth at bedtime.    ? folic acid (FOLVITE) 1 MG tablet Take 1 tablet (1 mg total) by mouth daily. 30 tablet 0  ? furosemide (LASIX) 20 MG tablet Take 1 tablet (20 mg total) by mouth as needed. 30 tablet 11  ? ketoconazole (NIZORAL) 2 % cream Apply 1 application topically 2 (two) times daily. GROIN AREA    ? ketorolac (ACULAR) 0.5 % ophthalmic solution     ? losartan (COZAAR) 25 MG tablet Take 1 tablet (25 mg total) by mouth daily. 90 tablet 3  ? lovastatin (MEVACOR) 20 MG tablet Take 1 tablet (20 mg total) by mouth daily. 90 tablet 3  ? metoprolol succinate (TOPROL XL) 25 MG 24 hr tablet Take 1 tablet (25 mg total) by mouth daily. 90 tablet 3  ? montelukast (SINGULAIR) 10 MG tablet Take 10 mg by mouth daily.    ? Multiple Vitamin (MULTIVITAMIN WITH MINERALS) TABS tablet Take 1 tablet by mouth daily.    ? oxyCODONE (OXY IR/ROXICODONE) 5 MG immediate release tablet Take 5 mg by mouth every 6 (six) hours as needed.    ? polyethylene glycol (MIRALAX / GLYCOLAX) 17 g packet Take 17 g by mouth daily as needed for mild constipation. 14 each 0  ? terazosin (HYTRIN) 5 MG capsule Take 5 mg by  mouth in the morning.    ? thiamine (VITAMIN B-1) 100 MG tablet Take 100 mg by mouth daily.    ? umeclidinium bromide (INCRUSE ELLIPTA) 62.5 MCG/ACT AEPB Inhale 1 puff into the lungs daily. 30 each 11  ? rivastigmine (EXELON) 3 MG capsule Take 1 capsule (3 mg total) by mouth 2 (two) times daily. 60 capsule 6  ? ?No facility-administered medications prior to visit.  ? ? ?PAST MEDICAL HISTORY: ?Past Medical History:  ?Diagnosis Date  ? Anxiety   ? Aortic atherosclerosis ([facility]) 07/28/2020  ? Atrial fibrillation ([facility])   ? BPH (benign prostatic hyperplasia)   ? CHF (congestive heart failure) ([facility])   ? Class 2 obesity 07/28/2020  ? COPD (chronic obstructive pulmonary disease) ([facility])   ? Coronary artery calcification 07/28/2020  ? Dementia ([facility])   ? Depression   ?  Family history of adverse reaction to anesthesia   ? Hyperlipidemia 07/28/2020  ? Hypertension   ? ICH (intracerebral hemorrhage) ([facility]) 09/27/2020  ? 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 Tobacco use 07/28/2020  ? ? ?PAST SURGICAL HISTORY: ?Past Surgical History:  ?Procedure Laterality Date  ? APPLICATION OF CRANIAL NAVIGATION N/A 12/26/2020  ? Procedure: APPLICATION OF CRANIAL NAVIGATION;  Surgeon: Coletta Memosabbell, Kyle, MD;  Location: [facility] OR;  Service: Neurosurgery;  Laterality: N/A;  RM 21  ? CARDIOVERSION N/A 08/02/2020  ? Procedure: CARDIOVERSION;  Surgeon: Meriam SpraguePemberton, Heather E, MD;  [facility];  Service: Cardiovascular;  Laterality: N/A;  ? CARDIOVERSION N/A 10/31/2020  ? Procedure: CARDIOVERSION;  Surgeon: Thomasene Rippleobb, Kardie, DO;  Location: [facility] ENDOSCOPY;  Service: Cardiovascular;  Laterality: N/A;  ? CATARACT EXTRACTION Bilateral 11/2020  ? CRANIOTOMY Left 12/26/2020  ? Procedure: Left Subocciptal Cranitomy;  Surgeon: Coletta Memosabbell, Kyle, MD;  [facility];  Service: Neurosurgery;  Laterality: Left;  ? IR ANGIO INTRA EXTRACRAN SEL COM CAROTID INNOMINATE BILAT MOD SED  10/04/2020  ? IR ANGIO VERTEBRAL SEL SUBCLAVIAN INNOMINATE BILAT MOD SED   10/03/2020  ? IR THORACENTESIS ASP PLEURAL SPACE W/IMG GUIDE  07/31/2020  ? IR US GUIDE VASC ACCESS RIGHT  10/04/2020  ? TEE WITHOUT CARDIOVERSION N/A 08/02/2020  ? Procedure: TRANSESOPHAGEAL ECHOCARDIOGRAM (TEE)

## 2021-07-13 ENCOUNTER — Telehealth (HOSPITAL_BASED_OUTPATIENT_CLINIC_OR_DEPARTMENT_OTHER): Payer: Self-pay

## 2021-07-13 NOTE — Telephone Encounter (Signed)
RECEIVED DOCUMENTATION THAT ELIQUIS PATIENT ASSISTANCE APPROVED, PT. NOTIFIED VIA PHONE CALL

## 2021-07-20 ENCOUNTER — Emergency Department (HOSPITAL_BASED_OUTPATIENT_CLINIC_OR_DEPARTMENT_OTHER)
Admission: EM | Admit: 2021-07-20 | Discharge: 2021-07-20 | Disposition: A | Discharge status: Home / Self Care | Payer: Medicare Other | Attending: Emergency Medicine | Admitting: Emergency Medicine

## 2021-07-20 ENCOUNTER — Encounter (HOSPITAL_BASED_OUTPATIENT_CLINIC_OR_DEPARTMENT_OTHER): Payer: Self-pay | Admitting: Emergency Medicine

## 2021-07-20 ENCOUNTER — Emergency Department (HOSPITAL_BASED_OUTPATIENT_CLINIC_OR_DEPARTMENT_OTHER): Payer: Medicare Other

## 2021-07-20 ENCOUNTER — Other Ambulatory Visit: Payer: Self-pay

## 2021-07-20 DIAGNOSIS — Z79899 Other long term (current) drug therapy: Secondary | ICD-10-CM | POA: Diagnosis not present

## 2021-07-20 DIAGNOSIS — I4891 Unspecified atrial fibrillation: Secondary | ICD-10-CM | POA: Diagnosis not present

## 2021-07-20 DIAGNOSIS — R42 Dizziness and giddiness: Secondary | ICD-10-CM | POA: Diagnosis present

## 2021-07-20 DIAGNOSIS — Z7902 Long term (current) use of antithrombotics/antiplatelets: Secondary | ICD-10-CM | POA: Diagnosis not present

## 2021-07-20 LAB — CBC WITH DIFFERENTIAL/PLATELET
Abs Immature Granulocytes: 0.02 10*3/uL (ref 0.00–0.07)
Basophils Absolute: 0 10*3/uL (ref 0.0–0.1)
Basophils Relative: 1 %
Eosinophils Absolute: 0.3 10*3/uL (ref 0.0–0.5)
Eosinophils Relative: 7 %
HCT: 40.9 % (ref 39.0–52.0)
Hemoglobin: 13.8 g/dL (ref 13.0–17.0)
Immature Granulocytes: 0 %
Lymphocytes Relative: 18 %
Lymphs Abs: 0.9 10*3/uL (ref 0.7–4.0)
MCH: 29.7 pg (ref 26.0–34.0)
MCHC: 33.7 g/dL (ref 30.0–36.0)
MCV: 88.1 fL (ref 80.0–100.0)
Monocytes Absolute: 0.7 10*3/uL (ref 0.1–1.0)
Monocytes Relative: 15 %
Neutro Abs: 2.9 10*3/uL (ref 1.7–7.7)
Neutrophils Relative %: 59 %
Platelets: 212 10*3/uL (ref 150–400)
RBC: 4.64 MIL/uL (ref 4.22–5.81)
RDW: 14.4 % (ref 11.5–15.5)
WBC: 5 10*3/uL (ref 4.0–10.5)
nRBC: 0 % (ref 0.0–0.2)

## 2021-07-20 LAB — COMPREHENSIVE METABOLIC PANEL
ALT: 27 U/L (ref 0–44)
AST: 34 U/L (ref 15–41)
Albumin: 4.3 g/dL (ref 3.5–5.0)
Alkaline Phosphatase: 98 U/L (ref 38–126)
Anion gap: 7 (ref 5–15)
BUN: 16 mg/dL (ref 8–23)
CO2: 25 mmol/L (ref 22–32)
Calcium: 9.3 mg/dL (ref 8.9–10.3)
Chloride: 98 mmol/L (ref 98–111)
Creatinine, Ser: 0.77 mg/dL (ref 0.61–1.24)
GFR, Estimated: >60 mL/min (ref >60)
Glucose, Bld: 95 mg/dL (ref 70–99)
Potassium: 4.6 mmol/L (ref 3.5–5.1)
Sodium: 130 mmol/L — ABNORMAL LOW (ref 135–145)
Total Bilirubin: 0.8 mg/dL (ref 0.3–1.2)
Total Protein: 7.4 g/dL (ref 6.5–8.1)

## 2021-07-20 LAB — MAGNESIUM: Magnesium: 1.9 mg/dL (ref 1.7–2.4)

## 2021-07-20 LAB — TROPONIN I (HIGH SENSITIVITY)
Troponin I (High Sensitivity): 2 ng/L (ref <18)
Troponin I (High Sensitivity): 2 ng/L (ref <18)

## 2021-07-20 NOTE — Discharge Instructions (Signed)
Please follow-up with your cardiologist.  Come back to ER if you have any episodes of passing out, lightheadedness, chest pain, shortness of breath or other new concerning symptom.

## 2021-07-20 NOTE — ED Provider Notes (Signed)
[facility] EMERGENCY DEPARTMENT Provider Note   CSN: [id number] Arrival date & time: 07/20/21  1126     History  Chief Complaint  Patient presents with   Dizziness    Samuel Barnes is a 71 y.o. male.  Presented to ER due to concern for episode of lightheadedness.  Episode occurred this morning, felt somewhat lightheaded, at times felt like he was going to pass out but did not pass out.  Episode lasting a few minutes in total.  Currently symptom-free.  No associated chest pain or difficulty breathing.  He states that he checked his blood pressure when he was feeling lightheaded and it was 110s systolic.  He also checked his heart rate and was in the 70s.  HPI     Home Medications Prior to Admission medications   Medication Sig Start Date End Date Taking? Authorizing Provider  acetaminophen (TYLENOL) 500 MG tablet Take 1,000 mg by mouth every 6 (six) hours as needed (pain.).    [provider]  albuterol (VENTOLIN HFA) 108 (90 Base) MCG/ACT inhaler Inhale 2 puffs into the lungs every 4 (four) hours as needed for wheezing or shortness of breath. 02/20/21   Omar Person, MD  amiodarone (PACERONE) 200 MG tablet Take 1 tablet (200 mg) by mouth twice daily till 08/17/2020 and then take 1 tablet (200 mg) once daily from 08/18/2020 onwards as per cardiology 08/08/20   Glade Lloyd, MD  apixaban (ELIQUIS) 5 MG TABS tablet Take 1 tablet (5 mg total) by mouth 2 (two) times daily. 02/22/21   [name], MD  FLUoxetine (PROZAC) 20 MG capsule Take 20 mg by mouth at bedtime.    [provider]  folic acid (FOLVITE) 1 MG tablet Take 1 tablet (1 mg total) by mouth daily. 08/09/20   Glade Lloyd, MD  furosemide (LASIX) 20 MG tablet Take 1 tablet (20 mg total) by mouth as needed. 02/22/21   [name], MD  ketoconazole (NIZORAL) 2 % cream Apply 1 application topically 2 (two) times daily. GROIN AREA 10/11/20   [provider]  ketorolac (ACULAR) 0.5  % ophthalmic solution  10/24/20   [provider]  losartan (COZAAR) 25 MG tablet Take 1 tablet (25 mg total) by mouth daily. 02/22/21   [name], MD  lovastatin (MEVACOR) 20 MG tablet Take 1 tablet (20 mg total) by mouth daily. 02/22/21   [name], MD  metoprolol succinate (TOPROL XL) 25 MG 24 hr tablet Take 1 tablet (25 mg total) by mouth daily. 02/22/21 05/23/21  [name], MD  montelukast (SINGULAIR) 10 MG tablet Take 10 mg by mouth daily.    [provider]  Multiple Vitamin (MULTIVITAMIN WITH MINERALS) TABS tablet Take 1 tablet by mouth daily. 08/09/20   Glade Lloyd, MD  oxyCODONE (OXY IR/ROXICODONE) 5 MG immediate release tablet Take 5 mg by mouth every 6 (six) hours as needed. 01/09/21   [provider]  polyethylene glycol (MIRALAX / GLYCOLAX) 17 g packet Take 17 g by mouth daily as needed for mild constipation. 08/21/20   Rolly Salter, MD  rivastigmine (EXELON) 3 MG capsule Take 1 capsule (3 mg total) by mouth 2 (two) times daily. 01/29/21 02/28/21  Windell Norfolk, MD  terazosin (HYTRIN) 5 MG capsule Take 5 mg by mouth in the morning.    [provider]  thiamine (VITAMIN B-1) 100 MG tablet Take 100 mg by mouth daily. 01/21/21   [provider]  umeclidinium  bromide (INCRUSE ELLIPTA) 62.5 MCG/ACT AEPB Inhale 1 puff into the lungs daily. 02/20/21   Omar Person, MD      Allergies    Ciprofloxacin, Gabapentin, and Chlorhexidine gluconate    Review of Systems   Review of Systems  Constitutional:  Negative for chills and fever.  HENT:  Negative for ear pain and sore throat.   Eyes:  Negative for pain and visual disturbance.  Respiratory:  Negative for cough and shortness of breath.   Cardiovascular:  Negative for chest pain and palpitations.  Gastrointestinal:  Negative for abdominal pain and vomiting.  Genitourinary:  Negative for dysuria and hematuria.  Musculoskeletal:  Negative for arthralgias and back pain.  Skin:   Negative for color change and rash.  Neurological:  Positive for light-headedness. Negative for seizures and syncope.  All other systems reviewed and are negative.   Physical Exam Updated Vital Signs BP (!) 130/58   Pulse (!) 50   Temp 98.2 F (36.8 C) (Oral)   Resp 15   Ht 5\' 8"  (1.727 m)   Wt 93.9 kg   SpO2 98%   BMI 31.47 kg/m  Physical Exam Vitals and nursing note reviewed.  Constitutional:      General: He is not in acute distress.    Appearance: He is well-developed.  HENT:     Head: Normocephalic and atraumatic.  Eyes:     Conjunctiva/sclera: Conjunctivae normal.  Cardiovascular:     Rate and Rhythm: Normal rate and regular rhythm.     Heart sounds: No murmur heard. Pulmonary:     Effort: Pulmonary effort is normal. No respiratory distress.     Breath sounds: Normal breath sounds.  Abdominal:     Palpations: Abdomen is soft.     Tenderness: There is no abdominal tenderness.  Musculoskeletal:        General: No swelling.     Cervical back: Neck supple.  Skin:    General: Skin is warm and dry.     Capillary Refill: Capillary refill takes less than 2 seconds.  Neurological:     Mental Status: He is alert.  Psychiatric:        Mood and Affect: Mood normal.     ED Results / Procedures / Treatments   Labs (all labs ordered are listed, but only abnormal results are displayed) Labs Reviewed  COMPREHENSIVE METABOLIC PANEL - Abnormal; Notable for the following components:      Result Value   Sodium 130 (*)    All other components within normal limits  CBC WITH DIFFERENTIAL/PLATELET  MAGNESIUM  TROPONIN I (HIGH SENSITIVITY)  TROPONIN I (HIGH SENSITIVITY)    EKG EKG Interpretation  Date/Time:  Friday July 20 2021 13:02:58 EDT Ventricular Rate:  51 PR Interval:  176 QRS Duration: 96 QT Interval:  515 QTC Calculation: 475 R Axis:   -27 Text Interpretation: Sinus rhythm Borderline left axis deviation Anteroseptal infarct, old Confirmed by [id number] (Marianna Fuss) on 07/20/2021 3:36:54 PM  Radiology DG Chest 2 View  Result Date: 07/20/2021 CLINICAL DATA:  Dizziness, atrial fibrillation EXAM: CHEST - 2 VIEW COMPARISON:  04/27/2021 FINDINGS: Enlargement of cardiac silhouette. Mediastinal contours and pulmonary vascularity normal. Atherosclerotic calcification aorta. RIGHT basilar scarring. Lungs otherwise clear. No acute infiltrate, pleural effusion, or pneumothorax. Multiple old RIGHT rib fractures again identified. IMPRESSION: Enlargement of cardiac silhouette with RIGHT basilar scarring. No acute abnormalities. Electronically Signed   By: 04/29/2021 M.D.   On: 07/20/2021 12:10    Procedures Procedures  Medications Ordered in ED Medications - No data to display  ED Course/ Medical Decision Making/ A&P                           Medical Decision Making Amount and/or Complexity of Data Reviewed Labs: ordered. Radiology: ordered.   71 year old gentleman presenting to ER due to concern for episode of lightheadedness.  Here patient is well-appearing in no distress with grossly stable vital signs.  He denies any ongoing symptoms at this time.  He has no anemia or electrolyte derangement.  Except noted mild hyponatremia.  Has history of prior.  EKG shows sinus rhythm.  On review of cardiac monitor he remains in sinus rhythm.  CXR is grossly negative.  Reviewed last echocardiogram, normal EF.  Has been seen by cardiology previously.  Given patient has no ongoing symptoms and his work-up thus far has been reassuring and patient did not have full syncopal event, feel he is appropriate for discharge and outpatient management at this time.  Encourage patient to follow-up with his cardiologist and primary care doctor.  Reviewed return precautions in detail with patient and family at bedside, discharged.  After the discussed management above, the patient was determined to be safe for discharge.  The patient was in agreement with this plan and all  questions regarding their care were answered.  ED return precautions were discussed and the patient will return to the ED with any significant worsening of condition.         Final Clinical Impression(s) / ED Diagnoses Final diagnoses:  Atrial fibrillation, unspecified type [facility])    Rx / DC Orders ED Discharge Orders     None         Milagros Loll, MD 07/20/21 1538

## 2021-07-20 NOTE — ED Triage Notes (Signed)
Pt arrives pov, c/o dizziness and "afib" that onset x 2 hrs pta. Pt denies HA, denies CP. Sinus brady on monitor. AO x 4, VAN neg

## 2021-07-22 NOTE — Progress Notes (Unsigned)
Office Visit    Patient Name: Samuel Barnes Date of Encounter: 07/23/2021  PCP:  [name], PA   [facility]  Cardiologist:  Armanda Magic, MD  Advanced Practice Provider:  No care team member to display Electrophysiologist:  None    Chief Complaint    Samuel Barnes is a 71 y.o. male with a hx of CHF, chronic hypoxic respiratory failure on 3L O2, atrial fibrillation on Eliquis, BPH, CAD, HLD, tobacco use, obesity, depression, alcohol abuse presents today for follow up  Past Medical History    Past Medical History:  Diagnosis Date   Anxiety    Aortic atherosclerosis (HCC) 07/28/2020   Atrial fibrillation (HCC)    BPH (benign prostatic hyperplasia)    CHF (congestive heart failure) (HCC)    Class 2 obesity 07/28/2020   COPD (chronic obstructive pulmonary disease) (HCC)    Coronary artery calcification 07/28/2020   Dementia (HCC)    Depression    Family history of adverse reaction to anesthesia    Hyperlipidemia 07/28/2020   Hypertension    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   Tobacco use 07/28/2020   Past Surgical History:  Procedure Laterality Date   APPLICATION OF CRANIAL NAVIGATION N/A 12/26/2020   Procedure: APPLICATION OF CRANIAL NAVIGATION;  Surgeon: [name], MD;  Location: [facility] OR;  Service: Neurosurgery;  Laterality: N/A;  RM 21   CARDIOVERSION N/A 08/02/2020   Procedure: CARDIOVERSION;  Surgeon: Meriam Sprague, MD;  Location: [facility] ENDOSCOPY;  Service: Cardiovascular;  Laterality: N/A;   CARDIOVERSION N/A 10/31/2020   Procedure: CARDIOVERSION;  Surgeon: Thomasene Ripple, DO;  Location: [facility] ENDOSCOPY;  Service: Cardiovascular;  Laterality: N/A;   CATARACT EXTRACTION Bilateral 11/2020   CRANIOTOMY Left 12/26/2020   Procedure: Left Subocciptal Cranitomy;  Surgeon: [name], MD;  Location: [facility] OR;  Service: Neurosurgery;  Laterality: Left;   IR ANGIO  INTRA EXTRACRAN SEL COM CAROTID INNOMINATE BILAT MOD SED  10/04/2020   IR ANGIO VERTEBRAL SEL SUBCLAVIAN INNOMINATE BILAT MOD SED  10/03/2020   IR THORACENTESIS ASP PLEURAL SPACE W/IMG GUIDE  07/31/2020   IR US GUIDE VASC ACCESS RIGHT  10/04/2020   TEE WITHOUT CARDIOVERSION N/A 08/02/2020   Procedure: TRANSESOPHAGEAL ECHOCARDIOGRAM (TEE);  Surgeon: Meriam Sprague, MD;  Location: [facility] ENDOSCOPY;  Service: Cardiovascular;  Laterality: N/A;   TRANSURETHRAL RESECTION OF PROSTATE      Allergies  Allergies  Allergen Reactions   Ciprofloxacin Shortness Of Breath and Other (See Comments)    Numbness in extremities    Gabapentin Other (See Comments)    Mental problems, depression, anger   Chlorhexidine Gluconate Other (See Comments)    Burning and itching    History of Present Illness    Samuel Barnes is a 71 y.o. male with a hx of CHF, chronic hypoxic respiratory failure on 3L O2, atrial fibrillation on Eliquis, BPH, CAD, HLD, tobacco use, obesity, depression, alcohol abuse last seen 10/18/20  Admitted 07/28/2020 - 08/08/2020 with acute on chronic diastolic heart failure, paroxysmal new onset atrial fibrillation, and pleural effusion.  No prior cardiac imaging or ischemic testing coronary artery calcifications on previous CT scan from 2019.  Underwent right thoracentesis centesis.TEE 08/02/20 LVEF 55-60%, no left atrial appendage thrombus. DCCV subsequently performed. Only brief restoration of NSR. He was diuresed with IV Lasix 9 litersand discharged on p.o. Lasix.  He was admitted 08/14/20-08/21/20 with Wernicke's encephalopathy, syncope, metabolic encephalopathy. CT/MRI brain  unremarkable. Family noted cognitive decline for past few weeks.  08/30/20 labs via Care Everywhere: NA 133, AST 38, ALT 78, alkaline phosphatase 158, creatinine 0.69, GFR greater than 90 Hemoglobin 15.4, hematocrit 46.7, platelets 314 B12 486, PSA 2.3, Hemoglobin A1c 5.9  Admitted 09/27/20-10/04/20 after near  syncope and fall. He was diagnosed with ICH. CT head with 99mm acute parenchymal bleed with surrounding edema within left ocipital lobe. 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  Underwent angiography showing "a prominently dilated, early draining vein in the left occipital region, draining into the internal cerebral vein, consistent with shunting.  No clear liters seen.  The pain is flow is anterograde." He was cleared to resume Eliquis on discharge. Given bradycardia, Toprol further reduced to 25mg  QD. Low dose Amlodipine was added for blood pressure control.   Seen in follow-up 10/18/2020.  He was still in atrial fibrillation and scheduled for cardioversion which was performed 10/31/2020 but was unsuccessful.   Seen by Laurann Montana, NP on 11/14/2020 and was back in normal sinus rhythm.  No changes were made at that time  He is here today for followup.  He tells me that on Friday he was bending over getting clothes out of the dryer and he went and sat down but took a while to go away.  His legs felt weak as well and felt wobbly.  He denies any chest pain or pressure,  PND, orthopnea, LE edema, palpitations or syncope. He has some mild DOE when walking out to get his newspaper which is chronic and unchanged. He  is compliant with his meds and is tolerating meds with no SE.     EKGs/Labs/Other Studies Reviewed:   The following studies were reviewed today:   ECHO:  TEE 08/02/20   1. Left ventricular ejection fraction, by estimation, is 55 to 60%. The  left ventricle has normal function.   2. Right ventricular systolic function is normal. The right ventricular  size is normal.   3. Left atrial size was mild to moderately dilated. No left atrial/left  atrial appendage thrombus was detected.   4. Right atrial size was moderately dilated.   5. The mitral valve is normal in structure. Mild mitral valve  regurgitation. No evidence of  mitral stenosis.   6. The aortic valve is tricuspid. There is mild calcification of the  aortic valve. There is mild thickening of the aortic valve. Aortic valve  regurgitation is not visualized. Mild aortic valve sclerosis is present,  with no evidence of aortic valve  stenosis.   7. There is Moderate (Grade III) plaque.   8. Successful DCCV performed (200J x1) after TEE with return to NSR.  Please see separate note.   EKG: EKG is not performed today   Recent Labs: 08/14/2020: B Natriuretic Peptide 135.2 01/30/2021: TSH 3.833 07/20/2021: ALT 27; BUN 16; Creatinine, Ser 0.77; Hemoglobin 13.8; Magnesium 1.9; Platelets 212; Potassium 4.6; Sodium 130  Recent Lipid Panel    Component Value Date/Time   CHOL 73 07/30/2020 0555   TRIG 29 07/30/2020 0555   HDL 34 (L) 07/30/2020 0555   CHOLHDL 2.1 07/30/2020 0555   VLDL 6 07/30/2020 0555   LDLCALC 33 07/30/2020 0555    Home Medications   Current Meds  Medication Sig   acetaminophen (TYLENOL) 500 MG tablet Take 1,000 mg by mouth every 6 (six) hours as needed (pain.).   albuterol (VENTOLIN HFA) 108 (90 Base) MCG/ACT inhaler  Inhale 2 puffs into the lungs every 4 (four) hours as needed for wheezing or shortness of breath.   amiodarone (PACERONE) 200 MG tablet Take 1 tablet (200 mg) by mouth twice daily till 08/17/2020 and then take 1 tablet (200 mg) once daily from 08/18/2020 onwards as per cardiology   apixaban (ELIQUIS) 5 MG TABS tablet Take 1 tablet (5 mg total) by mouth 2 (two) times daily.   folic acid (FOLVITE) 1 MG tablet Take 1 tablet (1 mg total) by mouth daily.   furosemide (LASIX) 20 MG tablet Take 1 tablet (20 mg total) by mouth as needed.   ketoconazole (NIZORAL) 2 % cream Apply 1 application topically 2 (two) times daily. GROIN AREA   ketorolac (ACULAR) 0.5 % ophthalmic solution    losartan (COZAAR) 25 MG tablet Take 1 tablet (25 mg total) by mouth daily.   lovastatin (MEVACOR) 20 MG tablet Take 1 tablet (20 mg total) by mouth  daily.   montelukast (SINGULAIR) 10 MG tablet Take 10 mg by mouth daily.   Multiple Vitamin (MULTIVITAMIN WITH MINERALS) TABS tablet Take 1 tablet by mouth daily.   oxyCODONE (OXY IR/ROXICODONE) 5 MG immediate release tablet Take 5 mg by mouth every 6 (six) hours as needed.   polyethylene glycol (MIRALAX / GLYCOLAX) 17 g packet Take 17 g by mouth daily as needed for mild constipation.   terazosin (HYTRIN) 5 MG capsule Take 5 mg by mouth in the morning.   thiamine (VITAMIN B-1) 100 MG tablet Take 100 mg by mouth daily.   umeclidinium bromide (INCRUSE ELLIPTA) 62.5 MCG/ACT AEPB Inhale 1 puff into the lungs daily.   [DISCONTINUED] FLUoxetine (PROZAC) 20 MG capsule Take 20 mg by mouth at bedtime.     Review of Systems      All other systems reviewed and are otherwise negative except as noted above.  Physical Exam    VS:  BP 112/62   Pulse (!) 57   Ht 5\' 8"  (1.727 m)   Wt 215 lb 6.4 oz (97.7 kg)   SpO2 96%   BMI 32.75 kg/m  , BMI Body mass index is 32.75 kg/m.  Wt Readings from Last 3 Encounters:  07/23/21 215 lb 6.4 oz (97.7 kg)  07/20/21 207 lb (93.9 kg)  04/25/21 212 lb (96.2 kg)    GEN: Well nourished, well developed in no acute distress HEENT: Normal NECK: No JVD;bilateral carotid dopplers LYMPHATICS: No lymphadenopathy CARDIAC:RRR, no murmurs, rubs, gallops RESPIRATORY:  Clear to auscultation without rales, wheezing or rhonchi  ABDOMEN: Soft, non-tender, non-distended MUSCULOSKELETAL:  No edema; No deformity  SKIN: Warm and dry NEUROLOGIC:  Alert and oriented x 3 PSYCHIATRIC:  Normal affect   Assessment & Plan     PAF / Chronic anticoagulation  - Unsuccessful cardioversion 11/01/20.  -Has since self converted likely 11/10/20 based on home HR readings.  -CHA2DS2-VASc Score = 4 [CHF History: 1, HTN History: 1, Diabetes History: 0, Stroke History: 0, Vascular Disease History: 1, Age Score: 1, Gender Score: 0].  Therefore, the patient's annual risk of stroke is 4.8 %.      -He continues to maintain NSR on exam and denies any palpitations -continue prescription drug management with Toprol XL 25mg  daily, Amio 200mg  daily and Eliquis 5mg  BID with PRN refills -I have personally reviewed and interpreted outside labs performed by patient's PCP which showed serum creatinine 0.7 potassium 4.6, ALT 27, hemoglobin 13.8 on 07/20/2021 -TSH was 3.8 on 01/30/2021 -He is getting yearly eye exams -Repeat PFTs in  September 2023   HTN  -BP is adequately controlled on exam today but says that his BPs at times have been high and yet he had an episode of dizziness after bending over -continue prescription drug management with Toprol XL 25mg  daily and Losartan 25mg  daily with PRN refills -I am going to get a 48-hour blood pressure monitor  HLD -LDL goal <70  -Check FLP and ALT -continue prescription drug management with Lovastatin 20mg  daily with PRN refills   HFpEF  -Echo 07/2020 normal LVEF, gr2DD.   -he appears euvolemic on exam today -Low salt diet and fluid <2L restriction encouraged.  -continue prescription drug management with Lasix 20mg  daily with PRN refills  Coronary artery calcification on CT  - Stable with no anginal symptoms.  -No indication for ischemic evaluation.  -continue BB and statin -No aspirin due to chronic anticoagulation.   Carotid artery stenosis -dopplers 8/22 showed 1-39% bilateral stenosis -bilateral carotid bruits -repeat dopplers -continue statin -no ASA due to DOAC  Dizziness -This occurred after bending over to get close out of the dryer>> suspect he just stood up too fast -Orthostatic blood pressures on exam today are normal -Encouraged to not bend over for long periods of time and after he has been sitting for long period of time stand up slowly -Repeating carotid Dopplers    Disposition: Follow up in 1 year  Signed, , MD 07/23/2021, 9:55 AM [facility]

## 2021-07-23 ENCOUNTER — Encounter: Payer: Self-pay | Admitting: Cardiology

## 2021-07-23 ENCOUNTER — Ambulatory Visit: Payer: Medicare Other | Admitting: Cardiology

## 2021-07-23 VITALS — BP 112/62 | HR 57 | Ht 68.0 in | Wt 215.4 lb

## 2021-07-23 DIAGNOSIS — E7800 Pure hypercholesterolemia, unspecified: Secondary | ICD-10-CM

## 2021-07-23 DIAGNOSIS — I10 Essential (primary) hypertension: Secondary | ICD-10-CM

## 2021-07-23 DIAGNOSIS — I2510 Atherosclerotic heart disease of native coronary artery without angina pectoris: Secondary | ICD-10-CM

## 2021-07-23 DIAGNOSIS — I480 Paroxysmal atrial fibrillation: Secondary | ICD-10-CM | POA: Diagnosis not present

## 2021-07-23 DIAGNOSIS — I2584 Coronary atherosclerosis due to calcified coronary lesion: Secondary | ICD-10-CM

## 2021-07-23 DIAGNOSIS — I5030 Unspecified diastolic (congestive) heart failure: Secondary | ICD-10-CM | POA: Diagnosis not present

## 2021-07-23 DIAGNOSIS — I6523 Occlusion and stenosis of bilateral carotid arteries: Secondary | ICD-10-CM

## 2021-07-23 NOTE — Addendum Note (Signed)
Addended by: Theresia Majors on: 07/23/2021 10:25 AM   Modules accepted: Orders

## 2021-07-23 NOTE — Patient Instructions (Addendum)
Medication Instructions:  Your physician recommends that you continue on your current medications as directed. Please refer to the Current Medication list given to you today.  *If you need a refill on your cardiac medications before your next appointment, please call your pharmacy*  Lab Work: Come back fasting for Lipids and ALT If you have labs (blood work) drawn today and your tests are completely normal, you will receive your results only by: MyChart Message (if you have MyChart) OR A paper copy in the mail If you have any lab test that is abnormal or we need to change your treatment, we will call you to review the results.  Testing/Procedures: Your physician has recommended that you have a pulmonary function test in September 2023. Pulmonary Function Tests are a group of tests that measure how well air moves in and out of your lungs.  Your physician has requested that you have a carotid duplex. This test is an ultrasound of the carotid arteries in your neck. It looks at blood flow through these arteries that supply the brain with blood. Allow one hour for this exam. There are no restrictions or special instructions.  Your physician has requested that you wear a Blood Pressure monitor.   Follow-Up: At [facility], you and your health needs are our priority.  As part of our continuing mission to provide you with exceptional heart care, we have created designated Provider Care Teams.  These Care Teams include your primary Cardiologist (physician) and Advanced Practice Providers (APPs -  Physician Assistants and Nurse Practitioners) who all work together to provide you with the care you need, when you need it.  Your next appointment:   1 year(s)  The format for your next appointment:   In Person  Provider:   Armanda Magic, MD     Important Information About Sugar

## 2021-07-25 ENCOUNTER — Other Ambulatory Visit (HOSPITAL_COMMUNITY): Payer: Self-pay

## 2021-07-25 ENCOUNTER — Telehealth: Payer: Self-pay | Admitting: Student

## 2021-07-25 NOTE — Telephone Encounter (Signed)
Hoy Finlay, [employer]  You 14 minutes ago (10:35 AM)    Good morning! The only other LAMA(s) that are covered are as follows:  Spiriva Respimat: $136.96  Spiriva handihaler: $136.96  The patient is in the donut hole with insurance plan.     Called and spoke with Lorene Dy letting her know that any inhaler we switched pt to would be more expensive due to him being in the donut hole and she verbalized understanding. Stated to her that they could try to see if he could be approved pt assistance and she verbalized understanding. Provided her with the website for [employer] pt assistance as well as [employer]'s phone number if she wanted to call to get the process started over the phone. Nothing further needed.

## 2021-07-25 NOTE — Telephone Encounter (Signed)
Called and spoke with pt's daughter Lorene Dy who states that pt is currently in the donut hole and due to this, the cost for his incruse is around $100 which Lorene Dy states pt is having a hard time trying to pay.  They are wanting to know if there is anything that might be a little more affordable for pt or what options they may have.  Routing this to both Dr. Thora Lance as well as prior auth team for advice.

## 2021-08-09 ENCOUNTER — Ambulatory Visit (HOSPITAL_COMMUNITY)
Admission: RE | Admit: 2021-08-09 | Payer: Medicare Other | Source: Ambulatory Visit | Attending: Cardiology | Admitting: Cardiology

## 2021-08-16 ENCOUNTER — Telehealth: Payer: Self-pay | Admitting: *Deleted

## 2021-08-16 NOTE — Telephone Encounter (Signed)
Contacted patient to schedule his 24 hour ambulatory blood pressure monitor. Patient declined test.  He stated he used to take his blood pressure first thing in the morning.  He is now taking it 3 hours after taking his blood pressure medication and his pressures have been fine. Patient also stated he currently has Covid, so he could not do test even if he wanted to, (which, he doesn't). Order will be cancelled and message forwarded to Dr. Malachy Mood nurse.

## 2021-08-19 ENCOUNTER — Other Ambulatory Visit: Payer: Self-pay | Admitting: Neurology

## 2021-08-20 ENCOUNTER — Ambulatory Visit (HOSPITAL_COMMUNITY): Payer: Medicare Other

## 2021-08-20 NOTE — Telephone Encounter (Signed)
Spoke with the patient who reports that he has been keeping up with his blood pressures at home. He takes his BP daily two hours after taking his medications. He reports the following recent readings: 128/68 122/63 140/70 122/64 140/70 132/77 130/67 139/74

## 2021-08-27 NOTE — Telephone Encounter (Signed)
Rx refilled.

## 2021-08-31 ENCOUNTER — Other Ambulatory Visit: Payer: Self-pay | Admitting: *Deleted

## 2021-08-31 DIAGNOSIS — Z87891 Personal history of nicotine dependence: Secondary | ICD-10-CM

## 2021-08-31 DIAGNOSIS — Z122 Encounter for screening for malignant neoplasm of respiratory organs: Secondary | ICD-10-CM

## 2021-09-04 ENCOUNTER — Other Ambulatory Visit: Payer: Self-pay

## 2021-09-04 ENCOUNTER — Ambulatory Visit (HOSPITAL_COMMUNITY)
Admission: RE | Admit: 2021-09-04 | Discharge: 2021-09-04 | Disposition: A | Discharge status: Home / Self Care | Payer: Medicare Other | Source: Ambulatory Visit | Attending: Cardiovascular Disease | Admitting: Cardiovascular Disease

## 2021-09-04 ENCOUNTER — Telehealth: Payer: Self-pay | Admitting: Student

## 2021-09-04 DIAGNOSIS — I2510 Atherosclerotic heart disease of native coronary artery without angina pectoris: Secondary | ICD-10-CM | POA: Insufficient documentation

## 2021-09-04 DIAGNOSIS — I480 Paroxysmal atrial fibrillation: Secondary | ICD-10-CM

## 2021-09-04 DIAGNOSIS — Z122 Encounter for screening for malignant neoplasm of respiratory organs: Secondary | ICD-10-CM

## 2021-09-04 DIAGNOSIS — E7800 Pure hypercholesterolemia, unspecified: Secondary | ICD-10-CM

## 2021-09-04 DIAGNOSIS — I5030 Unspecified diastolic (congestive) heart failure: Secondary | ICD-10-CM | POA: Insufficient documentation

## 2021-09-04 DIAGNOSIS — Z87891 Personal history of nicotine dependence: Secondary | ICD-10-CM

## 2021-09-04 DIAGNOSIS — I6523 Occlusion and stenosis of bilateral carotid arteries: Secondary | ICD-10-CM | POA: Diagnosis not present

## 2021-09-04 DIAGNOSIS — I2584 Coronary atherosclerosis due to calcified coronary lesion: Secondary | ICD-10-CM | POA: Insufficient documentation

## 2021-09-04 DIAGNOSIS — I10 Essential (primary) hypertension: Secondary | ICD-10-CM | POA: Diagnosis present

## 2021-09-04 LAB — LIPID PANEL
Chol/HDL Ratio: 2.9 ratio (ref 0.0–5.0)
Cholesterol, Total: 102 mg/dL (ref 100–199)
HDL: 35 mg/dL — ABNORMAL LOW (ref >39)
LDL Chol Calc (NIH): 53 mg/dL (ref 0–99)
Triglycerides: 61 mg/dL (ref 0–149)
VLDL Cholesterol Cal: 14 mg/dL (ref 5–40)

## 2021-09-04 LAB — ALT: ALT: 19 IU/L (ref 0–44)

## 2021-09-06 ENCOUNTER — Encounter: Payer: Self-pay | Admitting: Cardiology

## 2021-09-06 DIAGNOSIS — I6523 Occlusion and stenosis of bilateral carotid arteries: Secondary | ICD-10-CM | POA: Insufficient documentation

## 2021-09-07 NOTE — Telephone Encounter (Signed)
Routing to [employer] for her to follow up on.

## 2021-09-07 NOTE — Telephone Encounter (Signed)
Forms faxed to [employer] and placed in scan folder

## 2021-09-25 ENCOUNTER — Ambulatory Visit (INDEPENDENT_AMBULATORY_CARE_PROVIDER_SITE_OTHER): Payer: Medicare Other | Admitting: Acute Care

## 2021-09-25 ENCOUNTER — Encounter: Payer: Self-pay | Admitting: Acute Care

## 2021-09-25 DIAGNOSIS — Z87891 Personal history of nicotine dependence: Secondary | ICD-10-CM

## 2021-09-25 NOTE — Progress Notes (Addendum)
Virtual Visit via Telephone Note  I connected with Samuel Barnes on 11/21/20 at  2:00 PM EST by telephone and verified that I am speaking with the correct person using two identifiers.  Location: Patient: Home  Provider: Working from home    I discussed the limitations, risks, security and privacy concerns of performing an evaluation and management service by telephone and the availability of in person appointments. I also discussed with the patient that there may be a patient responsible charge related to this service. The patient expressed understanding and agreed to proceed.  Shared Decision Making Visit Lung Cancer Screening Program [phone])   Eligibility: Age 71 y.o. Pack Years Smoking History Calculation 75 (# packs/per year x # years smoked) Recent History of coughing up blood  no Unexplained weight loss? no ( >Than 15 pounds within the last 6 months ) Prior History Lung / other cancer no (Diagnosis within the last 5 years already requiring surveillance chest CT Scans). Smoking Status Former Smoker Former Smokers: Years since quit: 1 year  Quit Date: 2022  Visit Components: Discussion included one or more decision making aids. yes Discussion included risk/benefits of screening. yes Discussion included potential follow up diagnostic testing for abnormal scans. yes Discussion included meaning and risk of over diagnosis. yes Discussion included meaning and risk of False Positives. yes Discussion included meaning of total radiation exposure. yes  Counseling Included: Importance of adherence to annual lung cancer LDCT screening. yes Impact of comorbidities on ability to participate in the program. yes Ability and willingness to under diagnostic treatment. yes  Smoking Cessation Counseling: Current Smokers:  Discussed importance of smoking cessation. yes Information about tobacco cessation classes and interventions provided to patient. yes Patient provided with  "ticket" for LDCT Scan. yes Symptomatic Patient. no  Counseling NA Diagnosis Code: Tobacco Use Z72.0 Asymptomatic Patient yes  Counseling NA Former Smokers:  Discussed the importance of maintaining cigarette abstinence. yes Diagnosis Code: Personal History of Nicotine Dependence. Q22.979 Information about tobacco cessation classes and interventions provided to patient. Yes Patient provided with "ticket" for LDCT Scan. yes Written Order for Lung Cancer Screening with LDCT placed in Epic. Yes (CT Chest Lung Cancer Screening Low Dose W/O CM) [id number] Z12.2-Screening of respiratory organs Z87.891-Personal history of nicotine dependence   I spent 25 minutes of face to face time with him discussing the risks and benefits of lung cancer screening. We viewed a power point together that explained in detail the above noted topics. We took the time to pause the power point at intervals to allow for questions to be asked and answered to ensure understanding. We discussed that he had taken the single most powerful action possible to decrease his risk of developing lung cancer when he quit smoking. I counseled him to remain smoke free, and to contact me if he ever had the desire to smoke again so that I can provide resources and tools to help support the effort to remain smoke free. We discussed the time and location of the scan, and that either  Abigail Miyamoto RN or I will call with the results within  24-48 hours of receiving them. He has my card and contact information in the event he needs to speak with me, in addition to a copy of the power point we reviewed as a resource. He verbalized understanding of all of the above and had no further questions upon leaving the office.     I explained to the patient that there has been a high  incidence of coronary artery disease noted on these exams. I explained that this is a non-gated exam therefore degree or severity cannot be determined. This patient is not on  statin therapy. I have asked the patient to follow-up with their PCP regarding any incidental finding of coronary artery disease and management with diet or medication as they feel is clinically indicated. The patient verbalized understanding of the above and had no further questions.    Malak Duchesneau D. Kenton Kingfisher, NP-C [facility] Personal contact information can be found on Amion  09/25/2021, 9:37 AM

## 2021-09-25 NOTE — Patient Instructions (Signed)
Thank you for participating in the [facility]. It was our pleasure to meet you today. We will call you with the results of your scan within the next few days. Your scan will be assigned a Lung RADS category score by the physicians reading the scans.  This Lung RADS score determines follow up scanning.  See below for description of categories, and follow up screening recommendations. We will be in touch to schedule your follow up screening annually or based on recommendations of our providers. We will fax a copy of your scan results to your Primary Care Physician, or the physician who referred you to the program, to ensure they have the results. Please call the office if you have any questions or concerns regarding your scanning experience or results.  Our office number is [phone]. Please speak with Denise Phelps, RN. , or  Denise Buckner RN, They are  our Lung Cancer Screening RN.'s If They are unavailable when you call, Please leave a message on the voice mail. We will return your call at our earliest convenience.This voice mail is monitored several times a day.  Remember, if your scan is normal, we will scan you annually as long as you continue to meet the criteria for the program. (Age 55-77, Current smoker or smoker who has quit within the last 15 years). If you are a smoker, remember, quitting is the single most powerful action that you can take to decrease your risk of lung cancer and other pulmonary, breathing related problems. We know quitting is hard, and we are here to help.  Please let us know if there is anything we can do to help you meet your goal of quitting. If you are a former smoker, congratulations. We are proud of you! Remain smoke free! Remember you can refer friends or family members through the number above.  We will screen them to make sure they meet criteria for the program. Thank you for helping us take better care of you by  participating in Lung Screening.  You can receive free nicotine replacement therapy ( patches, gum or mints) by calling 1-800-QUIT NOW. Please call so we can get you on the path to becoming  a non-smoker. I know it is hard, but you can do this!  Lung RADS Categories:  Lung RADS 1: no nodules or definitely non-concerning nodules.  Recommendation is for a repeat annual scan in 12 months.  Lung RADS 2:  nodules that are non-concerning in appearance and behavior with a very low likelihood of becoming an active cancer. Recommendation is for a repeat annual scan in 12 months.  Lung RADS 3: nodules that are probably non-concerning , includes nodules with a low likelihood of becoming an active cancer.  Recommendation is for a 6-month repeat screening scan. Often noted after an upper respiratory illness. We will be in touch to make sure you have no questions, and to schedule your 6-month scan.  Lung RADS 4 A: nodules with concerning findings, recommendation is most often for a follow up scan in 3 months or additional testing based on our provider's assessment of the scan. We will be in touch to make sure you have no questions and to schedule the recommended 3 month follow up scan.  Lung RADS 4 B:  indicates findings that are concerning. We will be in touch with you to schedule additional diagnostic testing based on our provider's  assessment of the scan.  Other options for assistance in smoking cessation (   As covered by your insurance benefits)  Hypnosis for smoking cessation  [employer] [phone]  Acupuncture for smoking cessation  [facility] [phone]   

## 2021-09-27 ENCOUNTER — Ambulatory Visit (HOSPITAL_BASED_OUTPATIENT_CLINIC_OR_DEPARTMENT_OTHER)
Admission: RE | Admit: 2021-09-27 | Discharge: 2021-09-27 | Disposition: A | Discharge status: Home / Self Care | Payer: Medicare Other | Source: Ambulatory Visit | Attending: Acute Care | Admitting: Acute Care

## 2021-09-27 DIAGNOSIS — Z87891 Personal history of nicotine dependence: Secondary | ICD-10-CM | POA: Insufficient documentation

## 2021-09-27 DIAGNOSIS — Z122 Encounter for screening for malignant neoplasm of respiratory organs: Secondary | ICD-10-CM | POA: Diagnosis present

## 2021-09-28 ENCOUNTER — Other Ambulatory Visit: Payer: Self-pay

## 2021-09-28 ENCOUNTER — Telehealth: Payer: Self-pay | Admitting: Student

## 2021-09-28 DIAGNOSIS — Z122 Encounter for screening for malignant neoplasm of respiratory organs: Secondary | ICD-10-CM

## 2021-09-28 DIAGNOSIS — Z87891 Personal history of nicotine dependence: Secondary | ICD-10-CM

## 2021-09-28 MED ORDER — INCRUSE ELLIPTA 62.5 MCG/ACT IN AEPB: 1.0000 | INHALATION_SPRAY | Freq: Every day | RESPIRATORY_TRACT | 11 refills | Status: DC

## 2021-09-28 NOTE — Telephone Encounter (Signed)
Printed Incruse RX and placed in Dr. Glenetta Hew sign folder. Updated pt's daughter. Routing to Pepper Pike for f/u

## 2021-10-03 ENCOUNTER — Other Ambulatory Visit (HOSPITAL_COMMUNITY): Payer: Self-pay | Admitting: Cardiology

## 2021-10-03 DIAGNOSIS — I779 Disorder of arteries and arterioles, unspecified: Secondary | ICD-10-CM

## 2021-10-05 ENCOUNTER — Ambulatory Visit (INDEPENDENT_AMBULATORY_CARE_PROVIDER_SITE_OTHER): Payer: Medicare Other | Admitting: Student

## 2021-10-05 DIAGNOSIS — I5030 Unspecified diastolic (congestive) heart failure: Secondary | ICD-10-CM

## 2021-10-05 DIAGNOSIS — I480 Paroxysmal atrial fibrillation: Secondary | ICD-10-CM | POA: Diagnosis not present

## 2021-10-05 DIAGNOSIS — I10 Essential (primary) hypertension: Secondary | ICD-10-CM

## 2021-10-05 DIAGNOSIS — I2584 Coronary atherosclerosis due to calcified coronary lesion: Secondary | ICD-10-CM

## 2021-10-05 DIAGNOSIS — E7800 Pure hypercholesterolemia, unspecified: Secondary | ICD-10-CM | POA: Diagnosis not present

## 2021-10-05 DIAGNOSIS — I2510 Atherosclerotic heart disease of native coronary artery without angina pectoris: Secondary | ICD-10-CM

## 2021-10-05 DIAGNOSIS — I6523 Occlusion and stenosis of bilateral carotid arteries: Secondary | ICD-10-CM

## 2021-10-05 LAB — PULMONARY FUNCTION TEST
DL/VA % pred: 80 %
DL/VA: 3.23 ml/min/mmHg/L
DLCO cor % pred: 72 %
DLCO cor: 17.91 ml/min/mmHg
DLCO unc % pred: 72 %
DLCO unc: 17.91 ml/min/mmHg
FEF 25-75 Post: 1.28 L/sec
FEF 25-75 Pre: 0.71 L/sec
FEF2575-%Change-Post: 80 %
FEF2575-%Pred-Post: 56 %
FEF2575-%Pred-Pre: 31 %
FEV1-%Change-Post: 20 %
FEV1-%Pred-Post: 58 %
FEV1-%Pred-Pre: 48 %
FEV1-Post: 1.79 L
FEV1-Pre: 1.48 L
FEV1FVC-%Change-Post: 4 %
FEV1FVC-%Pred-Pre: 78 %
FEV6-%Change-Post: 15 %
FEV6-%Pred-Post: 74 %
FEV6-%Pred-Pre: 64 %
FEV6-Post: 2.94 L
FEV6-Pre: 2.54 L
FEV6FVC-%Change-Post: 0 %
FEV6FVC-%Pred-Post: 104 %
FEV6FVC-%Pred-Pre: 104 %
FVC-%Change-Post: 15 %
FVC-%Pred-Post: 71 %
FVC-%Pred-Pre: 61 %
FVC-Post: 2.99 L
FVC-Pre: 2.58 L
Post FEV1/FVC ratio: 60 %
Post FEV6/FVC ratio: 98 %
Pre FEV1/FVC ratio: 58 %
Pre FEV6/FVC Ratio: 98 %
RV % pred: 231 %
RV: 5.65 L
TLC % pred: 121 %
TLC: 8.3 L

## 2021-10-05 NOTE — Progress Notes (Signed)
Full PFT performed today. °

## 2021-10-05 NOTE — Patient Instructions (Signed)
Full PFT performed today. °

## 2021-10-13 NOTE — Progress Notes (Unsigned)
Synopsis: Referred for follow up of acute on chronic hypoxic respiratory failure by [name], PA  Subjective:   PATIENT ID: Samuel Barnes GENDER: male DOB: July 14, 1950, MRN: [id number]  No chief complaint on file.   71yM with HTN, chart history of COPD but no PFTs - on dulera/albuterol since hospitalization, smoking 50+ pack-year, recently discovered AF, diastolic dysfunction who is seen in follow up after recent hospitalization for acute on chronic diastolic heart failure with new onset AF s/p TEE/DCCV and initiation of amiodarone/DOAC, found to also have pleural effusion during hospitalization. Diuresed about 20 lb!  Had thoracentesis 07/31/20, exudative. Notably had a bunch of remote healed R rib fx seen on CTA Chest 07/28/20. Had thoracentesis about 3 years ago after a fall and broken ribs - also bloody-appearing.   He has had no AECOPD this year, has been prescribed prednisone at least once in remote past for AECOPD - he does think he had a symptomatic response to it. Does think breathing treatments have been helpful - nebulizer is easier for him to use.   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 No pets at home, never has lived outside of [place].  Father had lung cancer.   Interval HPI: Last seen by me 08/2020 at which point switched to spiriva in setting AF rate control issues. PFTs with moderately severe obstruction, air trapping. Mildly reduced DLCO. Borderline BD response.  Unfortunately had admissions in 09/2020, 12/2020 for ICH - latter involving craniotomy for AVM ligation but he seems to be doing remarkably well after this.  Now only gets winded when he walks uphill. No courses of prednisone since last visit.   He is using incruse 1 puff once daily  ____________________ Maintained on incruse. PFT similar to prior.   Otherwise pertinent review of systems is negative.  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   Tobacco use 07/28/2020     Family History  Problem Relation Age of Onset   Coronary artery disease Mother    Hypertension Mother    Hyperlipidemia Mother    Ovarian cancer Mother    Ovarian cancer Sister    Liver cancer Sister    Liver cancer Brother      Past Surgical History:  Procedure Laterality Date   APPLICATION OF CRANIAL NAVIGATION N/A 12/26/2020   Procedure: APPLICATION OF CRANIAL NAVIGATION;  Surgeon: [name], MD;  Location: [facility] OR;  Service: Neurosurgery;  Laterality: N/A;  RM 21   CARDIOVERSION N/A 08/02/2020   Procedure: CARDIOVERSION;  Surgeon: Meriam Sprague, MD;  Location: [facility] ENDOSCOPY;  Service: Cardiovascular;  Laterality: N/A;   CARDIOVERSION N/A 10/31/2020   Procedure: CARDIOVERSION;  Surgeon: Thomasene Ripple, DO;  Location: [facility] ENDOSCOPY;  Service: Cardiovascular;  Laterality: N/A;   CATARACT EXTRACTION Bilateral 11/2020   CRANIOTOMY Left 12/26/2020   Procedure: Left Subocciptal Cranitomy;  Surgeon: [name], MD;  Location: [facility] OR;  Service: Neurosurgery;  Laterality: Left;   IR ANGIO INTRA EXTRACRAN SEL COM CAROTID INNOMINATE BILAT MOD SED  10/04/2020   IR ANGIO VERTEBRAL SEL SUBCLAVIAN INNOMINATE BILAT MOD SED  10/03/2020   IR THORACENTESIS ASP PLEURAL SPACE W/IMG  GUIDE  07/31/2020   IR US GUIDE VASC ACCESS RIGHT  10/04/2020   TEE WITHOUT CARDIOVERSION N/A 08/02/2020   Procedure: TRANSESOPHAGEAL ECHOCARDIOGRAM (TEE);  Surgeon: Meriam Sprague, MD;  Location: [facility] ENDOSCOPY;  Service: Cardiovascular;  Laterality: N/A;   TRANSURETHRAL RESECTION OF PROSTATE      Social History   Socioeconomic History   Marital status: Divorced    Spouse name: Not on file   Number of children: Not on file   Years of education: Not on file   Highest education level: Not on file  Occupational History   Not on file  Tobacco Use   Smoking status: Former    Packs/day: 1.50    Years: 55.00    Total pack years: 82.50    Types: Cigarettes    Start date: 71    Quit date: 08/14/2020    Years since quitting: 1.1   Smokeless tobacco: Never   Tobacco comments:    Former smoker 01/30/21  Vaping Use   Vaping Use: Never used  Substance and Sexual Activity   Alcohol use: Not Currently    Alcohol/week: 1.0 standard drink of alcohol    Types: 1 Shots of liquor per week   Drug use: Never   Sexual activity: Not on file  Other Topics Concern   Not on file  Social History Narrative   Not on file   Social Determinants of Health   Financial Resource Strain: Not on file  Food Insecurity: Not on file  Transportation Needs: Not on file  Physical Activity: Not on file  Stress: Not on file  Social Connections: Not on file  Intimate Partner Violence: Not on file     Allergies  Allergen Reactions   Ciprofloxacin Shortness Of Breath and Other (See Comments)    Numbness in extremities    Gabapentin Other (See Comments)    Mental problems, depression, anger   Chlorhexidine Gluconate Other (See Comments)    Burning and itching     Outpatient Medications Prior to Visit  Medication Sig Dispense Refill   acetaminophen (TYLENOL) 500 MG tablet Take 1,000 mg by mouth every 6 (six) hours as needed (pain.).     albuterol (VENTOLIN HFA) 108 (90 Base) MCG/ACT inhaler  Inhale 2 puffs into the lungs every 4 (four) hours as needed for wheezing or shortness of breath. 8.5 g 0   amiodarone (PACERONE) 200 MG tablet Take 1 tablet (200 mg) by mouth twice daily till 08/17/2020 and then take 1 tablet (200 mg) once daily from 08/18/2020 onwards as per cardiology 30 tablet 0   apixaban (ELIQUIS) 5 MG TABS tablet Take 1 tablet (5 mg total) by mouth 2 (two) times daily. 180 tablet 3   citalopram (CELEXA) 40 MG tablet Take 40 mg by mouth daily.     folic acid (FOLVITE) 1 MG tablet Take 1 tablet (1 mg total) by mouth daily. 30 tablet 0   furosemide (LASIX) 20 MG tablet Take 1 tablet (20 mg total) by mouth as needed. 30 tablet 11   ketoconazole (  NIZORAL) 2 % cream Apply 1 application topically 2 (two) times daily. GROIN AREA     ketorolac (ACULAR) 0.5 % ophthalmic solution      losartan (COZAAR) 25 MG tablet Take 1 tablet (25 mg total) by mouth daily. 90 tablet 3   lovastatin (MEVACOR) 20 MG tablet Take 1 tablet (20 mg total) by mouth daily. 90 tablet 3   metoprolol succinate (TOPROL XL) 25 MG 24 hr tablet Take 1 tablet (25 mg total) by mouth daily. 90 tablet 3   montelukast (SINGULAIR) 10 MG tablet Take 10 mg by mouth daily.     Multiple Vitamin (MULTIVITAMIN WITH MINERALS) TABS tablet Take 1 tablet by mouth daily.     oxyCODONE (OXY IR/ROXICODONE) 5 MG immediate release tablet Take 5 mg by mouth every 6 (six) hours as needed.     polyethylene glycol (MIRALAX / GLYCOLAX) 17 g packet Take 17 g by mouth daily as needed for mild constipation. 14 each 0   rivastigmine (EXELON) 3 MG capsule TAKE 1 CAPSULE (3 MG TOTAL) BY MOUTH 2 (TWO) TIMES DAILY. 180 capsule 2   terazosin (HYTRIN) 5 MG capsule Take 5 mg by mouth in the morning.     thiamine (VITAMIN B-1) 100 MG tablet Take 100 mg by mouth daily.     umeclidinium bromide (INCRUSE ELLIPTA) 62.5 MCG/ACT AEPB Inhale 1 puff into the lungs daily. 30 each 11   No facility-administered medications prior to visit.       Objective:    Physical Exam: General appearance: 71 y.o., male, NAD, conversant  Eyes: anicteric sclerae; PERRL, tracking appropriately HENT: NCAT; MMM Neck: Trachea midline; no lymphadenopathy, no JVD Lungs: CTAB, no crackles, no wheeze, with normal respiratory effort CV: RRR, no murmur  Abdomen: Soft, non-tender; non-distended, BS present  Extremities: No peripheral edema, warm Skin: Normal turgor and texture; no rash Psych: Appropriate affect Neuro: Alert and oriented to person and place, no focal deficit     There were no vitals filed for this visit.     on RA BMI Readings from Last 3 Encounters:  07/23/21 32.75 kg/m  07/20/21 31.47 kg/m  04/25/21 33.71 kg/m   Wt Readings from Last 3 Encounters:  07/23/21 215 lb 6.4 oz (97.7 kg)  07/20/21 207 lb (93.9 kg)  04/25/21 212 lb (96.2 kg)     CBC    Component Value Date/Time   WBC 5.0 07/20/2021 1155   RBC 4.64 07/20/2021 1155   HGB 13.8 07/20/2021 1155   HGB 13.3 10/26/2020 1039   HCT 40.9 07/20/2021 1155   HCT 39.9 10/26/2020 1039   PLT 212 07/20/2021 1155   PLT 218 10/26/2020 1039   MCV 88.1 07/20/2021 1155   MCV 93 10/26/2020 1039   MCH 29.7 07/20/2021 1155   MCHC 33.7 07/20/2021 1155   RDW 14.4 07/20/2021 1155   RDW 12.7 10/26/2020 1039   LYMPHSABS 0.9 07/20/2021 1155   MONOABS 0.7 07/20/2021 1155   EOSABS 0.3 07/20/2021 1155   BASOSABS 0.0 07/20/2021 1155    Pleural fluid 07/31/20: LD 111 (ratio >0.6) Protein <3 Culture neg  Cytology: mixed acute and chronic inflammation. Grossly dark red in color.  Chest Imaging: CXR 8/14 reviewed by me and remarkable for decreased size of now small R pleural effusion, old healing rib fx on right  CT A/P 8/8 reviewed by me and remarkable for R small-moderate effusion and atelectasis  CTA Chest 07/28/20: reviewed by me and remarkable for R effusion, saber sheath trachea, small pericardial effusion  CT [facility] O[facility] report reviewed by me and remarkable for presence of subacute  healing fractures of right 4th-9th ribs, rounded atelectasis in right lung base  LDCT Chest 09/27/21 reviewed by me with small nodules, emphysema, a few foci of scar in bases   Pulmonary Functions Testing Results:    Latest Ref Rng & Units 09/04/2021   10:44 AM 09/27/2020   10:53 AM  PFT Results  FVC-Pre L 2.58  2.51   FVC-Predicted Pre % 61  59   FVC-Post L 2.99  2.79   FVC-Predicted Post % 71  66   Pre FEV1/FVC % % 58  60   Post FEV1/FCV % % 60  60   FEV1-Pre L 1.48  1.52   FEV1-Predicted Pre % 48  49   FEV1-Post L 1.79  1.68   DLCO uncorrected ml/min/mmHg 17.91  19.29   DLCO UNC% % 72  77   DLCO corrected ml/min/mmHg 17.91  18.87   DLCO COR %Predicted % 72  75   DLVA Predicted % 80  93   TLC L 8.30  7.29   TLC % Predicted % 121  106   RV % Predicted % 231  183    PFT 09/04/21 reviewed by me moderately severe obstruction, air trapping, hyperinflation, BD response, mildly reduced DLCO  PFT 09/27/20 Reviewed by me with moderately severe obstruction, air trapping, mildly reduced DLCO   Echocardiogram:   TTE 07/29/20 with dilated IVC, AF could not assess DD  TEE 7/27 dilated LA      Assessment & Plan:    # COPD gold B: doing well now on incruse without exacerbation.  # Smoking: congratulated him on his effort to stop smoking on his own  # R exudative pleural effusion: has been sampled twice. Cyto negative 07/28/20, unclear if sent on thora 3 years ago at [facility]. This is likely chronic sequel of his traumatic fall/rib fractures 3 years ago given timing and fluid characteristics.   Plan: - continue incruse - referral to lung cancer screening program placed last visit - ongoing smoking cessation strongly encouraged, congratulated effort so far - albuterol inhaler or neb as needed for rescue   RTC prn     Maryjane Hurter, MD [facility] 10/13/2021 11:38 AM

## 2021-10-16 ENCOUNTER — Ambulatory Visit: Payer: Medicare Other | Admitting: Student

## 2021-10-17 ENCOUNTER — Encounter: Payer: Self-pay | Admitting: Student

## 2021-10-17 ENCOUNTER — Ambulatory Visit: Payer: Medicare Other | Admitting: Student

## 2021-10-17 VITALS — BP 124/60 | HR 67 | Temp 97.8°F | Ht 69.0 in | Wt 221.0 lb

## 2021-10-17 DIAGNOSIS — J449 Chronic obstructive pulmonary disease, unspecified: Secondary | ICD-10-CM | POA: Diagnosis not present

## 2021-10-17 MED ORDER — INCRUSE ELLIPTA 62.5 MCG/ACT IN AEPB
1.0000 | INHALATION_SPRAY | Freq: Every day | RESPIRATORY_TRACT | 3 refills | Status: DC
Start: 2021-10-17 — End: 2023-04-17

## 2021-10-17 MED ORDER — TUDORZA PRESSAIR 400 MCG/ACT IN AEPB: 1.0000 | INHALATION_SPRAY | Freq: Two times a day (BID) | RESPIRATORY_TRACT | 0 refills | Status: DC

## 2021-10-17 NOTE — Patient Instructions (Signed)
-   keep using incruse 1 puff once daily - you will be called to schedule ct scan for lung cancer screening next year

## 2022-02-24 ENCOUNTER — Other Ambulatory Visit: Payer: Self-pay | Admitting: Cardiology

## 2022-02-24 DIAGNOSIS — I5030 Unspecified diastolic (congestive) heart failure: Secondary | ICD-10-CM

## 2022-02-24 DIAGNOSIS — I2510 Atherosclerotic heart disease of native coronary artery without angina pectoris: Secondary | ICD-10-CM

## 2022-02-24 DIAGNOSIS — I6523 Occlusion and stenosis of bilateral carotid arteries: Secondary | ICD-10-CM

## 2022-02-24 DIAGNOSIS — I10 Essential (primary) hypertension: Secondary | ICD-10-CM

## 2022-02-24 DIAGNOSIS — E7800 Pure hypercholesterolemia, unspecified: Secondary | ICD-10-CM

## 2022-02-24 DIAGNOSIS — I480 Paroxysmal atrial fibrillation: Secondary | ICD-10-CM

## 2022-03-30 ENCOUNTER — Other Ambulatory Visit: Payer: Self-pay | Admitting: Cardiology

## 2022-03-30 DIAGNOSIS — I10 Essential (primary) hypertension: Secondary | ICD-10-CM

## 2022-03-30 DIAGNOSIS — I480 Paroxysmal atrial fibrillation: Secondary | ICD-10-CM

## 2022-04-01 ENCOUNTER — Telehealth: Payer: Self-pay | Admitting: Student

## 2022-04-01 NOTE — Telephone Encounter (Signed)
Called PT from Recall list and he said he is feeling great with no breathing issues. Did not want to sched FU.   Per Mel Almond, letting Dr. Gwyndolyn Kaufman and noting chart.

## 2022-04-02 NOTE — Telephone Encounter (Signed)
Dr. Verlee Monte just an [facility]

## 2022-04-10 NOTE — Telephone Encounter (Signed)
x

## 2022-04-29 ENCOUNTER — Ambulatory Visit: Payer: Medicare Other | Admitting: Neurology

## 2022-04-29 ENCOUNTER — Encounter: Payer: Self-pay | Admitting: Neurology

## 2022-05-25 IMAGING — DX DG CHEST 1V PORT
1 series · 1 of 1 positions shown · non-contrast
Comparison: Prior chest radiographs 08/03/2020 and earlier. CT
angiogram chest 07/28/2020.

CLINICAL DATA: Altered mental status. Additional history provided:
Confusion, hearing voices.

EXAM:
PORTABLE CHEST 1 VIEW

[chest ap]
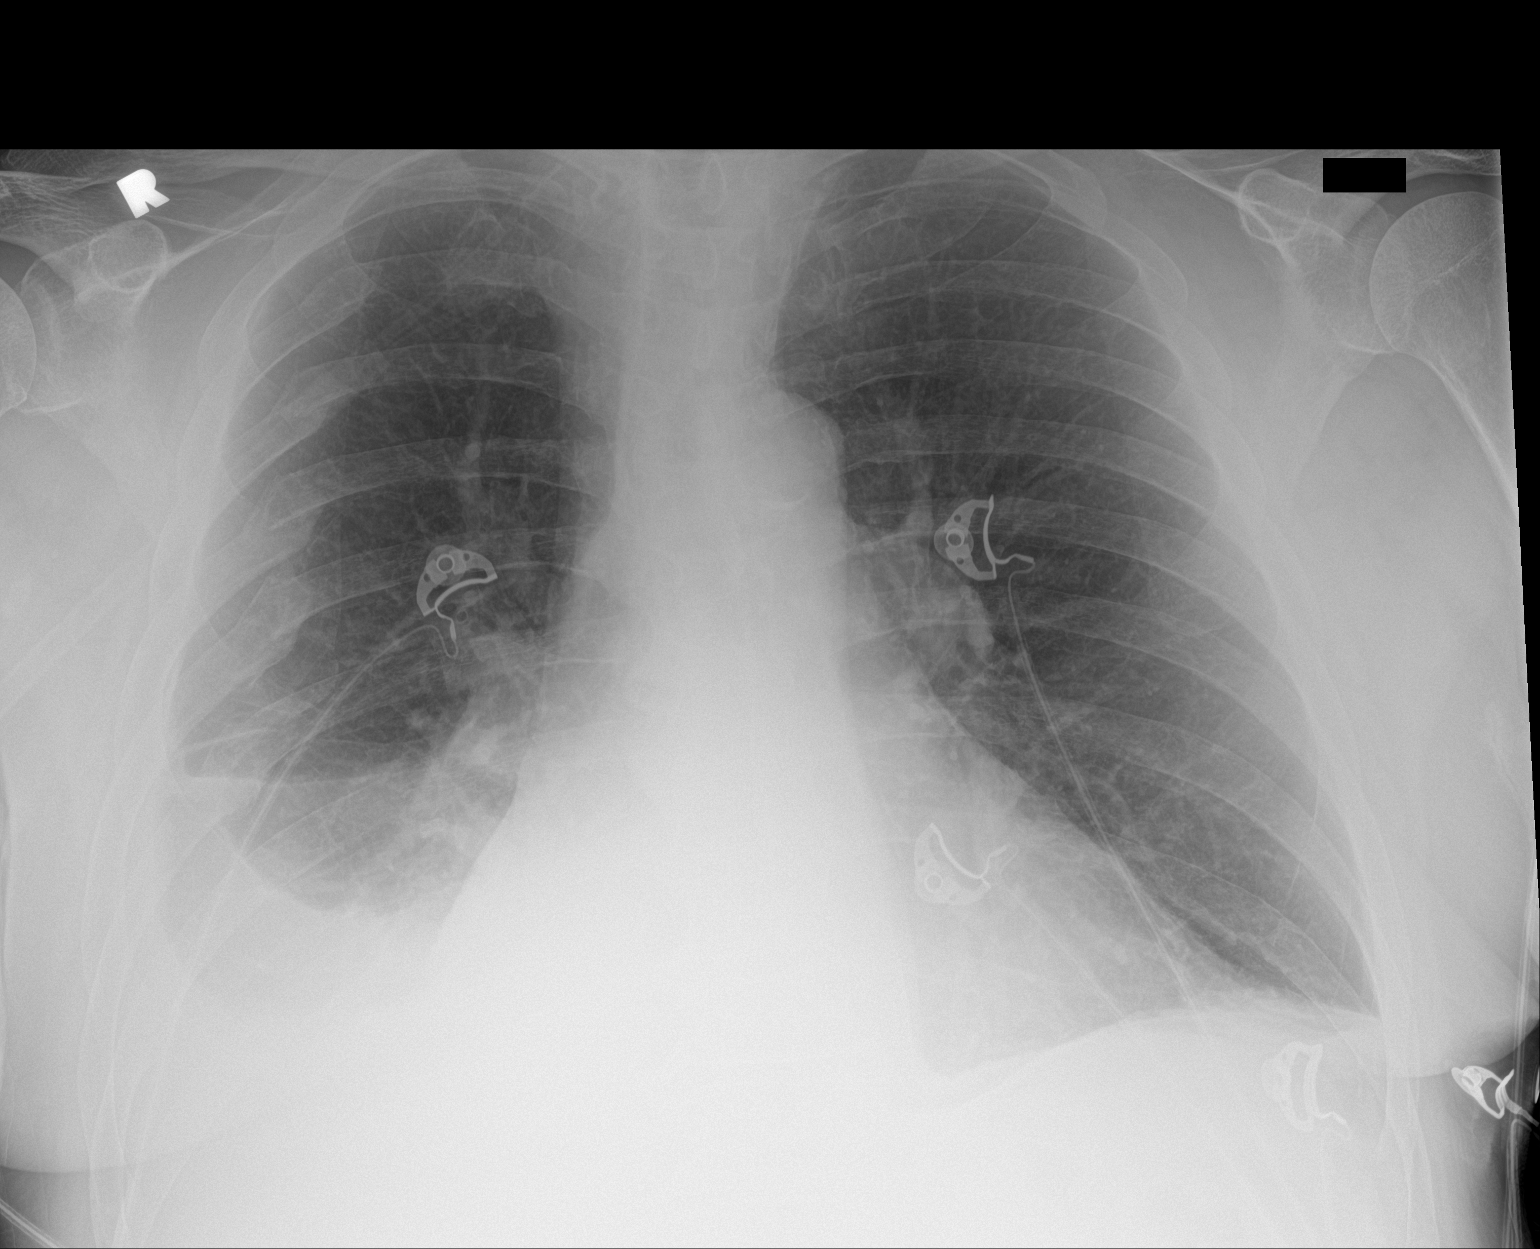

[1 of 1 positions shown; findings below may reference images not displayed]

FINDINGS: Unchanged cardiomegaly. Aortic atherosclerosis. Persistent right
pleural effusion with associated right basilar atelectasis and/or
consolidation. Mild atelectasis within the left lung base. No
evidence of pneumothorax. Redemonstrated chronic right-sided rib
fracture deformities.
IMPRESSION: Persistent right pleural effusion with associated right basilar
atelectasis and/or consolidation.

Mild atelectasis within the left lung base, new from the prior exam
of 08/03/2020.

Unchanged cardiomegaly.

Aortic Atherosclerosis ([id number]).

## 2022-06-28 ENCOUNTER — Other Ambulatory Visit: Payer: Self-pay | Admitting: Acute Care

## 2022-06-28 DIAGNOSIS — Z122 Encounter for screening for malignant neoplasm of respiratory organs: Secondary | ICD-10-CM

## 2022-06-28 DIAGNOSIS — Z87891 Personal history of nicotine dependence: Secondary | ICD-10-CM

## 2022-07-02 ENCOUNTER — Other Ambulatory Visit: Payer: Self-pay

## 2022-07-02 MED ORDER — APIXABAN 5 MG PO TABS: 5.0000 mg | ORAL_TABLET | Freq: Two times a day (BID) | ORAL | 3 refills | Status: DC

## 2022-07-02 NOTE — Telephone Encounter (Signed)
Prescription refill request for Eliquis received. Indication:afib Last office visit:7/23 Scr:0.77  7/23 Age: 72 Weight:100.2  kg  Prescription refilled

## 2022-08-19 ENCOUNTER — Encounter (HOSPITAL_COMMUNITY): Payer: Self-pay

## 2022-08-19 ENCOUNTER — Other Ambulatory Visit: Payer: Self-pay

## 2022-08-19 ENCOUNTER — Emergency Department (HOSPITAL_COMMUNITY): Payer: Medicare Other

## 2022-08-19 ENCOUNTER — Emergency Department (HOSPITAL_COMMUNITY)
Admission: EM | Admit: 2022-08-19 | Discharge: 2022-08-19 | Disposition: A | Discharge status: Home / Self Care | Payer: Medicare Other | Source: Home / Self Care | Attending: Emergency Medicine | Admitting: Emergency Medicine

## 2022-08-19 DIAGNOSIS — N368 Other specified disorders of urethra: Secondary | ICD-10-CM | POA: Diagnosis present

## 2022-08-19 DIAGNOSIS — B372 Candidiasis of skin and nail: Secondary | ICD-10-CM

## 2022-08-19 DIAGNOSIS — Z7901 Long term (current) use of anticoagulants: Secondary | ICD-10-CM | POA: Insufficient documentation

## 2022-08-19 DIAGNOSIS — R319 Hematuria, unspecified: Secondary | ICD-10-CM | POA: Diagnosis not present

## 2022-08-19 LAB — BASIC METABOLIC PANEL
Anion gap: 10 (ref 5–15)
BUN: 18 mg/dL (ref 8–23)
CO2: 24 mmol/L (ref 22–32)
Calcium: 8.5 mg/dL — ABNORMAL LOW (ref 8.9–10.3)
Chloride: 97 mmol/L — ABNORMAL LOW (ref 98–111)
Creatinine, Ser: 0.78 mg/dL (ref 0.61–1.24)
GFR, Estimated: >60 mL/min (ref >60)
Glucose, Bld: 98 mg/dL (ref 70–99)
Potassium: 4.5 mmol/L (ref 3.5–5.1)
Sodium: 131 mmol/L — ABNORMAL LOW (ref 135–145)

## 2022-08-19 LAB — URINALYSIS, ROUTINE W REFLEX MICROSCOPIC
Bacteria, UA: NONE SEEN
Bilirubin Urine: NEGATIVE
Glucose, UA: NEGATIVE mg/dL
Ketones, ur: NEGATIVE mg/dL
Leukocytes,Ua: NEGATIVE
Nitrite: NEGATIVE
Protein, ur: NEGATIVE mg/dL
Specific Gravity, Urine: 1.014 (ref 1.005–1.030)
pH: 6 (ref 5.0–8.0)

## 2022-08-19 LAB — CBC WITH DIFFERENTIAL/PLATELET
Abs Immature Granulocytes: 0.03 10*3/uL (ref 0.00–0.07)
Basophils Absolute: 0.1 10*3/uL (ref 0.0–0.1)
Basophils Relative: 1 %
Eosinophils Absolute: 0.2 10*3/uL (ref 0.0–0.5)
Eosinophils Relative: 4 %
HCT: 37.2 % — ABNORMAL LOW (ref 39.0–52.0)
Hemoglobin: 12.4 g/dL — ABNORMAL LOW (ref 13.0–17.0)
Immature Granulocytes: 1 %
Lymphocytes Relative: 15 %
Lymphs Abs: 0.7 10*3/uL (ref 0.7–4.0)
MCH: 31.1 pg (ref 26.0–34.0)
MCHC: 33.3 g/dL (ref 30.0–36.0)
MCV: 93.2 fL (ref 80.0–100.0)
Monocytes Absolute: 0.7 10*3/uL (ref 0.1–1.0)
Monocytes Relative: 14 %
Neutro Abs: 3.2 10*3/uL (ref 1.7–7.7)
Neutrophils Relative %: 65 %
Platelets: 213 10*3/uL (ref 150–400)
RBC: 3.99 MIL/uL — ABNORMAL LOW (ref 4.22–5.81)
RDW: 13.4 % (ref 11.5–15.5)
WBC: 4.9 10*3/uL (ref 4.0–10.5)
nRBC: 0 % (ref 0.0–0.2)

## 2022-08-19 LAB — PROTIME-INR
INR: 1.4 — ABNORMAL HIGH (ref 0.8–1.2)
Prothrombin Time: 17 seconds — ABNORMAL HIGH (ref 11.4–15.2)

## 2022-08-19 MED ORDER — FLUCONAZOLE 100 MG PO TABS: 100.0000 mg | ORAL_TABLET | Freq: Every day | ORAL | 0 refills | Status: AC

## 2022-08-19 MED ORDER — NYSTATIN 100000 UNIT/GM EX CREA: TOPICAL_CREAM | CUTANEOUS | 0 refills | Status: DC

## 2022-08-19 NOTE — Discharge Instructions (Addendum)
Your testing is normal, other than a small amount of blood in your urine and there is no other findings on the CAT scan of concern, no signs of other abnormalities, you can definitely follow-up in the office without needing to worry about this even though you may still have a small amount of blood here or there.  It is otherwise of no concern.  I would also like for you to apply the cream twice a day that I have prescribed and take the pill once a day for 7 days.  This should help to clear up your groin.  Thank you for allowing [place] to treat you in the emergency department today.  After reviewing your examination and potential testing that was done it appears that you are safe to go home.  I would like for you to follow-up with your doctor within the next several days, have them obtain your results and follow-up with them to review all of these tests.  If you should develop severe or worsening symptoms return to the emergency department immediately

## 2022-08-19 NOTE — ED Triage Notes (Signed)
Pt [facility] for bleeding from his penis, is on blood thinners.  States this has never happened before, has had half of his prostate and bladder removed.   132/68 Hr 61 96% RA CBG 120

## 2022-08-19 NOTE — ED Provider Notes (Signed)
[facility] Provider Note   CSN: [id number] Arrival date & time: 08/19/22  1029     History  Chief Complaint  Patient presents with   Bleeding/Bruising    Samuel Barnes is a 73 y.o. male.  HPI   This patient is a 72 year old male on Eliquis and amiodarone for atrial fibrillation presenting with a complaint of urethral bleeding, states he was in the shower and when he looked down he saw that he had blood drops coming from his urethra.  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  Denies any other bleeding including nose gastrointestinal or gums.  No other complaints at this time.  Comes by EMS who reported normal vital signs  Home Medications Prior to Admission medications   Medication Sig Start Date End Date Taking? Authorizing Provider  fluconazole (DIFLUCAN) 100 MG tablet Take 1 tablet (100 mg total) by mouth daily for 7 days. 08/19/22 08/26/22 Yes Eber Hong, MD  nystatin cream (MYCOSTATIN) Apply to affected area 2 times daily 08/19/22  Yes Eber Hong, MD  acetaminophen (TYLENOL) 500 MG tablet Take 1,000 mg by mouth every 6 (six) hours as needed (pain.).    [provider]  Aclidinium Bromide (TUDORZA PRESSAIR) 400 MCG/ACT AEPB Inhale 1 puff into the lungs in the morning and at bedtime. 10/17/21   Omar Person, MD  albuterol (VENTOLIN HFA) 108 (90 Base) MCG/ACT inhaler Inhale 2 puffs into the lungs every 4 (four) hours as needed for wheezing or shortness of breath. 02/20/21   Omar Person, MD  amiodarone (PACERONE) 200 MG tablet Take 1 tablet (200 mg) by mouth twice daily till 08/17/2020 and then take 1 tablet (200 mg) once daily from 08/18/2020 onwards as per cardiology 08/08/20    Glade Lloyd, MD  apixaban (ELIQUIS) 5 MG TABS tablet Take 1 tablet (5 mg total) by mouth 2 (two) times daily. 07/02/22   [name], MD  citalopram (CELEXA) 40 MG tablet Take 40 mg by mouth daily. 07/19/21   [provider]  folic acid (FOLVITE) 1 MG tablet Take 1 tablet (1 mg total) by mouth daily. 08/09/20   Glade Lloyd, MD  furosemide (LASIX) 20 MG tablet Take 1 tablet (20 mg total) by mouth as needed. 02/22/21   [name], MD  ketoconazole (NIZORAL) 2 % cream Apply 1 application topically 2 (two) times daily. GROIN AREA 10/11/20   [provider]  ketorolac (ACULAR) 0.5 % ophthalmic solution  10/24/20   [provider]  losartan (COZAAR) 25 MG tablet Take 1 tablet (25 mg total) by mouth daily. Pt needs to make appt with provider to continue getting refills 02/25/22   [name], MD  lovastatin (MEVACOR) 20 MG tablet Take 1 tablet (20 mg total) by mouth daily. 02/22/21   [name], MD  metoprolol succinate (TOPROL-XL) 25 MG 24 hr tablet Take 1 tablet (25 mg total) by mouth daily. 04/01/22   [name], MD  montelukast (SINGULAIR) 10 MG tablet Take 10 mg by mouth daily.    [provider]  Multiple Vitamin (MULTIVITAMIN WITH MINERALS) TABS tablet Take 1 tablet by mouth daily. 08/09/20   Glade Lloyd, MD  polyethylene glycol [facility] /  GLYCOLAX) 17 g packet Take 17 g by mouth daily as needed for mild constipation. 08/21/20   Rolly Salter, MD  rivastigmine (EXELON) 3 MG capsule TAKE 1 CAPSULE (3 MG TOTAL) BY MOUTH 2 (TWO) TIMES DAILY. 08/27/21 05/24/22  Windell Norfolk, MD  terazosin (HYTRIN) 5 MG capsule Take 5 mg by mouth in the morning.    [provider]  thiamine (VITAMIN B-1) 100 MG tablet Take 100 mg by mouth daily. 01/21/21   [provider]  umeclidinium bromide (INCRUSE ELLIPTA) 62.5 MCG/ACT AEPB Inhale 1 puff into the lungs daily. 10/17/21   Omar Person, MD      Allergies    Ciprofloxacin, Gabapentin,  and Chlorhexidine gluconate    Review of Systems   Review of Systems  All other systems reviewed and are negative.   Physical Exam Updated Vital Signs BP (!) 154/69 (BP Location: Right Arm)   Pulse (!) 56   Temp 97.8 F (36.6 C) (Oral)   Resp 12   Ht 1.753 m (5\' 9" )   Wt 100 kg   SpO2 100%   BMI 32.56 kg/m  Physical Exam Vitals and nursing note reviewed.  Constitutional:      General: He is not in acute distress.    Appearance: He is well-developed.  HENT:     Head: Normocephalic and atraumatic.     Mouth/Throat:     Pharynx: No oropharyngeal exudate.  Eyes:     General: No scleral icterus.       Right eye: No discharge.        Left eye: No discharge.     Conjunctiva/sclera: Conjunctivae normal.     Pupils: Pupils are equal, round, and reactive to light.  Neck:     Thyroid: No thyromegaly.     Vascular: No JVD.  Cardiovascular:     Rate and Rhythm: Normal rate and regular rhythm.     Heart sounds: Normal heart sounds. No murmur heard.    No friction rub. No gallop.  Pulmonary:     Effort: Pulmonary effort is normal. No respiratory distress.     Breath sounds: Normal breath sounds. No wheezing or rales.  Abdominal:     General: Bowel sounds are normal. There is no distension.     Palpations: Abdomen is soft. There is no mass.     Tenderness: There is no abdominal tenderness.  Musculoskeletal:        General: No tenderness. Normal range of motion.     Cervical back: Normal range of motion and neck supple.  Lymphadenopathy:     Cervical: No cervical adenopathy.  Skin:    General: Skin is warm and dry.     Findings: No erythema or rash.     Comments: Intertrigo present under lower abdominal pannus as well as left inguinal region, several satellite lesions present including on the shaft of the penis and the penile head.  There is no blood at the urethral meatus, normal scrotal exam, normal testicular exam  Neurological:     Mental Status: He is alert.      Coordination: Coordination normal.  Psychiatric:        Behavior: Behavior normal.     ED Results / Procedures / Treatments   Labs (all labs ordered are listed, but only abnormal results are displayed) Labs Reviewed  CBC WITH DIFFERENTIAL/PLATELET - Abnormal; Notable for the following components:      Result Value   RBC 3.99 (*)    Hemoglobin  12.4 (*)    HCT 37.2 (*)    All other components within normal limits  BASIC METABOLIC PANEL - Abnormal; Notable for the following components:   Sodium 131 (*)    Chloride 97 (*)    Calcium 8.5 (*)    All other components within normal limits  PROTIME-INR - Abnormal; Notable for the following components:   Prothrombin Time 17.0 (*)    INR 1.4 (*)    All other components within normal limits  URINALYSIS, ROUTINE W REFLEX MICROSCOPIC - Abnormal; Notable for the following components:   Hgb urine dipstick LARGE (*)    All other components within normal limits    EKG EKG Interpretation Date/Time:  Monday August 19 2022 10:43:00 EDT Ventricular Rate:  52 PR Interval:  193 QRS Duration:  96 QT Interval:  501 QTC Calculation: 466 R Axis:   3  Text Interpretation: Sinus rhythm Anterior infarct, old Confirmed by Eber Hong ([id number]) on 08/19/2022 10:45:38 AM  Radiology CT Renal Stone Study  Result Date: 08/19/2022 CLINICAL DATA:  Flank pain EXAM: CT ABDOMEN AND PELVIS WITHOUT CONTRAST TECHNIQUE: Multidetector CT imaging of the abdomen and pelvis was performed following the standard protocol without IV contrast. RADIATION DOSE REDUCTION: This exam was performed according to the departmental dose-optimization program which includes automated exposure control, adjustment of the mA and/or kV according to patient size and/or use of iterative reconstruction technique. COMPARISON:  CT 08/14/2020 FINDINGS: Lower chest: There is some linear opacity seen along bases likely scar or atelectasis. No pleural effusion. Hepatobiliary: On this non IV contrast  exam there are some small cystic foci in the liver, benign. Two lesions in segment 4 are actually much smaller today than previous no specific imaging follow-up Pancreas: Unremarkable. No pancreatic ductal dilatation or surrounding inflammatory changes. Spleen: Spleen is enlarged with the AP length of 14.9 cm Adrenals/Urinary Tract: Adrenal glands are preserved no abnormal calcifications are seen within either kidney nor along the course of either ureter preserved contours of the urinary bladder. There are numerous cysts seen in each kidney. Example exophytic anterior in the left measures 4.9 cm with Hounsfield units of 16. Lateral focus has Hounsfield unit of 6 and diameter 5.5 cm. Similar foci in the right. These are unchanged from the study of August 2022. No additional imaging follow-up. Stomach/Bowel: No oral contrast. Large bowel has a normal course and caliber. Moderate colonic stool. Normal appendix. Sigmoid colon diverticula. There is redundant course of the sigmoid colon. Stomach and small bowel are nondilated normal Vascular/Lymphatic: Diffuse vascular calcifications identified. This includes branch vessels as well as the aorta. There are multilevel areas of potential stenosis. Please correlate with any particular symptoms such as hypertension. Claudication. Normal caliber IVC. No specific abnormal lymph node enlargement identified in the abdomen and pelvis Reproductive: Prominent prostate Other: Anasarca. Small fat containing right inguinal hernia greater than left. No free air or free fluid Musculoskeletal: Grade 2 anterolisthesis of L5 on S1. Trace retrolisthesis of L4-5. Multilevel disc bulging and stenosis. IMPRESSION: No obstructing renal or ureteral stones. No bowel obstruction.  Colonic diverticulosis.  Normal appendix. Extensive vascular calcifications. 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 Multilevel lumbar spine  stenosis Electronically Signed   By: Karen Kays M.D.   On: 08/19/2022 12:23    Procedures Procedures    Medications Ordered in ED Medications - No data to display  ED Course/ Medical Decision Making/ A&P  Medical Decision Making Amount and/or Complexity of Data Reviewed Labs: ordered. Radiology: ordered.  Risk Prescription drug management.   Well-appearing, hematuria, likely in part related to being anticoagulated, he is concerned that it may be kidneys, he has no other renal symptoms.  Will obtain labs, CT renal, patient otherwise well-appearing, very stable  Labs unremarkable, no significant anemia, urinalysis with hemoglobin but no significant bacteria or signs of infection.  Blood counts without leukocytosis, CBC and metabolic panel reviewed, no renal dysfunction or significant electrolyte abnormalities.  CT scan of the abdomen and pelvis reviewed, there does appear to be a kidney lesions which are unchanged from August 2022 but no new findings and nothing that would suggest a cause of hematuria  Patient given results, well-appearing, stable, vital signs unremarkable, patient will be stable for discharge to follow-up.  No signs of significant blood loss  Will treat for his fungal infection with topical nystatin and oral fluconazole for 7 days.  The patient is agreeable        Final Clinical Impression(s) / ED Diagnoses Final diagnoses:  Hematuria, unspecified type  Skin candidiasis    Rx / DC Orders ED Discharge Orders          Ordered    fluconazole (DIFLUCAN) 100 MG tablet  Daily        08/19/22 1355    nystatin cream (MYCOSTATIN)        08/19/22 1355              Eber Hong, MD 08/19/22 1356

## 2022-09-16 ENCOUNTER — Ambulatory Visit (HOSPITAL_COMMUNITY)
Admission: RE | Admit: 2022-09-16 | Payer: Medicare Other | Source: Ambulatory Visit | Attending: Cardiology | Admitting: Cardiology

## 2022-09-24 ENCOUNTER — Ambulatory Visit (HOSPITAL_COMMUNITY)
Admission: RE | Admit: 2022-09-24 | Discharge: 2022-09-24 | Disposition: A | Discharge status: Home / Self Care | Payer: Medicare Other | Source: Ambulatory Visit | Attending: Cardiology | Admitting: Cardiology

## 2022-09-24 DIAGNOSIS — I779 Disorder of arteries and arterioles, unspecified: Secondary | ICD-10-CM | POA: Diagnosis present

## 2022-09-24 DIAGNOSIS — I6522 Occlusion and stenosis of left carotid artery: Secondary | ICD-10-CM | POA: Insufficient documentation

## 2022-09-25 ENCOUNTER — Encounter: Payer: Self-pay | Admitting: Cardiology

## 2022-09-30 ENCOUNTER — Ambulatory Visit (HOSPITAL_BASED_OUTPATIENT_CLINIC_OR_DEPARTMENT_OTHER)
Admission: RE | Admit: 2022-09-30 | Discharge: 2022-09-30 | Disposition: A | Discharge status: Home / Self Care | Payer: Medicare Other | Source: Ambulatory Visit | Attending: Cardiology | Admitting: Cardiology

## 2022-09-30 DIAGNOSIS — Z122 Encounter for screening for malignant neoplasm of respiratory organs: Secondary | ICD-10-CM | POA: Diagnosis present

## 2022-09-30 DIAGNOSIS — Z87891 Personal history of nicotine dependence: Secondary | ICD-10-CM | POA: Insufficient documentation

## 2022-10-03 ENCOUNTER — Telehealth: Payer: Self-pay

## 2022-10-03 DIAGNOSIS — Z79899 Other long term (current) drug therapy: Secondary | ICD-10-CM

## 2022-10-03 DIAGNOSIS — E7800 Pure hypercholesterolemia, unspecified: Secondary | ICD-10-CM

## 2022-10-03 DIAGNOSIS — I6523 Occlusion and stenosis of bilateral carotid arteries: Secondary | ICD-10-CM

## 2022-10-03 NOTE — Telephone Encounter (Signed)
-----   Message from Nurse Sherri P sent at 09/25/2022  6:25 PM EDT ----- Alcario Drought, forwarding to you as Tresa Endo said you were in follow up tomorrow. ----- Message ----- From: [name], MD Sent: 09/25/2022   5:01 PM EDT To: [facility] Triage  1-39% right and 40-59% left carotid artery stenosis by dopplers and flow disturbance in left subclavian.  Please find out if he has had any problems with left arm numbness/weakness or tingling.  Repeat dopplers in 1 year.  Needs to come in for repeat FLP and ALT

## 2022-10-03 NOTE — Telephone Encounter (Signed)
Call to discuss results, no answer. Left message with no identifiers asking recipient to call our office.

## 2022-10-03 NOTE — Telephone Encounter (Signed)
Pt was returning nurse call regarding results and is now requesting a callback. Please advise.

## 2022-10-04 NOTE — Addendum Note (Signed)
Addended by: Rexene Edison L on: 10/04/2022 11:13 AM   Modules accepted: Orders

## 2022-10-04 NOTE — Telephone Encounter (Signed)
Call to patient to review Dr. Norris Cross recommendation. Patient with 1-39% right and 40-59% left carotid artery stenosis by dopplers and flow disturbance in left subclavian.  Patient denies he has had any problems with left arm numbness/weakness or tingling. Order placed for repeat dopplers in 1 year.  Repeat FLP and ALT orders placed and released as ptient prefers to do labs at [facility] office.

## 2022-10-09 LAB — LIPID PANEL
Chol/HDL Ratio: 2.9 {ratio} (ref 0.0–5.0)
Cholesterol, Total: 106 mg/dL (ref 100–199)
HDL: 36 mg/dL — ABNORMAL LOW (ref >39)
LDL Chol Calc (NIH): 57 mg/dL (ref 0–99)
Triglycerides: 55 mg/dL (ref 0–149)
VLDL Cholesterol Cal: 13 mg/dL (ref 5–40)

## 2022-10-09 LAB — ALT: ALT: 44 [IU]/L (ref 0–44)

## 2022-10-10 ENCOUNTER — Telehealth: Payer: Self-pay

## 2022-10-10 NOTE — Telephone Encounter (Signed)
-----   Message from Armanda Magic sent at 10/09/2022  8:39 PM EDT ----- Lipids at goal continue current therapy and forward to PCP

## 2022-10-10 NOTE — Telephone Encounter (Signed)
Call to patient to advise that lipids at goal and to continue current therapy Per Dr. Mayford Knife. Patient verbalizes understanding. Forwarded results to PCP.

## 2022-10-14 ENCOUNTER — Other Ambulatory Visit: Payer: Self-pay | Admitting: Acute Care

## 2022-10-14 DIAGNOSIS — Z122 Encounter for screening for malignant neoplasm of respiratory organs: Secondary | ICD-10-CM

## 2022-10-14 DIAGNOSIS — Z87891 Personal history of nicotine dependence: Secondary | ICD-10-CM

## 2022-10-21 ENCOUNTER — Ambulatory Visit (HOSPITAL_BASED_OUTPATIENT_CLINIC_OR_DEPARTMENT_OTHER): Payer: Medicare Other | Admitting: Family

## 2022-10-21 ENCOUNTER — Telehealth (HOSPITAL_BASED_OUTPATIENT_CLINIC_OR_DEPARTMENT_OTHER): Payer: Self-pay

## 2022-10-21 ENCOUNTER — Encounter (HOSPITAL_BASED_OUTPATIENT_CLINIC_OR_DEPARTMENT_OTHER): Payer: Self-pay | Admitting: Family

## 2022-10-21 VITALS — BP 110/62 | HR 82 | Ht 68.0 in | Wt 213.8 lb

## 2022-10-21 DIAGNOSIS — I5032 Chronic diastolic (congestive) heart failure: Secondary | ICD-10-CM

## 2022-10-21 DIAGNOSIS — I25118 Atherosclerotic heart disease of native coronary artery with other forms of angina pectoris: Secondary | ICD-10-CM

## 2022-10-21 DIAGNOSIS — I2510 Atherosclerotic heart disease of native coronary artery without angina pectoris: Secondary | ICD-10-CM

## 2022-10-21 DIAGNOSIS — I6523 Occlusion and stenosis of bilateral carotid arteries: Secondary | ICD-10-CM

## 2022-10-21 DIAGNOSIS — E785 Hyperlipidemia, unspecified: Secondary | ICD-10-CM | POA: Diagnosis not present

## 2022-10-21 DIAGNOSIS — E7800 Pure hypercholesterolemia, unspecified: Secondary | ICD-10-CM

## 2022-10-21 DIAGNOSIS — I480 Paroxysmal atrial fibrillation: Secondary | ICD-10-CM | POA: Diagnosis not present

## 2022-10-21 DIAGNOSIS — I5030 Unspecified diastolic (congestive) heart failure: Secondary | ICD-10-CM

## 2022-10-21 DIAGNOSIS — D6859 Other primary thrombophilia: Secondary | ICD-10-CM

## 2022-10-21 DIAGNOSIS — I10 Essential (primary) hypertension: Secondary | ICD-10-CM

## 2022-10-21 MED ORDER — APIXABAN 5 MG PO TABS: 5.0000 mg | ORAL_TABLET | Freq: Two times a day (BID) | ORAL | 3 refills | Status: DC

## 2022-10-21 MED ORDER — METOPROLOL SUCCINATE ER 25 MG PO TB24
25.0000 mg | ORAL_TABLET | Freq: Every day | ORAL | 3 refills | Status: DC
Start: 2022-10-21 — End: 2023-04-17

## 2022-10-21 MED ORDER — APIXABAN 5 MG PO TABS: 5.0000 mg | ORAL_TABLET | Freq: Two times a day (BID) | ORAL | Status: DC

## 2022-10-21 MED ORDER — LOSARTAN POTASSIUM 25 MG PO TABS
25.0000 mg | ORAL_TABLET | Freq: Every day | ORAL | 3 refills | Status: DC
Start: 2022-10-21 — End: 2023-04-29

## 2022-10-21 MED ORDER — LOVASTATIN 20 MG PO TABS
20.0000 mg | ORAL_TABLET | Freq: Every day | ORAL | 3 refills | Status: DC
Start: 2022-10-21 — End: 2023-04-29

## 2022-10-21 MED ORDER — AMLODIPINE BESYLATE 5 MG PO TABS: 5.0000 mg | ORAL_TABLET | Freq: Every day | ORAL | 3 refills | Status: DC

## 2022-10-21 MED ORDER — AMIODARONE HCL 200 MG PO TABS: 200.0000 mg | ORAL_TABLET | Freq: Every day | ORAL | 3 refills | Status: DC

## 2022-10-21 NOTE — Progress Notes (Signed)
215 msec    TR Vmax:        232.00 cm/s MV E velocity: 93.80 cm/s SHUNTS Systemic VTI:  0.14 m Systemic Diam: 2.00 cm  Armanda Magic MD Electronically signed by Armanda Magic MD Signature Date/Time: 07/30/2020/9:43:47 AM    Final   TEE  ECHO TEE 08/02/2020  Narrative TRANSESOPHOGEAL ECHO REPORT    Patient Name:   Samuel Barnes Date of Exam: 08/02/2020 Medical Rec #:  [id number]             Height:       68.0 in Accession #:    [id number]            Weight:       215.2 lb Date of Birth:  03-May-1950             BSA:          2.108 m Patient Age:    72 years              BP:           144/77 mmHg Patient Gender: M                     HR:           48 bpm. Exam Location:  Inpatient  Procedure: Transesophageal Echo, 3D Echo, Cardiac Doppler and Color Doppler  Indications:     I48.1 Persistent atrial fibrillation  History:         Patient has prior history of Echocardiogram examinations, most recent 07/30/2020. Risk Factors:Hypertension, Dyslipidemia and Current Smoker. Acute hypoxic respiratory failure likely 2/2 acute CHF. ETOH Abuse.  Sonographer:     Leta Jungling RDCS Referring Phys:  [id number] Beatriz Stallion Diagnosing Phys: Laurance Flatten MD  PROCEDURE: After discussion of the risks and benefits of a TEE, an informed consent was obtained from the patient. The transesophogeal probe was passed without difficulty through the esophogus of the patient. Imaged were obtained with the patient in a left lateral decubitus position. Sedation performed by different physician. The patient was monitored while under deep sedation. Anesthestetic sedation was provided intravenously by Anesthesiology: 225.92mg  of Propofol, 30mg  of Lidocaine. Image quality was good. The patient's vital signs; including heart rate, blood pressure, and oxygen saturation; remained stable throughout the procedure. The patient developed no complications during the procedure.  IMPRESSIONS   1. Left ventricular ejection fraction, by estimation, is 55 to 60%. The left ventricle has normal function. 2. Right ventricular systolic function is normal. The right ventricular  size is normal. 3. Left atrial size was mild to moderately dilated. No left atrial/left atrial appendage thrombus was detected. 4. Right atrial size was moderately dilated. 5. The mitral valve is normal in structure. Mild mitral valve regurgitation. No evidence of mitral stenosis. 6. The aortic valve is tricuspid. There is mild calcification of the aortic valve. There is mild thickening of the aortic valve. Aortic valve regurgitation is not visualized. Mild aortic valve sclerosis is present, with no evidence of aortic valve stenosis. 7. There is Moderate (Grade III) plaque. 8. Successful DCCV performed (200J x1) after TEE with return to NSR. Please see separate note.  FINDINGS Left Ventricle: Left ventricular ejection fraction, by estimation, is 55 to 60%. The left ventricle has normal function. The left ventricular internal cavity size was normal in size.  Right Ventricle: The right ventricular size is normal. No increase in right ventricular wall thickness. Right ventricular systolic function is normal.  septal myocardium toward apex consistent with old infarction. No definite evidence of reversibility is noted to suggest acute ischemia.  2. Normal left ventricular  wall motion.  3. Left ventricular ejection fraction 65%  4. Non invasive risk stratification*: Low  *2012 Appropriate Use Criteria for Coronary Revascularization Focused Update: J Am Coll Cardiol. 2012;59(9):857-881. http://content.dementiazones.com.aspx?articleid=1201161   Electronically Signed By: Lupita Raider M.D. On: 07/30/2020 14:18   ECHOCARDIOGRAM  ECHOCARDIOGRAM COMPLETE 07/29/2020  Narrative ECHOCARDIOGRAM REPORT    Patient Name:   Samuel Barnes Date of Exam: 07/29/2020 Medical Rec #:  [id number]             Height:       68.0 in Accession #:    [id number]            Weight:       231.3 lb Date of Birth:  1950/10/03             BSA:          2.174 m Patient Age:    72 years              BP:           139/80 mmHg Patient Gender: M                     HR:           68 bpm. Exam Location:  Inpatient  Procedure: 2D Echo, Cardiac Doppler, Color Doppler and Intracardiac Opacification Agent  Indications:    CHF Atrial fibrillation  History:        Patient has no prior history of Echocardiogram examinations. CAD; Risk Factors:Dyslipidemia, Hypertension and Current Smoker. ETOH use.  Sonographer:    Ross Ludwig RDCS (AE) Referring Phys: [id number] DAVID Kelby Fam ORTIZ   Sonographer Comments: Patient is morbidly obese and Technically difficult study due to poor echo windows. Image acquisition challenging due to patient body habitus. IMPRESSIONS   1. Left ventricular ejection fraction, by estimation, is 55 to 60%. The left ventricle has normal function. The left ventricle has no regional wall motion abnormalities. Left ventricular diastolic function could not be evaluated. 2. Prominent moderator band of no clinical significance. Right ventricular systolic function is mildly reduced. The right ventricular size is normal. There is mildly elevated pulmonary artery systolic pressure. The estimated right ventricular systolic pressure is 36.5 mmHg. 3. The mitral  valve is normal in structure. No evidence of mitral valve regurgitation. No evidence of mitral stenosis. 4. The aortic valve is normal in structure. Aortic valve regurgitation is not visualized. No aortic stenosis is present. 5. The inferior vena cava is dilated in size with <50% respiratory variability, suggesting right atrial pressure of 15 mmHg.  FINDINGS Left Ventricle: Left ventricular ejection fraction, by estimation, is 55 to 60%. The left ventricle has normal function. The left ventricle has no regional wall motion abnormalities. Definity contrast agent was given IV to delineate the left ventricular endocardial borders. The left ventricular internal cavity size was normal in size. There is no left ventricular hypertrophy. Left ventricular diastolic function could not be evaluated due to atrial fibrillation. Left ventricular diastolic function could not be evaluated. Normal left ventricular filling pressure.  Right Ventricle: Prominent moderator band of no clinical significance. The right ventricular size is normal. No increase in right ventricular wall thickness. Right ventricular systolic function is mildly reduced. There is mildly elevated pulmonary artery systolic pressure. The tricuspid regurgitant velocity is 2.32 m/s, and with an  Cardiology Office Note:  .   Date:  10/21/2022  ID:  Samuel Barnes, DOB 1950-08-12, MRN [id number] PCP: [name], PA  [facility] Providers Cardiologist:  Armanda Magic, MD    History of Present Illness: .   DEVAUNTE GASPARINI is a 72 y.o. male  with a hx of CHF, chronic hypoxic respiratory failure on 3L O2, atrial fibrillation on Eliquis, BPH, CAD, HLD, tobacco use, obesity, depression, alcohol abuse.   Admitted 07/28/2020 - 08/08/2020 with acute on chronic diastolic heart failure, paroxysmal new onset atrial fibrillation, and pleural effusion.  No prior cardiac imaging or ischemic testing coronary artery calcifications on previous CT scan from 2019.  Underwent right thoracentesis centesis.TEE 08/02/20 LVEF 55-60%, no left atrial appendage thrombus. DCCV subsequently performed. Only brief restoration of NSR. He was diuresed with IV Lasix 9 litersand discharged on p.o. Lasix.   He was admitted 08/14/20-08/21/20 with Wernicke's encephalopathy, syncope, metabolic encephalopathy. CT/MRI brain unremarkable.    Admitted 09/27/20-10/04/20 after near syncope and fall. He was diagnosed with ICH. CT head with 7mm acute parenchymal bleed with surrounding edema within left ocipital lobe. 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  Underwent angiography showing "a prominently dilated, early draining vein in the left occipital region, draining into the internal cerebral vein, consistent with shunting.  No clear liters seen.  The pain is flow is anterograde." He was cleared to resume Eliquis on discharge. Given bradycardia, Toprol further reduced to 25mg  QD. Low dose Amlodipine was added for blood pressure control.   Cardioversion performed 10/31/20 was unsuccessful. Seen 11/14/20 had self converted to NSR. Admitted 12/20-12/22/22 for ligation of AV fistula.   Last seen 07/23/21 by Dr. Mayford Knife doing well with no changes made at that  time.    He presents today for follow up with his daughter. Does get winded with more than usual activity which is stable at baseline. One episode of lightheadedness when he got out of the car and was walking up the hill to the house.  No near STEMI, syncope and the sepsis was very short-lived.  We reviewed orthostatic precautions.  He is uncertain whether he is taking amiodarone or not and will check his pill bottles at home.  ROS: Please see the history of present illness.    All other systems reviewed and are negative.   Studies Reviewed: .        Cardiac Studies & Procedures     STRESS TESTS  NM MYOCAR MULTI W/SPECT W 07/30/2020  Narrative CLINICAL DATA:  Atrial fibrillation.  EXAM: MYOCARDIAL IMAGING WITH SPECT (REST AND PHARMACOLOGIC-STRESS)  GATED LEFT VENTRICULAR WALL MOTION STUDY  LEFT VENTRICULAR EJECTION FRACTION  TECHNIQUE: Standard myocardial SPECT imaging was performed after resting intravenous injection of 10.7 mCi Tc-3m tetrofosmin. Subsequently, intravenous infusion of Lexiscan was performed under the supervision of the Cardiology staff. At peak effect of the drug, 31.0 mCi Tc-69m tetrofosmin was injected intravenously and standard myocardial SPECT imaging was performed. Quantitative gated imaging was also performed to evaluate left ventricular wall motion, and estimate left ventricular ejection fraction.  COMPARISON:  None.  FINDINGS: Perfusion: Fixed defect is seen involving inferior septal myocardium toward the apex. No definite reversibility is noted.  Wall Motion: Normal left ventricular wall motion. No left ventricular dilation.  Left Ventricular Ejection Fraction: 65 %  End diastolic volume 86 ml  End systolic volume 30 ml  IMPRESSION: 1. Fixed defect seen involving inferior 0  septal myocardium toward apex consistent with old infarction. No definite evidence of reversibility is noted to suggest acute ischemia.  2. Normal left ventricular  wall motion.  3. Left ventricular ejection fraction 65%  4. Non invasive risk stratification*: Low  *2012 Appropriate Use Criteria for Coronary Revascularization Focused Update: J Am Coll Cardiol. 2012;59(9):857-881. http://content.dementiazones.com.aspx?articleid=1201161   Electronically Signed By: Lupita Raider M.D. On: 07/30/2020 14:18   ECHOCARDIOGRAM  ECHOCARDIOGRAM COMPLETE 07/29/2020  Narrative ECHOCARDIOGRAM REPORT    Patient Name:   Samuel Barnes Date of Exam: 07/29/2020 Medical Rec #:  [id number]             Height:       68.0 in Accession #:    [id number]            Weight:       231.3 lb Date of Birth:  1950/10/03             BSA:          2.174 m Patient Age:    72 years              BP:           139/80 mmHg Patient Gender: M                     HR:           68 bpm. Exam Location:  Inpatient  Procedure: 2D Echo, Cardiac Doppler, Color Doppler and Intracardiac Opacification Agent  Indications:    CHF Atrial fibrillation  History:        Patient has no prior history of Echocardiogram examinations. CAD; Risk Factors:Dyslipidemia, Hypertension and Current Smoker. ETOH use.  Sonographer:    Ross Ludwig RDCS (AE) Referring Phys: [id number] DAVID Kelby Fam ORTIZ   Sonographer Comments: Patient is morbidly obese and Technically difficult study due to poor echo windows. Image acquisition challenging due to patient body habitus. IMPRESSIONS   1. Left ventricular ejection fraction, by estimation, is 55 to 60%. The left ventricle has normal function. The left ventricle has no regional wall motion abnormalities. Left ventricular diastolic function could not be evaluated. 2. Prominent moderator band of no clinical significance. Right ventricular systolic function is mildly reduced. The right ventricular size is normal. There is mildly elevated pulmonary artery systolic pressure. The estimated right ventricular systolic pressure is 36.5 mmHg. 3. The mitral  valve is normal in structure. No evidence of mitral valve regurgitation. No evidence of mitral stenosis. 4. The aortic valve is normal in structure. Aortic valve regurgitation is not visualized. No aortic stenosis is present. 5. The inferior vena cava is dilated in size with <50% respiratory variability, suggesting right atrial pressure of 15 mmHg.  FINDINGS Left Ventricle: Left ventricular ejection fraction, by estimation, is 55 to 60%. The left ventricle has normal function. The left ventricle has no regional wall motion abnormalities. Definity contrast agent was given IV to delineate the left ventricular endocardial borders. The left ventricular internal cavity size was normal in size. There is no left ventricular hypertrophy. Left ventricular diastolic function could not be evaluated due to atrial fibrillation. Left ventricular diastolic function could not be evaluated. Normal left ventricular filling pressure.  Right Ventricle: Prominent moderator band of no clinical significance. The right ventricular size is normal. No increase in right ventricular wall thickness. Right ventricular systolic function is mildly reduced. There is mildly elevated pulmonary artery systolic pressure. The tricuspid regurgitant velocity is 2.32 m/s, and with an  septal myocardium toward apex consistent with old infarction. No definite evidence of reversibility is noted to suggest acute ischemia.  2. Normal left ventricular  wall motion.  3. Left ventricular ejection fraction 65%  4. Non invasive risk stratification*: Low  *2012 Appropriate Use Criteria for Coronary Revascularization Focused Update: J Am Coll Cardiol. 2012;59(9):857-881. http://content.dementiazones.com.aspx?articleid=1201161   Electronically Signed By: Lupita Raider M.D. On: 07/30/2020 14:18   ECHOCARDIOGRAM  ECHOCARDIOGRAM COMPLETE 07/29/2020  Narrative ECHOCARDIOGRAM REPORT    Patient Name:   Samuel Barnes Date of Exam: 07/29/2020 Medical Rec #:  [id number]             Height:       68.0 in Accession #:    [id number]            Weight:       231.3 lb Date of Birth:  1950/10/03             BSA:          2.174 m Patient Age:    72 years              BP:           139/80 mmHg Patient Gender: M                     HR:           68 bpm. Exam Location:  Inpatient  Procedure: 2D Echo, Cardiac Doppler, Color Doppler and Intracardiac Opacification Agent  Indications:    CHF Atrial fibrillation  History:        Patient has no prior history of Echocardiogram examinations. CAD; Risk Factors:Dyslipidemia, Hypertension and Current Smoker. ETOH use.  Sonographer:    Ross Ludwig RDCS (AE) Referring Phys: [id number] DAVID Kelby Fam ORTIZ   Sonographer Comments: Patient is morbidly obese and Technically difficult study due to poor echo windows. Image acquisition challenging due to patient body habitus. IMPRESSIONS   1. Left ventricular ejection fraction, by estimation, is 55 to 60%. The left ventricle has normal function. The left ventricle has no regional wall motion abnormalities. Left ventricular diastolic function could not be evaluated. 2. Prominent moderator band of no clinical significance. Right ventricular systolic function is mildly reduced. The right ventricular size is normal. There is mildly elevated pulmonary artery systolic pressure. The estimated right ventricular systolic pressure is 36.5 mmHg. 3. The mitral  valve is normal in structure. No evidence of mitral valve regurgitation. No evidence of mitral stenosis. 4. The aortic valve is normal in structure. Aortic valve regurgitation is not visualized. No aortic stenosis is present. 5. The inferior vena cava is dilated in size with <50% respiratory variability, suggesting right atrial pressure of 15 mmHg.  FINDINGS Left Ventricle: Left ventricular ejection fraction, by estimation, is 55 to 60%. The left ventricle has normal function. The left ventricle has no regional wall motion abnormalities. Definity contrast agent was given IV to delineate the left ventricular endocardial borders. The left ventricular internal cavity size was normal in size. There is no left ventricular hypertrophy. Left ventricular diastolic function could not be evaluated due to atrial fibrillation. Left ventricular diastolic function could not be evaluated. Normal left ventricular filling pressure.  Right Ventricle: Prominent moderator band of no clinical significance. The right ventricular size is normal. No increase in right ventricular wall thickness. Right ventricular systolic function is mildly reduced. There is mildly elevated pulmonary artery systolic pressure. The tricuspid regurgitant velocity is 2.32 m/s, and with an  215 msec    TR Vmax:        232.00 cm/s MV E velocity: 93.80 cm/s SHUNTS Systemic VTI:  0.14 m Systemic Diam: 2.00 cm  Armanda Magic MD Electronically signed by Armanda Magic MD Signature Date/Time: 07/30/2020/9:43:47 AM    Final   TEE  ECHO TEE 08/02/2020  Narrative TRANSESOPHOGEAL ECHO REPORT    Patient Name:   Samuel Barnes Date of Exam: 08/02/2020 Medical Rec #:  [id number]             Height:       68.0 in Accession #:    [id number]            Weight:       215.2 lb Date of Birth:  03-May-1950             BSA:          2.108 m Patient Age:    72 years              BP:           144/77 mmHg Patient Gender: M                     HR:           48 bpm. Exam Location:  Inpatient  Procedure: Transesophageal Echo, 3D Echo, Cardiac Doppler and Color Doppler  Indications:     I48.1 Persistent atrial fibrillation  History:         Patient has prior history of Echocardiogram examinations, most recent 07/30/2020. Risk Factors:Hypertension, Dyslipidemia and Current Smoker. Acute hypoxic respiratory failure likely 2/2 acute CHF. ETOH Abuse.  Sonographer:     Leta Jungling RDCS Referring Phys:  [id number] Beatriz Stallion Diagnosing Phys: Laurance Flatten MD  PROCEDURE: After discussion of the risks and benefits of a TEE, an informed consent was obtained from the patient. The transesophogeal probe was passed without difficulty through the esophogus of the patient. Imaged were obtained with the patient in a left lateral decubitus position. Sedation performed by different physician. The patient was monitored while under deep sedation. Anesthestetic sedation was provided intravenously by Anesthesiology: 225.92mg  of Propofol, 30mg  of Lidocaine. Image quality was good. The patient's vital signs; including heart rate, blood pressure, and oxygen saturation; remained stable throughout the procedure. The patient developed no complications during the procedure.  IMPRESSIONS   1. Left ventricular ejection fraction, by estimation, is 55 to 60%. The left ventricle has normal function. 2. Right ventricular systolic function is normal. The right ventricular  size is normal. 3. Left atrial size was mild to moderately dilated. No left atrial/left atrial appendage thrombus was detected. 4. Right atrial size was moderately dilated. 5. The mitral valve is normal in structure. Mild mitral valve regurgitation. No evidence of mitral stenosis. 6. The aortic valve is tricuspid. There is mild calcification of the aortic valve. There is mild thickening of the aortic valve. Aortic valve regurgitation is not visualized. Mild aortic valve sclerosis is present, with no evidence of aortic valve stenosis. 7. There is Moderate (Grade III) plaque. 8. Successful DCCV performed (200J x1) after TEE with return to NSR. Please see separate note.  FINDINGS Left Ventricle: Left ventricular ejection fraction, by estimation, is 55 to 60%. The left ventricle has normal function. The left ventricular internal cavity size was normal in size.  Right Ventricle: The right ventricular size is normal. No increase in right ventricular wall thickness. Right ventricular systolic function is normal.

## 2022-10-21 NOTE — Telephone Encounter (Signed)
He has both of them, the amlodipine will need refills. The amiodarone he has two bottles. He would like refills set up both through [facility]. Rx updated and sent! NP notified      "Check your pill bottles for Amiodarone and Amlodipine. We will call you this afternoon to check in on which of these two (or both) you are taking. "

## 2022-10-21 NOTE — Patient Instructions (Addendum)
Medication Instructions:  Your physician recommends that you continue on your current medications as directed. Please refer to the Current Medication list given to you today.  Check your pill bottles for Amiodarone and Amlodipine. We will call you this afternoon to check in on which of these two (or both) you are taking.   Testing/Procedures: You may schedule for you Carotid for 09/2023 today if you would like!    Follow-Up: At [facility], you and your health needs are our priority.  As part of our continuing mission to provide you with exceptional heart care, we have created designated Provider Care Teams.  These Care Teams include your primary Cardiologist (physician) and Advanced Practice Providers (APPs -  Physician Assistants and Nurse Practitioners) who all work together to provide you with the care you need, when you need it.  We recommend signing up for the patient portal called "MyChart".  Sign up information is provided on this After Visit Summary.  MyChart is used to connect with patients for Virtual Visits (Telemedicine).  Patients are able to view lab/test results, encounter notes, upcoming appointments, etc.  Non-urgent messages can be sent to your provider as well.   To learn more about what you can do with MyChart, go to [email or web address].    Your next appointment:   1 year with Dr. Mayford Knife or Gillian Shields, NP   Other Instructions We have given your information on the Watchman device.

## 2022-11-07 ENCOUNTER — Telehealth (HOSPITAL_BASED_OUTPATIENT_CLINIC_OR_DEPARTMENT_OTHER): Payer: Self-pay

## 2022-11-07 NOTE — Telephone Encounter (Signed)
Received patient assistance determination, patient does not qualify due to not having met 3% OOP.  Patient notified, he is going to check with [facility] to see what they might charge him. He will followup with [place] regarding his decision. If no better pricing he is open to alternative.

## 2022-11-15 IMAGING — CT CT HEAD W/O CM
3 series · 15 of 47 positions shown, 18 images · non-contrast
Comparison: 12/23/2020 MRI.  09/29/2020 CT.

CLINICAL DATA: Head injury 6 weeks ago with laceration to the back
of the head. History of arteriovenous malformation.



[Series 2: head wo · axial · 0.45mm/px · z∈[-142,+3]mm · 9 of 35 slices shown, 12 images]
[im 3/35  brain]
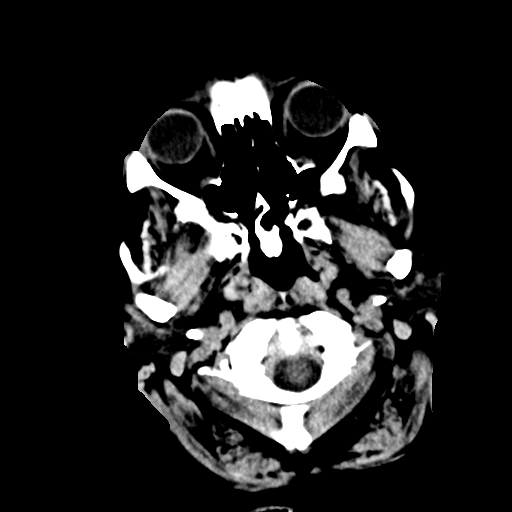
[im 3/35  bone]
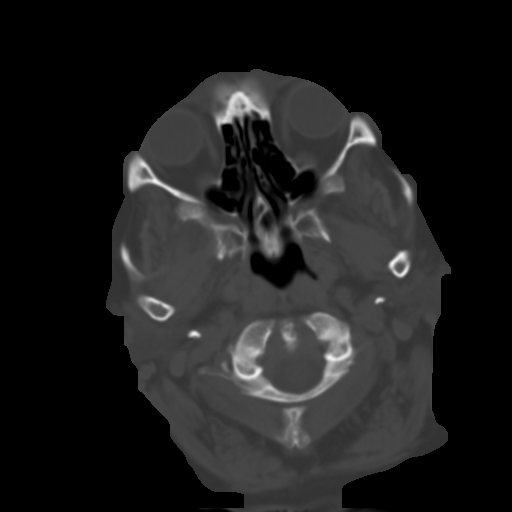
[im 6/35  brain]
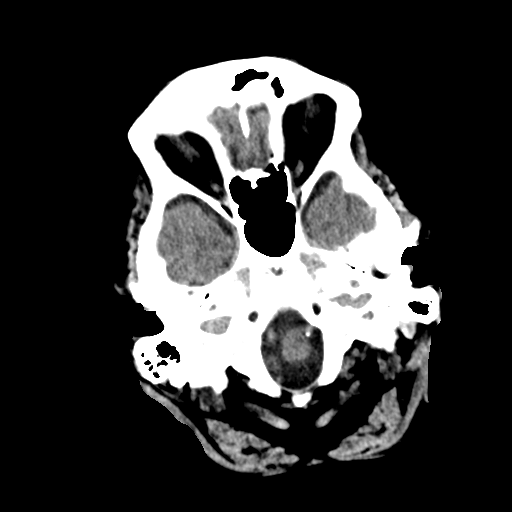
[im 10/35  brain]
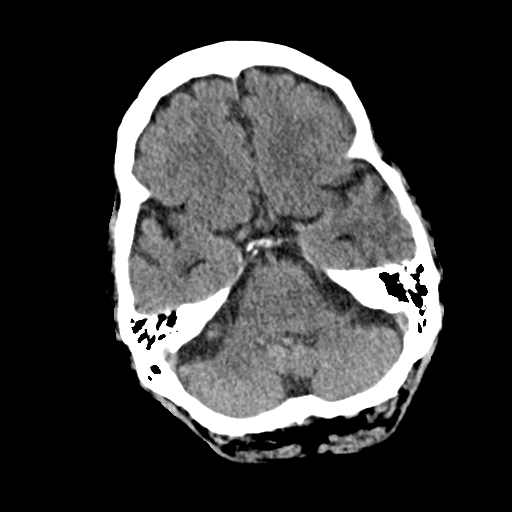
[im 13/35  brain]
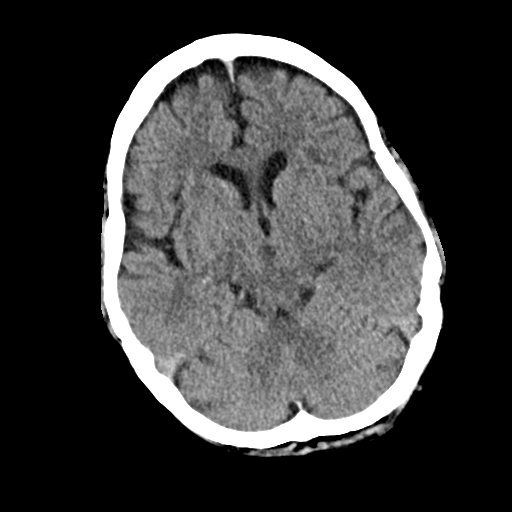
[im 18/35  brain]
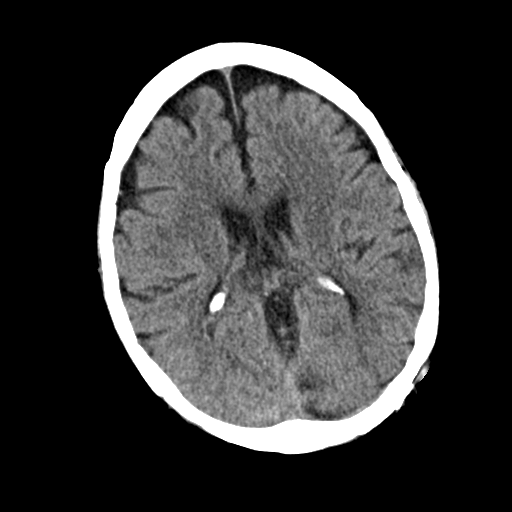
[im 18/35  bone]
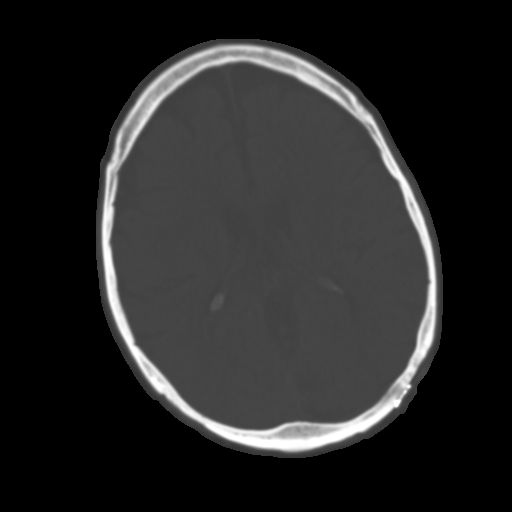
[im 22/35  brain]
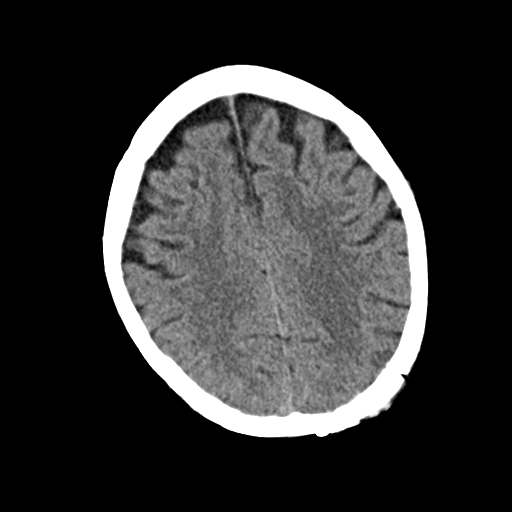
[im 25/35  brain]
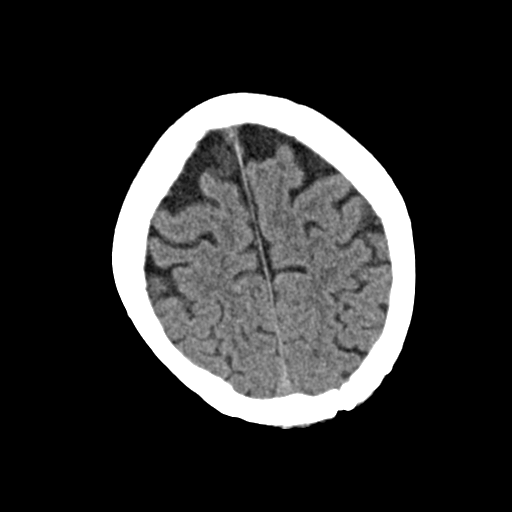
[im 29/35  brain]
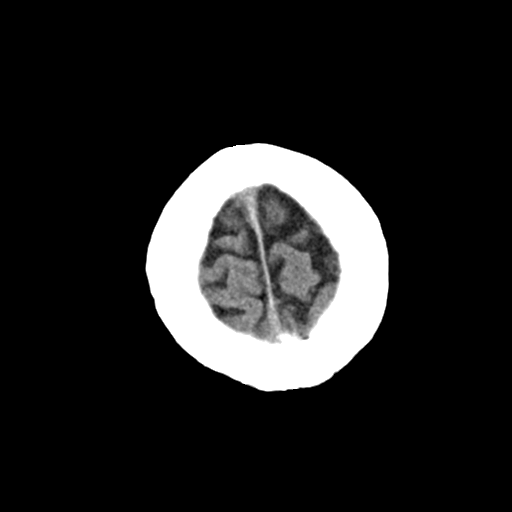
[im 32/35  brain]
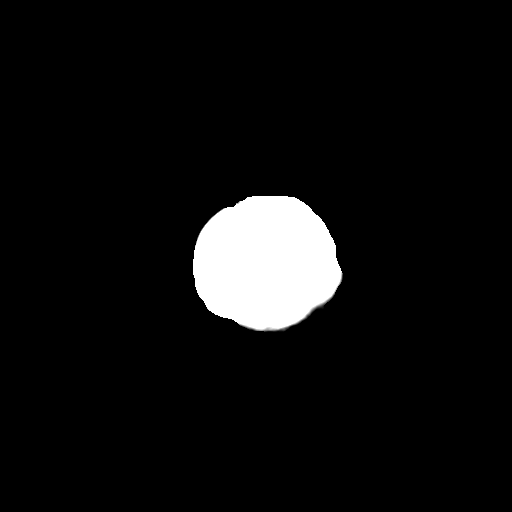
[im 32/35  bone]
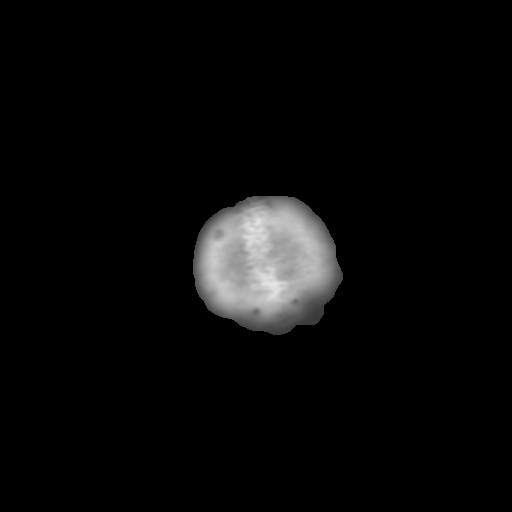

[Series 4: coronal soft · coronal · 0.35mm/px · 3 of 73 slices shown]
[im 25/73  brain]
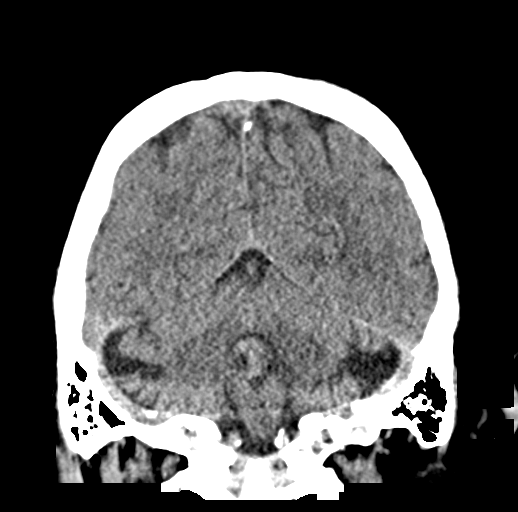
[im 33/73  brain]
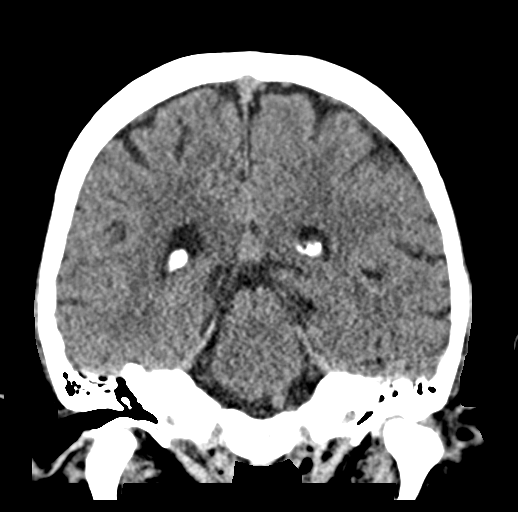
[im 41/73  brain]
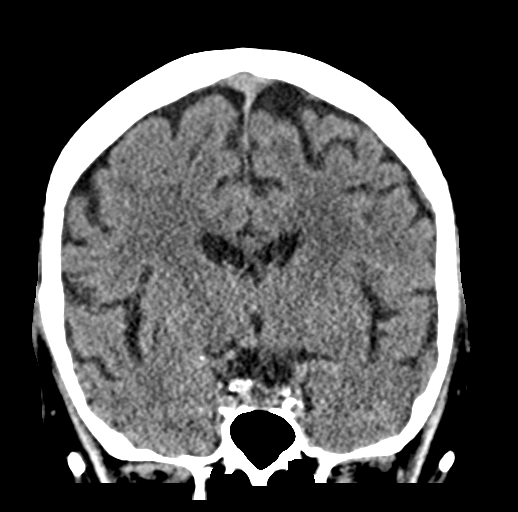

[Series 5: sag soft · sagittal · 0.34mm/px · 3 of 57 slices shown]
[im 19/57  brain]
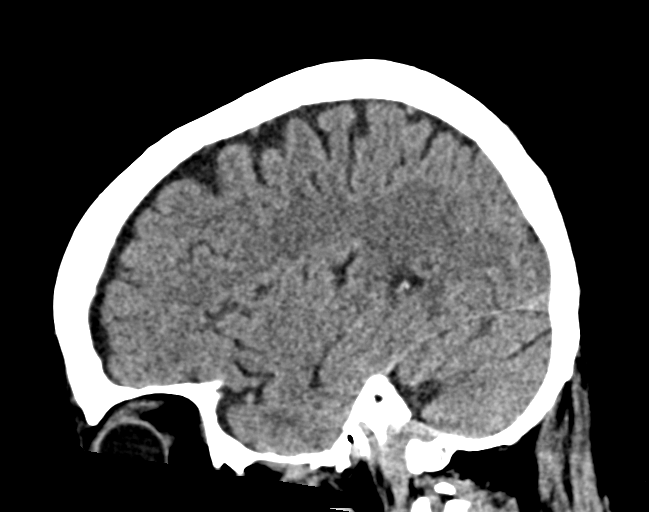
[im 29/57  brain]
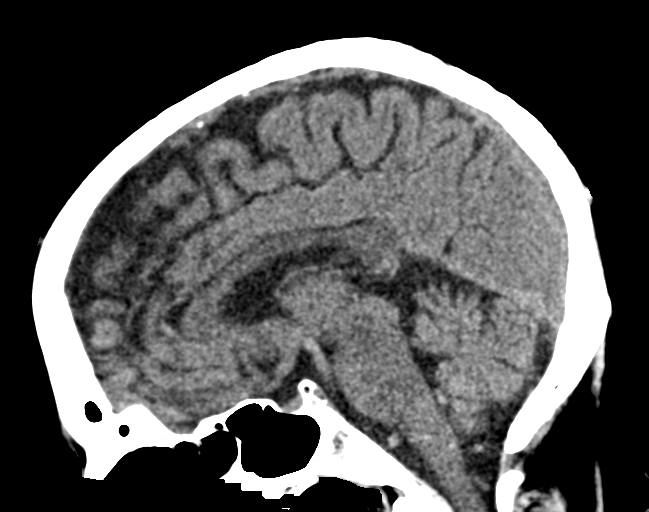
[im 38/57  brain]
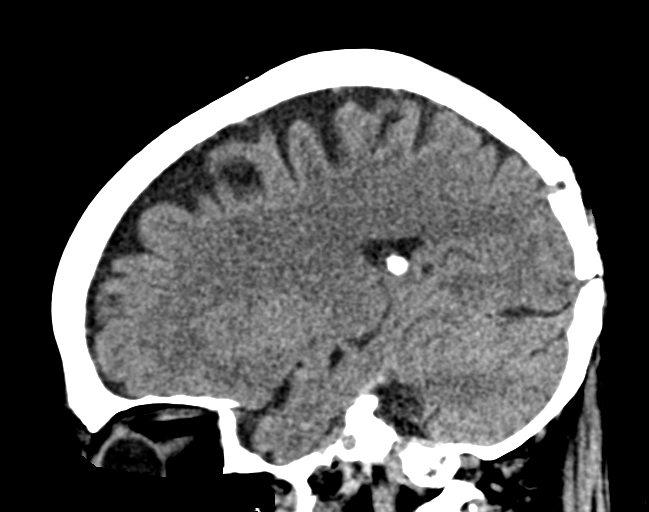

[15 of 47 positions shown; findings below may reference images not displayed]

FINDINGS: Brain: Since the previous exams, the patient has had left
parieto-occipital craniotomy. There is mild low-density in the left
occipital lobe consistent with postoperative change. There is no
sign of acute infarction, hemorrhage, hydrocephalus or extra-axial
collection. No acute traumatic finding.

Vascular: No abnormal vascular finding by plain CT other than
atherosclerotic calcification of the major vessels at the base of
the brain.

Skull: Left parieto-occipital craniotomy as above.

Sinuses/Orbits: Clear/normal

Other: None
IMPRESSION: No acute or traumatic finding. Previous left parietal/occipital
craniotomy. Mild low-density in the left occipital lobe, presumably
subsequent to the interval surgery. No sign of acute hemorrhage or
extra-axial collection.

## 2022-11-22 ENCOUNTER — Telehealth (HOSPITAL_BASED_OUTPATIENT_CLINIC_OR_DEPARTMENT_OTHER): Payer: Self-pay | Admitting: Family

## 2022-11-22 DIAGNOSIS — I480 Paroxysmal atrial fibrillation: Secondary | ICD-10-CM

## 2022-11-22 MED ORDER — APIXABAN 5 MG PO TABS
5.0000 mg | ORAL_TABLET | Freq: Two times a day (BID) | ORAL | 1 refills | Status: DC
Start: 2022-11-22 — End: 2023-01-28

## 2022-11-22 NOTE — Telephone Encounter (Signed)
Prescription refill request for Eliquis received. Indication: Afib  Last office visit: 10/21/22 Dan Humphreys)  Scr: 0.78 (08/19/22)  Age: 72 Weight: 97kg  Appropriate dose. Refill sent.

## 2022-11-22 NOTE — Telephone Encounter (Signed)
*  STAT* If patient is at the pharmacy, call can be transferred to refill team.   1. Which medications need to be refilled? (please list name of each medication and dose if known) Eliquis   2. Would you like to learn more about the convenience, safety, & potential cost savings by using the [facility]?      3. Are you open to using the [facility] (Type [facility].    4. Which pharmacy/location (including street and city if local pharmacy) is medication to be sent to? [facility]   5. Do they need a 30 day or 90 day supply? 90 days and refills

## 2023-01-27 ENCOUNTER — Other Ambulatory Visit (HOSPITAL_BASED_OUTPATIENT_CLINIC_OR_DEPARTMENT_OTHER): Payer: Self-pay | Admitting: Cardiology

## 2023-01-27 DIAGNOSIS — I480 Paroxysmal atrial fibrillation: Secondary | ICD-10-CM

## 2023-01-28 NOTE — Telephone Encounter (Signed)
Prescription refill request for Eliquis received. Indication:afib Last office visit:10/24 Scr:0.78  8/24 Age: 73 Weight:97  kg  Prescription refilled

## 2023-03-12 ENCOUNTER — Telehealth: Payer: Self-pay | Admitting: Family

## 2023-03-12 MED ORDER — APIXABAN 5 MG PO TABS: 5.0000 mg | ORAL_TABLET | Freq: Two times a day (BID) | ORAL | 1 refills | Status: DC

## 2023-03-12 NOTE — Addendum Note (Signed)
 Addended by: Satira Sark on: 03/12/2023 01:41 PM   Modules accepted: Orders

## 2023-03-12 NOTE — Telephone Encounter (Signed)
*  STAT* If patient is at the pharmacy, call can be transferred to refill team.   1. Which medications need to be refilled? (please list name of each medication and dose if known) apixaban (ELIQUIS) 5 MG TABS tablet   2. Which pharmacy/location (including street and city if local pharmacy) is medication to be sent to?  [facility] - [place], [place] - [address]      3. Do they need a 30 day or 90 day supply? 90 day    Pt is out of medication

## 2023-03-12 NOTE — Telephone Encounter (Signed)
 Pt last saw Gillian Shields, NP on 10/21/22, last labs 08/19/22 Creat 0.78, age 73, weight 97kg, based on specified criteria pt is on appropriate dosage of Eliquis 5mg  BID for afib.  Will refill rx.

## 2023-03-18 ENCOUNTER — Other Ambulatory Visit: Payer: Self-pay

## 2023-03-18 ENCOUNTER — Telehealth: Payer: Self-pay | Admitting: Cardiology

## 2023-03-18 NOTE — Telephone Encounter (Signed)
*  STAT* If patient is at the pharmacy, call can be transferred to refill team.   1. Which medications need to be refilled? (please list name of each medication and dose if known) apixaban (ELIQUIS) 5 MG TABS tablet   2. Which pharmacy/location (including street and city if local pharmacy) is medication to be sent to? [facility] - [place], [place] - [address]    3. Do they need a 30 day or 90 day supply? 90 Per pt [facility] not [facility] Pt has enough to last to friday

## 2023-04-09 ENCOUNTER — Emergency Department (HOSPITAL_BASED_OUTPATIENT_CLINIC_OR_DEPARTMENT_OTHER)

## 2023-04-09 ENCOUNTER — Encounter (HOSPITAL_BASED_OUTPATIENT_CLINIC_OR_DEPARTMENT_OTHER): Payer: Self-pay

## 2023-04-09 ENCOUNTER — Ambulatory Visit: Payer: Self-pay | Admitting: Acute Care

## 2023-04-09 ENCOUNTER — Other Ambulatory Visit: Payer: Self-pay

## 2023-04-09 ENCOUNTER — Inpatient Hospital Stay (HOSPITAL_BASED_OUTPATIENT_CLINIC_OR_DEPARTMENT_OTHER)
Admission: EM | Admit: 2023-04-09 | Discharge: 2023-04-17 | DRG: 286 | Disposition: A | Discharge status: Home / Self Care | Attending: Internal Medicine | Admitting: Internal Medicine

## 2023-04-09 DIAGNOSIS — I272 Pulmonary hypertension, unspecified: Secondary | ICD-10-CM | POA: Diagnosis present

## 2023-04-09 DIAGNOSIS — F411 Generalized anxiety disorder: Secondary | ICD-10-CM | POA: Diagnosis present

## 2023-04-09 DIAGNOSIS — E876 Hypokalemia: Secondary | ICD-10-CM | POA: Diagnosis not present

## 2023-04-09 DIAGNOSIS — I5082 Biventricular heart failure: Secondary | ICD-10-CM | POA: Diagnosis present

## 2023-04-09 DIAGNOSIS — Z79890 Hormone replacement therapy (postmenopausal): Secondary | ICD-10-CM

## 2023-04-09 DIAGNOSIS — Z888 Allergy status to other drugs, medicaments and biological substances status: Secondary | ICD-10-CM

## 2023-04-09 DIAGNOSIS — Z8041 Family history of malignant neoplasm of ovary: Secondary | ICD-10-CM

## 2023-04-09 DIAGNOSIS — R7401 Elevation of levels of liver transaminase levels: Secondary | ICD-10-CM | POA: Diagnosis present

## 2023-04-09 DIAGNOSIS — I4819 Other persistent atrial fibrillation: Secondary | ICD-10-CM | POA: Diagnosis present

## 2023-04-09 DIAGNOSIS — F0394 Unspecified dementia, unspecified severity, with anxiety: Secondary | ICD-10-CM | POA: Diagnosis present

## 2023-04-09 DIAGNOSIS — R519 Headache, unspecified: Secondary | ICD-10-CM

## 2023-04-09 DIAGNOSIS — J9811 Atelectasis: Secondary | ICD-10-CM | POA: Diagnosis present

## 2023-04-09 DIAGNOSIS — E7800 Pure hypercholesterolemia, unspecified: Secondary | ICD-10-CM

## 2023-04-09 DIAGNOSIS — I2510 Atherosclerotic heart disease of native coronary artery without angina pectoris: Secondary | ICD-10-CM | POA: Diagnosis present

## 2023-04-09 DIAGNOSIS — N400 Enlarged prostate without lower urinary tract symptoms: Secondary | ICD-10-CM | POA: Diagnosis present

## 2023-04-09 DIAGNOSIS — I5043 Acute on chronic combined systolic (congestive) and diastolic (congestive) heart failure: Secondary | ICD-10-CM | POA: Diagnosis present

## 2023-04-09 DIAGNOSIS — J9601 Acute respiratory failure with hypoxia: Secondary | ICD-10-CM | POA: Diagnosis present

## 2023-04-09 DIAGNOSIS — I509 Heart failure, unspecified: Principal | ICD-10-CM

## 2023-04-09 DIAGNOSIS — I071 Rheumatic tricuspid insufficiency: Secondary | ICD-10-CM | POA: Diagnosis present

## 2023-04-09 DIAGNOSIS — Z881 Allergy status to other antibiotic agents status: Secondary | ICD-10-CM

## 2023-04-09 DIAGNOSIS — I110 Hypertensive heart disease with heart failure: Principal | ICD-10-CM | POA: Diagnosis present

## 2023-04-09 DIAGNOSIS — R55 Syncope and collapse: Secondary | ICD-10-CM | POA: Diagnosis present

## 2023-04-09 DIAGNOSIS — E871 Hypo-osmolality and hyponatremia: Secondary | ICD-10-CM | POA: Diagnosis present

## 2023-04-09 DIAGNOSIS — Z1152 Encounter for screening for COVID-19: Secondary | ICD-10-CM | POA: Diagnosis not present

## 2023-04-09 DIAGNOSIS — R001 Bradycardia, unspecified: Secondary | ICD-10-CM | POA: Diagnosis present

## 2023-04-09 DIAGNOSIS — Z87891 Personal history of nicotine dependence: Secondary | ICD-10-CM

## 2023-04-09 DIAGNOSIS — Z8249 Family history of ischemic heart disease and other diseases of the circulatory system: Secondary | ICD-10-CM

## 2023-04-09 DIAGNOSIS — E8809 Other disorders of plasma-protein metabolism, not elsewhere classified: Secondary | ICD-10-CM | POA: Diagnosis present

## 2023-04-09 DIAGNOSIS — I10 Essential (primary) hypertension: Secondary | ICD-10-CM | POA: Diagnosis not present

## 2023-04-09 DIAGNOSIS — Z7901 Long term (current) use of anticoagulants: Secondary | ICD-10-CM | POA: Diagnosis not present

## 2023-04-09 DIAGNOSIS — Z79899 Other long term (current) drug therapy: Secondary | ICD-10-CM

## 2023-04-09 DIAGNOSIS — E785 Hyperlipidemia, unspecified: Secondary | ICD-10-CM | POA: Diagnosis present

## 2023-04-09 DIAGNOSIS — I480 Paroxysmal atrial fibrillation: Secondary | ICD-10-CM | POA: Diagnosis not present

## 2023-04-09 DIAGNOSIS — R0602 Shortness of breath: Secondary | ICD-10-CM | POA: Diagnosis present

## 2023-04-09 DIAGNOSIS — E66811 Obesity, class 1: Secondary | ICD-10-CM | POA: Diagnosis present

## 2023-04-09 DIAGNOSIS — Z6832 Body mass index (BMI) 32.0-32.9, adult: Secondary | ICD-10-CM | POA: Diagnosis not present

## 2023-04-09 DIAGNOSIS — D649 Anemia, unspecified: Secondary | ICD-10-CM | POA: Diagnosis present

## 2023-04-09 DIAGNOSIS — J441 Chronic obstructive pulmonary disease with (acute) exacerbation: Secondary | ICD-10-CM | POA: Diagnosis present

## 2023-04-09 DIAGNOSIS — I5033 Acute on chronic diastolic (congestive) heart failure: Secondary | ICD-10-CM | POA: Diagnosis not present

## 2023-04-09 DIAGNOSIS — Z7984 Long term (current) use of oral hypoglycemic drugs: Secondary | ICD-10-CM

## 2023-04-09 DIAGNOSIS — I700 Atherosclerosis of aorta: Secondary | ICD-10-CM | POA: Diagnosis present

## 2023-04-09 DIAGNOSIS — Z800 Family history of malignant neoplasm of digestive organs: Secondary | ICD-10-CM

## 2023-04-09 DIAGNOSIS — Z83438 Family history of other disorder of lipoprotein metabolism and other lipidemia: Secondary | ICD-10-CM

## 2023-04-09 DIAGNOSIS — Z8673 Personal history of transient ischemic attack (TIA), and cerebral infarction without residual deficits: Secondary | ICD-10-CM

## 2023-04-09 LAB — CBC
HCT: 39.8 % (ref 39.0–52.0)
Hemoglobin: 12.7 g/dL — ABNORMAL LOW (ref 13.0–17.0)
MCH: 28.5 pg (ref 26.0–34.0)
MCHC: 31.9 g/dL (ref 30.0–36.0)
MCV: 89.4 fL (ref 80.0–100.0)
Platelets: 165 10*3/uL (ref 150–400)
RBC: 4.45 MIL/uL (ref 4.22–5.81)
RDW: 14.2 % (ref 11.5–15.5)
WBC: 4.2 10*3/uL (ref 4.0–10.5)
nRBC: 0 % (ref 0.0–0.2)

## 2023-04-09 LAB — RESP PANEL BY RT-PCR (RSV, FLU A&B, COVID)  RVPGX2
Influenza A by PCR: NEGATIVE
Influenza B by PCR: NEGATIVE
Resp Syncytial Virus by PCR: NEGATIVE
SARS Coronavirus 2 by RT PCR: NEGATIVE

## 2023-04-09 LAB — BRAIN NATRIURETIC PEPTIDE: B Natriuretic Peptide: 775.4 pg/mL — ABNORMAL HIGH (ref 0.0–100.0)

## 2023-04-09 LAB — BASIC METABOLIC PANEL WITH GFR
Anion gap: 9 (ref 5–15)
BUN: 15 mg/dL (ref 8–23)
CO2: 28 mmol/L (ref 22–32)
Calcium: 8.7 mg/dL — ABNORMAL LOW (ref 8.9–10.3)
Chloride: 95 mmol/L — ABNORMAL LOW (ref 98–111)
Creatinine, Ser: 0.6 mg/dL — ABNORMAL LOW (ref 0.61–1.24)
GFR, Estimated: >60 mL/min (ref >60)
Glucose, Bld: 135 mg/dL — ABNORMAL HIGH (ref 70–99)
Potassium: 3.9 mmol/L (ref 3.5–5.1)
Sodium: 132 mmol/L — ABNORMAL LOW (ref 135–145)

## 2023-04-09 LAB — GLUCOSE, CAPILLARY: Glucose-Capillary: 148 mg/dL — ABNORMAL HIGH (ref 70–99)

## 2023-04-09 LAB — TSH: TSH: 0.953 u[IU]/mL (ref 0.350–4.500)

## 2023-04-09 MED ORDER — ACETAMINOPHEN 325 MG PO TABS: 650.0000 mg | ORAL_TABLET | Freq: Four times a day (QID) | ORAL | Status: DC | PRN

## 2023-04-09 MED ORDER — METHYLPREDNISOLONE SODIUM SUCC 125 MG IJ SOLR
125.0000 mg | Freq: Once | INTRAMUSCULAR | Status: AC
  Administered 2023-04-09: 125 mg via INTRAVENOUS
  Filled 2023-04-09: qty 2

## 2023-04-09 MED ORDER — FUROSEMIDE 10 MG/ML IJ SOLN
20.0000 mg | Freq: Once | INTRAMUSCULAR | Status: AC
  Administered 2023-04-09: 20 mg via INTRAVENOUS
  Filled 2023-04-09: qty 2

## 2023-04-09 MED ORDER — ACETAMINOPHEN 650 MG RE SUPP: 650.0000 mg | Freq: Four times a day (QID) | RECTAL | Status: DC | PRN

## 2023-04-09 MED ORDER — AMLODIPINE BESYLATE 5 MG PO TABS
5.0000 mg | ORAL_TABLET | Freq: Every day | ORAL | Status: DC
  Administered 2023-04-10 – 2023-04-17 (×8): 5 mg via ORAL
  Filled 2023-04-09 (×8): qty 1

## 2023-04-09 MED ORDER — MELATONIN 3 MG PO TABS
3.0000 mg | ORAL_TABLET | Freq: Every evening | ORAL | Status: DC | PRN
  Administered 2023-04-10 – 2023-04-16 (×7): 3 mg via ORAL
  Filled 2023-04-09 (×7): qty 1

## 2023-04-09 MED ORDER — AMIODARONE HCL 200 MG PO TABS
200.0000 mg | ORAL_TABLET | Freq: Every day | ORAL | Status: DC
  Administered 2023-04-10 – 2023-04-14 (×5): 200 mg via ORAL
  Filled 2023-04-09 (×6): qty 1

## 2023-04-09 MED ORDER — ALBUTEROL SULFATE (2.5 MG/3ML) 0.083% IN NEBU: 2.5000 mg | INHALATION_SOLUTION | RESPIRATORY_TRACT | Status: DC | PRN

## 2023-04-09 MED ORDER — ALBUTEROL SULFATE HFA 108 (90 BASE) MCG/ACT IN AERS: 2.0000 | INHALATION_SPRAY | RESPIRATORY_TRACT | Status: DC | PRN

## 2023-04-09 MED ORDER — METHYLPREDNISOLONE SODIUM SUCC 125 MG IJ SOLR
80.0000 mg | Freq: Two times a day (BID) | INTRAMUSCULAR | Status: DC
  Administered 2023-04-10 – 2023-04-11 (×3): 80 mg via INTRAVENOUS
  Filled 2023-04-09 (×3): qty 2

## 2023-04-09 MED ORDER — METOPROLOL SUCCINATE ER 25 MG PO TB24
12.5000 mg | ORAL_TABLET | Freq: Every day | ORAL | Status: DC
  Administered 2023-04-11: 12.5 mg via ORAL
  Filled 2023-04-09 (×2): qty 1

## 2023-04-09 MED ORDER — CITALOPRAM HYDROBROMIDE 20 MG PO TABS
40.0000 mg | ORAL_TABLET | Freq: Every day | ORAL | Status: DC
  Administered 2023-04-10: 40 mg via ORAL
  Filled 2023-04-09: qty 2

## 2023-04-09 MED ORDER — IPRATROPIUM-ALBUTEROL 0.5-2.5 (3) MG/3ML IN SOLN
3.0000 mL | Freq: Four times a day (QID) | RESPIRATORY_TRACT | Status: DC
  Administered 2023-04-10 – 2023-04-11 (×3): 3 mL via RESPIRATORY_TRACT
  Filled 2023-04-09 (×5): qty 3

## 2023-04-09 MED ORDER — KETOROLAC TROMETHAMINE 15 MG/ML IJ SOLN
15.0000 mg | Freq: Once | INTRAMUSCULAR | Status: AC
  Administered 2023-04-09: 15 mg via INTRAVENOUS
  Filled 2023-04-09: qty 1

## 2023-04-09 MED ORDER — LOSARTAN POTASSIUM 25 MG PO TABS
25.0000 mg | ORAL_TABLET | Freq: Every day | ORAL | Status: DC
  Administered 2023-04-10 – 2023-04-17 (×8): 25 mg via ORAL
  Filled 2023-04-09 (×8): qty 1

## 2023-04-09 MED ORDER — FUROSEMIDE 10 MG/ML IJ SOLN
20.0000 mg | Freq: Two times a day (BID) | INTRAMUSCULAR | Status: DC
  Administered 2023-04-10: 20 mg via INTRAVENOUS
  Filled 2023-04-09: qty 2

## 2023-04-09 MED ORDER — PRAVASTATIN SODIUM 10 MG PO TABS
20.0000 mg | ORAL_TABLET | Freq: Every day | ORAL | Status: DC
  Administered 2023-04-10 – 2023-04-16 (×7): 20 mg via ORAL
  Filled 2023-04-09 (×7): qty 2

## 2023-04-09 MED ORDER — APIXABAN 5 MG PO TABS
5.0000 mg | ORAL_TABLET | Freq: Two times a day (BID) | ORAL | Status: DC
  Administered 2023-04-09 – 2023-04-17 (×16): 5 mg via ORAL
  Filled 2023-04-09 (×16): qty 1

## 2023-04-09 MED ORDER — IPRATROPIUM-ALBUTEROL 0.5-2.5 (3) MG/3ML IN SOLN
3.0000 mL | Freq: Once | RESPIRATORY_TRACT | Status: AC
  Administered 2023-04-09: 3 mL via RESPIRATORY_TRACT
  Filled 2023-04-09: qty 3

## 2023-04-09 MED ORDER — ONDANSETRON HCL 4 MG/2ML IJ SOLN: 4.0000 mg | Freq: Four times a day (QID) | INTRAMUSCULAR | Status: DC | PRN

## 2023-04-09 MED ORDER — AZITHROMYCIN 500 MG IV SOLR
500.0000 mg | INTRAVENOUS | Status: AC
  Administered 2023-04-09 – 2023-04-13 (×5): 500 mg via INTRAVENOUS
  Filled 2023-04-09 (×6): qty 5

## 2023-04-09 MED ORDER — MAGNESIUM SULFATE 2 GM/50ML IV SOLN
2.0000 g | Freq: Once | INTRAVENOUS | Status: AC
  Administered 2023-04-09: 2 g via INTRAVENOUS
  Filled 2023-04-09: qty 50

## 2023-04-09 MED ORDER — ACETAMINOPHEN 500 MG PO TABS
1000.0000 mg | ORAL_TABLET | Freq: Four times a day (QID) | ORAL | Status: DC | PRN
  Administered 2023-04-09 – 2023-04-17 (×5): 1000 mg via ORAL
  Filled 2023-04-09 (×6): qty 2

## 2023-04-09 MED ORDER — BUSPIRONE HCL 5 MG PO TABS
5.0000 mg | ORAL_TABLET | Freq: Two times a day (BID) | ORAL | Status: DC
  Administered 2023-04-09 – 2023-04-17 (×16): 5 mg via ORAL
  Filled 2023-04-09 (×16): qty 1

## 2023-04-09 MED ORDER — TERAZOSIN HCL 5 MG PO CAPS
5.0000 mg | ORAL_CAPSULE | Freq: Every day | ORAL | Status: DC
  Administered 2023-04-10 – 2023-04-17 (×8): 5 mg via ORAL
  Filled 2023-04-09 (×8): qty 1

## 2023-04-09 MED ORDER — MONTELUKAST SODIUM 10 MG PO TABS
10.0000 mg | ORAL_TABLET | Freq: Every day | ORAL | Status: DC
  Administered 2023-04-10 – 2023-04-17 (×8): 10 mg via ORAL
  Filled 2023-04-09 (×8): qty 1

## 2023-04-09 NOTE — ED Provider Notes (Addendum)
 [place] [facility] Provider Note   CSN: [id number] Arrival date & time: 04/09/23  1355     History  Chief Complaint  Patient presents with   Shortness of Breath    Samuel Barnes is a 73 y.o. male with medical history significant for intracerebral hemorrhage in 2022, atrial fibrillation, BPH, CHF, hypertension, COPD no longer smoking.  Patient presents to ED for evaluation of shortness of breath, lightheadedness on standing, headache.  Reports that for the last few weeks he has had shortness of breath, reports he always feels short of breath.  States that in the last week he has had worsening shortness of breath.  His wife at the bedside reports that she is able to audibly hear him wheezing sometimes.  He endorses lightheadedness on standing and with walking.  He denies any chest pain, leg swelling, nausea, vomiting, abdominal pain.  Denies any fevers, sick contacts, body aches or chills.  Also complaining of headache.  Reports a history of ICH in 2022 after a fall.  Denies any recent falls.  States he has had headaches recently that are controlled with Excedrin Migraine but headaches always return.  Denies a history of persistent headaches.  Denies one-sided weakness or numbness, visual deficits, neck pain.   Shortness of Breath Associated symptoms: no abdominal pain, no chest pain, no fever and no vomiting        Home Medications Prior to Admission medications   Medication Sig Start Date End Date Taking? Authorizing Provider  acetaminophen (TYLENOL) 500 MG tablet Take 1,000 mg by mouth every 6 (six) hours as needed (pain.).    [provider]  amiodarone (PACERONE) 200 MG tablet Take 1 tablet (200 mg total) by mouth daily. 10/21/22   Alver Sorrow, NP  amLODipine (NORVASC) 5 MG tablet Take 1 tablet (5 mg total) by mouth daily. 10/21/22   Alver Sorrow, NP  apixaban (ELIQUIS) 5 MG TABS tablet Take 1 tablet (5 mg total) by  mouth 2 (two) times daily. 03/12/23   [name], MD  busPIRone (BUSPAR) 5 MG tablet Take 5 mg by mouth 2 (two) times daily. 07/23/22   [provider]  citalopram (CELEXA) 40 MG tablet Take 40 mg by mouth daily. 07/19/21   [provider]  folic acid (FOLVITE) 1 MG tablet Take 1 tablet (1 mg total) by mouth daily. 08/09/20   Glade Lloyd, MD  furosemide (LASIX) 20 MG tablet Take 1 tablet (20 mg total) by mouth as needed. 02/22/21   [name], MD  ketoconazole (NIZORAL) 2 % cream Apply 1 application topically 2 (two) times daily. GROIN AREA 10/11/20   [provider]  ketorolac (ACULAR) 0.5 % ophthalmic solution  10/24/20   [provider]  losartan (COZAAR) 25 MG tablet Take 1 tablet (25 mg total) by mouth daily. Pt needs to make appt with provider to continue getting refills 10/21/22   Alver Sorrow, NP  lovastatin (MEVACOR) 20 MG tablet Take 1 tablet (20 mg total) by mouth daily. 10/21/22   Alver Sorrow, NP  metoprolol succinate (TOPROL-XL) 25 MG 24 hr tablet Take 1 tablet (25 mg total) by mouth daily. 10/21/22   Alver Sorrow, NP  montelukast (SINGULAIR) 10 MG tablet Take 10 mg by mouth daily.    [provider]  Multiple Vitamin (MULTIVITAMIN WITH MINERALS) TABS tablet Take 1 tablet by mouth daily. 08/09/20   Glade Lloyd, MD  nystatin cream (MYCOSTATIN) Apply  to affected area 2 times daily 08/19/22   Eber Hong, MD  polyethylene glycol (MIRALAX / GLYCOLAX) 17 g packet Take 17 g by mouth daily as needed for mild constipation. 08/21/20   Rolly Salter, MD  rivastigmine (EXELON) 3 MG capsule TAKE 1 CAPSULE (3 MG TOTAL) BY MOUTH 2 (TWO) TIMES DAILY. 08/27/21 05/24/22  Windell Norfolk, MD  terazosin (HYTRIN) 5 MG capsule Take 5 mg by mouth in the morning.    [provider]  thiamine (VITAMIN B-1) 100 MG tablet Take 100 mg by mouth daily. 01/21/21   [provider]  umeclidinium bromide (INCRUSE ELLIPTA) 62.5 MCG/ACT  AEPB Inhale 1 puff into the lungs daily. Patient not taking: Reported on 10/21/2022 10/17/21   Omar Person, MD      Allergies    Ciprofloxacin, Gabapentin, and Chlorhexidine gluconate    Review of Systems   Review of Systems  Constitutional:  Negative for fever.  Respiratory:  Positive for shortness of breath.   Cardiovascular:  Positive for leg swelling. Negative for chest pain.  Gastrointestinal:  Negative for abdominal pain, nausea and vomiting.  Neurological:  Positive for dizziness and light-headedness.  All other systems reviewed and are negative.   Physical Exam Updated Vital Signs BP (!) 164/75   Pulse (!) 50   Temp 97.7 F (36.5 C)   Resp 15   Ht 5' 8.5" (1.74 m)   Wt 99.8 kg   SpO2 96%   BMI 32.96 kg/m  Physical Exam Vitals and nursing note reviewed.  Constitutional:      General: He is not in acute distress.    Appearance: He is well-developed.  HENT:     Head: Normocephalic and atraumatic.  Eyes:     Conjunctiva/sclera: Conjunctivae normal.  Cardiovascular:     Rate and Rhythm: Normal rate and regular rhythm.     Heart sounds: No murmur heard. Pulmonary:     Effort: Pulmonary effort is normal. No respiratory distress.     Breath sounds: Wheezing present.     Comments: Slight expiratory wheeze Abdominal:     Palpations: Abdomen is soft.     Tenderness: There is no abdominal tenderness.  Musculoskeletal:        General: No swelling.     Cervical back: Neck supple.     Right lower leg: Edema present.     Left lower leg: Edema present.     Comments: 1+ pitting edema bilateral lower extremities  Skin:    General: Skin is warm and dry.     Capillary Refill: Capillary refill takes less than 2 seconds.  Neurological:     Mental Status: He is alert.  Psychiatric:        Mood and Affect: Mood normal.     ED Results / Procedures / Treatments   Labs (all labs ordered are listed, but only abnormal results are displayed) Labs Reviewed  BASIC  METABOLIC PANEL WITH GFR - Abnormal; Notable for the following components:      Result Value   Sodium 132 (*)    Chloride 95 (*)    Glucose, Bld 135 (*)    Creatinine, Ser 0.60 (*)    Calcium 8.7 (*)    All other components within normal limits  CBC - Abnormal; Notable for the following components:   Hemoglobin 12.7 (*)    All other components within normal limits  GLUCOSE, CAPILLARY - Abnormal; Notable for the following components:   Glucose-Capillary 148 (*)  All other components within normal limits  BRAIN NATRIURETIC PEPTIDE - Abnormal; Notable for the following components:   B Natriuretic Peptide 775.4 (*)    All other components within normal limits  RESP PANEL BY RT-PCR (RSV, FLU A&B, COVID)  RVPGX2  URINALYSIS, ROUTINE W REFLEX MICROSCOPIC  CBG MONITORING, ED    EKG EKG Interpretation Date/Time:  Wednesday April 09 2023 14:08:33 EDT Ventricular Rate:  49 PR Interval:  224 QRS Duration:  92 QT Interval:  394 QTC Calculation: 355 R Axis:   88  Text Interpretation: Sinus bradycardia with 1st degree A-V block Indeterminate axis Incomplete right bundle branch block Anteroseptal infarct , age undetermined Abnormal ECG No significant change since last tracing Confirmed by Melene Plan [phone]) on 04/09/2023 5:14:46 PM  Radiology CT Head Wo Contrast Result Date: 04/09/2023 CLINICAL DATA:  Headache, increasing frequency or severity. EXAM: CT HEAD WITHOUT CONTRAST TECHNIQUE: Contiguous axial images were obtained from the base of the skull through the vertex without intravenous contrast. RADIATION DOSE REDUCTION: This exam was performed according to the departmental dose-optimization program which includes automated exposure control, adjustment of the mA and/or kV according to patient size and/or use of iterative reconstruction technique. COMPARISON:  CT scan head from 02/01/2021. FINDINGS: Brain: No evidence of acute infarction, hemorrhage, hydrocephalus, extra-axial collection or mass  lesion/mass effect. There is bilateral periventricular hypodensity, which is non-specific but most likely seen in the settings of microvascular ischemic changes. Mild in extent. otherwise normal appearance of brain parenchyma. Ventricles are normal. Cerebral volume is age appropriate. Vascular: No hyperdense vessel or unexpected calcification. Intracranial arteriosclerosis. Skull: Normal. Negative for fracture or focal lesion. Redemonstration of postsurgical changes from prior left occipital parietal craniotomy. Sinuses/Orbits: No acute finding. Other: Visualized mastoid air cells are unremarkable. No mastoid effusion. IMPRESSION: *No acute intracranial abnormality. Electronically Signed   By: Jules Schick M.D.   On: 04/09/2023 16:08   DG Chest 2 View Result Date: 04/09/2023 CLINICAL DATA:  Shortness of breath. EXAM: CHEST - 2 VIEW COMPARISON:  07/20/2021. FINDINGS: There are atelectatic changes at the lung bases, right more than left. There is subtle blunting of right costophrenic angle, suggesting trace right pleural effusion. Bilateral lung fields are otherwise clear. Left costophrenic angle is clear. Stable cardio-mediastinal silhouette. No acute osseous abnormalities. Multiple old healed right posterolateral rib fractures noted. The soft tissues are within normal limits. IMPRESSION: Atelectatic changes at the lung bases, right more than left. Trace right pleural effusion. Electronically Signed   By: Jules Schick M.D.   On: 04/09/2023 16:05    Procedures .Critical Care  Performed by: Al Decant, PA-C Authorized by: Al Decant, PA-C   Critical care provider statement:    Critical care time (minutes):  55   Critical care time was exclusive of:  Separately billable procedures and treating other patients   Critical care was necessary to treat or prevent imminent or life-threatening deterioration of the following conditions:  Respiratory failure   Critical care was time spent  personally by me on the following activities:  Blood draw for specimens, development of treatment plan with patient or surrogate, discussions with consultants, discussions with primary provider, evaluation of patient's response to treatment, examination of patient, interpretation of cardiac output measurements, obtaining history from patient or surrogate, ordering and performing treatments and interventions, ordering and review of laboratory studies, ordering and review of radiographic studies, pulse oximetry, re-evaluation of patient's condition and review of old charts   I assumed direction of critical care for this  patient from another provider in my specialty: no     Care discussed with: admitting provider      Medications Ordered in ED Medications  ipratropium-albuterol (DUONEB) 0.5-2.5 (3) MG/3ML nebulizer solution 3 mL (3 mLs Nebulization Given 04/09/23 1458)  methylPREDNISolone sodium succinate (SOLU-MEDROL) 125 mg/2 mL injection 125 mg (125 mg Intravenous Given 04/09/23 1458)  magnesium sulfate IVPB 2 g 50 mL (0 g Intravenous Stopped 04/09/23 1625)  ketorolac (TORADOL) 15 MG/ML injection 15 mg (15 mg Intravenous Given 04/09/23 1458)  furosemide (LASIX) injection 20 mg (20 mg Intravenous Given 04/09/23 1647)    ED Course/ Medical Decision Making/ A&P Clinical Course as of 04/09/23 1733  Wed Apr 09, 2023  1634 Desats to 85% [CG]    Clinical Course User Index [CG] Al Decant, PA-C   Medical Decision Making Amount and/or Complexity of Data Reviewed Labs: ordered. Radiology: ordered.  Risk Prescription drug management. Decision regarding hospitalization.   73 year old male presents ED for evaluation.  Please see HPI for further details.  On examination the patient is afebrile, nontachycardic.  His lung sounds have slight expiratory wheeze bilaterally, his oxygen saturations 90% room air sitting in the bed.  Abdomen soft and compressible.  Neurological examinations at baseline.   1+ pitting edema to bilateral lower extremities also present.  Differential diagnosis includes CBD exacerbation, CHF exacerbation  CBC without leukocytosis, baseline hemoglobin.  Metabolic panel with sodium 132, creatinine 0.60, anion gap 9.  BNP elevated at 775.4.  Viral panel negative for all.  Chest x-ray shows minimal right pleural effusion.  EKG shows sinus rhythm.  CT head unremarkable.  CT head ordered due to the patient history of ICH, reports increasing headaches over the last month.  This study was unremarkable.  Patient given 125 Solu-Medrol, DuoNeb, magnesium sulfate for wheezing.  Given Toradol for headache.  Once patient BMP was resulted, patient was provided with 20 mg of Lasix.  Suspect patient is suffering from CHF exacerbation.  He was ambulated and dropped to 85% room air.  Patient requires admission due to hypoxia secondary to CHF exacerbation.  Admitted at this time to Dr. Natale Milch of the [facility] hospitalist service.  Patient amenable to plan.  Stable at time of admission.   Final Clinical Impression(s) / ED Diagnoses Final diagnoses:  Acute on chronic congestive heart failure, unspecified heart failure type (HCC)  Acute respiratory failure with hypoxia (HCC)  Nonintractable headache, unspecified chronicity pattern, unspecified headache type    Rx / DC Orders ED Discharge Orders     None            Al Decant, PA-C 04/09/23 1804    Ernie Avena, MD 04/12/23 1506

## 2023-04-09 NOTE — Telephone Encounter (Signed)
 Chief Complaint: SOB Symptoms: audible wheezing Frequency: x 1 week Pertinent Negatives: Patient denies fever, URI sx Disposition: [] ED /[] Urgent Care (no appt availability in office) / [] Appointment(In office/virtual)/ []  [place] [facility]/ [] Home Care/ [] Refused Recommended Disposition /[] [facility]/ []  Follow-up with PCP Additional Notes: Pt daughter, Lorene Dy, calling c/o SOB with audible wheezing x 1 week. Pt was heard in the background and reports that he has not taken respiratory INH for years d/t coughing. Of note, does not have rescue inhaler either. Based on audible wheezing reported by daughter and no rescue INH, triager advised ED for further evaluation/treatment. Patient Caregiver verbalized understanding and to call back/911 with worsening symptoms.    Copied from CRM [id number]. Topic: Clinical - Red Word Triage >> Apr 09, 2023  1:00 PM Adele Barthel wrote: [facility] that prompted transfer to Nurse Triage:   Difficulty breathing Short of breath No chest pain  Oxygen levels around 88 percent Symptoms for 1 week Reason for Disposition  Wheezing can be heard across the room  Answer Assessment - Initial Assessment Questions E2C2 Pulmonary Triage - Initial Assessment Questions "Chief Complaint (e.g., cough, sob, wheezing, fever, chills, sweat or additional symptoms) *Go to specific symptom protocol after initial questions. SOB, audible wheezing  "How long have symptoms been present?" X 1 week  Have you tested for COVID or Flu? Note: If not, ask patient if a home test can be taken. If so, instruct patient to call back for positive results. No  MEDICINES:   "Have you used any OTC meds to help with symptoms?" No If yes, ask "What medications?" N/a  "Have you used your inhalers/maintenance medication?" Yes If yes, "What medications?" Ellipta - no longer using - "its been years" Does not have rescue INH  If inhaler, ask "How many puffs and how often?" Note:  Review instructions on medication in the chart. See above  OXYGEN: "Do you wear supplemental oxygen?" No If yes, "How many liters are you supposed to use?" N/a  "Do you monitor your oxygen levels?" Yes If yes, "What is your reading (oxygen level) today?" 88% with exertion 94% at rest  "What is your usual oxygen saturation reading?"  (Note: Pulmonary O2 sats should be 90% or greater) Mid 90s   3. PATTERN "Does the difficult breathing come and go, or has it been constant since it started?"      constant 4. SEVERITY: "How bad is your breathing?" (e.g., mild, moderate, severe)    - MILD: No SOB at rest, mild SOB with walking, speaks normally in sentences, can lie down, no retractions, pulse < 100.    - MODERATE: SOB at rest, SOB with minimal exertion and prefers to sit, cannot lie down flat, speaks in phrases, mild retractions, audible wheezing, pulse 100-120.    - SEVERE: Very SOB at rest, speaks in single words, struggling to breathe, sitting hunched forward, retractions, pulse > 120      Mild-moderate Triager can appreciate pt speaking in background answering questions in full sentences. 5. RECURRENT SYMPTOM: "Have you had difficulty breathing before?" If Yes, ask: "When was the last time?" and "What happened that time?"      Reports past hospitalization that required D/C home with O2  6. CARDIAC HISTORY: "Do you have any history of heart disease?" (e.g., heart attack, angina, bypass surgery, angioplasty)      CHF - reports "picked up a few pounds over the winter" but has been pretty steady @ 220 lbs Reports has Lasix PRN  but has not needed to take recently BP 159/87 P 50 7. LUNG HISTORY: "Do you have any history of lung disease?"  (e.g., pulmonary embolus, asthma, emphysema)     COPD 8. CAUSE: "What do you think is causing the breathing problem?"      unknown 9. OTHER SYMPTOMS: "Do you have any other symptoms? (e.g., dizziness, runny nose, cough, chest pain, fever)     H/A -  tylenol with relief  Protocols used: Breathing Difficulty-A-AH

## 2023-04-09 NOTE — H&P (Signed)
 History and Physical      Samuel Barnes [facility] ZOX:[id number] DOB: [birth year] DOA: 04/09/2023; DOS: 04/09/2023  PCP: [name], PA  Patient coming from: home   I have personally briefly reviewed patient's old medical records in [facility]  Chief Complaint: Shortness of breath  HPI: Samuel Barnes is a 73 y.o. male with medical history significant for paroxysmal atrial fibrillation chronically anticoagulated on Eliquis, COPD, chronic diastolic heart failure, intracranial hemorrhage in September 2022, BPH, chronic hyponatremia with associated baseline sodium level in the range of 1 30-1 34, who is admitted to [facility] on 04/09/2023 by way of transfer from [facility] with suspected acute on chronic diastolic heart failure after presenting from home to latter facility complaining of shortness of breath.   The patient reports 1 week of progressive shortness of breath, that is worse while lying flat, and has been associated with new onset wheezing.  He has not noticed any significant increase in edema in the bilateral lower extremities, nor any recent calf tenderness or new lower extremity erythema.  He denies any associated chest pain, palpitations, diaphoresis, nor any recent hemoptysis.  He notes associated mild, nonproductive cough.  Not associate with any subjective fever, chills and rigors, or generalized myalgias.  His medical history is notable for chronic diastolic heart failure as well as chronic right-sided systolic heart failure, with most recent TTE performed in July 2022, which was notable for LVEF 55 to 60%, no evidence of focal wall motion values, indeterminate diastolic parameters, mildly reduced right ventricular systolic function, and no evidence of significant valvular pathology.  He conveys that he is on Lasix as an outpatient, but on a as needed basis only.  Not on a scheduled diuretic medications at home.  His cardiac history is also notable for  proximal atrial fibrillation for which she is chronically anticoagulated on Eliquis.  His medical history is also notable for COPD in the setting of being a former smoker.  Denies any known baseline supplemental oxygen requirements.  He was previously on [employer], although this was stopped approximately 6 months ago.  Not currently on any scheduled LABA or anticholinergic agents as an outpatient.    [facility] ED Course:  Vital signs in the ED were notable for the following: Afebrile; heart rates in the 40s to 50s; systolic blood pressures in the 140s to 160s; respiratory rate 15-18, oxygen saturation 90 to 96% on room air.  He was subsequent started on 2 L nasal cannula for patient comfort, with ensuing oxygen saturations noted to be 94% while on this 2 L nasal cannula.  Labs were notable for the following: BMP was notable for the following: Sodium 132 compared to most recent prior value 131 in August 2024, potassium 3.9, bicarbonate 28, creatinine 0.60 compared to 0.78 in August 2024, glucose 134.  BNP 775, relative to most recent prior BNP data point of 135 in August 2022.  CBC notable for white cell count 4200.  Urinalysis ordered, with result currently pending.  COVID, influenza, RSV PCR were all negative.  Per my interpretation, EKG in ED demonstrated the following: Interpretation of EKG was limited by the presence of motion artifact.  However, within these confines, appears to show sinus bradycardia with first-degree AV block with PR interval 224, heart rate 49, QTc of 355, and no evidence of T wave or ST changes, including no evidence of ST elevation.  Imaging in the ED, per corresponding formal radiology read,  was notable for the following: 2 view chest x-ray showed bibasilar atelectasis, trace right pleural effusion, but demonstrating no evidence of infiltrate, edema, or pneumothorax.  CT head without contrast showed no evidence of acute intracranial process, including no  evidence of intracranial injury evidence of acute infarct.  While in the ED, the following were administered: Lasix 20 mg IV x 1 dose, duo nebulizer treatment x 1, Toradol 15 mg IV x 1, Solu-Medrol 125 mg IV x 1, magnesium sulfate 2 g IV over 2 hours.  Subsequently, the patient was admitted for further evaluation and management of suspected acute on chronic diastolic heart failure, with concern for additional element of acute COPD exacerbation.     Review of Systems: As per HPI otherwise 10 point review of systems negative.   Past Medical History:  Diagnosis Date   Anxiety    Aortic atherosclerosis (HCC) 07/28/2020   Atrial fibrillation (HCC)    BPH (benign prostatic hyperplasia)    Carotid artery stenosis, asymptomatic, bilateral    1 to 39% right ICA stenosis and 40 to 59% left ICA stenosis by Dopplers 09/2022   CHF (congestive heart failure) (HCC)    Class 2 obesity 07/28/2020   COPD (chronic obstructive pulmonary disease) (HCC)    Coronary artery calcification 07/28/2020   Dementia (HCC)    Depression    Family history of adverse reaction to anesthesia    Hyperlipidemia 07/28/2020   Hypertension    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   Tobacco use 07/28/2020    Past Surgical History:  Procedure Laterality Date   APPLICATION OF CRANIAL NAVIGATION N/A 12/26/2020   Procedure: APPLICATION OF CRANIAL NAVIGATION;  Surgeon: [name], MD;  Location: [facility] OR;  Service: Neurosurgery;  Laterality: N/A;  RM 21   CARDIOVERSION N/A 08/02/2020   Procedure: CARDIOVERSION;  Surgeon: Meriam Sprague, MD;  Location: [facility] ENDOSCOPY;  Service: Cardiovascular;  Laterality: N/A;   CARDIOVERSION N/A 10/31/2020   Procedure: CARDIOVERSION;  Surgeon: Thomasene Ripple, DO;  Location: [facility] ENDOSCOPY;  Service: Cardiovascular;  Laterality: N/A;   CATARACT EXTRACTION Bilateral 11/2020   CRANIOTOMY Left 12/26/2020   Procedure: Left  Subocciptal Cranitomy;  Surgeon: [name], MD;  Location: [facility] OR;  Service: Neurosurgery;  Laterality: Left;   IR ANGIO INTRA EXTRACRAN SEL COM CAROTID INNOMINATE BILAT MOD SED  10/04/2020   IR ANGIO VERTEBRAL SEL SUBCLAVIAN INNOMINATE BILAT MOD SED  10/03/2020   IR THORACENTESIS ASP PLEURAL SPACE W/IMG GUIDE  07/31/2020   IR US GUIDE VASC ACCESS RIGHT  10/04/2020   TEE WITHOUT CARDIOVERSION N/A 08/02/2020   Procedure: TRANSESOPHAGEAL ECHOCARDIOGRAM (TEE);  Surgeon: Meriam Sprague, MD;  Location: [facility] ENDOSCOPY;  Service: Cardiovascular;  Laterality: N/A;   TRANSURETHRAL RESECTION OF PROSTATE      Social History:  reports that he quit smoking about 2 years ago. His smoking use included cigarettes. He started smoking about 56 years ago. He has a 82.5 pack-year smoking history. He has never used smokeless tobacco. He reports that he does not currently use alcohol after a past usage of about 1.0 standard drink of alcohol per week. He reports that he does not use drugs.   Allergies  Allergen Reactions   Ciprofloxacin Shortness Of Breath and Other (See Comments)    Numbness in extremities    Gabapentin Other (See Comments)    Mental problems, depression, anger   Chlorhexidine Gluconate Other (See Comments)    Burning and itching  Family History  Problem Relation Age of Onset   Coronary artery disease Mother    Hypertension Mother    Hyperlipidemia Mother    Ovarian cancer Mother    Ovarian cancer Sister    Liver cancer Sister    Liver cancer Brother     Family history reviewed and not pertinent    Prior to Admission medications   Medication Sig Start Date End Date Taking? Authorizing Provider  acetaminophen (TYLENOL) 500 MG tablet Take 1,000 mg by mouth every 6 (six) hours as needed (pain.).    [provider]  amiodarone (PACERONE) 200 MG tablet Take 1 tablet (200 mg total) by mouth daily. 10/21/22   Alver Sorrow, NP  amLODipine (NORVASC) 5 MG tablet  Take 1 tablet (5 mg total) by mouth daily. 10/21/22   Alver Sorrow, NP  apixaban (ELIQUIS) 5 MG TABS tablet Take 1 tablet (5 mg total) by mouth 2 (two) times daily. 03/12/23   [name], MD  busPIRone (BUSPAR) 5 MG tablet Take 5 mg by mouth 2 (two) times daily. 07/23/22   [provider]  citalopram (CELEXA) 40 MG tablet Take 40 mg by mouth daily. 07/19/21   [provider]  folic acid (FOLVITE) 1 MG tablet Take 1 tablet (1 mg total) by mouth daily. 08/09/20   Glade Lloyd, MD  furosemide (LASIX) 20 MG tablet Take 1 tablet (20 mg total) by mouth as needed. 02/22/21   [name], MD  ketoconazole (NIZORAL) 2 % cream Apply 1 application topically 2 (two) times daily. GROIN AREA 10/11/20   [provider]  ketorolac (ACULAR) 0.5 % ophthalmic solution  10/24/20   [provider]  losartan (COZAAR) 25 MG tablet Take 1 tablet (25 mg total) by mouth daily. Pt needs to make appt with provider to continue getting refills 10/21/22   Alver Sorrow, NP  lovastatin (MEVACOR) 20 MG tablet Take 1 tablet (20 mg total) by mouth daily. 10/21/22   Alver Sorrow, NP  metoprolol succinate (TOPROL-XL) 25 MG 24 hr tablet Take 1 tablet (25 mg total) by mouth daily. 10/21/22   Alver Sorrow, NP  montelukast (SINGULAIR) 10 MG tablet Take 10 mg by mouth daily.    [provider]  Multiple Vitamin (MULTIVITAMIN WITH MINERALS) TABS tablet Take 1 tablet by mouth daily. 08/09/20   Glade Lloyd, MD  nystatin cream (MYCOSTATIN) Apply to affected area 2 times daily 08/19/22   Eber Hong, MD  polyethylene glycol (MIRALAX / GLYCOLAX) 17 g packet Take 17 g by mouth daily as needed for mild constipation. 08/21/20   Rolly Salter, MD  rivastigmine (EXELON) 3 MG capsule TAKE 1 CAPSULE (3 MG TOTAL) BY MOUTH 2 (TWO) TIMES DAILY. 08/27/21 05/24/22  Windell Norfolk, MD  terazosin (HYTRIN) 5 MG capsule Take 5 mg by mouth in the morning.    [provider]  thiamine  (VITAMIN B-1) 100 MG tablet Take 100 mg by mouth daily. 01/21/21   [provider]  umeclidinium bromide (INCRUSE ELLIPTA) 62.5 MCG/ACT AEPB Inhale 1 puff into the lungs daily. Patient not taking: Reported on 10/21/2022 10/17/21   Omar Person, MD     Objective    Physical Exam: Vitals:   04/09/23 1700 04/09/23 1715 04/09/23 1852 04/09/23 1951  BP: (!) 164/75   (!) 144/70  Pulse: (!) 48 (!) 50  (!) 56  Resp: 10 15  16   Temp:   97.9 F (36.6 C) (!) 97.5 F (  36.4 C)  TempSrc:   Oral Oral  SpO2: 93% 96%  94%  Weight:    100.3 kg  Height:    5\' 9"  (1.753 m)    General: appears to be stated age; alert, oriented; mildly increased work of breathing noted. Skin: warm, dry, no rash Head:  AT/[place] Mouth:  Oral mucosa membranes appear moist, normal dentition Neck: supple; trachea midline Heart: Mildly bradycardic, but regular; did not appreciate any M/R/G Lungs: CTAB, did not appreciate any wheezes, rales, or rhonchi Abdomen: + BS; soft, ND, NT Vascular: 2+ pedal pulses b/l; 2+ radial pulses b/l Extremities: Trace edema in the bilateral lower extremities, no muscle wasting Neuro: strength and sensation intact in upper and lower extremities b/l     Labs on Admission: I have personally reviewed following labs and imaging studies  CBC: Recent Labs  Lab 04/09/23 1413  WBC 4.2  HGB 12.7*  HCT 39.8  MCV 89.4  PLT 165   Basic Metabolic Panel: Recent Labs  Lab 04/09/23 1413  NA 132*  K 3.9  CL 95*  CO2 28  GLUCOSE 135*  BUN 15  CREATININE 0.60*  CALCIUM 8.7*   GFR: Estimated Creatinine Clearance: 96 mL/min (A) (by C-G formula based on SCr of 0.6 mg/dL (L)). Liver Function Tests: No results for input(s): "AST", "ALT", "ALKPHOS", "BILITOT", "PROT", "ALBUMIN" in the last 168 hours. No results for input(s): "LIPASE", "AMYLASE" in the last 168 hours. No results for input(s): "AMMONIA" in the last 168 hours. Coagulation Profile: No results for input(s):  "INR", "PROTIME" in the last 168 hours. Cardiac Enzymes: No results for input(s): "CKTOTAL", "CKMB", "CKMBINDEX", "TROPONINI" in the last 168 hours. BNP (last 3 results) No results for input(s): "PROBNP" in the last 8760 hours. HbA1C: No results for input(s): "HGBA1C" in the last 72 hours. CBG: Recent Labs  Lab 04/09/23 1414  GLUCAP 148*   Lipid Profile: No results for input(s): "CHOL", "HDL", "LDLCALC", "TRIG", "CHOLHDL", "LDLDIRECT" in the last 72 hours. Thyroid Function Tests: No results for input(s): "TSH", "T4TOTAL", "FREET4", "T3FREE", "THYROIDAB" in the last 72 hours. Anemia Panel: No results for input(s): "VITAMINB12", "FOLATE", "FERRITIN", "TIBC", "IRON", "RETICCTPCT" in the last 72 hours. Urine analysis:    Component Value Date/Time   COLORURINE YELLOW 08/19/2022 1255   APPEARANCEUR CLEAR 08/19/2022 1255   LABSPEC 1.014 08/19/2022 1255   PHURINE 6.0 08/19/2022 1255   GLUCOSEU NEGATIVE 08/19/2022 1255   HGBUR LARGE (A) 08/19/2022 1255   BILIRUBINUR NEGATIVE 08/19/2022 1255   KETONESUR NEGATIVE 08/19/2022 1255   PROTEINUR NEGATIVE 08/19/2022 1255   NITRITE NEGATIVE 08/19/2022 1255   LEUKOCYTESUR NEGATIVE 08/19/2022 1255    Radiological Exams on Admission: CT Head Wo Contrast Result Date: 04/09/2023 CLINICAL DATA:  Headache, increasing frequency or severity. EXAM: CT HEAD WITHOUT CONTRAST TECHNIQUE: Contiguous axial images were obtained from the base of the skull through the vertex without intravenous contrast. RADIATION DOSE REDUCTION: This exam was performed according to the departmental dose-optimization program which includes automated exposure control, adjustment of the mA and/or kV according to patient size and/or use of iterative reconstruction technique. COMPARISON:  CT scan head from 02/01/2021. FINDINGS: Brain: No evidence of acute infarction, hemorrhage, hydrocephalus, extra-axial collection or mass lesion/mass effect. There is bilateral periventricular  hypodensity, which is non-specific but most likely seen in the settings of microvascular ischemic changes. Mild in extent. otherwise normal appearance of brain parenchyma. Ventricles are normal. Cerebral volume is age appropriate. Vascular: No hyperdense vessel or unexpected calcification. Intracranial arteriosclerosis. Skull: Normal. Negative for  fracture or focal lesion. Redemonstration of postsurgical changes from prior left occipital parietal craniotomy. Sinuses/Orbits: No acute finding. Other: Visualized mastoid air cells are unremarkable. No mastoid effusion. IMPRESSION: *No acute intracranial abnormality. Electronically Signed   By: Jules Schick M.D.   On: 04/09/2023 16:08   DG Chest 2 View Result Date: 04/09/2023 CLINICAL DATA:  Shortness of breath. EXAM: CHEST - 2 VIEW COMPARISON:  07/20/2021. FINDINGS: There are atelectatic changes at the lung bases, right more than left. There is subtle blunting of right costophrenic angle, suggesting trace right pleural effusion. Bilateral lung fields are otherwise clear. Left costophrenic angle is clear. Stable cardio-mediastinal silhouette. No acute osseous abnormalities. Multiple old healed right posterolateral rib fractures noted. The soft tissues are within normal limits. IMPRESSION: Atelectatic changes at the lung bases, right more than left. Trace right pleural effusion. Electronically Signed   By: Jules Schick M.D.   On: 04/09/2023 16:05      Assessment/Plan   Principal Problem:   Acute on chronic diastolic CHF (congestive heart failure) (HCC) Active Problems:   HLD (hyperlipidemia)   BPH (benign prostatic hyperplasia)   Essential hypertension   Chronic hyponatremia   Paroxysmal atrial fibrillation (HCC)   SOB (shortness of breath)   COPD with acute exacerbation (HCC)   GAD (generalized anxiety disorder)     #) Acute on chronic diastolic heart failure: dx of acute decompensation on the basis of presenting 1 week of progressive  shortness of breath associated with associated with orthopnea, with a nearly 6 fold interval increase in BNP, with presenting value of 775 representing the patient's highest documented BNP value on file, with evidence of pleural effusion on presenting chest x-ray. This is in the context of a known history of chronic diastolic heart failure, with most recent TTE performed July 2022 notable for LVEF 55 to 60%, indeterminate diastolic parameters, mildly reduced right ventricular systolic function. Etiology leading to presenting acutely decompensated heart failure is currently unclear.  It is noted that the patient is not currently on a scheduled diuretic medications at home, but rather is on Lasix on a as needed basis.  Overall, ACS leading to presenting acutely decompensated heart failure appears less likely at this time in the absence of any recent CP, presenting EKG showing no evidence of acute ischemic changes.  Of note, patient received Lasix 20 mg IV x 1 dose while in the ED today. Presentation warrants additional IV diuresis, as further detailed below, with close monitoring of ensuing renal function, electrolytes, and volume status, as further noted below.  Given that he is relatively nave to Lasix, and given his history of chronic right-sided systolic heart failure, we will proceed with caution so as to not overdiuresis.   As patient is already on a BB at home, will plan to continue this, having in the setting of presenting acutely decompensated heart failure as well as evidence of sinus bradycardia, we will reduce, for now, dose of his metoprolol succinate from 25 mg p.o. daily to 12.5 mg p.o. daily.    Plan: monitor strict I's & O's and daily weights. Monitor on telemetry. CMP in the morning, including for monitoring trend of potassium, bicarbonate, and renal function in response to interval diuresis efforts. Check serum magnesium level. Close monitoring of ensuing blood pressure response to  diuresis efforts, including to help guide need for improvement in afterload reduction in order to optimize cardiac output. Lasix 20 mg IV twice daily. Continue outpatient BB, but reduce dose by half, as further  quantified above.  Continue outpatient Norvasc and losartan.  Echocardiogram in the morning.  Check urinary drug screen.  Check TSH.                   #) Acute COPD exacerbation: in the context of a documented history of COPD, there is also concern for a potential additional element of acute COPD exacerbation, potentially as a physiologic consequence of suspected presenting acute on chronic diastolic heart failure, as above, in the setting of presenting 1 week of progressive shortness of breath, acute cough, wheezing, as further detailed above.  It also appears that his scheduled Incruse Ellipta was discontinued approximately 6 months ago, and that he has not subsequently been on any long-acting beta-2 agonist or any long-acting anticholinergic agent for his copd, potentially representing an additional contributing factor leading to a suspected element of acute COPD exacerbation.  presenting CXR shows bibasilar atelectasis, as well as a small right-sided pleural effusion, but no evidence of infiltrate to suggest pneumonia and or any evidence of pneumothorax.    Additionally, ACS appears less likely, as further detailed above. Clinically, acute PE also appears to be less likely at this time, particularly given that he is chronically anticoagulated on Eliquis. COVID-19/influenza/RSV PCR are negative. Will add-on procalcitonin to further eval for underlying pna.  He conveys that he is a former smoker.  Plan: Monitor on telemetry. Solumedrol. Scheduled duonebs q6 hours. Prn albuterol inhaler. CMP in the morning. Repeat CBC in the morning. Check serum Mg and Phos levels. Will attempt additional chart review to evaluate most recent PFT results. Will start Azithromycin  for benefit of  shortened duration of hospitalization associated with antibiotic initiation in the setting of acute COPD exacerbation. Check blood gas. Add-on procalcitonin.  Incentive spirometry.                    #) Paroxysmal atrial fibrillation: Documented history of such. In setting of CHA2DS2-VASc score of 3, there is an indication for chronic anticoagulation for thromboembolic prophylaxis. Consistent with this, patient is chronically anticoagulated on Eliquis. Home AV nodal blocking regimen: Metoprolol succinate 25 mg p.o. daily, which will be decreased to 12.5 mg p.o. daily for now in the setting of presenting acute on chronic diastolic heart failure as well as evidence of sinus bradycardia.  Additionally, he is on amiodarone as an outpatient.  Most recent echocardiogram was performed in July 2022, with results as further detailed above.  No evidence of significant valvular pathology on this prior echocardiogram.. Presenting EKG appears to show sinus bradycardia without overt evidence of acute ischemic changes, as above..   Plan: monitor strict I's & O's and daily weights. CMP/CBC in AM. Check serum mag level. Continue home metoprolol succinate, but reduce dose to 12.5 mg p.o. daily, as above.  Continue outpatient amiodarone.  Continue outpatient Eliquis.  Monitor on telemetry.  Follow for result of updated echocardiogram, ordered for the morning.                      #) Benign Prostatic Hyperplasia:  documented h/o such; on terazosin as outpatient.   Plan: monitor strict I's & O's and daily weights. Repeat CMP in AM.  Continue outpatient terazosin.  Will                    #) Hyperlipidemia: documented h/o such. On lovastatin as outpatient.   Plan: continue home statin.                      #)  Essential Hypertension: documented h/o such, with outpatient antihypertensive regimen including Norvasc, losartan, metoprolol succinate 25 mg  p.o. daily.  He is also on Lasix on a as needed basis at home.  SBP's in the ED today: 1 40-1 60 mmHg. will closely monitor resident blood pressure trend given plan for additional IV diuresis, as further detailed below.  Plan: Close monitoring of subsequent BP via routine VS. resume outpatient losartan, Norvasc.  Continue outpatient metoprolol succinate, but reduce dose to 12.5 mg p.o. daily, as above.  Monitor strict I's and O's and daily weights.                       #) Generalized anxiety disorder: documented h/o such. On Celexa as well as BuSpar as outpatient.  The chronicity of his hyponatremia is noted, with presenting serum sodium level today consistent with baseline range, as further quantified below.  Plan: Continue outpatient Celexa as well as BuSpar.                       #) Chronic hypoosmolar hyponatremia: Documented history of such, with baseline serum sodium level dating back to July 2022 noted to be in the range of 1 30-1 34, with presenting serum sodium level of 132 consistent with his baseline range.  Will closely monitor ensuing serum sodium trend given the patient's presentation that includes suspicion for acute on chronic diastolic heart failure exacerbation for which she will undergo additional IV diuresis efforts, as further detailed below.  Plan: Monitor strict I's and O's and daily weights.  CMP in the morning.     DVT prophylaxis: SCD's plus continuation of home Eliquis Code Status: Full code Family Communication: none Disposition Plan: Per Rounding Team Consults called: none;  Admission status: Inpatient     I SPENT GREATER THAN 75  MINUTES IN CLINICAL CARE TIME/MEDICAL DECISION-MAKING IN COMPLETING THIS ADMISSION.      Chaney Born Georgia Delsignore DO [facility]  From 7PM - 7AM   04/09/2023, 8:24 PM

## 2023-04-09 NOTE — ED Triage Notes (Signed)
 Pt coming in with complaint of SOB starting a week ago, has remained steady. Home SpO2 88% at home per patient. No home O2. Pt denies cough, fever, body aches, weakness. Pt endorses fatigue, headache. Hx of heart failure.

## 2023-04-09 NOTE — ED Notes (Signed)
 Patient walked with pulse ox. SAT dropped to 85% in a very short distance. Walked back to room and placed on 2L. MD aware

## 2023-04-10 ENCOUNTER — Inpatient Hospital Stay (HOSPITAL_COMMUNITY)

## 2023-04-10 DIAGNOSIS — I5033 Acute on chronic diastolic (congestive) heart failure: Secondary | ICD-10-CM

## 2023-04-10 DIAGNOSIS — E871 Hypo-osmolality and hyponatremia: Secondary | ICD-10-CM | POA: Diagnosis not present

## 2023-04-10 DIAGNOSIS — N400 Enlarged prostate without lower urinary tract symptoms: Secondary | ICD-10-CM | POA: Diagnosis not present

## 2023-04-10 DIAGNOSIS — I10 Essential (primary) hypertension: Secondary | ICD-10-CM | POA: Diagnosis not present

## 2023-04-10 LAB — CBC WITH DIFFERENTIAL/PLATELET
Abs Immature Granulocytes: 0.01 10*3/uL (ref 0.00–0.07)
Basophils Absolute: 0 10*3/uL (ref 0.0–0.1)
Basophils Relative: 0 %
Eosinophils Absolute: 0 10*3/uL (ref 0.0–0.5)
Eosinophils Relative: 0 %
HCT: 37.6 % — ABNORMAL LOW (ref 39.0–52.0)
Hemoglobin: 12.4 g/dL — ABNORMAL LOW (ref 13.0–17.0)
Immature Granulocytes: 0 %
Lymphocytes Relative: 10 %
Lymphs Abs: 0.4 10*3/uL — ABNORMAL LOW (ref 0.7–4.0)
MCH: 28.8 pg (ref 26.0–34.0)
MCHC: 33 g/dL (ref 30.0–36.0)
MCV: 87.2 fL (ref 80.0–100.0)
Monocytes Absolute: 0.1 10*3/uL (ref 0.1–1.0)
Monocytes Relative: 3 %
Neutro Abs: 3.7 10*3/uL (ref 1.7–7.7)
Neutrophils Relative %: 87 %
Platelets: 158 10*3/uL (ref 150–400)
RBC: 4.31 MIL/uL (ref 4.22–5.81)
RDW: 14.1 % (ref 11.5–15.5)
WBC: 4.3 10*3/uL (ref 4.0–10.5)
nRBC: 0 % (ref 0.0–0.2)

## 2023-04-10 LAB — RAPID URINE DRUG SCREEN, HOSP PERFORMED
Amphetamines: NOT DETECTED
Barbiturates: NOT DETECTED
Benzodiazepines: NOT DETECTED
Cocaine: NOT DETECTED
Opiates: NOT DETECTED
Tetrahydrocannabinol: NOT DETECTED

## 2023-04-10 LAB — COMPREHENSIVE METABOLIC PANEL WITH GFR
ALT: 53 U/L — ABNORMAL HIGH (ref 0–44)
AST: 45 U/L — ABNORMAL HIGH (ref 15–41)
Albumin: 3.7 g/dL (ref 3.5–5.0)
Alkaline Phosphatase: 74 U/L (ref 38–126)
Anion gap: 10 (ref 5–15)
BUN: 16 mg/dL (ref 8–23)
CO2: 26 mmol/L (ref 22–32)
Calcium: 8.7 mg/dL — ABNORMAL LOW (ref 8.9–10.3)
Chloride: 94 mmol/L — ABNORMAL LOW (ref 98–111)
Creatinine, Ser: 0.62 mg/dL (ref 0.61–1.24)
GFR, Estimated: >60 mL/min (ref >60)
Glucose, Bld: 132 mg/dL — ABNORMAL HIGH (ref 70–99)
Potassium: 4.2 mmol/L (ref 3.5–5.1)
Sodium: 130 mmol/L — ABNORMAL LOW (ref 135–145)
Total Bilirubin: 1 mg/dL (ref 0.0–1.2)
Total Protein: 6.5 g/dL (ref 6.5–8.1)

## 2023-04-10 LAB — ECHOCARDIOGRAM COMPLETE
AR max vel: 2.49 cm2
AV Area VTI: 2.63 cm2
AV Area mean vel: 2.4 cm2
AV Mean grad: 3 mmHg
AV Peak grad: 5 mmHg
Ao pk vel: 1.12 m/s
Area-P 1/2: 5.84 cm2
Height: 69 in
S' Lateral: 2.3 cm
Weight: 3518.4 [oz_av]

## 2023-04-10 LAB — URINALYSIS, ROUTINE W REFLEX MICROSCOPIC
Bacteria, UA: NONE SEEN
Bilirubin Urine: NEGATIVE
Glucose, UA: NEGATIVE mg/dL
Hgb urine dipstick: NEGATIVE
Ketones, ur: NEGATIVE mg/dL
Leukocytes,Ua: NEGATIVE
Nitrite: NEGATIVE
Protein, ur: 30 mg/dL — AB
Specific Gravity, Urine: 1.016 (ref 1.005–1.030)
pH: 5 (ref 5.0–8.0)

## 2023-04-10 LAB — BLOOD GAS, VENOUS
Acid-Base Excess: 4.7 mmol/L — ABNORMAL HIGH (ref 0.0–2.0)
Bicarbonate: 29.2 mmol/L — ABNORMAL HIGH (ref 20.0–28.0)
O2 Saturation: 69.8 %
Patient temperature: 37
pCO2, Ven: 42 mmHg — ABNORMAL LOW (ref 44–60)
pH, Ven: 7.45 — ABNORMAL HIGH (ref 7.25–7.43)
pO2, Ven: 39 mmHg (ref 32–45)

## 2023-04-10 LAB — MAGNESIUM: Magnesium: 2.1 mg/dL (ref 1.7–2.4)

## 2023-04-10 LAB — PHOSPHORUS: Phosphorus: 2.9 mg/dL (ref 2.5–4.6)

## 2023-04-10 LAB — PROCALCITONIN: Procalcitonin: <0.1 ng/mL

## 2023-04-10 MED ORDER — KETOROLAC TROMETHAMINE 15 MG/ML IJ SOLN
15.0000 mg | Freq: Once | INTRAMUSCULAR | Status: AC
  Administered 2023-04-10: 15 mg via INTRAVENOUS
  Filled 2023-04-10: qty 1

## 2023-04-10 MED ORDER — CITALOPRAM HYDROBROMIDE 20 MG PO TABS
20.0000 mg | ORAL_TABLET | Freq: Every day | ORAL | Status: DC
  Administered 2023-04-11 – 2023-04-17 (×7): 20 mg via ORAL
  Filled 2023-04-10 (×7): qty 1

## 2023-04-10 MED ORDER — FUROSEMIDE 10 MG/ML IJ SOLN
40.0000 mg | Freq: Two times a day (BID) | INTRAMUSCULAR | Status: DC
  Administered 2023-04-10 – 2023-04-13 (×6): 40 mg via INTRAVENOUS
  Filled 2023-04-10 (×6): qty 4

## 2023-04-10 NOTE — Progress Notes (Signed)
 PROGRESS NOTE    Daymen Hassebrock [facility]  WGN:[id number] DOB: [birth year] DOA: 04/09/2023 PCP: [name], PA   Brief Narrative:  Is a 73 year old Caucasian male with a past medical history significant for but not limited to paroxysmal atrial fibrillation on anticoagulation, chronic diastolic CHF, history of intracranial hemorrhage and September 2022, BPH, chronic hyponatremia, essential hypertension, hyperlipidemia, who presented from med [facility] ED for acute shortness of breath that has been worsening over the last several weeks.  Being admitted and treated for acute respiratory failure with hypoxia in the setting of an acute exacerbation of COPD and acute exacerbation on chronic diastolic CHF.  Assessment and Plan:  Acute Exacerbation of Diastolic CHF: BNP elevated at 784.6 on Admission. ECHO this visit shows Grade 3 DD. BB as Below. Increase Diuresis from IV Lasix 20 mg BID to IV 40 mg BID. Strict Is and Os and Daily Weights. Repeat CXR in the AM  Acute Exacerbation of COPD: C/w Steroids with Solumedrol. Initiated on IV Azithromycin. Flutter Valve and Incentive Spirometry. DuoNeb q6h. CXR done and showed Bibasilar ATX as well as a Small R sided Pleural Effusion  Acute Respiratory Failure with Hypoxia: In the setting of Above. C/w Diuresis and Treatment. Wean O2 as tolerated. Will need an Ambulatory Home O2 Screen prior to D/C  PAF: Has a CHA2DS2-VASc of 3. C/w Metoprolol Succinate 12.5 mg po Daily and Amiodarone 200 mg po Daily. C/w Anticoagulation with Apixaban 5 mg po BID. ECHOCardiogram as above  Essential HTN: C/w Amlodipine 5 mg po Daily and Metoprolol Succinate 12.5 mg po Daily. C/w Terazosin 5 mg po daily. CTM BP per Protocol. Last BP Reading was 143/61  HLD: C/w Lovastatin substitution for Pravastatin 20 mg po Daily  HypoNa+: Likely 2/2 to Hypervolemia but has chronic hypoosmolar hyponatremia. B/L ranging from 130-134. C/w Diuresis. Na+ went from 132 -> 130. CTM and Trend  and repeat CMP in the AM   BPH: C/w Terazosin 5 mg po Daily   Normocytic Anemia: Hgb/Hct went from 12.7/39.8 -> 12.4/37.6. Check Anemia Panel in the AM. CTM for S/Sx of Bleeding; No overt bleeding noted. Repeat CBC in the AM   Generalized Anxiety Disorder: C/w Buspirone 5 mg po BID and Citalopram 20 mg po Daily   Abnormal LFTs: Mild and likely in the setting of Volume Overload. AST is now 45 and ALT is now 53. CTM and Trend and if not improving will obtain a right upper quadrant ultrasound as well as an acute hepatitis panel.  Pete CMP in AM.  Class I Obesity: Complicates overall prognosis and care. Estimated body mass index is 32.47 kg/m as calculated from the following:   Height as of this encounter: 5\' 9"  (1.753 m).   Weight as of this encounter: 99.7 kg. Weight Loss and Dietary Counseling given   DVT prophylaxis:  apixaban (ELIQUIS) tablet 5 mg    Code Status: Full Code Family Communication: Discussed with wife at bedside  Disposition Plan:  Level of care: Telemetry Cardiac Status is: Inpatient Remains inpatient appropriate because: His further clinical improvement and further diuresis.   Consultants:  None  Procedures:  As delineated as above; ECHOCardiogram  Antimicrobials:  Anti-infectives (From admission, onward)    Start     Dose/Rate Route Frequency Ordered Stop   04/09/23 2200  azithromycin (ZITHROMAX) 500 mg in sodium chloride 0.9 % 250 mL IVPB       Note to Pharmacy: (In setting of acute copd exac)   500 mg 250 mL/hr  over 60 Minutes Intravenous Every 24 hours 04/09/23 2053         Subjective: Seen and examined at bedside and was still feeling short of breath and is doing better with the oxygen.  No nausea or vomiting.  Also complaining of a slight headache.  Denies any other concerns or complaints this time.  Objective: Vitals:   04/10/23 1157 04/10/23 1345 04/10/23 1400 04/10/23 1627  BP:   (!) 144/61 (!) 143/61  Pulse:  (!) 53 (!) 58 (!) 56  Resp: 18  18 18 18   Temp: 97.7 F (36.5 C)  98.1 F (36.7 C) 98.8 F (37.1 C)  TempSrc: Oral  Oral Oral  SpO2:  95%  94%  Weight:      Height:        Intake/Output Summary (Last 24 hours) at 04/10/2023 1823 Last data filed at 04/10/2023 1800 Gross per 24 hour  Intake 1690 ml  Output 1750 ml  Net -60 ml   Filed Weights   04/09/23 1403 04/09/23 1951 04/10/23 0615  Weight: 99.8 kg 100.3 kg 99.7 kg   Examination: Physical Exam:  Constitutional: WN/WD obese Caucasian male in no acute distress Respiratory: Diminished to auscultation bilaterally with some coarse breath sounds, mild crackles and some slight wheezing but no real appreciable rhonchi.  Wearing supplemental oxygen nasal cannula and is slightly tachypneic Cardiovascular: RRR, no murmurs / rubs / gallops. S1 and S2 auscultated.  Mild extremity edema Abdomen: Soft, non-tender, distended secondary body habitus. Bowel sounds positive.  GU: Deferred. Musculoskeletal: No clubbing / cyanosis of digits/nails. No joint deformity upper and lower extremities.  Skin: No rashes, lesions, ulcers on limited skin evaluation. No induration; Warm and dry.  Neurologic: CN 2-12 grossly intact with no focal deficits. Romberg sign and cerebellar reflexes not assessed.  Psychiatric: Normal judgment and insight. Alert and oriented x 3. Normal mood and appropriate affect.   Data Reviewed: I have personally reviewed following labs and imaging studies  CBC: Recent Labs  Lab 04/09/23 1413 04/10/23 0531  WBC 4.2 4.3  NEUTROABS  --  3.7  HGB 12.7* 12.4*  HCT 39.8 37.6*  MCV 89.4 87.2  PLT 165 158   Basic Metabolic Panel: Recent Labs  Lab 04/09/23 1413 04/10/23 0531  NA 132* 130*  K 3.9 4.2  CL 95* 94*  CO2 28 26  GLUCOSE 135* 132*  BUN 15 16  CREATININE 0.60* 0.62  CALCIUM 8.7* 8.7*  MG  --  2.1  PHOS  --  2.9   GFR: Estimated Creatinine Clearance: 95.7 mL/min (by C-G formula based on SCr of 0.62 mg/dL). Liver Function Tests: Recent Labs   Lab 04/10/23 0531  AST 45*  ALT 53*  ALKPHOS 74  BILITOT 1.0  PROT 6.5  ALBUMIN 3.7   No results for input(s): "LIPASE", "AMYLASE" in the last 168 hours. No results for input(s): "AMMONIA" in the last 168 hours. Coagulation Profile: No results for input(s): "INR", "PROTIME" in the last 168 hours. Cardiac Enzymes: No results for input(s): "CKTOTAL", "CKMB", "CKMBINDEX", "TROPONINI" in the last 168 hours. BNP (last 3 results) No results for input(s): "PROBNP" in the last 8760 hours. HbA1C: No results for input(s): "HGBA1C" in the last 72 hours. CBG: Recent Labs  Lab 04/09/23 1414  GLUCAP 148*   Lipid Profile: No results for input(s): "CHOL", "HDL", "LDLCALC", "TRIG", "CHOLHDL", "LDLDIRECT" in the last 72 hours. Thyroid Function Tests: Recent Labs    04/09/23 2109  TSH 0.953   Anemia Panel: No results for  input(s): "VITAMINB12", "FOLATE", "FERRITIN", "TIBC", "IRON", "RETICCTPCT" in the last 72 hours. Sepsis Labs: Recent Labs  Lab 04/10/23 0531  PROCALCITON <0.10   Recent Results (from the past 240 hours)  Resp panel by RT-PCR (RSV, Flu A&B, Covid) Anterior Nasal Swab     Status: None   Collection Time: 04/09/23  2:13 PM   Specimen: Anterior Nasal Swab  Result Value Ref Range Status   SARS Coronavirus 2 by RT PCR NEGATIVE NEGATIVE Final    Comment: (NOTE) SARS-CoV-2 target nucleic acids are NOT DETECTED.  The SARS-CoV-2 RNA is generally detectable in upper respiratory specimens during the acute phase of infection. The lowest concentration of SARS-CoV-2 viral copies this assay can detect is 138 copies/mL. A negative result does not preclude SARS-Cov-2 infection and should not be used as the sole basis for treatment or other patient management decisions. A negative result may occur with  improper specimen collection/handling, submission of specimen other than nasopharyngeal swab, presence of viral mutation(s) within the areas targeted by this assay, and  inadequate number of viral copies(<138 copies/mL). A negative result must be combined with clinical observations, patient history, and epidemiological information. The expected result is Negative.  Fact Sheet for Patients:  BloggerCourse.com  Fact Sheet for Healthcare Providers:  SeriousBroker.it  This test is no t yet approved or cleared by the [place] FDA and  has been authorized for detection and/or diagnosis of SARS-CoV-2 by FDA under an Emergency Use Authorization (EUA). This EUA will remain  in effect (meaning this test can be used) for the duration of the COVID-19 declaration under Section 564(b)(1) of the Act, 21 U.S.C.section 360bbb-3(b)(1), unless the authorization is terminated  or revoked sooner.       Influenza A by PCR NEGATIVE NEGATIVE Final   Influenza B by PCR NEGATIVE NEGATIVE Final    Comment: (NOTE) The Xpert Xpress SARS-CoV-2/FLU/RSV plus assay is intended as an aid in the diagnosis of influenza from Nasopharyngeal swab specimens and should not be used as a sole basis for treatment. Nasal washings and aspirates are unacceptable for Xpert Xpress SARS-CoV-2/FLU/RSV testing.  Fact Sheet for Patients: BloggerCourse.com  Fact Sheet for Healthcare Providers: SeriousBroker.it  This test is not yet approved or cleared by the [place] FDA and has been authorized for detection and/or diagnosis of SARS-CoV-2 by FDA under an Emergency Use Authorization (EUA). This EUA will remain in effect (meaning this test can be used) for the duration of the COVID-19 declaration under Section 564(b)(1) of the Act, 21 U.S.C. section 360bbb-3(b)(1), unless the authorization is terminated or revoked.     Resp Syncytial Virus by PCR NEGATIVE NEGATIVE Final    Comment: (NOTE) Fact Sheet for Patients: BloggerCourse.com  Fact Sheet for Healthcare  Providers: SeriousBroker.it  This test is not yet approved or cleared by the [place] FDA and has been authorized for detection and/or diagnosis of SARS-CoV-2 by FDA under an Emergency Use Authorization (EUA). This EUA will remain in effect (meaning this test can be used) for the duration of the COVID-19 declaration under Section 564(b)(1) of the Act, 21 U.S.C. section 360bbb-3(b)(1), unless the authorization is terminated or revoked.  Performed at [facility], [address] [place], [place] [address]     Radiology Studies: ECHOCARDIOGRAM COMPLETE Result Date: 04/10/2023    ECHOCARDIOGRAM REPORT   Patient Name:   Samuel Barnes Date of Exam: 04/10/2023 Medical Rec #:  [id number]             Height:  69.0 in Accession #:    [id number]            Weight:       219.9 lb Date of Birth:  [birth year]             BSA:          2.151 m Patient Age:    72 years              BP:           157/75 mmHg Patient Gender: M                     HR:           54 bpm. Exam Location:  Inpatient Procedure: 2D Echo, Cardiac Doppler and Color Doppler (Both Spectral and Color            Flow Doppler were utilized during procedure). Indications:    CHF  History:        Patient has prior history of Echocardiogram examinations, most                 recent 07/29/2020. CHF, COPD, Arrythmias:Atrial Fibrillation;                 Risk Factors:Hypertension.  Sonographer:    Darlys Gales Referring Phys: [id number] JUSTIN B HOWERTER IMPRESSIONS  1. Left ventricular ejection fraction, by estimation, is 55 to 60%. The left ventricle has normal function. The left ventricle has no regional wall motion abnormalities. Left ventricular diastolic parameters are consistent with Grade III diastolic dysfunction (restrictive). Elevated left ventricular end-diastolic pressure.  2. Right ventricular systolic function is normal. The right ventricular size is mildly enlarged. There is moderately  elevated pulmonary artery systolic pressure. The estimated right ventricular systolic pressure is 51.7 mmHg.  3. Left atrial size was severely dilated.  4. Right atrial size was severely dilated.  5. The mitral valve is abnormal. Mild to moderate mitral valve regurgitation.  6. Tricuspid valve regurgitation is moderate to severe.  7. The aortic valve is tricuspid. Aortic valve regurgitation is trivial.  8. The inferior vena cava is dilated in size with <50% respiratory variability, suggesting right atrial pressure of 15 mmHg. Comparison(s): Changes from prior study are noted. 07/29/2020: LVEF 55-60%. FINDINGS  Left Ventricle: Left ventricular ejection fraction, by estimation, is 55 to 60%. The left ventricle has normal function. The left ventricle has no regional wall motion abnormalities. The left ventricular internal cavity size was normal in size. There is  no left ventricular hypertrophy. Left ventricular diastolic parameters are consistent with Grade III diastolic dysfunction (restrictive). Elevated left ventricular end-diastolic pressure. Right Ventricle: The right ventricular size is mildly enlarged. No increase in right ventricular wall thickness. Right ventricular systolic function is normal. There is moderately elevated pulmonary artery systolic pressure. The tricuspid regurgitant velocity is 3.03 m/s, and with an assumed right atrial pressure of 15 mmHg, the estimated right ventricular systolic pressure is 51.7 mmHg. Left Atrium: Left atrial size was severely dilated. Right Atrium: Right atrial size was severely dilated. Pericardium: There is no evidence of pericardial effusion. Mitral Valve: The mitral valve is abnormal. There is mild calcification of the anterior and posterior mitral valve leaflet(s). Mild to moderate mitral valve regurgitation. Tricuspid Valve: The tricuspid valve is grossly normal. Tricuspid valve regurgitation is moderate to severe. Aortic Valve: The aortic valve is tricuspid. Aortic  valve regurgitation is trivial. Aortic valve mean gradient measures 3.0 mmHg. Aortic valve peak gradient  measures 5.0 mmHg. Aortic valve area, by VTI measures 2.63 cm. Pulmonic Valve: The pulmonic valve was normal in structure. Pulmonic valve regurgitation is not visualized. Aorta: The aortic root and ascending aorta are structurally normal, with no evidence of dilitation. Venous: The inferior vena cava is dilated in size with less than 50% respiratory variability, suggesting right atrial pressure of 15 mmHg. IAS/Shunts: No atrial level shunt detected by color flow Doppler.  LEFT VENTRICLE PLAX 2D LVIDd:         4.00 cm   Diastology LVIDs:         2.30 cm   LV e' medial:    8.27 cm/s LV PW:         0.90 cm   LV E/e' medial:  16.0 LV IVS:        0.80 cm   LV e' lateral:   8.38 cm/s LVOT diam:     1.90 cm   LV E/e' lateral: 15.8 LV SV:         76 LV SV Index:   35 LVOT Area:     2.84 cm  RIGHT VENTRICLE RV Basal diam:  4.60 cm RV Mid diam:    3.80 cm LEFT ATRIUM            Index        RIGHT ATRIUM           Index LA Vol (A4C): 120.0 ml 55.79 ml/m  RA Area:     36.30 cm                                     RA Volume:   137.50 ml 63.92 ml/m  AORTIC VALVE AV Area (Vmax):    2.49 cm AV Area (Vmean):   2.40 cm AV Area (VTI):     2.63 cm AV Vmax:           112.00 cm/s AV Vmean:          85.400 cm/s AV VTI:            0.290 m AV Peak Grad:      5.0 mmHg AV Mean Grad:      3.0 mmHg LVOT Vmax:         98.50 cm/s LVOT Vmean:        72.200 cm/s LVOT VTI:          0.269 m LVOT/AV VTI ratio: 0.93  AORTA Ao Root diam: 3.00 cm MITRAL VALVE                TRICUSPID VALVE MV Area (PHT): 5.84 cm     TR Peak grad:   36.7 mmHg MV Decel Time: 130 msec     TR Vmax:        303.00 cm/s MV E velocity: 132.00 cm/s MV A velocity: 61.90 cm/s   SHUNTS MV E/A ratio:  2.13         Systemic VTI:  0.27 m                             Systemic Diam: 1.90 cm Zoila Shutter MD Electronically signed by Zoila Shutter MD Signature Date/Time:  04/10/2023/11:29:01 AM    Final    CT Head Wo Contrast Result Date: 04/09/2023 CLINICAL DATA:  Headache, increasing frequency or severity. EXAM: CT HEAD WITHOUT CONTRAST TECHNIQUE: Contiguous axial  images were obtained from the base of the skull through the vertex without intravenous contrast. RADIATION DOSE REDUCTION: This exam was performed according to the departmental dose-optimization program which includes automated exposure control, adjustment of the mA and/or kV according to patient size and/or use of iterative reconstruction technique. COMPARISON:  CT scan head from 02/01/2021. FINDINGS: Brain: No evidence of acute infarction, hemorrhage, hydrocephalus, extra-axial collection or mass lesion/mass effect. There is bilateral periventricular hypodensity, which is non-specific but most likely seen in the settings of microvascular ischemic changes. Mild in extent. otherwise normal appearance of brain parenchyma. Ventricles are normal. Cerebral volume is age appropriate. Vascular: No hyperdense vessel or unexpected calcification. Intracranial arteriosclerosis. Skull: Normal. Negative for fracture or focal lesion. Redemonstration of postsurgical changes from prior left occipital parietal craniotomy. Sinuses/Orbits: No acute finding. Other: Visualized mastoid air cells are unremarkable. No mastoid effusion. IMPRESSION: *No acute intracranial abnormality. Electronically Signed   By: Jules Schick M.D.   On: 04/09/2023 16:08   DG Chest 2 View Result Date: 04/09/2023 CLINICAL DATA:  Shortness of breath. EXAM: CHEST - 2 VIEW COMPARISON:  07/20/2021. FINDINGS: There are atelectatic changes at the lung bases, right more than left. There is subtle blunting of right costophrenic angle, suggesting trace right pleural effusion. Bilateral lung fields are otherwise clear. Left costophrenic angle is clear. Stable cardio-mediastinal silhouette. No acute osseous abnormalities. Multiple old healed right posterolateral rib  fractures noted. The soft tissues are within normal limits. IMPRESSION: Atelectatic changes at the lung bases, right more than left. Trace right pleural effusion. Electronically Signed   By: Jules Schick M.D.   On: 04/09/2023 16:05   Scheduled Meds:  amiodarone  200 mg Oral Daily   amLODipine  5 mg Oral Daily   apixaban  5 mg Oral BID   busPIRone  5 mg Oral BID   [START ON 04/11/2023] citalopram  20 mg Oral Daily   furosemide  40 mg Intravenous BID   ipratropium-albuterol  3 mL Nebulization Q6H   losartan  25 mg Oral Daily   methylPREDNISolone (SOLU-MEDROL) injection  80 mg Intravenous Q12H   metoprolol succinate  12.5 mg Oral Daily   montelukast  10 mg Oral Daily   pravastatin  20 mg Oral q1800   terazosin  5 mg Oral Daily   Continuous Infusions:  azithromycin 500 mg (04/09/23 2226)    LOS: 1 day   Marguerita Merles, DO [facility] Available via Epic secure chat 7am-7pm After these hours, please refer to coverage provider listed on [email or web address] 04/10/2023, 6:23 PM

## 2023-04-10 NOTE — Hospital Course (Addendum)
 The patient is a 73 year old Caucasian male with a past medical history significant for but not limited to paroxysmal atrial fibrillation on anticoagulation, chronic diastolic CHF, history of intracranial hemorrhage and September 2022, BPH, chronic hyponatremia, essential hypertension, hyperlipidemia, who presented from med [facility] ED for acute shortness of breath that has been worsening over the last several weeks.  Being admitted and treated for acute respiratory failure with hypoxia in the setting of an acute exacerbation of COPD and acute exacerbation on chronic diastolic CHF. Cardiology evaluating and are in a right heart catheterization to evaluate his volume status and degree of diastolic dysfunction.  Assessment and Plan:  Acute Exacerbation of Diastolic CHF / Severe TR / Mild to Moderate MR / Pulmonary HTN: BNP elevated at 775.4 on Admission. ECHO this visit shows EF of 55-60% Grade 3 DD. Diuresis with IV Lasix 40 mg BID reduced to IV 40 mg Daily on 04/13/23 but now titrated back up to 40 mg BID per Cardiology given continued Volume Overload. Strict Is and Os and Daily Weights. Repeat CXR as below.  -Holding his beta-blocker with Metoprolol Succinate 25 mg p.o. daily given his bradycardia; Cardiology initiated SGLT2 inhibitor with Empagliflozin 10 mg p.o. daily.  Plan is for RHC and underwent it this AM and it showed he was still volume overloaded as it showed: RA 15, RV 52/15, PA 52/30, PCWP 28. Fick CO/CI 4.5/2.1, thermodilution 5.7/2.7. -Further Care per Cardiology   Acute Exacerbation of COPD: C/w Steroids with Solumedrol and started weaning from IV Methylprednisolone 80 mg BID to 60 mg BID and now is on 40 mg IV BID. Completed IV Azithromycin x 5 Days. Flutter Valve and Incentive Spirometry. DuoNeb q6h now stopped. C/w Montelukast 10 mg po Daily C/w Albuterol Neb 2.5 mg q4hprn. CXR done and showed Bibasilar ATX as well as a Small R sided Pleural Effusion; Last CXR done day before  yesterday and showed "Worsening right pleural effusion and right lower lobe atelectasis or infiltrate." Repeat CXR in the AM   Acute Respiratory Failure with Hypoxia: In the setting of Above. C/w Diuresis and Treatment. Wean O2 as tolerated as he does not wear oxygen at home. SpO2: 99 % O2 Flow Rate (L/min): 2 L/min; Will need an Ambulatory Home O2 Screen prior to D/C; was able to be weaned off of O2  PAF: Has a CHA2DS2-VASc of 3. Metoprolol Succinate held. C/w Amiodarone 200 mg po Daily. C/w Anticoagulation with Apixaban 5 mg po BID. ECHOCardiogram as above; Cardiology is following and appreciate further management and patient underwent RHC this AM   Essential HTN: C/w Amlodipine 5 mg po Daily; Holding Metoprolol Succinate 12.5 mg po Daily given Bradycardia. C/w Terazosin 5 mg po daily. CTM BP per Protocol. Last BP Reading was 167/80  HLD: C/w Lovastatin substitution for Pravastatin 20 mg po Daily  HypoNa+: Likely 2/2 to Hypervolemia but has chronic hypoosmolar hyponatremia. B/L ranging from 130-134. C/w Diuresis. Na+ went from 132 -> 130 -> 134. CTM and Trend and repeat CMP in the AM   BPH: C/w Terazosin 5 mg po Daily   Normocytic Anemia: Hgb/Hct went from 12.7/39.8 -> 12.4/37.6 -> 12.4/38.0 -> 12.1/37.5 -> 14.3/43.7 -> 14.6/44.9. Anemia Panel showed an iron level of 35, UIBC 351, TIBC 386, saturation ration 9%, ferritin level 38, folate 20.6 and a vitamin B12 level of 955 CTM for S/Sx of Bleeding; No overt bleeding noted. Repeat CBC in the AM   Generalized Anxiety Disorder: C/w Buspirone 5 mg po BID and Citalopram  20 mg po Daily   Abnormal LFTs: Mild and likely in the setting of Volume Overload but is worsening. AST is now 45 -> 42 -> 56 -> 67 -> 67 and ALT is now 53 -> 55 -> 60 -> 97 -> 107. CTM and Trend and will obtain a right upper quadrant ultrasound as well as an acute hepatitis panel in the AM. Repeat CMP in AM.  Hypoalbuminemia: Patient's Albumin level went from 3.7 -> 3.8 -> 3.4 ->  3.6 -> 3.5. CTM and Trend and repeat CMP in the AM   Class I Obesity: Complicates overall prognosis and care. Estimated body mass index is 31.47 kg/m as calculated from the following:   Height as of this encounter: 5\' 9"  (1.753 m).   Weight as of this encounter: 96.7 kg. Weight Loss and Dietary Counseling given

## 2023-04-10 NOTE — Plan of Care (Signed)

## 2023-04-10 NOTE — Progress Notes (Signed)
 Ok to reduce celexa to 20mg  qday due to risk of qtc prologation in age>74yo  Beemer, PharmD, [place], AAHIVP, CPP Infectious Disease Pharmacist 04/10/2023 1:44 PM

## 2023-04-11 ENCOUNTER — Inpatient Hospital Stay (HOSPITAL_COMMUNITY)

## 2023-04-11 DIAGNOSIS — I10 Essential (primary) hypertension: Secondary | ICD-10-CM | POA: Diagnosis not present

## 2023-04-11 DIAGNOSIS — I5033 Acute on chronic diastolic (congestive) heart failure: Secondary | ICD-10-CM | POA: Diagnosis not present

## 2023-04-11 DIAGNOSIS — J9601 Acute respiratory failure with hypoxia: Secondary | ICD-10-CM

## 2023-04-11 DIAGNOSIS — N400 Enlarged prostate without lower urinary tract symptoms: Secondary | ICD-10-CM | POA: Diagnosis not present

## 2023-04-11 DIAGNOSIS — R001 Bradycardia, unspecified: Secondary | ICD-10-CM

## 2023-04-11 DIAGNOSIS — E871 Hypo-osmolality and hyponatremia: Secondary | ICD-10-CM | POA: Diagnosis not present

## 2023-04-11 DIAGNOSIS — J441 Chronic obstructive pulmonary disease with (acute) exacerbation: Secondary | ICD-10-CM | POA: Diagnosis not present

## 2023-04-11 DIAGNOSIS — I272 Pulmonary hypertension, unspecified: Secondary | ICD-10-CM

## 2023-04-11 LAB — CBC WITH DIFFERENTIAL/PLATELET
Abs Immature Granulocytes: 0.02 10*3/uL (ref 0.00–0.07)
Basophils Absolute: 0 10*3/uL (ref 0.0–0.1)
Basophils Relative: 0 %
Eosinophils Absolute: 0 10*3/uL (ref 0.0–0.5)
Eosinophils Relative: 0 %
HCT: 38 % — ABNORMAL LOW (ref 39.0–52.0)
Hemoglobin: 12.4 g/dL — ABNORMAL LOW (ref 13.0–17.0)
Immature Granulocytes: 0 %
Lymphocytes Relative: 5 %
Lymphs Abs: 0.3 10*3/uL — ABNORMAL LOW (ref 0.7–4.0)
MCH: 28.6 pg (ref 26.0–34.0)
MCHC: 32.6 g/dL (ref 30.0–36.0)
MCV: 87.8 fL (ref 80.0–100.0)
Monocytes Absolute: 0.2 10*3/uL (ref 0.1–1.0)
Monocytes Relative: 3 %
Neutro Abs: 6.1 10*3/uL (ref 1.7–7.7)
Neutrophils Relative %: 92 %
Platelets: 184 10*3/uL (ref 150–400)
RBC: 4.33 MIL/uL (ref 4.22–5.81)
RDW: 14.2 % (ref 11.5–15.5)
WBC: 6.6 10*3/uL (ref 4.0–10.5)
nRBC: 0 % (ref 0.0–0.2)

## 2023-04-11 LAB — IRON AND TIBC
Iron: 35 ug/dL — ABNORMAL LOW (ref 45–182)
Saturation Ratios: 9 % — ABNORMAL LOW (ref 17.9–39.5)
TIBC: 386 ug/dL (ref 250–450)
UIBC: 351 ug/dL

## 2023-04-11 LAB — COMPREHENSIVE METABOLIC PANEL WITH GFR
ALT: 55 U/L — ABNORMAL HIGH (ref 0–44)
AST: 42 U/L — ABNORMAL HIGH (ref 15–41)
Albumin: 3.8 g/dL (ref 3.5–5.0)
Alkaline Phosphatase: 69 U/L (ref 38–126)
Anion gap: 11 (ref 5–15)
BUN: 22 mg/dL (ref 8–23)
CO2: 28 mmol/L (ref 22–32)
Calcium: 9 mg/dL (ref 8.9–10.3)
Chloride: 94 mmol/L — ABNORMAL LOW (ref 98–111)
Creatinine, Ser: 0.73 mg/dL (ref 0.61–1.24)
GFR, Estimated: >60 mL/min (ref >60)
Glucose, Bld: 155 mg/dL — ABNORMAL HIGH (ref 70–99)
Potassium: 4.3 mmol/L (ref 3.5–5.1)
Sodium: 133 mmol/L — ABNORMAL LOW (ref 135–145)
Total Bilirubin: 0.8 mg/dL (ref 0.0–1.2)
Total Protein: 6.7 g/dL (ref 6.5–8.1)

## 2023-04-11 LAB — FOLATE: Folate: 28.6 ng/mL (ref >5.9)

## 2023-04-11 LAB — RETICULOCYTES
Immature Retic Fract: 10 % (ref 2.3–15.9)
RBC.: 4.35 MIL/uL (ref 4.22–5.81)
Retic Count, Absolute: 62.6 10*3/uL (ref 19.0–186.0)
Retic Ct Pct: 1.4 % (ref 0.4–3.1)

## 2023-04-11 LAB — PHOSPHORUS: Phosphorus: 3.4 mg/dL (ref 2.5–4.6)

## 2023-04-11 LAB — VITAMIN B12: Vitamin B-12: 955 pg/mL — ABNORMAL HIGH (ref 180–914)

## 2023-04-11 LAB — MAGNESIUM: Magnesium: 2.2 mg/dL (ref 1.7–2.4)

## 2023-04-11 LAB — FERRITIN: Ferritin: 38 ng/mL (ref 24–336)

## 2023-04-11 MED ORDER — BUTALBITAL-APAP-CAFFEINE 50-325-40 MG PO TABS
1.0000 | ORAL_TABLET | Freq: Four times a day (QID) | ORAL | Status: DC | PRN
  Administered 2023-04-11 – 2023-04-17 (×16): 1 via ORAL
  Filled 2023-04-11 (×16): qty 1

## 2023-04-11 MED ORDER — METHYLPREDNISOLONE SODIUM SUCC 125 MG IJ SOLR: 60.0000 mg | Freq: Two times a day (BID) | INTRAMUSCULAR | Status: DC

## 2023-04-11 MED ORDER — METHYLPREDNISOLONE SODIUM SUCC 125 MG IJ SOLR
60.0000 mg | Freq: Two times a day (BID) | INTRAMUSCULAR | Status: DC
  Administered 2023-04-11 – 2023-04-14 (×6): 60 mg via INTRAVENOUS
  Filled 2023-04-11 (×6): qty 2

## 2023-04-11 NOTE — Care Management Important Message (Signed)
 Important Message  Patient Details  Name: Samuel Barnes MRN: [id number] Date of Birth: [birth year]   Important Message Given:  Yes - Medicare IM     Renie Ora 04/11/2023, 10:28 AM

## 2023-04-11 NOTE — Progress Notes (Addendum)
 PROGRESS NOTE    [name]  ZOX:[id number] DOB: Jan 01, 1951 DOA: 04/09/2023 PCP: [name], PA   Brief Narrative:  The patient is a 73 year old Caucasian male with a past medical history significant for but not limited to paroxysmal atrial fibrillation on anticoagulation, chronic diastolic CHF, history of intracranial hemorrhage and September 2022, BPH, chronic hyponatremia, essential hypertension, hyperlipidemia, who presented from med [facility] ED for acute shortness of breath that has been worsening over the last several weeks.  Being admitted and treated for acute respiratory failure with hypoxia in the setting of an acute exacerbation of COPD and acute exacerbation on chronic diastolic CHF.  I have asked cardiology to further evaluate and are considering a right heart catheterization.  Assessment and Plan:  Acute Exacerbation of Diastolic CHF / Severe TR / Mild to Moderate MR / Pulmonary HTN: BNP elevated at 775.4 on Admission. ECHO this visit shows EF of 55-60% Grade 3 DD. BB as Below. Increased Diuresis from IV Lasix 20 mg BID to IV 40 mg BID. Strict Is and Os and Daily Weights. Repeat CXR as below. Cardiology consulted for further evaluation and recommendations and they are considering a right heart cath to guide diuresis.  Holding his beta-blocker with Metoprolol  Succinate 25 mg p.o. daily given his bradycardia cardiology recommends considering starting spironolactone 12.5 mg p.o. daily and an SGLT2 inhibitor  Acute Exacerbation of COPD: C/w Steroids with Solumedrol and start weaning from IV Methylprednisolone 80 mg BID to 60 mg BID. Initiated on IV Azithromycin. Flutter Valve and Incentive Spirometry. DuoNeb q6h now stopped. C/w Montelukast 10 mg po Daily C/w Albuterol Neb 2.5 mg q4hprn. CXR done and showed Bibasilar ATX as well as a Small R sided Pleural Effusion; repeat CXR done and showed "Mild right basilar atelectasis is noted with small right pleural  effusion."  Acute Respiratory Failure with Hypoxia: In the setting of Above. C/w Diuresis and Treatment. Wean O2 as tolerated as he does not wear oxygen at home. SpO2: 94 %; O2 Flow Rate (L/min): 2 L/min. Will need an Ambulatory Home O2 Screen prior to D/C; was able to be weaned off of O2  PAF: Has a CHA2DS2-VASc of 3. C/w Metoprolol Succinate 12.5 mg po Daily and Amiodarone 200 mg po Daily. C/w Anticoagulation with Apixaban 5 mg po BID. ECHOCardiogram as above; cardiology is following and appreciate further management  Essential HTN: C/w Amlodipine 5 mg po Daily; Holding Metoprolol Succinate 12.5 mg po Daily given Bradycardia. C/w Terazosin 5 mg po daily. CTM BP per Protocol. Last BP Reading was 143/61  HLD: C/w Lovastatin substitution for Pravastatin 20 mg po Daily  HypoNa+: Likely 2/2 to Hypervolemia but has chronic hypoosmolar hyponatremia. B/L ranging from 130-134. C/w Diuresis. Na+ went from 132 -> 130 -> 133. CTM and Trend and repeat CMP in the AM   BPH: C/w Terazosin 5 mg po Daily   Normocytic Anemia: Hgb/Hct went from 12.7/39.8 -> 12.4/37.6 -> 12.4/38.0.  Anemia Panel showed an iron level of 35, UIBC 351, TIBC 386, saturation ration 9%, ferritin level 38, folate 20.6 and a vitamin B12 level of 955 CTM for S/Sx of Bleeding; No overt bleeding noted. Repeat CBC in the AM   Generalized Anxiety Disorder: C/w Buspirone 5 mg po BID and Citalopram 20 mg po Daily   Abnormal LFTs: Mild and likely in the setting of Volume Overload. AST is now 45 -> 42 and ALT is now 53 -> 55. CTM and Trend and if not improving will  obtain a right upper quadrant ultrasound as well as an acute hepatitis panel. Repeat CMP in AM.  Class I Obesity: Complicates overall prognosis and care. Estimated body mass index is 32.22 kg/m as calculated from the following:   Height as of this encounter: 5\' 9"  (1.753 m).   Weight as of this encounter: 99 kg. Weight Loss and Dietary Counseling given   DVT prophylaxis:  apixaban  (ELIQUIS) tablet 5 mg    Code Status: Full Code Family Communication: No family present at bedside  Disposition Plan:  Level of care: Telemetry Cardiac Status is: Inpatient Remains inpatient appropriate because: Continues to get diuresed and needs further Cardiology evaluation and clearance   Consultants:  Cardiology  Procedures:  As delineated as above; ECHOCardiogram  Antimicrobials:  Anti-infectives (From admission, onward)    Start     Dose/Rate Route Frequency Ordered Stop   04/09/23 2200  azithromycin (ZITHROMAX) 500 mg in sodium chloride 0.9 % 250 mL IVPB       Note to Pharmacy: (In setting of acute copd exac)   500 mg 250 mL/hr over 60 Minutes Intravenous Every 24 hours 04/09/23 2053 04/14/23 2159       Subjective: Seen and examined at bedside states doing little bit better and has been weaned off of supplemental oxygen.  States he is lost 3 pounds since being nervous.  No nausea or vomiting.  Denied a headache to me but did complain of a headache to the cardiology PA.  No other concerns or complaints at this time.  Objective: Vitals:   04/11/23 0000 04/11/23 0502 04/11/23 0716 04/11/23 0825  BP: (!) 149/109 (!) 134/98  (!) 140/69  Pulse: 68 60 (!) 56 (!) 54  Resp:  16  18  Temp:  97.7 F (36.5 C)  97.6 F (36.4 C)  TempSrc:  Oral  Axillary  SpO2: 91% 94% 93% 94%  Weight:  99 kg    Height:        Intake/Output Summary (Last 24 hours) at 04/11/2023 1417 Last data filed at 04/11/2023 1610 Gross per 24 hour  Intake 1220 ml  Output 2175 ml  Net -955 ml   Filed Weights   04/09/23 1951 04/10/23 0615 04/11/23 0502  Weight: 100.3 kg 99.7 kg 99 kg   Examination: Physical Exam:  Constitutional: WN/WD obese Caucasian male in no acute distress Respiratory: Diminished to auscultation bilaterally.  Coarse breath sounds and has some slight crackles.  But no appreciable wheezing, rhonchi or rales. Normal respiratory effort and patient is not tachypenic. No accessory  muscle use.  No longer wearing supplemental oxygen via nasal cannula and has unlabored breathing Cardiovascular: RRR, no murmurs / rubs / gallops. S1 and S2 auscultated.  Mild lower extremity edema Abdomen: Soft, non-tender, distended secondary to body habitus. Bowel sounds positive.  GU: Deferred. Musculoskeletal: No clubbing / cyanosis of digits/nails. No joint deformity upper and lower extremities. Skin: No rashes, lesions, ulcers on limited skin evaluation. No induration; Warm and dry.  Neurologic: CN 2-12 grossly intact with no focal deficits. Romberg sign and cerebellar reflexes not assessed.  Psychiatric: Normal judgment and insight. Alert and oriented x 3. Normal mood and appropriate affect.   Data Reviewed: I have personally reviewed following labs and imaging studies  CBC: Recent Labs  Lab 04/09/23 1413 04/10/23 0531 04/11/23 0530  WBC 4.2 4.3 6.6  NEUTROABS  --  3.7 6.1  HGB 12.7* 12.4* 12.4*  HCT 39.8 37.6* 38.0*  MCV 89.4 87.2 87.8  PLT 165 158  184   Basic Metabolic Panel: Recent Labs  Lab 04/09/23 1413 04/10/23 0531 04/11/23 0530  NA 132* 130* 133*  K 3.9 4.2 4.3  CL 95* 94* 94*  CO2 28 26 28   GLUCOSE 135* 132* 155*  BUN 15 16 22   CREATININE 0.60* 0.62 0.73  CALCIUM 8.7* 8.7* 9.0  MG  --  2.1 2.2  PHOS  --  2.9 3.4   GFR: Estimated Creatinine Clearance: 95.4 mL/min (by C-G formula based on SCr of 0.73 mg/dL). Liver Function Tests: Recent Labs  Lab 04/10/23 0531 04/11/23 0530  AST 45* 42*  ALT 53* 55*  ALKPHOS 74 69  BILITOT 1.0 0.8  PROT 6.5 6.7  ALBUMIN 3.7 3.8   No results for input(s): "LIPASE", "AMYLASE" in the last 168 hours. No results for input(s): "AMMONIA" in the last 168 hours. Coagulation Profile: No results for input(s): "INR", "PROTIME" in the last 168 hours. Cardiac Enzymes: No results for input(s): "CKTOTAL", "CKMB", "CKMBINDEX", "TROPONINI" in the last 168 hours. BNP (last 3 results) No results for input(s): "PROBNP" in the  last 8760 hours. HbA1C: No results for input(s): "HGBA1C" in the last 72 hours. CBG: Recent Labs  Lab 04/09/23 1414  GLUCAP 148*   Lipid Profile: No results for input(s): "CHOL", "HDL", "LDLCALC", "TRIG", "CHOLHDL", "LDLDIRECT" in the last 72 hours. Thyroid Function Tests: Recent Labs    04/09/23 2109  TSH 0.953   Anemia Panel: Recent Labs    04/11/23 0530  VITAMINB12 955*  FOLATE 28.6  FERRITIN 38  TIBC 386  IRON 35*  RETICCTPCT 1.4   Sepsis Labs: Recent Labs  Lab 04/10/23 0531  PROCALCITON <0.10   Recent Results (from the past 240 hours)  Resp panel by RT-PCR (RSV, Flu A&B, Covid) Anterior Nasal Swab     Status: None   Collection Time: 04/09/23  2:13 PM   Specimen: Anterior Nasal Swab  Result Value Ref Range Status   SARS Coronavirus 2 by RT PCR NEGATIVE NEGATIVE Final    Comment: (NOTE) SARS-CoV-2 target nucleic acids are NOT DETECTED.  The SARS-CoV-2 RNA is generally detectable in upper respiratory specimens during the acute phase of infection. The lowest concentration of SARS-CoV-2 viral copies this assay can detect is 138 copies/mL. A negative result does not preclude SARS-Cov-2 infection and should not be used as the sole basis for treatment or other patient management decisions. A negative result may occur with  improper specimen collection/handling, submission of specimen other than nasopharyngeal swab, presence of viral mutation(s) within the areas targeted by this assay, and inadequate number of viral copies(<138 copies/mL). A negative result must be combined with clinical observations, patient history, and epidemiological information. The expected result is Negative.  Fact Sheet for Patients:  BloggerCourse.com  Fact Sheet for Healthcare Providers:  SeriousBroker.it  This test is no t yet approved or cleared by the [place] FDA and  has been authorized for detection and/or diagnosis of  SARS-CoV-2 by FDA under an Emergency Use Authorization (EUA). This EUA will remain  in effect (meaning this test can be used) for the duration of the COVID-19 declaration under Section 564(b)(1) of the Act, 21 U.S.C.section 360bbb-3(b)(1), unless the authorization is terminated  or revoked sooner.       Influenza A by PCR NEGATIVE NEGATIVE Final   Influenza B by PCR NEGATIVE NEGATIVE Final    Comment: (NOTE) The Xpert Xpress SARS-CoV-2/FLU/RSV plus assay is intended as an aid in the diagnosis of influenza from Nasopharyngeal swab specimens and should not  be used as a sole basis for treatment. Nasal washings and aspirates are unacceptable for Xpert Xpress SARS-CoV-2/FLU/RSV testing.  Fact Sheet for Patients: BloggerCourse.com  Fact Sheet for Healthcare Providers: SeriousBroker.it  This test is not yet approved or cleared by the [place] FDA and has been authorized for detection and/or diagnosis of SARS-CoV-2 by FDA under an Emergency Use Authorization (EUA). This EUA will remain in effect (meaning this test can be used) for the duration of the COVID-19 declaration under Section 564(b)(1) of the Act, 21 U.S.C. section 360bbb-3(b)(1), unless the authorization is terminated or revoked.     Resp Syncytial Virus by PCR NEGATIVE NEGATIVE Final    Comment: (NOTE) Fact Sheet for Patients: BloggerCourse.com  Fact Sheet for Healthcare Providers: SeriousBroker.it  This test is not yet approved or cleared by the [place] FDA and has been authorized for detection and/or diagnosis of SARS-CoV-2 by FDA under an Emergency Use Authorization (EUA). This EUA will remain in effect (meaning this test can be used) for the duration of the COVID-19 declaration under Section 564(b)(1) of the Act, 21 U.S.C. section 360bbb-3(b)(1), unless the authorization is terminated  or revoked.  Performed at [facility], [address] [place], [place] [address]     Radiology Studies: DG CHEST PORT 1 VIEW Result Date: 04/11/2023 CLINICAL DATA:  Shortness of breath. EXAM: PORTABLE CHEST 1 VIEW COMPARISON:  April 09, 2023. FINDINGS: Stable cardiomegaly. Mild right basilar atelectasis is noted with small right pleural effusion. Old right rib fractures are noted. Left lung is clear. IMPRESSION: Mild right basilar atelectasis is noted with small right pleural effusion. Electronically Signed   By: Lupita Raider M.D.   On: 04/11/2023 08:17   ECHOCARDIOGRAM COMPLETE Result Date: 04/10/2023    ECHOCARDIOGRAM REPORT   Patient Name:   Samuel Barnes Date of Exam: 04/10/2023 Medical Rec #:  [id number]             Height:       69.0 in Accession #:    [id number]            Weight:       219.9 lb Date of Birth:  [birth year]             BSA:          2.151 m Patient Age:    72 years              BP:           157/75 mmHg Patient Gender: M                     HR:           54 bpm. Exam Location:  Inpatient Procedure: 2D Echo, Cardiac Doppler and Color Doppler (Both Spectral and Color            Flow Doppler were utilized during procedure). Indications:    CHF  History:        Patient has prior history of Echocardiogram examinations, most                 recent 07/29/2020. CHF, COPD, Arrythmias:Atrial Fibrillation;                 Risk Factors:Hypertension.  Sonographer:    Darlys Gales Referring Phys: [id number] JUSTIN B HOWERTER IMPRESSIONS  1. Left ventricular ejection fraction, by estimation, is 55 to 60%. The left ventricle has normal function. The left ventricle has  no regional wall motion abnormalities. Left ventricular diastolic parameters are consistent with Grade III diastolic dysfunction (restrictive). Elevated left ventricular end-diastolic pressure.  2. Right ventricular systolic function is normal. The right ventricular size is mildly enlarged. There is moderately elevated  pulmonary artery systolic pressure. The estimated right ventricular systolic pressure is 51.7 mmHg.  3. Left atrial size was severely dilated.  4. Right atrial size was severely dilated.  5. The mitral valve is abnormal. Mild to moderate mitral valve regurgitation.  6. Tricuspid valve regurgitation is moderate to severe.  7. The aortic valve is tricuspid. Aortic valve regurgitation is trivial.  8. The inferior vena cava is dilated in size with <50% respiratory variability, suggesting right atrial pressure of 15 mmHg. Comparison(s): Changes from prior study are noted. 07/29/2020: LVEF 55-60%. FINDINGS  Left Ventricle: Left ventricular ejection fraction, by estimation, is 55 to 60%. The left ventricle has normal function. The left ventricle has no regional wall motion abnormalities. The left ventricular internal cavity size was normal in size. There is  no left ventricular hypertrophy. Left ventricular diastolic parameters are consistent with Grade III diastolic dysfunction (restrictive). Elevated left ventricular end-diastolic pressure. Right Ventricle: The right ventricular size is mildly enlarged. No increase in right ventricular wall thickness. Right ventricular systolic function is normal. There is moderately elevated pulmonary artery systolic pressure. The tricuspid regurgitant velocity is 3.03 m/s, and with an assumed right atrial pressure of 15 mmHg, the estimated right ventricular systolic pressure is 51.7 mmHg. Left Atrium: Left atrial size was severely dilated. Right Atrium: Right atrial size was severely dilated. Pericardium: There is no evidence of pericardial effusion. Mitral Valve: The mitral valve is abnormal. There is mild calcification of the anterior and posterior mitral valve leaflet(s). Mild to moderate mitral valve regurgitation. Tricuspid Valve: The tricuspid valve is grossly normal. Tricuspid valve regurgitation is moderate to severe. Aortic Valve: The aortic valve is tricuspid. Aortic valve  regurgitation is trivial. Aortic valve mean gradient measures 3.0 mmHg. Aortic valve peak gradient measures 5.0 mmHg. Aortic valve area, by VTI measures 2.63 cm. Pulmonic Valve: The pulmonic valve was normal in structure. Pulmonic valve regurgitation is not visualized. Aorta: The aortic root and ascending aorta are structurally normal, with no evidence of dilitation. Venous: The inferior vena cava is dilated in size with less than 50% respiratory variability, suggesting right atrial pressure of 15 mmHg. IAS/Shunts: No atrial level shunt detected by color flow Doppler.  LEFT VENTRICLE PLAX 2D LVIDd:         4.00 cm   Diastology LVIDs:         2.30 cm   LV e' medial:    8.27 cm/s LV PW:         0.90 cm   LV E/e' medial:  16.0 LV IVS:        0.80 cm   LV e' lateral:   8.38 cm/s LVOT diam:     1.90 cm   LV E/e' lateral: 15.8 LV SV:         76 LV SV Index:   35 LVOT Area:     2.84 cm  RIGHT VENTRICLE RV Basal diam:  4.60 cm RV Mid diam:    3.80 cm LEFT ATRIUM            Index        RIGHT ATRIUM           Index LA Vol (A4C): 120.0 ml 55.79 ml/m  RA Area:     36.30  cm                                     RA Volume:   137.50 ml 63.92 ml/m  AORTIC VALVE AV Area (Vmax):    2.49 cm AV Area (Vmean):   2.40 cm AV Area (VTI):     2.63 cm AV Vmax:           112.00 cm/s AV Vmean:          85.400 cm/s AV VTI:            0.290 m AV Peak Grad:      5.0 mmHg AV Mean Grad:      3.0 mmHg LVOT Vmax:         98.50 cm/s LVOT Vmean:        72.200 cm/s LVOT VTI:          0.269 m LVOT/AV VTI ratio: 0.93  AORTA Ao Root diam: 3.00 cm MITRAL VALVE                TRICUSPID VALVE MV Area (PHT): 5.84 cm     TR Peak grad:   36.7 mmHg MV Decel Time: 130 msec     TR Vmax:        303.00 cm/s MV E velocity: 132.00 cm/s MV A velocity: 61.90 cm/s   SHUNTS MV E/A ratio:  2.13         Systemic VTI:  0.27 m                             Systemic Diam: 1.90 cm Zoila Shutter MD Electronically signed by Zoila Shutter MD Signature Date/Time:  04/10/2023/11:29:01 AM    Final    CT Head Wo Contrast Result Date: 04/09/2023 CLINICAL DATA:  Headache, increasing frequency or severity. EXAM: CT HEAD WITHOUT CONTRAST TECHNIQUE: Contiguous axial images were obtained from the base of the skull through the vertex without intravenous contrast. RADIATION DOSE REDUCTION: This exam was performed according to the departmental dose-optimization program which includes automated exposure control, adjustment of the mA and/or kV according to patient size and/or use of iterative reconstruction technique. COMPARISON:  CT scan head from 02/01/2021. FINDINGS: Brain: No evidence of acute infarction, hemorrhage, hydrocephalus, extra-axial collection or mass lesion/mass effect. There is bilateral periventricular hypodensity, which is non-specific but most likely seen in the settings of microvascular ischemic changes. Mild in extent. otherwise normal appearance of brain parenchyma. Ventricles are normal. Cerebral volume is age appropriate. Vascular: No hyperdense vessel or unexpected calcification. Intracranial arteriosclerosis. Skull: Normal. Negative for fracture or focal lesion. Redemonstration of postsurgical changes from prior left occipital parietal craniotomy. Sinuses/Orbits: No acute finding. Other: Visualized mastoid air cells are unremarkable. No mastoid effusion. IMPRESSION: *No acute intracranial abnormality. Electronically Signed   By: Jules Schick M.D.   On: 04/09/2023 16:08   DG Chest 2 View Result Date: 04/09/2023 CLINICAL DATA:  Shortness of breath. EXAM: CHEST - 2 VIEW COMPARISON:  07/20/2021. FINDINGS: There are atelectatic changes at the lung bases, right more than left. There is subtle blunting of right costophrenic angle, suggesting trace right pleural effusion. Bilateral lung fields are otherwise clear. Left costophrenic angle is clear. Stable cardio-mediastinal silhouette. No acute osseous abnormalities. Multiple old healed right posterolateral rib  fractures noted. The soft tissues are within normal limits. IMPRESSION: Atelectatic changes at the lung bases,  right more than left. Trace right pleural effusion. Electronically Signed   By: Jules Schick M.D.   On: 04/09/2023 16:05   Scheduled Meds:  amiodarone  200 mg Oral Daily   amLODipine  5 mg Oral Daily   apixaban  5 mg Oral BID   busPIRone  5 mg Oral BID   citalopram  20 mg Oral Daily   furosemide  40 mg Intravenous BID   losartan  25 mg Oral Daily   methylPREDNISolone (SOLU-MEDROL) injection  60 mg Intravenous Q12H   montelukast  10 mg Oral Daily   pravastatin  20 mg Oral q1800   terazosin  5 mg Oral Daily   Continuous Infusions:  azithromycin 250 mL/hr at 04/11/23 0300    LOS: 2 days   Marguerita Merles, DO [facility] Available via Epic secure chat 7am-7pm After these hours, please refer to coverage provider listed on [email or web address] 04/11/2023, 2:17 PM

## 2023-04-11 NOTE — Consult Note (Signed)
 Cardiology Consultation   Patient ID: Samuel Barnes MRN: [id number]; DOB: [birth year]  Admit date: 04/09/2023 Date of Consult: 04/11/2023  PCP:  [name], PA   [facility] Providers Cardiologist:  Armanda Magic, MD      Patient Profile:   Samuel Barnes is a 73 y.o. 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 Marland Mcalpine.  History of Present Illness:   Samuel Barnes was admitted 07/2020 with acute on chronic diastolic heart failure found to be in new onset atrial fibrillation and pleural effusion.  He was diuresed and subsequently underwent TEE-cardioversion 08/02/2020.  LVEF 55-60%.  Unfortunately he had only a brief restoration of sinus rhythm and reverted back to A-fib. He was started on amiodarone and anticoagulated with eliquis.  Nuclear stress test 07/2020 showed fixed defect involving the inferior septal myocardium, no definite evidence of reversibility to suggest acute ischemia.   He has a history of alcohol abuse and was admitted 08/2020 for Wernicke's encephalopathy and syncope.   Apparently he had been feeling dizzy and lightheaded prompting reduction of his lopressor. Unfortunately, he had another near syncopal episode and fall due to dizziness prompting hospitalization 09/2020 found to have ICH.  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 He was discharged and followed closely with neurosurgery. He was hospitalized 12/2020 ad underwent dural AV fistula ligation.  Since craniotomy, he has had issues with balance preventing exercise and walking outside the home. He has also had chronic headache.  Repeat DCCV 10/31/20 unsuccessful.  Self-converted to SR when seen 11/14/20.   He was seen in the ER 08/2022 with hematuria. He was treated for a fungal  infection and discharged without admission.   He presented to [facility] ED 04/09/23 with shortness of breath felt related to acute on chronic diastolic heart failure and COPD exacerbation.   BNP 775 sCr 0.60 Albumin 3.7 AST 45 ALT 53  Echo this admission continues to show LVEF 55-60%, elevated LVEP, grade III DD, mildly enlarged RV with moderately elevated PASP, severe BAEm mild to moderate MR, moderate to severe TR.   He has chronic SOB but states his SOB worsened in the days leading up to ED evaluation. He also reported orthostatic lightheadedness. He denied LE swelling, but reports wheezing and orthopnea. Course complicated by suspected COPD exacerbation / wheezing. He was started on scheduled duonebs and solumedrol.   Cardiology consulted for management of HFpEF and pulmonary hypertension. Diuresing on 40 mg IV lasix BID. Has diuresed 2.4 L urine and is now down 3 lbs from peak weight.   He reports being short of breath for the past month.  He states that he only takes Lasix as needed for lower extremity swelling and he has not had lower extremity swelling so did not take any Lasix.  He follows with pulmonology but is not on a home inhaler regimen.  He denies recent respiratory illnesses.  He denies orthopnea to me but reports dyspnea on exertion that has worsened in the days preceding ER visit.  He has not had a sudden increase in weight.  He thinks that the scheduled breathing treatments have improved his shortness of breath.  He has not noted a difference after Lasix.  He snores at night and states he was evaluated for sleep apnea ?  8 years ago and was told he did not have OSA.  He reports gaining 8 lbs in the last 4 months.   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   Tobacco use 07/28/2020    Past Surgical History:  Procedure Laterality Date   APPLICATION OF CRANIAL NAVIGATION N/A 12/26/2020   Procedure: APPLICATION OF CRANIAL NAVIGATION;  Surgeon: [name], MD;  Location: [facility] OR;  Service: Neurosurgery;  Laterality: N/A;  RM 21   CARDIOVERSION N/A 08/02/2020   Procedure: CARDIOVERSION;  Surgeon: Meriam Sprague, MD;  Location: [facility] ENDOSCOPY;  Service: Cardiovascular;  Laterality: N/A;   CARDIOVERSION N/A 10/31/2020   Procedure: CARDIOVERSION;  Surgeon: Thomasene Ripple, DO;  Location: [facility] ENDOSCOPY;  Service: Cardiovascular;  Laterality: N/A;   CATARACT EXTRACTION Bilateral 11/2020   CRANIOTOMY Left 12/26/2020   Procedure: Left Subocciptal Cranitomy;  Surgeon: [name], MD;  Location: [facility] OR;  Service: Neurosurgery;  Laterality: Left;   IR ANGIO INTRA EXTRACRAN SEL COM CAROTID INNOMINATE BILAT MOD SED  10/04/2020   IR ANGIO VERTEBRAL SEL SUBCLAVIAN INNOMINATE BILAT MOD SED  10/03/2020   IR THORACENTESIS ASP PLEURAL SPACE W/IMG GUIDE  07/31/2020   IR US GUIDE VASC ACCESS RIGHT  10/04/2020   TEE WITHOUT CARDIOVERSION N/A 08/02/2020   Procedure: TRANSESOPHAGEAL ECHOCARDIOGRAM (TEE);  Surgeon: Meriam Sprague, MD;  Location: [facility] ENDOSCOPY;  Service: Cardiovascular;  Laterality: N/A;   TRANSURETHRAL RESECTION OF PROSTATE       Home Medications:  Prior to Admission medications   Medication Sig Start Date End Date Taking? Authorizing Provider  acetaminophen (TYLENOL) 500 MG tablet Take 1,000 mg by mouth every  6 (six) hours as needed (pain.).   Yes [provider]  amiodarone (PACERONE) 200 MG tablet Take 1 tablet (200 mg total) by mouth daily. 10/21/22  Yes Alver Sorrow, NP  amLODipine (NORVASC) 5 MG tablet Take 1 tablet (5 mg total) by mouth daily. 10/21/22  Yes Alver Sorrow, NP  apixaban (ELIQUIS) 5 MG TABS tablet Take 1 tablet (5 mg total) by mouth 2 (two) times daily. 03/12/23  Yes Turner, Cornelious Bryant, MD  busPIRone (BUSPAR) 5 MG tablet Take 5 mg by mouth 2 (two) times daily. 07/23/22  Yes [provider]  citalopram (CELEXA) 40 MG tablet Take 40 mg by mouth daily. 07/19/21  Yes [provider]  folic acid (FOLVITE) 1 MG tablet Take 1 tablet (1 mg total) by mouth daily. 08/09/20  Yes Glade Lloyd, MD  furosemide (LASIX) 20 MG tablet Take 1 tablet (20 mg total) by mouth as needed. 02/22/21  Yes Turner, Cornelious Bryant, MD  losartan (COZAAR) 25 MG tablet Take 1 tablet (25 mg total) by mouth daily. Pt needs to make appt with provider to continue getting refills 10/21/22  Yes Gillian Shields  S, NP  lovastatin (MEVACOR) 20 MG tablet Take 1 tablet (20 mg total) by mouth daily. 10/21/22  Yes Alver Sorrow, NP  metoprolol succinate (TOPROL-XL) 25 MG 24 hr tablet Take 1 tablet (25 mg total) by mouth daily. 10/21/22  Yes Alver Sorrow, NP  montelukast (SINGULAIR) 10 MG tablet Take 10 mg by mouth daily.   Yes [provider]  Multiple Vitamin (MULTIVITAMIN WITH MINERALS) TABS tablet Take 1 tablet by mouth daily. 08/09/20  Yes Glade Lloyd, MD  polyethylene glycol (MIRALAX / GLYCOLAX) 17 g packet Take 17 g by mouth daily as needed for mild constipation. 08/21/20  Yes Rolly Salter, MD  terazosin (HYTRIN) 5 MG capsule Take 5 mg by mouth in the morning.   Yes [provider]  thiamine (VITAMIN B-1) 100 MG tablet Take 100 mg by mouth daily. 01/21/21  Yes [provider]  umeclidinium bromide (INCRUSE ELLIPTA) 62.5 MCG/ACT AEPB Inhale 1 puff into the lungs  daily. Patient not taking: Reported on 10/21/2022 10/17/21   Omar Person, MD    Inpatient Medications: Scheduled Meds:  amiodarone  200 mg Oral Daily   amLODipine  5 mg Oral Daily   apixaban  5 mg Oral BID   busPIRone  5 mg Oral BID   citalopram  20 mg Oral Daily   furosemide  40 mg Intravenous BID   losartan  25 mg Oral Daily   methylPREDNISolone (SOLU-MEDROL) injection  80 mg Intravenous Q12H   metoprolol succinate  12.5 mg Oral Daily   montelukast  10 mg Oral Daily   pravastatin  20 mg Oral q1800   terazosin  5 mg Oral Daily   Continuous Infusions:  azithromycin 250 mL/hr at 04/11/23 0300   PRN Meds: acetaminophen **OR** acetaminophen, albuterol, melatonin, ondansetron (ZOFRAN) IV  Allergies:    Allergies  Allergen Reactions   Ciprofloxacin Shortness Of Breath and Other (See Comments)    Numbness in extremities    Gabapentin Other (See Comments)    Mental problems, depression, anger   Chlorhexidine Gluconate Other (See Comments)    Burning and itching    Social History:   Social History   Socioeconomic History   Marital status: Divorced    Spouse name: Not on file   Number of children: Not on file   Years of education: Not on file   Highest education level: Not on file  Occupational History   Not on file  Tobacco Use   Smoking status: Former    Current packs/day: 0.00    Average packs/day: 1.5 packs/day for 55.0 years (82.5 ttl pk-yrs)    Types: Cigarettes    Start date: 57    Quit date: 08/14/2020    Years since quitting: 2.6   Smokeless tobacco: Never   Tobacco comments:    Former smoker 01/30/21  Vaping Use   Vaping status: Never Used  Substance and Sexual Activity   Alcohol use: Not Currently    Alcohol/week: 1.0 standard drink of alcohol    Types: 1 Shots of liquor per week   Drug use: Never   Sexual activity: Not on file  Other Topics Concern   Not on file  Social History Narrative   Not on file   Social Drivers of Health    Financial Resource Strain: Not on file  Food Insecurity: No Food Insecurity (04/09/2023)   Hunger Vital Sign    Worried About Running Out of Food in the Last Year: Never true  Ran Out of Food in the Last Year: Never true  Transportation Needs: Unknown (04/09/2023)   PRAPARE - Administrator, Civil Service (Medical): No    Lack of Transportation (Non-Medical): Not on file  Physical Activity: Not on file  Stress: Not on file  Social Connections: Unknown (04/09/2023)   Social Connection and Isolation Panel [NHANES]    Frequency of Communication with Friends and Family: Never    Frequency of Social Gatherings with Friends and Family: Never    Attends Religious Services: Never    Database administrator or Organizations: Yes    Attends Banker Meetings: Patient declined    Marital Status: Patient declined  Intimate Partner Violence: Not At Risk (04/09/2023)   Humiliation, Afraid, Rape, and Kick questionnaire    Fear of Current or Ex-Partner: No    Emotionally Abused: No    Physically Abused: No    Sexually Abused: No    Family History:    Family History  Problem Relation Age of Onset   Coronary artery disease Mother    Hypertension Mother    Hyperlipidemia Mother    Ovarian cancer Mother    Ovarian cancer Sister    Liver cancer Sister    Liver cancer Brother      ROS:  Please see the history of present illness.   All other ROS reviewed and negative.     Physical Exam/Data:   Vitals:   04/11/23 0000 04/11/23 0502 04/11/23 0716 04/11/23 0825  BP: (!) 149/109 (!) 134/98  (!) 140/69  Pulse: 68 60 (!) 56 (!) 54  Resp:  16  18  Temp:  97.7 F (36.5 C)  97.6 F (36.4 C)  TempSrc:  Oral  Axillary  SpO2: 91% 94% 93% 94%  Weight:  99 kg    Height:        Intake/Output Summary (Last 24 hours) at 04/11/2023 0955 Last data filed at 04/11/2023 1610 Gross per 24 hour  Intake 1220 ml  Output 2475 ml  Net -1255 ml      04/11/2023    5:02 AM 04/10/2023     6:15 AM 04/09/2023    7:51 PM  Last 3 Weights  Weight (lbs) 218 lb 3.2 oz 219 lb 14.4 oz 221 lb 3.2 oz  Weight (kg) 98.975 kg 99.746 kg 100.336 kg     Body mass index is 32.22 kg/m.  General:  obese male in NAD HEENT: normal Neck: no JVD - exam difficult Vascular: No carotid bruits; Distal pulses 2+ bilaterally Cardiac:  normal S1, S2; RRR; no murmur  Lungs:  clear to auscultation bilaterally, no wheezing, rhonchi or rales  Abd: soft, nontender, no hepatomegaly  Ext: no edema Musculoskeletal:  No deformities, BUE and BLE strength normal and equal Skin: warm and dry  Neuro:  CNs 2-12 intact, no focal abnormalities noted Psych:  Normal affect   EKG:  The EKG was personally reviewed and demonstrates:  SB with HR 51, first degree heart block, iRBBB, anterior Q waves Telemetry:  Telemetry was personally reviewed and demonstrates:  sinus bradycardia in the 50s  Relevant CV Studies:  Echo 04/10/23:  1. Left ventricular ejection fraction, by estimation, is 55 to 60%. The  left ventricle has normal function. The left ventricle has no regional  wall motion abnormalities. Left ventricular diastolic parameters are  consistent with Grade III diastolic  dysfunction (restrictive). Elevated left ventricular end-diastolic  pressure.   2. Right ventricular systolic function is normal. The  right ventricular  size is mildly enlarged. There is moderately elevated pulmonary artery  systolic pressure. The estimated right ventricular systolic pressure is  51.7 mmHg.   3. Left atrial size was severely dilated.   4. Right atrial size was severely dilated.   5. The mitral valve is abnormal. Mild to moderate mitral valve  regurgitation.   6. Tricuspid valve regurgitation is moderate to severe.   7. The aortic valve is tricuspid. Aortic valve regurgitation is trivial.   8. The inferior vena cava is dilated in size with <50% respiratory  variability, suggesting right atrial pressure of 15 mmHg.    Laboratory Data:  High Sensitivity Troponin:  No results for input(s): "TROPONINIHS" in the last 720 hours.   Chemistry Recent Labs  Lab 04/09/23 1413 04/10/23 0531 04/11/23 0530  NA 132* 130* 133*  K 3.9 4.2 4.3  CL 95* 94* 94*  CO2 28 26 28   GLUCOSE 135* 132* 155*  BUN 15 16 22   CREATININE 0.60* 0.62 0.73  CALCIUM 8.7* 8.7* 9.0  MG  --  2.1 2.2  GFRNONAA >60 >60 >60  ANIONGAP 9 10 11     Recent Labs  Lab 04/10/23 0531 04/11/23 0530  PROT 6.5 6.7  ALBUMIN 3.7 3.8  AST 45* 42*  ALT 53* 55*  ALKPHOS 74 69  BILITOT 1.0 0.8   Lipids No results for input(s): "CHOL", "TRIG", "HDL", "LABVLDL", "LDLCALC", "CHOLHDL" in the last 168 hours.  Hematology Recent Labs  Lab 04/09/23 1413 04/10/23 0531 04/11/23 0530  WBC 4.2 4.3 6.6  RBC 4.45 4.31 4.33  4.35  HGB 12.7* 12.4* 12.4*  HCT 39.8 37.6* 38.0*  MCV 89.4 87.2 87.8  MCH 28.5 28.8 28.6  MCHC 31.9 33.0 32.6  RDW 14.2 14.1 14.2  PLT 165 158 184   Thyroid  Recent Labs  Lab 04/09/23 2109  TSH 0.953    BNP Recent Labs  Lab 04/09/23 1413  BNP 775.4*    DDimer No results for input(s): "DDIMER" in the last 168 hours.   Radiology/Studies:  DG CHEST PORT 1 VIEW Result Date: 04/11/2023 CLINICAL DATA:  Shortness of breath. EXAM: PORTABLE CHEST 1 VIEW COMPARISON:  April 09, 2023. FINDINGS: Stable cardiomegaly. Mild right basilar atelectasis is noted with small right pleural effusion. Old right rib fractures are noted. Left lung is clear. IMPRESSION: Mild right basilar atelectasis is noted with small right pleural effusion. Electronically Signed   By: Lupita Raider M.D.   On: 04/11/2023 08:17   ECHOCARDIOGRAM COMPLETE Result Date: 04/10/2023    ECHOCARDIOGRAM REPORT   Patient Name:   Samuel Barnes Date of Exam: 04/10/2023 Medical Rec #:  [id number]             Height:       69.0 in Accession #:    [id number]            Weight:       219.9 lb Date of Birth:  1950-09-06             BSA:          2.151 m Patient Age:     72 years              BP:           157/75 mmHg Patient Gender: M                     HR:  54 bpm. Exam Location:  Inpatient Procedure: 2D Echo, Cardiac Doppler and Color Doppler (Both Spectral and Color            Flow Doppler were utilized during procedure). Indications:    CHF  History:        Patient has prior history of Echocardiogram examinations, most                 recent 07/29/2020. CHF, COPD, Arrythmias:Atrial Fibrillation;                 Risk Factors:Hypertension.  Sonographer:    Darlys Gales Referring Phys: [id number] JUSTIN B HOWERTER IMPRESSIONS  1. Left ventricular ejection fraction, by estimation, is 55 to 60%. The left ventricle has normal function. The left ventricle has no regional wall motion abnormalities. Left ventricular diastolic parameters are consistent with Grade III diastolic dysfunction (restrictive). Elevated left ventricular end-diastolic pressure.  2. Right ventricular systolic function is normal. The right ventricular size is mildly enlarged. There is moderately elevated pulmonary artery systolic pressure. The estimated right ventricular systolic pressure is 51.7 mmHg.  3. Left atrial size was severely dilated.  4. Right atrial size was severely dilated.  5. The mitral valve is abnormal. Mild to moderate mitral valve regurgitation.  6. Tricuspid valve regurgitation is moderate to severe.  7. The aortic valve is tricuspid. Aortic valve regurgitation is trivial.  8. The inferior vena cava is dilated in size with <50% respiratory variability, suggesting right atrial pressure of 15 mmHg. Comparison(s): Changes from prior study are noted. 07/29/2020: LVEF 55-60%. FINDINGS  Left Ventricle: Left ventricular ejection fraction, by estimation, is 55 to 60%. The left ventricle has normal function. The left ventricle has no regional wall motion abnormalities. The left ventricular internal cavity size was normal in size. There is  no left ventricular hypertrophy. Left ventricular  diastolic parameters are consistent with Grade III diastolic dysfunction (restrictive). Elevated left ventricular end-diastolic pressure. Right Ventricle: The right ventricular size is mildly enlarged. No increase in right ventricular wall thickness. Right ventricular systolic function is normal. There is moderately elevated pulmonary artery systolic pressure. The tricuspid regurgitant velocity is 3.03 m/s, and with an assumed right atrial pressure of 15 mmHg, the estimated right ventricular systolic pressure is 51.7 mmHg. Left Atrium: Left atrial size was severely dilated. Right Atrium: Right atrial size was severely dilated. Pericardium: There is no evidence of pericardial effusion. Mitral Valve: The mitral valve is abnormal. There is mild calcification of the anterior and posterior mitral valve leaflet(s). Mild to moderate mitral valve regurgitation. Tricuspid Valve: The tricuspid valve is grossly normal. Tricuspid valve regurgitation is moderate to severe. Aortic Valve: The aortic valve is tricuspid. Aortic valve regurgitation is trivial. Aortic valve mean gradient measures 3.0 mmHg. Aortic valve peak gradient measures 5.0 mmHg. Aortic valve area, by VTI measures 2.63 cm. Pulmonic Valve: The pulmonic valve was normal in structure. Pulmonic valve regurgitation is not visualized. Aorta: The aortic root and ascending aorta are structurally normal, with no evidence of dilitation. Venous: The inferior vena cava is dilated in size with less than 50% respiratory variability, suggesting right atrial pressure of 15 mmHg. IAS/Shunts: No atrial level shunt detected by color flow Doppler.  LEFT VENTRICLE PLAX 2D LVIDd:         4.00 cm   Diastology LVIDs:         2.30 cm   LV e' medial:    8.27 cm/s LV PW:         0.90 cm  LV E/e' medial:  16.0 LV IVS:        0.80 cm   LV e' lateral:   8.38 cm/s LVOT diam:     1.90 cm   LV E/e' lateral: 15.8 LV SV:         76 LV SV Index:   35 LVOT Area:     2.84 cm  RIGHT VENTRICLE RV  Basal diam:  4.60 cm RV Mid diam:    3.80 cm LEFT ATRIUM            Index        RIGHT ATRIUM           Index LA Vol (A4C): 120.0 ml 55.79 ml/m  RA Area:     36.30 cm                                     RA Volume:   137.50 ml 63.92 ml/m  AORTIC VALVE AV Area (Vmax):    2.49 cm AV Area (Vmean):   2.40 cm AV Area (VTI):     2.63 cm AV Vmax:           112.00 cm/s AV Vmean:          85.400 cm/s AV VTI:            0.290 m AV Peak Grad:      5.0 mmHg AV Mean Grad:      3.0 mmHg LVOT Vmax:         98.50 cm/s LVOT Vmean:        72.200 cm/s LVOT VTI:          0.269 m LVOT/AV VTI ratio: 0.93  AORTA Ao Root diam: 3.00 cm MITRAL VALVE                TRICUSPID VALVE MV Area (PHT): 5.84 cm     TR Peak grad:   36.7 mmHg MV Decel Time: 130 msec     TR Vmax:        303.00 cm/s MV E velocity: 132.00 cm/s MV A velocity: 61.90 cm/s   SHUNTS MV E/A ratio:  2.13         Systemic VTI:  0.27 m                             Systemic Diam: 1.90 cm Zoila Shutter MD Electronically signed by Zoila Shutter MD Signature Date/Time: 04/10/2023/11:29:01 AM    Final    CT Head Wo Contrast Result Date: 04/09/2023 CLINICAL DATA:  Headache, increasing frequency or severity. EXAM: CT HEAD WITHOUT CONTRAST TECHNIQUE: Contiguous axial images were obtained from the base of the skull through the vertex without intravenous contrast. RADIATION DOSE REDUCTION: This exam was performed according to the departmental dose-optimization program which includes automated exposure control, adjustment of the mA and/or kV according to patient size and/or use of iterative reconstruction technique. COMPARISON:  CT scan head from 02/01/2021. FINDINGS: Brain: No evidence of acute infarction, hemorrhage, hydrocephalus, extra-axial collection or mass lesion/mass effect. There is bilateral periventricular hypodensity, which is non-specific but most likely seen in the settings of microvascular ischemic changes. Mild in extent. otherwise normal appearance of brain parenchyma.  Ventricles are normal. Cerebral volume is age appropriate. Vascular: No hyperdense vessel or unexpected calcification. Intracranial arteriosclerosis. Skull: Normal. Negative for fracture or focal lesion. Redemonstration of  postsurgical changes from prior left occipital parietal craniotomy. Sinuses/Orbits: No acute finding. Other: Visualized mastoid air cells are unremarkable. No mastoid effusion. IMPRESSION: *No acute intracranial abnormality. Electronically Signed   By: Jules Schick M.D.   On: 04/09/2023 16:08   DG Chest 2 View Result Date: 04/09/2023 CLINICAL DATA:  Shortness of breath. EXAM: CHEST - 2 VIEW COMPARISON:  07/20/2021. FINDINGS: There are atelectatic changes at the lung bases, right more than left. There is subtle blunting of right costophrenic angle, suggesting trace right pleural effusion. Bilateral lung fields are otherwise clear. Left costophrenic angle is clear. Stable cardio-mediastinal silhouette. No acute osseous abnormalities. Multiple old healed right posterolateral rib fractures noted. The soft tissues are within normal limits. IMPRESSION: Atelectatic changes at the lung bases, right more than left. Trace right pleural effusion. Electronically Signed   By: Jules Schick M.D.   On: 04/09/2023 16:05     Assessment and Plan:   Acute on chronic diastolic heart failure - grade III DD Severe TR Mild to moderate MR Pulmonary hypertension - PASP 51.7 mmHg - echo this admission continues to show LVEF 55-60%, elevated LVEP, grade III DD, mildly enlarged RV with moderately elevated PASP, severe BAE, mild to moderate MR, moderate to severe TR - diuresing on 40 mg IV lasix BID - he does not appear massively volume up, but elevated LVEDP and PASP on echo yesterday - given COPD and pulmonary hypertension, may benefit from RHC to guide diuresis - weight is down, but patient reports largely improving after breathing treatments - of  note, he reported to another provider that he feels much  improved after increased lasix - consider starting 12.5 mg spironolactone and SGLT2i    SOB Likely multifactorial with underlying COPD, HFpEF and RV dysfunction - does not take O2 at home   Hypertension - PTA on 25 mg toprol, 5 mg amlodipine, 25 mg losartan   Persistent atrial fibrillation Chronic anticoagulation - now maintained on 25 mg toprol and 200 mg amiodarone (amiodarone started 07/2020 after failed DCCV) - OAC with eliquis - LFTs mildly elevated - although preferred, may need to consider stopping toprol given COPD and bradycardia - he reports being largely unaware of Afib, unclear if he has had a recurrence - severe BAE   COPD exacerbation - wheezing on arrival - solumedrol and duonebs per primary - has improved his breathing - could consider discontinuing toprol, was held for bradycardia this admission - needs to follow up with pulmonology outpatient, not on home O2 and no home inhaler regimen   Coronary artery calcifications - reassuring NST 07/2020 - on statin therapy - has stopped smoking - no chest pain   BPH - continue terazosin   Former smoker Prior alcohol abuse - he stopped drinking and smoking in 2022 - congratulated him on continued abstinence    Disposition Reports persistent headache and reduced balance since craniotomy. May benefit from [facility] PT. He agrees to follow up with neurology outpatient.   Risk Assessment/Risk Scores:      New York Heart Association (NYHA) Functional Class NYHA Class IV  CHA2DS2-VASc Score = 4   This indicates a 4.8% annual risk of stroke. The patient's score is based upon: CHF History: 1 HTN History: 1 Diabetes History: 0 Stroke History: 0 Vascular Disease History: 1 Age Score: 1 Gender Score: 0      For questions or updates, please contact [facility] Please consult [email or web address] for contact info under    Signed, Marcelino Duster, PA  04/11/2023 9:55 AM

## 2023-04-12 ENCOUNTER — Inpatient Hospital Stay (HOSPITAL_COMMUNITY)

## 2023-04-12 DIAGNOSIS — I480 Paroxysmal atrial fibrillation: Secondary | ICD-10-CM | POA: Diagnosis not present

## 2023-04-12 DIAGNOSIS — I10 Essential (primary) hypertension: Secondary | ICD-10-CM | POA: Diagnosis not present

## 2023-04-12 DIAGNOSIS — J441 Chronic obstructive pulmonary disease with (acute) exacerbation: Secondary | ICD-10-CM | POA: Diagnosis not present

## 2023-04-12 DIAGNOSIS — I5033 Acute on chronic diastolic (congestive) heart failure: Secondary | ICD-10-CM | POA: Diagnosis not present

## 2023-04-12 DIAGNOSIS — E871 Hypo-osmolality and hyponatremia: Secondary | ICD-10-CM | POA: Diagnosis not present

## 2023-04-12 DIAGNOSIS — N400 Enlarged prostate without lower urinary tract symptoms: Secondary | ICD-10-CM | POA: Diagnosis not present

## 2023-04-12 LAB — CBC WITH DIFFERENTIAL/PLATELET
Abs Immature Granulocytes: 0.04 10*3/uL (ref 0.00–0.07)
Basophils Absolute: 0 10*3/uL (ref 0.0–0.1)
Basophils Relative: 0 %
Eosinophils Absolute: 0 10*3/uL (ref 0.0–0.5)
Eosinophils Relative: 0 %
HCT: 37.5 % — ABNORMAL LOW (ref 39.0–52.0)
Hemoglobin: 12.1 g/dL — ABNORMAL LOW (ref 13.0–17.0)
Immature Granulocytes: 1 %
Lymphocytes Relative: 4 %
Lymphs Abs: 0.3 10*3/uL — ABNORMAL LOW (ref 0.7–4.0)
MCH: 28.5 pg (ref 26.0–34.0)
MCHC: 32.3 g/dL (ref 30.0–36.0)
MCV: 88.4 fL (ref 80.0–100.0)
Monocytes Absolute: 0.2 10*3/uL (ref 0.1–1.0)
Monocytes Relative: 3 %
Neutro Abs: 6.8 10*3/uL (ref 1.7–7.7)
Neutrophils Relative %: 92 %
Platelets: 167 10*3/uL (ref 150–400)
RBC: 4.24 MIL/uL (ref 4.22–5.81)
RDW: 14.3 % (ref 11.5–15.5)
WBC: 7.3 10*3/uL (ref 4.0–10.5)
nRBC: 0 % (ref 0.0–0.2)

## 2023-04-12 LAB — COMPREHENSIVE METABOLIC PANEL WITH GFR
ALT: 60 U/L — ABNORMAL HIGH (ref 0–44)
AST: 56 U/L — ABNORMAL HIGH (ref 15–41)
Albumin: 3.4 g/dL — ABNORMAL LOW (ref 3.5–5.0)
Alkaline Phosphatase: 70 U/L (ref 38–126)
Anion gap: 10 (ref 5–15)
BUN: 21 mg/dL (ref 8–23)
CO2: 28 mmol/L (ref 22–32)
Calcium: 8.7 mg/dL — ABNORMAL LOW (ref 8.9–10.3)
Chloride: 95 mmol/L — ABNORMAL LOW (ref 98–111)
Creatinine, Ser: 0.72 mg/dL (ref 0.61–1.24)
GFR, Estimated: >60 mL/min (ref >60)
Glucose, Bld: 180 mg/dL — ABNORMAL HIGH (ref 70–99)
Potassium: 4 mmol/L (ref 3.5–5.1)
Sodium: 133 mmol/L — ABNORMAL LOW (ref 135–145)
Total Bilirubin: 0.8 mg/dL (ref 0.0–1.2)
Total Protein: 5.9 g/dL — ABNORMAL LOW (ref 6.5–8.1)

## 2023-04-12 LAB — MAGNESIUM: Magnesium: 2 mg/dL (ref 1.7–2.4)

## 2023-04-12 LAB — PHOSPHORUS: Phosphorus: 3.7 mg/dL (ref 2.5–4.6)

## 2023-04-12 MED ORDER — EMPAGLIFLOZIN 10 MG PO TABS
10.0000 mg | ORAL_TABLET | Freq: Every day | ORAL | Status: DC
  Administered 2023-04-12 – 2023-04-17 (×6): 10 mg via ORAL
  Filled 2023-04-12 (×6): qty 1

## 2023-04-12 NOTE — Progress Notes (Signed)
 DAILY PROGRESS NOTE   Patient Name: Samuel Barnes Date of Encounter: 04/12/2023 Cardiologist: Armanda Magic, MD  Chief Complaint   Breathing better  Patient Profile   Samuel Barnes is a 73 y.o. 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 Marland Mcalpine.   Subjective   Diuresed about 3.7L Negative overnight. Creatinine stable. BNP elevated at 775. Sodium improved with diuresis, but remains 133.  Objective   Vitals:   04/11/23 0825 04/11/23 1427 04/11/23 1936 04/12/23 0342  BP: (!) 140/69 (!) 139/91 (!) 139/56 138/74  Pulse: (!) 54 (!) 56 69 (!) 53  Resp: 18 18 19 18   Temp: 97.6 F (36.4 C) 99.4 F (37.4 C) 98.9 F (37.2 C) 97.8 F (36.6 C)  TempSrc: Axillary Axillary Oral Oral  SpO2: 94% 94% 91% 93%  Weight:    98.9 kg  Height:        Intake/Output Summary (Last 24 hours) at 04/12/2023 0802 Last data filed at 04/12/2023 0537 Gross per 24 hour  Intake 720 ml  Output 3175 ml  Net -2455 ml   Filed Weights   04/10/23 0615 04/11/23 0502 04/12/23 0342  Weight: 99.7 kg 99 kg 98.9 kg    Physical Exam   General appearance: alert and no distress Neck: JVD - 3 cm above sternal notch, no carotid bruit, and thyroid not enlarged, symmetric, no tenderness/mass/nodules Lungs: diminished breath sounds bibasilar Heart: regular bradycardia Extremities: extremities normal, atraumatic, no cyanosis or edema Neurologic: Grossly normal  Inpatient Medications    Scheduled Meds:  amiodarone  200 mg Oral Daily   amLODipine  5 mg Oral Daily   apixaban  5 mg Oral BID   busPIRone  5 mg Oral BID   citalopram  20 mg Oral Daily   furosemide  40 mg Intravenous BID   losartan  25 mg Oral Daily   methylPREDNISolone (SOLU-MEDROL) injection  60 mg Intravenous Q12H   montelukast  10 mg Oral Daily    pravastatin  20 mg Oral q1800   terazosin  5 mg Oral Daily    Continuous Infusions:  azithromycin 500 mg (04/11/23 2303)    PRN Meds: acetaminophen **OR** acetaminophen, albuterol, butalbital-acetaminophen-caffeine, melatonin, ondansetron (ZOFRAN) IV   Labs   Results for orders placed or performed during the hospital encounter of 04/09/23 (from the past 48 hours)  CBC with Differential/Platelet     Status: Abnormal   Collection Time: 04/11/23  5:30 AM  Result Value Ref Range   WBC 6.6 4.0 - 10.5 K/uL   RBC 4.33 4.22 - 5.81 MIL/uL   Hemoglobin 12.4 (L) 13.0 - 17.0 g/dL   HCT 32.4 (L) 40.1 - 02.7 %   MCV 87.8 80.0 - 100.0 fL   MCH 28.6 26.0 - 34.0 pg   MCHC 32.6 30.0 - 36.0 g/dL   RDW 25.3 66.4 - 40.3 %   Platelets 184 150 - 400 K/uL   nRBC 0.0 0.0 - 0.2 %   Neutrophils Relative % 92 %   Neutro Abs 6.1 1.7 - 7.7 K/uL   Lymphocytes Relative 5 %   Lymphs Abs 0.3 (L) 0.7 - 4.0 K/uL   Monocytes Relative 3 %   Monocytes Absolute 0.2 0.1 - 1.0 K/uL   Eosinophils Relative 0 %   Eosinophils Absolute 0.0 0.0 - 0.5  K/uL   Basophils Relative 0 %   Basophils Absolute 0.0 0.0 - 0.1 K/uL   Immature Granulocytes 0 %   Abs Immature Granulocytes 0.02 0.00 - 0.07 K/uL    Comment: Performed at [facility], [address] [place], [place] [address]  Comprehensive metabolic panel with GFR     Status: Abnormal   Collection Time: 04/11/23  5:30 AM  Result Value Ref Range   Sodium 133 (L) 135 - 145 mmol/L   Potassium 4.3 3.5 - 5.1 mmol/L   Chloride 94 (L) 98 - 111 mmol/L   CO2 28 22 - 32 mmol/L   Glucose, Bld 155 (H) 70 - 99 mg/dL    Comment: Glucose reference range applies only to samples taken after fasting for at least 8 hours.   BUN 22 8 - 23 mg/dL   Creatinine, Ser 1.91 0.61 - 1.24 mg/dL   Calcium 9.0 8.9 - 47.8 mg/dL   Total Protein 6.7 6.5 - 8.1 g/dL   Albumin 3.8 3.5 - 5.0 g/dL   AST 42 (H) 15 - 41 U/L   ALT 55 (H) 0 - 44 U/L   Alkaline Phosphatase 69 38 - 126 U/L    Total Bilirubin 0.8 0.0 - 1.2 mg/dL   GFR, Estimated >29 >56 mL/min    Comment: (NOTE) Calculated using the CKD-EPI Creatinine Equation (2021)    Anion gap 11 5 - 15    Comment: Performed at [facility], [address], [place], [place] [address]  Phosphorus     Status: None   Collection Time: 04/11/23  5:30 AM  Result Value Ref Range   Phosphorus 3.4 2.5 - 4.6 mg/dL    Comment: Performed at [facility], [address] [place], [place] [address]  Magnesium     Status: None   Collection Time: 04/11/23  5:30 AM  Result Value Ref Range   Magnesium 2.2 1.7 - 2.4 mg/dL    Comment: Performed at [facility], [address] [place], [place] [address]  Vitamin B12     Status: Abnormal   Collection Time: 04/11/23  5:30 AM  Result Value Ref Range   Vitamin B-12 955 (H) 180 - 914 pg/mL    Comment: HEMOLYSIS AT THIS LEVEL MAY AFFECT RESULT (NOTE) This assay is not validated for testing neonatal or myeloproliferative syndrome specimens for Vitamin B12 levels. Performed at [facility], [address] [place], [place] [address]   Folate     Status: None   Collection Time: 04/11/23  5:30 AM  Result Value Ref Range   Folate 28.6 >5.9 ng/mL    Comment: Performed at [facility], [address], [place], [place] [address]  Iron and TIBC     Status: Abnormal   Collection Time: 04/11/23  5:30 AM  Result Value Ref Range   Iron 35 (L) 45 - 182 ug/dL   TIBC 102 725 - 366 ug/dL   Saturation Ratios 9 (L) 17.9 - 39.5 %   UIBC 351 ug/dL    Comment: Performed at [facility], [address], [place], [place] [address]  Ferritin     Status: None   Collection Time: 04/11/23  5:30 AM  Result Value Ref Range   Ferritin 38 24 - 336 ng/mL    Comment: Performed at [facility], [address], [place], [place] [address]  Reticulocytes     Status: None   Collection  Time: 04/11/23  5:30 AM  Result Value Ref Range   Retic Ct Pct 1.4 0.4 - 3.1 %   RBC. 4.35  4.22 - 5.81 MIL/uL   Retic Count, Absolute 62.6 19.0 - 186.0 K/uL   Immature Retic Fract 10.0 2.3 - 15.9 %    Comment: Performed at [facility], [address] [place], [place] [address]  CBC with Differential/Platelet     Status: Abnormal   Collection Time: 04/12/23  4:37 AM  Result Value Ref Range   WBC 7.3 4.0 - 10.5 K/uL   RBC 4.24 4.22 - 5.81 MIL/uL   Hemoglobin 12.1 (L) 13.0 - 17.0 g/dL   HCT 72.5 (L) 36.6 - 44.0 %   MCV 88.4 80.0 - 100.0 fL   MCH 28.5 26.0 - 34.0 pg   MCHC 32.3 30.0 - 36.0 g/dL   RDW 34.7 42.5 - 95.6 %   Platelets 167 150 - 400 K/uL   nRBC 0.0 0.0 - 0.2 %   Neutrophils Relative % 92 %   Neutro Abs 6.8 1.7 - 7.7 K/uL   Lymphocytes Relative 4 %   Lymphs Abs 0.3 (L) 0.7 - 4.0 K/uL   Monocytes Relative 3 %   Monocytes Absolute 0.2 0.1 - 1.0 K/uL   Eosinophils Relative 0 %   Eosinophils Absolute 0.0 0.0 - 0.5 K/uL   Basophils Relative 0 %   Basophils Absolute 0.0 0.0 - 0.1 K/uL   Immature Granulocytes 1 %   Abs Immature Granulocytes 0.04 0.00 - 0.07 K/uL    Comment: Performed at [facility], [address] [place], [place] [address]  Comprehensive metabolic panel with GFR     Status: Abnormal   Collection Time: 04/12/23  4:37 AM  Result Value Ref Range   Sodium 133 (L) 135 - 145 mmol/L   Potassium 4.0 3.5 - 5.1 mmol/L   Chloride 95 (L) 98 - 111 mmol/L   CO2 28 22 - 32 mmol/L   Glucose, Bld 180 (H) 70 - 99 mg/dL    Comment: Glucose reference range applies only to samples taken after fasting for at least 8 hours.   BUN 21 8 - 23 mg/dL   Creatinine, Ser 4.33 0.61 - 1.24 mg/dL   Calcium 8.7 (L) 8.9 - 10.3 mg/dL   Total Protein 5.9 (L) 6.5 - 8.1 g/dL   Albumin 3.4 (L) 3.5 - 5.0 g/dL   AST 56 (H) 15 - 41 U/L   ALT 60 (H) 0 - 44 U/L   Alkaline Phosphatase 70 38 - 126 U/L   Total Bilirubin 0.8 0.0 - 1.2 mg/dL   GFR, Estimated >29 >51 mL/min    Comment: (NOTE) Calculated using the CKD-EPI Creatinine Equation (2021)    Anion gap 10 5 - 15     Comment: Performed at [facility], [address] [place], [place] [address]  Magnesium     Status: None   Collection Time: 04/12/23  4:37 AM  Result Value Ref Range   Magnesium 2.0 1.7 - 2.4 mg/dL    Comment: Performed at [facility], [address], [place], [place] [address]  Phosphorus     Status: None   Collection Time: 04/12/23  4:37 AM  Result Value Ref Range   Phosphorus 3.7 2.5 - 4.6 mg/dL    Comment: Performed at [facility], [address], [place], [place] [address]    ECG   N/A  Telemetry   Sinus  bradycardia - Personally Reviewed  Radiology    DG CHEST PORT 1 VIEW Result Date: 04/11/2023 CLINICAL DATA:  Shortness of breath. EXAM: PORTABLE CHEST 1 VIEW COMPARISON:  April 09, 2023. FINDINGS: Stable cardiomegaly. Mild right basilar atelectasis is noted with small right pleural effusion. Old right rib fractures are noted. Left lung is clear. IMPRESSION: Mild right basilar atelectasis is noted with small right pleural effusion. Electronically Signed   By: Lupita Raider M.D.   On: 04/11/2023 08:17   ECHOCARDIOGRAM COMPLETE Result Date: 04/10/2023    ECHOCARDIOGRAM REPORT   Patient Name:   BETZALEL UMBARGER Date of Exam: 04/10/2023 Medical Rec #:  [id number]             Height:       69.0 in Accession #:    [id number]            Weight:       219.9 lb Date of Birth:  1950-01-25             BSA:          2.151 m Patient Age:    72 years              BP:           157/75 mmHg Patient Gender: M                     HR:           54 bpm. Exam Location:  Inpatient Procedure: 2D Echo, Cardiac Doppler and Color Doppler (Both Spectral and Color            Flow Doppler were utilized during procedure). Indications:    CHF  History:        Patient has prior history of Echocardiogram examinations, most                 recent 07/29/2020. CHF, COPD, Arrythmias:Atrial Fibrillation;                 Risk Factors:Hypertension.  Sonographer:    Darlys Gales Referring Phys:  [id number] JUSTIN B HOWERTER IMPRESSIONS  1. Left ventricular ejection fraction, by estimation, is 55 to 60%. The left ventricle has normal function. The left ventricle has no regional wall motion abnormalities. Left ventricular diastolic parameters are consistent with Grade III diastolic dysfunction (restrictive). Elevated left ventricular end-diastolic pressure.  2. Right ventricular systolic function is normal. The right ventricular size is mildly enlarged. There is moderately elevated pulmonary artery systolic pressure. The estimated right ventricular systolic pressure is 51.7 mmHg.  3. Left atrial size was severely dilated.  4. Right atrial size was severely dilated.  5. The mitral valve is abnormal. Mild to moderate mitral valve regurgitation.  6. Tricuspid valve regurgitation is moderate to severe.  7. The aortic valve is tricuspid. Aortic valve regurgitation is trivial.  8. The inferior vena cava is dilated in size with <50% respiratory variability, suggesting right atrial pressure of 15 mmHg. Comparison(s): Changes from prior study are noted. 07/29/2020: LVEF 55-60%. FINDINGS  Left Ventricle: Left ventricular ejection fraction, by estimation, is 55 to 60%. The left ventricle has normal function. The left ventricle has no regional wall motion abnormalities. The left ventricular internal cavity size was normal in size. There is  no left ventricular hypertrophy. Left ventricular diastolic parameters are consistent with Grade III diastolic dysfunction (restrictive). Elevated left ventricular end-diastolic pressure. Right Ventricle: The right ventricular size is mildly enlarged. No increase  in right ventricular wall thickness. Right ventricular systolic function is normal. There is moderately elevated pulmonary artery systolic pressure. The tricuspid regurgitant velocity is 3.03 m/s, and with an assumed right atrial pressure of 15 mmHg, the estimated right ventricular systolic pressure is 51.7 mmHg. Left Atrium:  Left atrial size was severely dilated. Right Atrium: Right atrial size was severely dilated. Pericardium: There is no evidence of pericardial effusion. Mitral Valve: The mitral valve is abnormal. There is mild calcification of the anterior and posterior mitral valve leaflet(s). Mild to moderate mitral valve regurgitation. Tricuspid Valve: The tricuspid valve is grossly normal. Tricuspid valve regurgitation is moderate to severe. Aortic Valve: The aortic valve is tricuspid. Aortic valve regurgitation is trivial. Aortic valve mean gradient measures 3.0 mmHg. Aortic valve peak gradient measures 5.0 mmHg. Aortic valve area, by VTI measures 2.63 cm. Pulmonic Valve: The pulmonic valve was normal in structure. Pulmonic valve regurgitation is not visualized. Aorta: The aortic root and ascending aorta are structurally normal, with no evidence of dilitation. Venous: The inferior vena cava is dilated in size with less than 50% respiratory variability, suggesting right atrial pressure of 15 mmHg. IAS/Shunts: No atrial level shunt detected by color flow Doppler.  LEFT VENTRICLE PLAX 2D LVIDd:         4.00 cm   Diastology LVIDs:         2.30 cm   LV e' medial:    8.27 cm/s LV PW:         0.90 cm   LV E/e' medial:  16.0 LV IVS:        0.80 cm   LV e' lateral:   8.38 cm/s LVOT diam:     1.90 cm   LV E/e' lateral: 15.8 LV SV:         76 LV SV Index:   35 LVOT Area:     2.84 cm  RIGHT VENTRICLE RV Basal diam:  4.60 cm RV Mid diam:    3.80 cm LEFT ATRIUM            Index        RIGHT ATRIUM           Index LA Vol (A4C): 120.0 ml 55.79 ml/m  RA Area:     36.30 cm                                     RA Volume:   137.50 ml 63.92 ml/m  AORTIC VALVE AV Area (Vmax):    2.49 cm AV Area (Vmean):   2.40 cm AV Area (VTI):     2.63 cm AV Vmax:           112.00 cm/s AV Vmean:          85.400 cm/s AV VTI:            0.290 m AV Peak Grad:      5.0 mmHg AV Mean Grad:      3.0 mmHg LVOT Vmax:         98.50 cm/s LVOT Vmean:        72.200 cm/s  LVOT VTI:          0.269 m LVOT/AV VTI ratio: 0.93  AORTA Ao Root diam: 3.00 cm MITRAL VALVE                TRICUSPID VALVE MV Area (PHT): 5.84 cm  TR Peak grad:   36.7 mmHg MV Decel Time: 130 msec     TR Vmax:        303.00 cm/s MV E velocity: 132.00 cm/s MV A velocity: 61.90 cm/s   SHUNTS MV E/A ratio:  2.13         Systemic VTI:  0.27 m                             Systemic Diam: 1.90 cm Zoila Shutter MD Electronically signed by Zoila Shutter MD Signature Date/Time: 04/10/2023/11:29:01 AM    Final     Cardiac Studies   See echo above  Assessment   Principal Problem:   Acute on chronic diastolic CHF (congestive heart failure) (HCC) Active Problems:   HLD (hyperlipidemia)   BPH (benign prostatic hyperplasia)   Essential hypertension   Chronic hyponatremia   Paroxysmal atrial fibrillation (HCC)   SOB (shortness of breath)   COPD with acute exacerbation (HCC)   GAD (generalized anxiety disorder)   Plan   Good diuresis overnight- creatinine stable. Breathing has improved. Would continue IV diuresis. Plan for possible RHC next week given significant diastolic dysfunction and severe biatrial enlargement- ?amyloid. Will add jardiance 10 mg daily for heart failure.  Time Spent Directly with Patient:  I have spent a total of 25 minutes with the patient reviewing hospital notes, telemetry, EKGs, labs and examining the patient as well as establishing an assessment and plan that was discussed personally with the patient.  > 50% of time was spent in direct patient care.  Length of Stay:  LOS: 3 days   Chrystie Nose, MD, [facility], FACP  [place]  [facility]  Medical Director of the [facility]  [facility] Diplomate of the American Board of Clinical Lipidology Attending Cardiologist  Direct Dial: [phone]  Fax: [phone]  Website:  [email or web address]  Lisette Abu Ahliyah Nienow 04/12/2023, 8:02 AM

## 2023-04-12 NOTE — Progress Notes (Signed)
 PROGRESS NOTE    Samuel Barnes [facility]  WUJ:[id number] DOB: [birth year] DOA: 04/09/2023 PCP: [name], PA   Brief Narrative:  The patient is a 73 year old Caucasian male with a past medical history significant for but not limited to paroxysmal atrial fibrillation on anticoagulation, chronic diastolic CHF, history of intracranial hemorrhage and September 2022, BPH, chronic hyponatremia, essential hypertension, hyperlipidemia, who presented from [facility] for acute shortness of breath that has been worsening over the last several weeks.  Being admitted and treated for acute respiratory failure with hypoxia in the setting of an acute exacerbation of COPD and acute exacerbation on chronic diastolic CHF. Cardiology evaluating and are considering a right heart catheterization on Monday.  Assessment and Plan:  Acute Exacerbation of Diastolic CHF / Severe TR / Mild to Moderate MR / Pulmonary HTN: BNP elevated at 775.4 on Admission. ECHO this visit shows EF of 55-60% Grade 3 DD. Increased Diuresis from IV Lasix 20 mg BID to IV 40 mg BID this is being continued.. Strict Is and Os and Daily Weights. Repeat CXR as below. Cardiology consulted for further evaluation and recommendations and they are planning for a right heart cath next week given his significant diastolic dysfunction and severe bilateral atrial enlargement with concern for possible amyloid. Holding his beta-blocker with Metoprolol  Succinate 25 mg p.o. daily given his bradycardia; Cardiology recommends considering starting spironolactone 12.5 mg p.o. daily but have initiated SGLT2 inhibitor with empagliflozin 10 mg p.o. daily today.  Acute Exacerbation of COPD: C/w Steroids with Solumedrol and start weaning from IV Methylprednisolone 80 mg BID to 60 mg BID. Initiated on IV Azithromycin. Flutter Valve and Incentive Spirometry. DuoNeb q6h now stopped. C/w Montelukast 10 mg po Daily C/w Albuterol Neb 2.5 mg q4hprn. CXR done and showed  Bibasilar ATX as well as a Small R sided Pleural Effusion; repeat CXR done and showed "Mild right basilar atelectasis is noted with small right pleural effusion."  Acute Respiratory Failure with Hypoxia: In the setting of Above. C/w Diuresis and Treatment. Wean O2 as tolerated as he does not wear oxygen at home. SpO2: 94 % O2 Flow Rate (L/min): 2 L/min; Will need an Ambulatory Home O2 Screen prior to D/C; was able to be weaned off of O2  PAF: Has a CHA2DS2-VASc of 3. C/w Metoprolol Succinate 12.5 mg po Daily and Amiodarone 200 mg po Daily. C/w Anticoagulation with Apixaban 5 mg po BID. ECHOCardiogram as above; Cardiology is following and appreciate further management  Essential HTN: C/w Amlodipine 5 mg po Daily; Holding Metoprolol Succinate 12.5 mg po Daily given Bradycardia. C/w Terazosin 5 mg po daily. CTM BP per Protocol. Last BP Reading was 163/72  HLD: C/w Lovastatin substitution for Pravastatin 20 mg po Daily  HypoNa+: Likely 2/2 to Hypervolemia but has chronic hypoosmolar hyponatremia. B/L ranging from 130-134. C/w Diuresis. Na+ went from 132 -> 130 -> 133 again. CTM and Trend and repeat CMP in the AM   BPH: C/w Terazosin 5 mg po Daily   Normocytic Anemia: Hgb/Hct went from 12.7/39.8 -> 12.4/37.6 -> 12.4/38.0 -> 12.1/37.5. Anemia Panel showed an iron level of 35, UIBC 351, TIBC 386, saturation ration 9%, ferritin level 38, folate 20.6 and a vitamin B12 level of 955 CTM for S/Sx of Bleeding; No overt bleeding noted. Repeat CBC in the AM   Generalized Anxiety Disorder: C/w Buspirone 5 mg po BID and Citalopram 20 mg po Daily   Abnormal LFTs: Mild and likely in the setting of Volume Overload.  AST is now 45 -> 42 -> 56 and ALT is now 53 -> 55 -> 60. CTM and Trend and if not improving will obtain a right upper quadrant ultrasound as well as an acute hepatitis panel. Repeat CMP in AM.  Hypoalbuminemia: Patient's Albumin level went from 3.7 -> 3.8 -> 3.4. CTM and Trend and repeat CMP in the AM    Class I Obesity: Complicates overall prognosis and care. Estimated body mass index is 32.19 kg/m as calculated from the following:   Height as of this encounter: 5\' 9"  (1.753 m).   Weight as of this encounter: 98.9 kg. Weight Loss and Dietary Counseling given   DVT prophylaxis:  apixaban (ELIQUIS) tablet 5 mg    Code Status: Full Code Family Communication: No family present at bedside   Disposition Plan:  Level of care: Telemetry Cardiac Status is: Inpatient Remains inpatient appropriate because: Needs further Cardiac Clearance and work up   Consultants:  Cardiology  Procedures:  As delineated as above; ECHOCardiogram   Antimicrobials:  Anti-infectives (From admission, onward)    Start     Dose/Rate Route Frequency Ordered Stop   04/09/23 2200  azithromycin (ZITHROMAX) 500 mg in sodium chloride 0.9 % 250 mL IVPB       Note to Pharmacy: (In setting of acute copd exac)   500 mg 250 mL/hr over 60 Minutes Intravenous Every 24 hours 04/09/23 2053 04/14/23 2159       Subjective: Seen and examined at bedside thinks his breathing is doing okay when he is stable but states whenever he tries to exert himself he becomes extremely short of breath and dyspneic.  No nausea or vomiting.  Continues to complain of intermittent headache.  No other concerns or complaints at this time.  Objective: Vitals:   04/11/23 1936 04/12/23 0342 04/12/23 0808 04/12/23 1051  BP: (!) 139/56 138/74  (!) 163/72  Pulse: 69 (!) 53 (!) 51 (!) 49  Resp: 19 18  16   Temp: 98.9 F (37.2 C) 97.8 F (36.6 C)  97.6 F (36.4 C)  TempSrc: Oral Oral  Oral  SpO2: 91% 93% 94%   Weight:  98.9 kg    Height:        Intake/Output Summary (Last 24 hours) at 04/12/2023 1559 Last data filed at 04/12/2023 1352 Gross per 24 hour  Intake 240 ml  Output 4275 ml  Net -4035 ml   Filed Weights   04/10/23 0615 04/11/23 0502 04/12/23 0342  Weight: 99.7 kg 99 kg 98.9 kg   Examination: Physical Exam:  Constitutional:  WN/WD obese Caucasian male in NAD Respiratory: Diminished to auscultation bilaterally with some coarse breath sounds, no wheezing, rales, rhonchi or crackles. Normal respiratory effort and patient is not tachypenic. No accessory muscle use. Unlabored breathing and not wearing supplemental oxygen via nasal cannula today. Cardiovascular: RRR, no murmurs / rubs / gallops. S1 and S2 auscultated. No appreciable LE edema  Abdomen: Soft, non-tender, distended 2/2 body habitus. Bowel sounds positive.  GU: Deferred. Musculoskeletal: No clubbing / cyanosis of digits/nails. No joint deformity upper and lower extremities.  Skin: No rashes, lesions, ulcers on a limited skin evaluation. No induration; Warm and dry.  Neurologic: CN 2-12 grossly intact with no focal deficits. Romberg sign and cerebellar reflexes not assessed.  Psychiatric: Normal judgment and insight. Alert and oriented x 3. Normal mood and appropriate affect.   Data Reviewed: I have personally reviewed following labs and imaging studies  CBC: Recent Labs  Lab 04/09/23 1413 04/10/23 0531  04/11/23 0530 04/12/23 0437  WBC 4.2 4.3 6.6 7.3  NEUTROABS  --  3.7 6.1 6.8  HGB 12.7* 12.4* 12.4* 12.1*  HCT 39.8 37.6* 38.0* 37.5*  MCV 89.4 87.2 87.8 88.4  PLT 165 158 184 167   Basic Metabolic Panel: Recent Labs  Lab 04/09/23 1413 04/10/23 0531 04/11/23 0530 04/12/23 0437  NA 132* 130* 133* 133*  K 3.9 4.2 4.3 4.0  CL 95* 94* 94* 95*  CO2 28 26 28 28   GLUCOSE 135* 132* 155* 180*  BUN 15 16 22 21   CREATININE 0.60* 0.62 0.73 0.72  CALCIUM 8.7* 8.7* 9.0 8.7*  MG  --  2.1 2.2 2.0  PHOS  --  2.9 3.4 3.7   GFR: Estimated Creatinine Clearance: 95.4 mL/min (by C-G formula based on SCr of 0.72 mg/dL). Liver Function Tests: Recent Labs  Lab 04/10/23 0531 04/11/23 0530 04/12/23 0437  AST 45* 42* 56*  ALT 53* 55* 60*  ALKPHOS 74 69 70  BILITOT 1.0 0.8 0.8  PROT 6.5 6.7 5.9*  ALBUMIN 3.7 3.8 3.4*   No results for input(s):  "LIPASE", "AMYLASE" in the last 168 hours. No results for input(s): "AMMONIA" in the last 168 hours. Coagulation Profile: No results for input(s): "INR", "PROTIME" in the last 168 hours. Cardiac Enzymes: No results for input(s): "CKTOTAL", "CKMB", "CKMBINDEX", "TROPONINI" in the last 168 hours. BNP (last 3 results) No results for input(s): "PROBNP" in the last 8760 hours. HbA1C: No results for input(s): "HGBA1C" in the last 72 hours. CBG: Recent Labs  Lab 04/09/23 1414  GLUCAP 148*   Lipid Profile: No results for input(s): "CHOL", "HDL", "LDLCALC", "TRIG", "CHOLHDL", "LDLDIRECT" in the last 72 hours. Thyroid Function Tests: Recent Labs    04/09/23 2109  TSH 0.953   Anemia Panel: Recent Labs    04/11/23 0530  VITAMINB12 955*  FOLATE 28.6  FERRITIN 38  TIBC 386  IRON 35*  RETICCTPCT 1.4   Sepsis Labs: Recent Labs  Lab 04/10/23 0531  PROCALCITON <0.10   Recent Results (from the past 240 hours)  Resp panel by RT-PCR (RSV, Flu A&B, Covid) Anterior Nasal Swab     Status: None   Collection Time: 04/09/23  2:13 PM   Specimen: Anterior Nasal Swab  Result Value Ref Range Status   SARS Coronavirus 2 by RT PCR NEGATIVE NEGATIVE Final    Comment: (NOTE) SARS-CoV-2 target nucleic acids are NOT DETECTED.  The SARS-CoV-2 RNA is generally detectable in upper respiratory specimens during the acute phase of infection. The lowest concentration of SARS-CoV-2 viral copies this assay can detect is 138 copies/mL. A negative result does not preclude SARS-Cov-2 infection and should not be used as the sole basis for treatment or other patient management decisions. A negative result may occur with  improper specimen collection/handling, submission of specimen other than nasopharyngeal swab, presence of viral mutation(s) within the areas targeted by this assay, and inadequate number of viral copies(<138 copies/mL). A negative result must be combined with clinical observations, patient  history, and epidemiological information. The expected result is Negative.  Fact Sheet for Patients:  BloggerCourse.com  Fact Sheet for Healthcare Providers:  SeriousBroker.it  This test is no t yet approved or cleared by the [place] FDA and  has been authorized for detection and/or diagnosis of SARS-CoV-2 by FDA under an Emergency Use Authorization (EUA). This EUA will remain  in effect (meaning this test can be used) for the duration of the COVID-19 declaration under Section 564(b)(1) of the Act, 21  U.S.C.section 360bbb-3(b)(1), unless the authorization is terminated  or revoked sooner.       Influenza A by PCR NEGATIVE NEGATIVE Final   Influenza B by PCR NEGATIVE NEGATIVE Final    Comment: (NOTE) The Xpert Xpress SARS-CoV-2/FLU/RSV plus assay is intended as an aid in the diagnosis of influenza from Nasopharyngeal swab specimens and should not be used as a sole basis for treatment. Nasal washings and aspirates are unacceptable for Xpert Xpress SARS-CoV-2/FLU/RSV testing.  Fact Sheet for Patients: BloggerCourse.com  Fact Sheet for Healthcare Providers: SeriousBroker.it  This test is not yet approved or cleared by the [place] FDA and has been authorized for detection and/or diagnosis of SARS-CoV-2 by FDA under an Emergency Use Authorization (EUA). This EUA will remain in effect (meaning this test can be used) for the duration of the COVID-19 declaration under Section 564(b)(1) of the Act, 21 U.S.C. section 360bbb-3(b)(1), unless the authorization is terminated or revoked.     Resp Syncytial Virus by PCR NEGATIVE NEGATIVE Final    Comment: (NOTE) Fact Sheet for Patients: BloggerCourse.com  Fact Sheet for Healthcare Providers: SeriousBroker.it  This test is not yet approved or cleared by the [place] FDA  and has been authorized for detection and/or diagnosis of SARS-CoV-2 by FDA under an Emergency Use Authorization (EUA). This EUA will remain in effect (meaning this test can be used) for the duration of the COVID-19 declaration under Section 564(b)(1) of the Act, 21 U.S.C. section 360bbb-3(b)(1), unless the authorization is terminated or revoked.  Performed at [facility], [address] [place], [place] [address]     Radiology Studies: DG CHEST PORT 1 VIEW Result Date: 04/12/2023 CLINICAL DATA:  Shortness of breath EXAM: PORTABLE CHEST 1 VIEW COMPARISON:  04/11/2023 FINDINGS: Cardiomegaly. Mediastinal contour stable. Right lower airspace opacity is increasing since prior study, likely a combination of pleural effusion and airspace disease. Left lung clear. No edema. No acute bony abnormality. IMPRESSION: Worsening right pleural effusion and right lower lobe atelectasis or infiltrate. Electronically Signed   By: Charlett Nose M.D.   On: 04/12/2023 10:12   DG CHEST PORT 1 VIEW Result Date: 04/11/2023 CLINICAL DATA:  Shortness of breath. EXAM: PORTABLE CHEST 1 VIEW COMPARISON:  April 09, 2023. FINDINGS: Stable cardiomegaly. Mild right basilar atelectasis is noted with small right pleural effusion. Old right rib fractures are noted. Left lung is clear. IMPRESSION: Mild right basilar atelectasis is noted with small right pleural effusion. Electronically Signed   By: Lupita Raider M.D.   On: 04/11/2023 08:17   Scheduled Meds:  amiodarone  200 mg Oral Daily   amLODipine  5 mg Oral Daily   apixaban  5 mg Oral BID   busPIRone  5 mg Oral BID   citalopram  20 mg Oral Daily   empagliflozin  10 mg Oral Daily   furosemide  40 mg Intravenous BID   losartan  25 mg Oral Daily   methylPREDNISolone (SOLU-MEDROL) injection  60 mg Intravenous Q12H   montelukast  10 mg Oral Daily   pravastatin  20 mg Oral q1800   terazosin  5 mg Oral Daily   Continuous Infusions:  azithromycin 500 mg  (04/11/23 2303)    LOS: 3 days   Marguerita Merles, DO [facility] Available via Epic secure chat 7am-7pm After these hours, please refer to coverage provider listed on [email or web address] 04/12/2023, 3:59 PM

## 2023-04-12 NOTE — Evaluation (Signed)
 Occupational Therapy Evaluation and DC Summary   Patient Details Name: Samuel Barnes MRN: [id number] DOB: 1950/04/22 Today's Date: 04/12/2023   History of Present Illness   73 y/o male admitted to [facility] on 04/09/23 with acute respiratory failure with hypoxia and acute exacerbation of CHF and COPD.Marland Kitchen PMH: aortic atherosclerosis, HTN, COPD, A-fib on Eliquis, diastolic CHF, stroke, craniotomy (2022)     Clinical Impressions Pt admitted for above, PTA pt reports being ind in ADLs and ambulating no AD in home and with [facility] limited distances in the community. Pt presenting close to functional baseline and ind with ADLs, ambulating mod I using RW in room. Pt Sp02 stable throughout activity and his cognition is [facility]. Pt has no further acute skilled OT needs, no post acute OT recommended.      If plan is discharge home, recommend the following:   Other (comment) (prn)     Functional Status Assessment   Patient has not had a recent decline in their functional status     Equipment Recommendations   None recommended by OT     Recommendations for Other Services         Precautions/Restrictions   Precautions Precautions: Fall (low fall risk) Restrictions Weight Bearing Restrictions Per Provider Order: No     Mobility Bed Mobility Overal bed mobility: Independent                  Transfers Overall transfer level: Modified independent Equipment used: Rolling walker (2 wheels)               General transfer comment: no cues needed for hand placement.      Balance Overall balance assessment: Mild deficits observed, not formally tested                                         ADL either performed or assessed with clinical judgement   ADL Overall ADL's : Independent                                       General ADL Comments: Pt demonstrated ability to complete standing/seated ADLSs independently.     Vision          Perception         Praxis         Pertinent Vitals/Pain Pain Assessment Pain Assessment: No/denies pain (recent pain meds for headache relief)     Extremity/Trunk Assessment Upper Extremity Assessment Upper Extremity Assessment: Overall WFL for tasks assessed   Lower Extremity Assessment Lower Extremity Assessment: Defer to PT evaluation   Cervical / Trunk Assessment Cervical / Trunk Assessment: Normal   Communication Communication Communication: No apparent difficulties   Cognition Arousal: Alert Behavior During Therapy: WFL for tasks assessed/performed Cognition: No apparent impairments                               Following commands: Intact       Cueing  General Comments   Cueing Techniques: Verbal cues  Pt Sp02 noted to be 88% on RA during OOB mobility, likely inaccurate as digital pulse ox displaying 94% Sp02   Exercises     Shoulder Instructions      Home Living Family/patient expects to be discharged  to:: Private residence Living Arrangements: Alone Available Help at Discharge: Family;Available PRN/intermittently (daughter) Type of Home: House Home Access: Stairs to enter [employer] of Steps: 1 in the back- takes this route Entrance Stairs-Rails:  (R grab bar) Home Layout: One level     Bathroom Shower/Tub: Producer, television/film/video: Handicapped height (countertop nearby)     Home Equipment: Shower seat;Grab bars - tub/shower;BSC/3in1;Rolling Environmental consultant (2 wheels);Cane - single point;Rollator (4 wheels)   Additional Comments: 1 fall 6 months ago      Prior Functioning/Environment Prior Level of Function : Independent/Modified Independent             Mobility Comments: Ind no AD in home, Mod I with SPC in community ~5ft. ADLs Comments: Ind,.daughter assists with cleaning and takes him to story. Does not drive, has meals on wheels and microwave meals.    OT Problem List: Cardiopulmonary status limiting  activity   OT Treatment/Interventions:        OT Goals(Current goals can be found in the care plan section)   Acute Rehab OT Goals OT Goal Formulation: All assessment and education complete, DC therapy Time For Goal Achievement: 04/26/23 Potential to Achieve Goals: Good   OT Frequency:       Co-evaluation              AM-PAC OT "6 Clicks" Daily Activity     Outcome Measure Help from another person eating meals?: None Help from another person taking care of personal grooming?: None Help from another person toileting, which includes using toliet, bedpan, or urinal?: None Help from another person bathing (including washing, rinsing, drying)?: None Help from another person to put on and taking off regular upper body clothing?: None Help from another person to put on and taking off regular lower body clothing?: None 6 Click Score: 24   End of Session Equipment Utilized During Treatment: Gait belt;Rolling walker (2 wheels) Nurse Communication: Mobility status  Activity Tolerance: Patient tolerated treatment well Patient left: Other (comment) (pt left in care of providing PT standing at doorway)  OT Visit Diagnosis: Other (comment) (SOB)                Time: 0981-1914 OT Time Calculation (min): 16 min Charges:  OT General Charges $OT Visit: 1 Visit OT Evaluation $OT Eval Low Complexity: 1 Low  04/12/2023  AB, OTR/L  Acute Rehabilitation Services  Office: [phone]   Tristan Schroeder 04/12/2023, 1:39 PM

## 2023-04-12 NOTE — Evaluation (Signed)
 Physical Therapy Brief Evaluation and Discharge Note Patient Details Name: Samuel Barnes MRN: [id number] DOB: 09-Apr-1950 Today's Date: 04/12/2023   History of Present Illness  73 y/o male admitted to [facility] on 04/09/23 with acute respiratory failure with hypoxia and acute exacerbation of CHF and COPD.Marland Kitchen PMH: aortic atherosclerosis, HTN, COPD, A-fib on Eliquis, diastolic CHF, stroke, craniotomy (2022)  Clinical Impression  Pt lives in single story home with one step to enter with daughter who is available when needed. Pt independent with ambulation in home, uses scooter at grocery store for energy conservation. Pt is supervision for ambulation in hallway with PT and mod I to return to bed at end of session. Pt reports only single step into home and request not to go into stairwell. Pt is likely at his baseline level of function and will not need any PT at discharge. PT discharge from service and will have Mobility Specialist follow for maintenance of independence with mobility.      PT Assessment Patient does not need any further PT services  Assistance Needed at Discharge  PRN    Equipment Recommendations None recommended by PT     Precautions/Restrictions Precautions Precautions: Fall (low fall risk) Restrictions Weight Bearing Restrictions Per Provider Order: No        Mobility  Bed Mobility     Sit to supine/sidelying: Modified independent (Device/Increased time) General bed mobility comments: HoB elevated and use of rails     Ambulation/Gait Ambulation/Gait assistance: Supervision Gait Distance (Feet): 300 Feet Assistive device: Rolling walker (2 wheels) Gait Pattern/deviations: WFL(Within Functional Limits), Step-through pattern Gait Speed: Pace WFL General Gait Details: step over step, 1x minor LoB with turning corner able to self correct without outside assist  Home Activity Instructions Home Activity Instructions: frequent bouts of ambulation in home during the  day  Stairs Stairs:  (deferred as pt does not like stairwell, able to march in place and demonstrates enough foot clearance)              Balance Overall balance assessment: Mild deficits observed, not formally tested                        Pertinent Vitals/Pain   Pain Assessment Pain Assessment: No/denies pain     Home Living Family/patient expects to be discharged to:: Private residence Living Arrangements: Children Available Help at Discharge: Available PRN/intermittently Home Environment: Stairs to enter  [place] of Steps: 1 Home Equipment: Shower seat;Grab bars - tub/shower;BSC/3in1;Rolling Environmental consultant (2 wheels);Cane - single point;Rollator (4 wheels)   Additional Comments: 1 fall 6 months ago at which point daughter moved in with him    Prior Function Level of Independence: Independent Comments: no longer driving, uses scooter in grocery store    UE/LE Assessment   UE ROM/Strength/Tone/Coordination: [facility]    LE ROM/Strength/Tone/Coordination: [facility]      Communication   Communication Communication: No apparent difficulties     Cognition Overall Cognitive Status: Appears within functional limits for tasks assessed/performed       General Comments General comments (skin integrity, edema, etc.): SpO2 on RA with ambulation >95%O2        Assessment/Plan           No Skilled PT All education completed;Patient at baseline level of functioning;Patient will have necessary level of assist by caregiver at discharge;Patient is supervision for all activity/mobility    AMPAC 6 Clicks Help needed turning from your back to your side while in a flat bed  without using bedrails?: None Help needed moving from lying on your back to sitting on the side of a flat bed without using bedrails?: None Help needed moving to and from a bed to a chair (including a wheelchair)?: None Help needed standing up from a chair using your arms (e.g., wheelchair or bedside  chair)?: None Help needed to walk in hospital room?: None Help needed climbing 3-5 steps with a railing? : A Little 6 Click Score: 23      End of Session Equipment Utilized During Treatment: Gait belt Activity Tolerance: Patient tolerated treatment well Patient left: in bed;with call bell/phone within reach;with bed alarm set Nurse Communication: Mobility status       Time: 9562-1308 PT Time Calculation (min) (ACUTE ONLY): 12 min  Charges:   PT Evaluation $PT Eval Low Complexity: 1 Low      Randie Bloodgood B. Beverely Risen PT, DPT Acute Rehabilitation Services Please use secure chat or  Call Office [phone]   Elon Alas [facility]  04/12/2023, 2:48 PM

## 2023-04-12 NOTE — Progress Notes (Signed)
 Patient continues to have 6-8/10 headaches despite the pain medication received.

## 2023-04-13 DIAGNOSIS — J441 Chronic obstructive pulmonary disease with (acute) exacerbation: Secondary | ICD-10-CM | POA: Diagnosis not present

## 2023-04-13 DIAGNOSIS — N400 Enlarged prostate without lower urinary tract symptoms: Secondary | ICD-10-CM | POA: Diagnosis not present

## 2023-04-13 DIAGNOSIS — I480 Paroxysmal atrial fibrillation: Secondary | ICD-10-CM | POA: Diagnosis not present

## 2023-04-13 DIAGNOSIS — I5033 Acute on chronic diastolic (congestive) heart failure: Secondary | ICD-10-CM | POA: Diagnosis not present

## 2023-04-13 DIAGNOSIS — E871 Hypo-osmolality and hyponatremia: Secondary | ICD-10-CM | POA: Diagnosis not present

## 2023-04-13 LAB — CBC WITH DIFFERENTIAL/PLATELET
Abs Immature Granulocytes: 0.06 10*3/uL (ref 0.00–0.07)
Basophils Absolute: 0 10*3/uL (ref 0.0–0.1)
Basophils Relative: 0 %
Eosinophils Absolute: 0 10*3/uL (ref 0.0–0.5)
Eosinophils Relative: 0 %
HCT: 43.7 % (ref 39.0–52.0)
Hemoglobin: 14.3 g/dL (ref 13.0–17.0)
Immature Granulocytes: 1 %
Lymphocytes Relative: 5 %
Lymphs Abs: 0.4 10*3/uL — ABNORMAL LOW (ref 0.7–4.0)
MCH: 28.7 pg (ref 26.0–34.0)
MCHC: 32.7 g/dL (ref 30.0–36.0)
MCV: 87.6 fL (ref 80.0–100.0)
Monocytes Absolute: 0.4 10*3/uL (ref 0.1–1.0)
Monocytes Relative: 5 %
Neutro Abs: 7 10*3/uL (ref 1.7–7.7)
Neutrophils Relative %: 89 %
Platelets: 212 10*3/uL (ref 150–400)
RBC: 4.99 MIL/uL (ref 4.22–5.81)
RDW: 14.1 % (ref 11.5–15.5)
WBC: 7.9 10*3/uL (ref 4.0–10.5)
nRBC: 0 % (ref 0.0–0.2)

## 2023-04-13 LAB — COMPREHENSIVE METABOLIC PANEL WITH GFR
ALT: 97 U/L — ABNORMAL HIGH (ref 0–44)
AST: 67 U/L — ABNORMAL HIGH (ref 15–41)
Albumin: 3.6 g/dL (ref 3.5–5.0)
Alkaline Phosphatase: 77 U/L (ref 38–126)
Anion gap: 12 (ref 5–15)
BUN: 25 mg/dL — ABNORMAL HIGH (ref 8–23)
CO2: 28 mmol/L (ref 22–32)
Calcium: 9 mg/dL (ref 8.9–10.3)
Chloride: 93 mmol/L — ABNORMAL LOW (ref 98–111)
Creatinine, Ser: 1.03 mg/dL (ref 0.61–1.24)
GFR, Estimated: >60 mL/min (ref >60)
Glucose, Bld: 173 mg/dL — ABNORMAL HIGH (ref 70–99)
Potassium: 3.4 mmol/L — ABNORMAL LOW (ref 3.5–5.1)
Sodium: 133 mmol/L — ABNORMAL LOW (ref 135–145)
Total Bilirubin: 1 mg/dL (ref 0.0–1.2)
Total Protein: 6.3 g/dL — ABNORMAL LOW (ref 6.5–8.1)

## 2023-04-13 LAB — PHOSPHORUS: Phosphorus: 4.3 mg/dL (ref 2.5–4.6)

## 2023-04-13 LAB — MAGNESIUM: Magnesium: 2.1 mg/dL (ref 1.7–2.4)

## 2023-04-13 MED ORDER — SODIUM CHLORIDE 0.9 % IV SOLN: INTRAVENOUS | Status: DC

## 2023-04-13 MED ORDER — FUROSEMIDE 10 MG/ML IJ SOLN
40.0000 mg | Freq: Every day | INTRAMUSCULAR | Status: DC
  Administered 2023-04-14: 40 mg via INTRAVENOUS
  Filled 2023-04-13: qty 4

## 2023-04-13 NOTE — Plan of Care (Signed)
  Problem: Education: Goal: Knowledge of General Education information will improve Description: Including pain rating scale, medication(s)/side effects and non-pharmacologic comfort measures 04/13/2023 1332 by Sanda Linger, RN Outcome: Progressing 04/13/2023 1330 by Sanda Linger, RN Outcome: Progressing   Problem: Health Behavior/Discharge Planning: Goal: Ability to manage health-related needs will improve 04/13/2023 1332 by Sanda Linger, RN Outcome: Progressing 04/13/2023 1330 by Sanda Linger, RN Outcome: Progressing   Problem: Clinical Measurements: Goal: Ability to maintain clinical measurements within normal limits will improve 04/13/2023 1332 by Sanda Linger, RN Outcome: Progressing 04/13/2023 1330 by Sanda Linger, RN Outcome: Progressing Goal: Will remain free from infection 04/13/2023 1332 by Sanda Linger, RN Outcome: Progressing 04/13/2023 1330 by Sanda Linger, RN Outcome: Progressing Goal: Diagnostic test results will improve 04/13/2023 1332 by Sanda Linger, RN Outcome: Progressing 04/13/2023 1330 by Sanda Linger, RN Outcome: Progressing Goal: Respiratory complications will improve 04/13/2023 1332 by Sanda Linger, RN Outcome: Progressing 04/13/2023 1330 by Sanda Linger, RN Outcome: Progressing Goal: Cardiovascular complication will be avoided 04/13/2023 1332 by Sanda Linger, RN Outcome: Progressing 04/13/2023 1330 by Sanda Linger, RN Outcome: Progressing   Problem: Activity: Goal: Risk for activity intolerance will decrease 04/13/2023 1332 by Sanda Linger, RN Outcome: Progressing 04/13/2023 1330 by Sanda Linger, RN Outcome: Progressing   Problem: Nutrition: Goal: Adequate nutrition will be maintained 04/13/2023 1332 by Sanda Linger, RN Outcome: Progressing 04/13/2023 1330 by Sanda Linger, RN Outcome: Progressing   Problem: Coping: Goal: Level of anxiety will decrease 04/13/2023 1332 by Sanda Linger, RN Outcome:  Progressing 04/13/2023 1330 by Sanda Linger, RN Outcome: Progressing   Problem: Elimination: Goal: Will not experience complications related to bowel motility 04/13/2023 1332 by Sanda Linger, RN Outcome: Progressing 04/13/2023 1330 by Sanda Linger, RN Outcome: Progressing Goal: Will not experience complications related to urinary retention 04/13/2023 1332 by Sanda Linger, RN Outcome: Progressing 04/13/2023 1330 by Sanda Linger, RN Outcome: Progressing   Problem: Pain Managment: Goal: General experience of comfort will improve and/or be controlled 04/13/2023 1332 by Sanda Linger, RN Outcome: Progressing 04/13/2023 1330 by Sanda Linger, RN Outcome: Progressing   Problem: Pain Managment: Goal: General experience of comfort will improve and/or be controlled 04/13/2023 1332 by Sanda Linger, RN Outcome: Not Progressing  Still having headaches. Provider aware. Fioricet added yesterday.   Problem: Safety: Goal: Ability to remain free from injury will improve 04/13/2023 1332 by Sanda Linger, RN Outcome: Progressing 04/13/2023 1330 by Sanda Linger, RN Outcome: Progressing   Problem: Skin Integrity: Goal: Risk for impaired skin integrity will decrease 04/13/2023 1332 by Sanda Linger, RN Outcome: Progressing 04/13/2023 1330 by Sanda Linger, RN Outcome: Progressing   04/13/2023 1332 by Sanda Linger, RN Outcome: Progressing 04/13/2023 1330 by Sanda Linger, RN Outcome: Progressing

## 2023-04-13 NOTE — Progress Notes (Signed)
 Mobility Specialist Progress Note;   04/13/23 1133  Mobility  Activity Ambulated with assistance in hallway  Level of Assistance Standby assist, set-up cues, supervision of patient - no hands on  Assistive Device Front wheel walker  Distance Ambulated (ft) 350 ft  Activity Response Tolerated well  Mobility Referral Yes  Mobility visit 1 Mobility  Mobility Specialist Start Time (ACUTE ONLY) 1133  Mobility Specialist Stop Time (ACUTE ONLY) 1149  Mobility Specialist Time Calculation (min) (ACUTE ONLY) 16 min   Pt eager for mobility. Required no physical assistance during ambulation, SV. VSS throughout. Only c/o feeling a bit winded once returned back to bed, otherwise asx. Pt left in bed with all needs met. Daughter in room.   Caesar Bookman Mobility Specialist Please contact via SecureChat or [employer] [phone]

## 2023-04-13 NOTE — Plan of Care (Signed)
 Problem: Education: Goal: Knowledge of General Education information will improve Description: Including pain rating scale, medication(s)/side effects and non-pharmacologic comfort measures 04/13/2023 1352 by Sanda Linger, RN Outcome: Progressing 04/13/2023 1332 by Sanda Linger, RN Outcome: Progressing 04/13/2023 1330 by Sanda Linger, RN Outcome: Progressing   Problem: Health Behavior/Discharge Planning: Goal: Ability to manage health-related needs will improve 04/13/2023 1352 by Sanda Linger, RN Outcome: Progressing 04/13/2023 1332 by Sanda Linger, RN Outcome: Progressing 04/13/2023 1330 by Sanda Linger, RN Outcome: Progressing   Problem: Clinical Measurements: Goal: Ability to maintain clinical measurements within normal limits will improve 04/13/2023 1352 by Sanda Linger, RN Outcome: Progressing 04/13/2023 1332 by Sanda Linger, RN Outcome: Progressing 04/13/2023 1330 by Sanda Linger, RN Outcome: Progressing Goal: Will remain free from infection 04/13/2023 1352 by Sanda Linger, RN Outcome: Progressing 04/13/2023 1332 by Sanda Linger, RN Outcome: Progressing 04/13/2023 1330 by Sanda Linger, RN Outcome: Progressing Goal: Diagnostic test results will improve 04/13/2023 1352 by Sanda Linger, RN Outcome: Progressing 04/13/2023 1332 by Sanda Linger, RN Outcome: Progressing 04/13/2023 1330 by Sanda Linger, RN Outcome: Progressing Goal: Respiratory complications will improve 04/13/2023 1352 by Sanda Linger, RN Outcome: Progressing 04/13/2023 1332 by Sanda Linger, RN Outcome: Progressing 04/13/2023 1330 by Sanda Linger, RN Outcome: Progressing Goal: Cardiovascular complication will be avoided 04/13/2023 1352 by Sanda Linger, RN Outcome: Progressing 04/13/2023 1332 by Sanda Linger, RN Outcome: Progressing 04/13/2023 1330 by Sanda Linger, RN Outcome: Progressing   Problem: Activity: Goal: Risk for activity intolerance will decrease 04/13/2023 1352  by Sanda Linger, RN Outcome: Progressing 04/13/2023 1332 by Sanda Linger, RN Outcome: Progressing 04/13/2023 1330 by Sanda Linger, RN Outcome: Progressing   Problem: Nutrition: Goal: Adequate nutrition will be maintained 04/13/2023 1352 by Sanda Linger, RN Outcome: Progressing 04/13/2023 1332 by Sanda Linger, RN Outcome: Progressing 04/13/2023 1330 by Sanda Linger, RN Outcome: Progressing   Problem: Coping: Goal: Level of anxiety will decrease 04/13/2023 1352 by Sanda Linger, RN Outcome: Progressing 04/13/2023 1332 by Sanda Linger, RN Outcome: Progressing 04/13/2023 1330 by Sanda Linger, RN Outcome: Progressing   Problem: Elimination: Goal: Will not experience complications related to bowel motility 04/13/2023 1352 by Sanda Linger, RN Outcome: Progressing 04/13/2023 1332 by Sanda Linger, RN Outcome: Progressing 04/13/2023 1330 by Sanda Linger, RN Outcome: Progressing Goal: Will not experience complications related to urinary retention 04/13/2023 1352 by Sanda Linger, RN Outcome: Progressing 04/13/2023 1332 by Sanda Linger, RN Outcome: Progressing 04/13/2023 1330 by Sanda Linger, RN Outcome: Progressing   Problem: Pain Managment: Goal: General experience of comfort will improve and/or be controlled 04/13/2023 1352 by Sanda Linger, RN Outcome: Not Progressing Note: Fioricet added as tylenol is not helping with the pt's pain.  04/13/2023 1332 by Sanda Linger, RN Outcome: Not Progressing 04/13/2023 1332 by Sanda Linger, RN Outcome: Progressing 04/13/2023 1330 by Sanda Linger, RN Outcome: Progressing   Problem: Safety: Goal: Ability to remain free from injury will improve 04/13/2023 1352 by Sanda Linger, RN Outcome: Progressing 04/13/2023 1332 by Sanda Linger, RN Outcome: Progressing 04/13/2023 1330 by Sanda Linger, RN Outcome: Progressing   Problem: Skin Integrity: Goal: Risk for impaired skin integrity will decrease 04/13/2023 1352 by  Sanda Linger, RN Outcome: Progressing 04/13/2023 1332 by Sanda Linger, RN Outcome: Progressing 04/13/2023 1330 by Sanda Linger, RN Outcome: Progressing

## 2023-04-13 NOTE — H&P (View-Only) (Signed)
 DAILY PROGRESS NOTE   Patient Name: Samuel Barnes Date of Encounter: 04/13/2023 Cardiologist: Armanda Magic, MD  Chief Complaint   Breathing better  Patient Profile   Samuel Barnes is a 73 y.o. 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 Marland Mcalpine.   Subjective   Significant diuresis overnight- now 3.6L additional negative, overall 7.4L negative.  Labs stable today. Weight is down 3 kg over the past 2 days.  Objective   Vitals:   04/12/23 1051 04/12/23 2037 04/13/23 0439 04/13/23 0743  BP: (!) 163/72 (!) 143/71 (!) 160/68   Pulse: (!) 49 (!) 56 (!) 51 (!) 55  Resp: 16 20 16    Temp: 97.6 F (36.4 C) 97.9 F (36.6 C) 97.6 F (36.4 C)   TempSrc: Oral Oral Oral   SpO2:  96% 95% 97%  Weight:   95.9 kg   Height:        Intake/Output Summary (Last 24 hours) at 04/13/2023 1028 Last data filed at 04/13/2023 0432 Gross per 24 hour  Intake 960 ml  Output 4020 ml  Net -3060 ml   Filed Weights   04/11/23 0502 04/12/23 0342 04/13/23 0439  Weight: 99 kg 98.9 kg 95.9 kg    Physical Exam   General appearance: alert and no distress Neck: JVD - 1 cm above sternal notch, no carotid bruit, and thyroid not enlarged, symmetric, no tenderness/mass/nodules Lungs: diminished breath sounds bibasilar Heart: regular bradycardia Extremities: extremities normal, atraumatic, no cyanosis or edema Neurologic: Grossly normal  Inpatient Medications    Scheduled Meds:  amiodarone  200 mg Oral Daily   amLODipine  5 mg Oral Daily   apixaban  5 mg Oral BID   busPIRone  5 mg Oral BID   citalopram  20 mg Oral Daily   empagliflozin  10 mg Oral Daily   furosemide  40 mg Intravenous BID   losartan  25 mg Oral Daily   methylPREDNISolone (SOLU-MEDROL) injection  60 mg Intravenous Q12H   montelukast   10 mg Oral Daily   pravastatin  20 mg Oral q1800   terazosin  5 mg Oral Daily    Continuous Infusions:  azithromycin 500 mg (04/12/23 2143)    PRN Meds: acetaminophen **OR** acetaminophen, albuterol, butalbital-acetaminophen-caffeine, melatonin, ondansetron (ZOFRAN) IV   Labs   Results for orders placed or performed during the hospital encounter of 04/09/23 (from the past 48 hours)  CBC with Differential/Platelet     Status: Abnormal   Collection Time: 04/12/23  4:37 AM  Result Value Ref Range   WBC 7.3 4.0 - 10.5 K/uL   RBC 4.24 4.22 - 5.81 MIL/uL   Hemoglobin 12.1 (L) 13.0 - 17.0 g/dL   HCT 98.1 (L) 19.1 - 47.8 %   MCV 88.4 80.0 - 100.0 fL   MCH 28.5 26.0 - 34.0 pg   MCHC 32.3 30.0 - 36.0 g/dL   RDW 29.5 62.1 - 30.8 %   Platelets 167 150 - 400 K/uL   nRBC 0.0 0.0 - 0.2 %   Neutrophils Relative % 92 %   Neutro Abs 6.8 1.7 - 7.7 K/uL   Lymphocytes Relative 4 %   Lymphs Abs 0.3 (L) 0.7 - 4.0 K/uL   Monocytes Relative 3 %   Monocytes Absolute 0.2 0.1 - 1.0 K/uL  Eosinophils Relative 0 %   Eosinophils Absolute 0.0 0.0 - 0.5 K/uL   Basophils Relative 0 %   Basophils Absolute 0.0 0.0 - 0.1 K/uL   Immature Granulocytes 1 %   Abs Immature Granulocytes 0.04 0.00 - 0.07 K/uL    Comment: Performed at [facility], [address] [place], [place] [address]  Comprehensive metabolic panel with GFR     Status: Abnormal   Collection Time: 04/12/23  4:37 AM  Result Value Ref Range   Sodium 133 (L) 135 - 145 mmol/L   Potassium 4.0 3.5 - 5.1 mmol/L   Chloride 95 (L) 98 - 111 mmol/L   CO2 28 22 - 32 mmol/L   Glucose, Bld 180 (H) 70 - 99 mg/dL    Comment: Glucose reference range applies only to samples taken after fasting for at least 8 hours.   BUN 21 8 - 23 mg/dL   Creatinine, Ser 9.14 0.61 - 1.24 mg/dL   Calcium 8.7 (L) 8.9 - 10.3 mg/dL   Total Protein 5.9 (L) 6.5 - 8.1 g/dL   Albumin 3.4 (L) 3.5 - 5.0 g/dL   AST 56 (H) 15 - 41 U/L   ALT 60 (H) 0 - 44 U/L   Alkaline  Phosphatase 70 38 - 126 U/L   Total Bilirubin 0.8 0.0 - 1.2 mg/dL   GFR, Estimated >78 >29 mL/min    Comment: (NOTE) Calculated using the CKD-EPI Creatinine Equation (2021)    Anion gap 10 5 - 15    Comment: Performed at [facility], [address], [place], [place] [address]  Magnesium     Status: None   Collection Time: 04/12/23  4:37 AM  Result Value Ref Range   Magnesium 2.0 1.7 - 2.4 mg/dL    Comment: Performed at [facility], [address], [place], [place] [address]  Phosphorus     Status: None   Collection Time: 04/12/23  4:37 AM  Result Value Ref Range   Phosphorus 3.7 2.5 - 4.6 mg/dL    Comment: Performed at [facility], [address], [place], [place] [address]    ECG   N/A  Telemetry   Sinus bradycardia - Personally Reviewed  Radiology    DG CHEST PORT 1 VIEW Result Date: 04/12/2023 CLINICAL DATA:  Shortness of breath EXAM: PORTABLE CHEST 1 VIEW COMPARISON:  04/11/2023 FINDINGS: Cardiomegaly. Mediastinal contour stable. Right lower airspace opacity is increasing since prior study, likely a combination of pleural effusion and airspace disease. Left lung clear. No edema. No acute bony abnormality. IMPRESSION: Worsening right pleural effusion and right lower lobe atelectasis or infiltrate. Electronically Signed   By: Charlett Nose M.D.   On: 04/12/2023 10:12    Cardiac Studies   N/A  Assessment   Principal Problem:   Acute on chronic diastolic CHF (congestive heart failure) (HCC) Active Problems:   HLD (hyperlipidemia)   BPH (benign prostatic hyperplasia)   Essential hypertension   Chronic hyponatremia   Paroxysmal atrial fibrillation (HCC)   SOB (shortness of breath)   COPD with acute exacerbation (HCC)   GAD (generalized anxiety disorder)   Plan   Significant additional diuresis yesterday with stable renal function. Seems to be near euvolemic. Will decrease lasix to 40 mg IV daily today. Discussed RHC to evaluate volume status and  degree of diastolic dysfunction - ?amyloid given severe biatrial enlargement. He is agreeable to proceed tomorrow.  Informed Consent   Shared Decision Making/Informed Consent The  risks [stroke (1 in 1000), death (1 in 1000), kidney failure [usually temporary] (1 in 500), bleeding (1 in 200), allergic reaction [possibly serious] (1 in 200)], benefits (diagnostic support and management of coronary artery disease) and alternatives of a cardiac catheterization were discussed in detail with Mr. Dershem and he is willing to proceed.      Time Spent Directly with Patient:  I have spent a total of 25 minutes with the patient reviewing hospital notes, telemetry, EKGs, labs and examining the patient as well as establishing an assessment and plan that was discussed personally with the patient.  > 50% of time was spent in direct patient care.  Length of Stay:   LOS: 4 days   Chrystie Nose, MD, [facility], FACP  [facility]  Medical Director of the Advanced Lipid Disorders &  Cardiovascular Risk Reduction Clinic Diplomate of the American Board of Clinical Lipidology Attending Cardiologist  Direct Dial: [phone]  Fax: [phone]  Website:  [email or web address]  Lisette Abu Rogue Rafalski 04/13/2023, 10:28 AM

## 2023-04-13 NOTE — Plan of Care (Signed)

## 2023-04-13 NOTE — Progress Notes (Signed)
 DAILY PROGRESS NOTE   Patient Name: Samuel Barnes Date of Encounter: 04/13/2023 Cardiologist: Armanda Magic, MD  Chief Complaint   Breathing better  Patient Profile   Samuel Barnes is a 73 y.o. 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 Marland Mcalpine.   Subjective   Significant diuresis overnight- now 3.6L additional negative, overall 7.4L negative.  Labs stable today. Weight is down 3 kg over the past 2 days.  Objective   Vitals:   04/12/23 1051 04/12/23 2037 04/13/23 0439 04/13/23 0743  BP: (!) 163/72 (!) 143/71 (!) 160/68   Pulse: (!) 49 (!) 56 (!) 51 (!) 55  Resp: 16 20 16    Temp: 97.6 F (36.4 C) 97.9 F (36.6 C) 97.6 F (36.4 C)   TempSrc: Oral Oral Oral   SpO2:  96% 95% 97%  Weight:   95.9 kg   Height:        Intake/Output Summary (Last 24 hours) at 04/13/2023 1028 Last data filed at 04/13/2023 0432 Gross per 24 hour  Intake 960 ml  Output 4020 ml  Net -3060 ml   Filed Weights   04/11/23 0502 04/12/23 0342 04/13/23 0439  Weight: 99 kg 98.9 kg 95.9 kg    Physical Exam   General appearance: alert and no distress Neck: JVD - 1 cm above sternal notch, no carotid bruit, and thyroid not enlarged, symmetric, no tenderness/mass/nodules Lungs: diminished breath sounds bibasilar Heart: regular bradycardia Extremities: extremities normal, atraumatic, no cyanosis or edema Neurologic: Grossly normal  Inpatient Medications    Scheduled Meds:  amiodarone  200 mg Oral Daily   amLODipine  5 mg Oral Daily   apixaban  5 mg Oral BID   busPIRone  5 mg Oral BID   citalopram  20 mg Oral Daily   empagliflozin  10 mg Oral Daily   furosemide  40 mg Intravenous BID   losartan  25 mg Oral Daily   methylPREDNISolone (SOLU-MEDROL) injection  60 mg Intravenous Q12H   montelukast   10 mg Oral Daily   pravastatin  20 mg Oral q1800   terazosin  5 mg Oral Daily    Continuous Infusions:  azithromycin 500 mg (04/12/23 2143)    PRN Meds: acetaminophen **OR** acetaminophen, albuterol, butalbital-acetaminophen-caffeine, melatonin, ondansetron (ZOFRAN) IV   Labs   Results for orders placed or performed during the hospital encounter of 04/09/23 (from the past 48 hours)  CBC with Differential/Platelet     Status: Abnormal   Collection Time: 04/12/23  4:37 AM  Result Value Ref Range   WBC 7.3 4.0 - 10.5 K/uL   RBC 4.24 4.22 - 5.81 MIL/uL   Hemoglobin 12.1 (L) 13.0 - 17.0 g/dL   HCT 98.1 (L) 19.1 - 47.8 %   MCV 88.4 80.0 - 100.0 fL   MCH 28.5 26.0 - 34.0 pg   MCHC 32.3 30.0 - 36.0 g/dL   RDW 29.5 62.1 - 30.8 %   Platelets 167 150 - 400 K/uL   nRBC 0.0 0.0 - 0.2 %   Neutrophils Relative % 92 %   Neutro Abs 6.8 1.7 - 7.7 K/uL   Lymphocytes Relative 4 %   Lymphs Abs 0.3 (L) 0.7 - 4.0 K/uL   Monocytes Relative 3 %   Monocytes Absolute 0.2 0.1 - 1.0 K/uL  Eosinophils Relative 0 %   Eosinophils Absolute 0.0 0.0 - 0.5 K/uL   Basophils Relative 0 %   Basophils Absolute 0.0 0.0 - 0.1 K/uL   Immature Granulocytes 1 %   Abs Immature Granulocytes 0.04 0.00 - 0.07 K/uL    Comment: Performed at [facility], [address] [place], [place] [address]  Comprehensive metabolic panel with GFR     Status: Abnormal   Collection Time: 04/12/23  4:37 AM  Result Value Ref Range   Sodium 133 (L) 135 - 145 mmol/L   Potassium 4.0 3.5 - 5.1 mmol/L   Chloride 95 (L) 98 - 111 mmol/L   CO2 28 22 - 32 mmol/L   Glucose, Bld 180 (H) 70 - 99 mg/dL    Comment: Glucose reference range applies only to samples taken after fasting for at least 8 hours.   BUN 21 8 - 23 mg/dL   Creatinine, Ser 9.14 0.61 - 1.24 mg/dL   Calcium 8.7 (L) 8.9 - 10.3 mg/dL   Total Protein 5.9 (L) 6.5 - 8.1 g/dL   Albumin 3.4 (L) 3.5 - 5.0 g/dL   AST 56 (H) 15 - 41 U/L   ALT 60 (H) 0 - 44 U/L   Alkaline  Phosphatase 70 38 - 126 U/L   Total Bilirubin 0.8 0.0 - 1.2 mg/dL   GFR, Estimated >78 >29 mL/min    Comment: (NOTE) Calculated using the CKD-EPI Creatinine Equation (2021)    Anion gap 10 5 - 15    Comment: Performed at [facility], [address], [place], [place] [address]  Magnesium     Status: None   Collection Time: 04/12/23  4:37 AM  Result Value Ref Range   Magnesium 2.0 1.7 - 2.4 mg/dL    Comment: Performed at [facility], [address], [place], [place] [address]  Phosphorus     Status: None   Collection Time: 04/12/23  4:37 AM  Result Value Ref Range   Phosphorus 3.7 2.5 - 4.6 mg/dL    Comment: Performed at [facility], [address], [place], [place] [address]    ECG   N/A  Telemetry   Sinus bradycardia - Personally Reviewed  Radiology    DG CHEST PORT 1 VIEW Result Date: 04/12/2023 CLINICAL DATA:  Shortness of breath EXAM: PORTABLE CHEST 1 VIEW COMPARISON:  04/11/2023 FINDINGS: Cardiomegaly. Mediastinal contour stable. Right lower airspace opacity is increasing since prior study, likely a combination of pleural effusion and airspace disease. Left lung clear. No edema. No acute bony abnormality. IMPRESSION: Worsening right pleural effusion and right lower lobe atelectasis or infiltrate. Electronically Signed   By: Charlett Nose M.D.   On: 04/12/2023 10:12    Cardiac Studies   N/A  Assessment   Principal Problem:   Acute on chronic diastolic CHF (congestive heart failure) (HCC) Active Problems:   HLD (hyperlipidemia)   BPH (benign prostatic hyperplasia)   Essential hypertension   Chronic hyponatremia   Paroxysmal atrial fibrillation (HCC)   SOB (shortness of breath)   COPD with acute exacerbation (HCC)   GAD (generalized anxiety disorder)   Plan   Significant additional diuresis yesterday with stable renal function. Seems to be near euvolemic. Will decrease lasix to 40 mg IV daily today. Discussed RHC to evaluate volume status and  degree of diastolic dysfunction - ?amyloid given severe biatrial enlargement. He is agreeable to proceed tomorrow.  Informed Consent   Shared Decision Making/Informed Consent The  risks [stroke (1 in 1000), death (1 in 1000), kidney failure [usually temporary] (1 in 500), bleeding (1 in 200), allergic reaction [possibly serious] (1 in 200)], benefits (diagnostic support and management of coronary artery disease) and alternatives of a cardiac catheterization were discussed in detail with Mr. Dershem and he is willing to proceed.      Time Spent Directly with Patient:  I have spent a total of 25 minutes with the patient reviewing hospital notes, telemetry, EKGs, labs and examining the patient as well as establishing an assessment and plan that was discussed personally with the patient.  > 50% of time was spent in direct patient care.  Length of Stay:   LOS: 4 days   Chrystie Nose, MD, [facility], FACP  [facility]  Medical Director of the Advanced Lipid Disorders &  Cardiovascular Risk Reduction Clinic Diplomate of the American Board of Clinical Lipidology Attending Cardiologist  Direct Dial: [phone]  Fax: [phone]  Website:  [email or web address]  Lisette Abu Rogue Rafalski 04/13/2023, 10:28 AM

## 2023-04-13 NOTE — Progress Notes (Signed)
 PROGRESS NOTE    Samuel Barnes [facility]  ZOX:[id number] DOB: [birth year] DOA: 04/09/2023 PCP: [name], PA   Brief Narrative:  The patient is a 73 year old Caucasian male with a past medical history significant for but not limited to paroxysmal atrial fibrillation on anticoagulation, chronic diastolic CHF, history of intracranial hemorrhage and September 2022, BPH, chronic hyponatremia, essential hypertension, hyperlipidemia, who presented from [facility] ED for acute shortness of breath that has been worsening over the last several weeks.  Being admitted and treated for acute respiratory failure with hypoxia in the setting of an acute exacerbation of COPD and acute exacerbation on chronic diastolic CHF. Cardiology evaluating and are in a right heart catheterization to evaluate his volume status and degree of diastolic dysfunction.  Assessment and Plan:  Acute Exacerbation of Diastolic CHF / Severe TR / Mild to Moderate MR / Pulmonary HTN: BNP elevated at 775.4 on Admission. ECHO this visit shows EF of 55-60% Grade 3 DD. Diuresis with IV Lasix 40 mg BID now being reduced to IV 40 mg Daily. Strict Is and Os and Daily Weights. Repeat CXR as below. Cardiology consulted for further evaluation and recommendations and they are planning for a right heart cath next week given his significant diastolic dysfunction and severe bilateral atrial enlargement with concern for possible amyloid. Holding his beta-blocker with Metoprolol Succinate 25 mg p.o. daily given his bradycardia; Cardiology recommends considering starting spironolactone 12.5 mg p.o. daily but have initiated SGLT2 inhibitor with empagliflozin 10 mg p.o. daily.  Plan is for Right heart cath tomorrow.  Acute Exacerbation of COPD: C/w Steroids with Solumedrol and start weaning from IV Methylprednisolone 80 mg BID to 60 mg BID. Initiated on IV Azithromycin. Flutter Valve and Incentive Spirometry. DuoNeb q6h now stopped. C/w Montelukast 10  mg po Daily C/w Albuterol Neb 2.5 mg q4hprn. CXR done and showed Bibasilar ATX as well as a Small R sided Pleural Effusion; repeat CXR done yesterday and showed "Worsening right pleural effusion and right lower lobe atelectasis or infiltrate."  Acute Respiratory Failure with Hypoxia: In the setting of Above. C/w Diuresis and Treatment. Wean O2 as tolerated as he does not wear oxygen at home. SpO2: 97 % O2 Flow Rate (L/min): 2 L/min; Will need an Ambulatory Home O2 Screen prior to D/C; was able to be weaned off of O2  PAF: Has a CHA2DS2-VASc of 3. Metoprolol Succinate held. C/w Amiodarone 200 mg po Daily. C/w Anticoagulation with Apixaban 5 mg po BID. ECHOCardiogram as above; Cardiology is following and appreciate further management and planning RHC in the AM  Essential HTN: C/w Amlodipine 5 mg po Daily; Holding Metoprolol Succinate 12.5 mg po Daily given Bradycardia. C/w Terazosin 5 mg po daily. CTM BP per Protocol. Last BP Reading was 138/65  HLD: C/w Lovastatin substitution for Pravastatin 20 mg po Daily  HypoNa+: Likely 2/2 to Hypervolemia but has chronic hypoosmolar hyponatremia. B/L ranging from 130-134. C/w Diuresis. Na+ went from 132 -> 130 -> 133 again on last check. CTM and Trend and repeat CMP in the AM   BPH: C/w Terazosin 5 mg po Daily   Normocytic Anemia: Hgb/Hct went from 12.7/39.8 -> 12.4/37.6 -> 12.4/38.0 -> 12.1/37.5 on last check. Anemia Panel showed an iron level of 35, UIBC 351, TIBC 386, saturation ration 9%, ferritin level 38, folate 20.6 and a vitamin B12 level of 955 CTM for S/Sx of Bleeding; No overt bleeding noted. Repeat CBC in the AM   Generalized Anxiety Disorder: C/w Buspirone 5 mg  po BID and Citalopram 20 mg po Daily   Abnormal LFTs: Mild and likely in the setting of Volume Overload. AST is now 45 -> 42 -> 56 and ALT is now 53 -> 55 -> 60. Repeat LFTs today pending. CTM and Trend and if not improving will obtain a right upper quadrant ultrasound as well as an acute  hepatitis panel. Repeat CMP in AM.  Hypoalbuminemia: Patient's Albumin level went from 3.7 -> 3.8 -> 3.4 on last check. CTM and Trend and repeat CMP in the AM   Class I Obesity: Complicates overall prognosis and care. Estimated body mass index is 31.23 kg/m as calculated from the following:   Height as of this encounter: 5\' 9"  (1.753 m).   Weight as of this encounter: 95.9 kg. Weight Loss and Dietary Counseling given   DVT prophylaxis:  apixaban (ELIQUIS) tablet 5 mg    Code Status: Full Code Family Communication: No family present at bedside  Disposition Plan:  Level of care: Telemetry Cardiac Status is: Inpatient Remains inpatient appropriate because: His further clinical improvement and will be undergoing a right heart catheterization in the morning  Consultants:  Cardiology  Procedures:  As delineated as above  Antimicrobials:  Anti-infectives (From admission, onward)    Start     Dose/Rate Route Frequency Ordered Stop   04/09/23 2200  azithromycin (ZITHROMAX) 500 mg in sodium chloride 0.9 % 250 mL IVPB       Note to Pharmacy: (In setting of acute copd exac)   500 mg 250 mL/hr over 60 Minutes Intravenous Every 24 hours 04/09/23 2053 04/14/23 2159       Subjective: Seen and examined at bedside and thinks he is doing okay thinks his breathing is a little bit better.  States that he lost more weight and feels a little bit better.  Denied any lightheadedness or dizziness.  No other concerns or complaints at this time.  Objective: Vitals:   04/12/23 2037 04/13/23 0439 04/13/23 0743 04/13/23 1256  BP: (!) 143/71 (!) 160/68  138/65  Pulse: (!) 56 (!) 51 (!) 55 (!) 53  Resp: 20 16  16   Temp: 97.9 F (36.6 C) 97.6 F (36.4 C)  97.9 F (36.6 C)  TempSrc: Oral Oral  Oral  SpO2: 96% 95% 97%   Weight:  95.9 kg    Height:        Intake/Output Summary (Last 24 hours) at 04/13/2023 1502 Last data filed at 04/13/2023 1319 Gross per 24 hour  Intake 720 ml  Output 4520 ml   Net -3800 ml   Filed Weights   04/11/23 0502 04/12/23 0342 04/13/23 0439  Weight: 99 kg 98.9 kg 95.9 kg   Examination: Physical Exam:  Constitutional: WN/WD obese Caucasian male in no acute distress appears calm Respiratory: Diminished to auscultation bilaterally with some coarse breath sounds, no wheezing, rales, rhonchi or crackles. Normal respiratory effort and patient is not tachypenic. No accessory muscle use.  Wearing supplemental oxygen via nasal cannula Cardiovascular: RRR, no murmurs / rubs / gallops. S1 and S2 auscultated. No appreciable lower extremity edema Abdomen: Soft, non-tender, non-distended. No masses palpated. No appreciable hepatosplenomegaly. Bowel sounds positive.  GU: Deferred. Musculoskeletal: No clubbing / cyanosis of digits/nails. No joint deformity upper and lower extremities.  Skin: No rashes, lesions, ulcers on a limited skin evaluation. No induration; Warm and dry.  Neurologic: CN 2-12 grossly intact with no focal deficits. Romberg sign and cerebellar reflexes not assessed.  Psychiatric: Normal judgment and insight. Alert and  oriented x 3. Normal mood and appropriate affect.   Data Reviewed: I have personally reviewed following labs and imaging studies  CBC: Recent Labs  Lab 04/09/23 1413 04/10/23 0531 04/11/23 0530 04/12/23 0437  WBC 4.2 4.3 6.6 7.3  NEUTROABS  --  3.7 6.1 6.8  HGB 12.7* 12.4* 12.4* 12.1*  HCT 39.8 37.6* 38.0* 37.5*  MCV 89.4 87.2 87.8 88.4  PLT 165 158 184 167   Basic Metabolic Panel: Recent Labs  Lab 04/09/23 1413 04/10/23 0531 04/11/23 0530 04/12/23 0437  NA 132* 130* 133* 133*  K 3.9 4.2 4.3 4.0  CL 95* 94* 94* 95*  CO2 28 26 28 28   GLUCOSE 135* 132* 155* 180*  BUN 15 16 22 21   CREATININE 0.60* 0.62 0.73 0.72  CALCIUM 8.7* 8.7* 9.0 8.7*  MG  --  2.1 2.2 2.0  PHOS  --  2.9 3.4 3.7   GFR: Estimated Creatinine Clearance: 94 mL/min (by C-G formula based on SCr of 0.72 mg/dL). Liver Function Tests: Recent Labs   Lab 04/10/23 0531 04/11/23 0530 04/12/23 0437  AST 45* 42* 56*  ALT 53* 55* 60*  ALKPHOS 74 69 70  BILITOT 1.0 0.8 0.8  PROT 6.5 6.7 5.9*  ALBUMIN 3.7 3.8 3.4*   No results for input(s): "LIPASE", "AMYLASE" in the last 168 hours. No results for input(s): "AMMONIA" in the last 168 hours. Coagulation Profile: No results for input(s): "INR", "PROTIME" in the last 168 hours. Cardiac Enzymes: No results for input(s): "CKTOTAL", "CKMB", "CKMBINDEX", "TROPONINI" in the last 168 hours. BNP (last 3 results) No results for input(s): "PROBNP" in the last 8760 hours. HbA1C: No results for input(s): "HGBA1C" in the last 72 hours. CBG: Recent Labs  Lab 04/09/23 1414  GLUCAP 148*   Lipid Profile: No results for input(s): "CHOL", "HDL", "LDLCALC", "TRIG", "CHOLHDL", "LDLDIRECT" in the last 72 hours. Thyroid Function Tests: No results for input(s): "TSH", "T4TOTAL", "FREET4", "T3FREE", "THYROIDAB" in the last 72 hours. Anemia Panel: Recent Labs    04/11/23 0530  VITAMINB12 955*  FOLATE 28.6  FERRITIN 38  TIBC 386  IRON 35*  RETICCTPCT 1.4   Sepsis Labs: Recent Labs  Lab 04/10/23 0531  PROCALCITON <0.10   Recent Results (from the past 240 hours)  Resp panel by RT-PCR (RSV, Flu A&B, Covid) Anterior Nasal Swab     Status: None   Collection Time: 04/09/23  2:13 PM   Specimen: Anterior Nasal Swab  Result Value Ref Range Status   SARS Coronavirus 2 by RT PCR NEGATIVE NEGATIVE Final    Comment: (NOTE) SARS-CoV-2 target nucleic acids are NOT DETECTED.  The SARS-CoV-2 RNA is generally detectable in upper respiratory specimens during the acute phase of infection. The lowest concentration of SARS-CoV-2 viral copies this assay can detect is 138 copies/mL. A negative result does not preclude SARS-Cov-2 infection and should not be used as the sole basis for treatment or other patient management decisions. A negative result may occur with  improper specimen collection/handling,  submission of specimen other than nasopharyngeal swab, presence of viral mutation(s) within the areas targeted by this assay, and inadequate number of viral copies(<138 copies/mL). A negative result must be combined with clinical observations, patient history, and epidemiological information. The expected result is Negative.  Fact Sheet for Patients:  BloggerCourse.com  Fact Sheet for Healthcare Providers:  SeriousBroker.it  This test is no t yet approved or cleared by the [place] FDA and  has been authorized for detection and/or diagnosis of SARS-CoV-2 by FDA  under an Emergency Use Authorization (EUA). This EUA will remain  in effect (meaning this test can be used) for the duration of the COVID-19 declaration under Section 564(b)(1) of the Act, 21 U.S.C.section 360bbb-3(b)(1), unless the authorization is terminated  or revoked sooner.       Influenza A by PCR NEGATIVE NEGATIVE Final   Influenza B by PCR NEGATIVE NEGATIVE Final    Comment: (NOTE) The Xpert Xpress SARS-CoV-2/FLU/RSV plus assay is intended as an aid in the diagnosis of influenza from Nasopharyngeal swab specimens and should not be used as a sole basis for treatment. Nasal washings and aspirates are unacceptable for Xpert Xpress SARS-CoV-2/FLU/RSV testing.  Fact Sheet for Patients: BloggerCourse.com  Fact Sheet for Healthcare Providers: SeriousBroker.it  This test is not yet approved or cleared by the [place] FDA and has been authorized for detection and/or diagnosis of SARS-CoV-2 by FDA under an Emergency Use Authorization (EUA). This EUA will remain in effect (meaning this test can be used) for the duration of the COVID-19 declaration under Section 564(b)(1) of the Act, 21 U.S.C. section 360bbb-3(b)(1), unless the authorization is terminated or revoked.     Resp Syncytial Virus by PCR NEGATIVE  NEGATIVE Final    Comment: (NOTE) Fact Sheet for Patients: BloggerCourse.com  Fact Sheet for Healthcare Providers: SeriousBroker.it  This test is not yet approved or cleared by the [place] FDA and has been authorized for detection and/or diagnosis of SARS-CoV-2 by FDA under an Emergency Use Authorization (EUA). This EUA will remain in effect (meaning this test can be used) for the duration of the COVID-19 declaration under Section 564(b)(1) of the Act, 21 U.S.C. section 360bbb-3(b)(1), unless the authorization is terminated or revoked.  Performed at [facility], [address] [place], [place] [address]     Radiology Studies: DG CHEST PORT 1 VIEW Result Date: 04/12/2023 CLINICAL DATA:  Shortness of breath EXAM: PORTABLE CHEST 1 VIEW COMPARISON:  04/11/2023 FINDINGS: Cardiomegaly. Mediastinal contour stable. Right lower airspace opacity is increasing since prior study, likely a combination of pleural effusion and airspace disease. Left lung clear. No edema. No acute bony abnormality. IMPRESSION: Worsening right pleural effusion and right lower lobe atelectasis or infiltrate. Electronically Signed   By: Charlett Nose M.D.   On: 04/12/2023 10:12   Scheduled Meds:  amiodarone  200 mg Oral Daily   amLODipine  5 mg Oral Daily   apixaban  5 mg Oral BID   busPIRone  5 mg Oral BID   citalopram  20 mg Oral Daily   empagliflozin  10 mg Oral Daily   [START ON 04/14/2023] furosemide  40 mg Intravenous Daily   losartan  25 mg Oral Daily   methylPREDNISolone (SOLU-MEDROL) injection  60 mg Intravenous Q12H   montelukast  10 mg Oral Daily   pravastatin  20 mg Oral q1800   terazosin  5 mg Oral Daily   Continuous Infusions:  azithromycin 500 mg (04/12/23 2143)    LOS: 4 days   Marguerita Merles, DO [facility] Available via Epic secure chat 7am-7pm After these hours, please refer to coverage provider listed on  [email or web address] 04/13/2023, 3:02 PM

## 2023-04-14 ENCOUNTER — Other Ambulatory Visit (HOSPITAL_COMMUNITY): Payer: Self-pay

## 2023-04-14 ENCOUNTER — Telehealth: Payer: Self-pay

## 2023-04-14 ENCOUNTER — Encounter (HOSPITAL_COMMUNITY)
Admission: EM | Disposition: A | Discharge status: Home / Self Care | Payer: Self-pay | Source: Home / Self Care | Attending: Internal Medicine

## 2023-04-14 ENCOUNTER — Telehealth (HOSPITAL_COMMUNITY): Payer: Self-pay | Admitting: Pharmacy Technician

## 2023-04-14 ENCOUNTER — Encounter (HOSPITAL_COMMUNITY): Payer: Self-pay | Admitting: Internal Medicine

## 2023-04-14 DIAGNOSIS — N400 Enlarged prostate without lower urinary tract symptoms: Secondary | ICD-10-CM | POA: Diagnosis not present

## 2023-04-14 DIAGNOSIS — I5033 Acute on chronic diastolic (congestive) heart failure: Secondary | ICD-10-CM | POA: Diagnosis not present

## 2023-04-14 DIAGNOSIS — J441 Chronic obstructive pulmonary disease with (acute) exacerbation: Secondary | ICD-10-CM | POA: Diagnosis not present

## 2023-04-14 DIAGNOSIS — E871 Hypo-osmolality and hyponatremia: Secondary | ICD-10-CM | POA: Diagnosis not present

## 2023-04-14 LAB — CBC WITH DIFFERENTIAL/PLATELET
Abs Immature Granulocytes: 0.06 10*3/uL (ref 0.00–0.07)
Basophils Absolute: 0 10*3/uL (ref 0.0–0.1)
Basophils Relative: 0 %
Eosinophils Absolute: 0 10*3/uL (ref 0.0–0.5)
Eosinophils Relative: 0 %
HCT: 44.9 % (ref 39.0–52.0)
Hemoglobin: 14.6 g/dL (ref 13.0–17.0)
Immature Granulocytes: 1 %
Lymphocytes Relative: 6 %
Lymphs Abs: 0.5 10*3/uL — ABNORMAL LOW (ref 0.7–4.0)
MCH: 28.6 pg (ref 26.0–34.0)
MCHC: 32.5 g/dL (ref 30.0–36.0)
MCV: 87.9 fL (ref 80.0–100.0)
Monocytes Absolute: 0.4 10*3/uL (ref 0.1–1.0)
Monocytes Relative: 5 %
Neutro Abs: 7.2 10*3/uL (ref 1.7–7.7)
Neutrophils Relative %: 88 %
Platelets: 236 10*3/uL (ref 150–400)
RBC: 5.11 MIL/uL (ref 4.22–5.81)
RDW: 13.7 % (ref 11.5–15.5)
WBC: 8.2 10*3/uL (ref 4.0–10.5)
nRBC: 0 % (ref 0.0–0.2)

## 2023-04-14 LAB — HEPATIC FUNCTION PANEL
ALT: 107 U/L — ABNORMAL HIGH (ref 0–44)
AST: 67 U/L — ABNORMAL HIGH (ref 15–41)
Albumin: 3.5 g/dL (ref 3.5–5.0)
Alkaline Phosphatase: 77 U/L (ref 38–126)
Bilirubin, Direct: 0.3 mg/dL — ABNORMAL HIGH (ref 0.0–0.2)
Indirect Bilirubin: 0.6 mg/dL (ref 0.3–0.9)
Total Bilirubin: 0.9 mg/dL (ref 0.0–1.2)
Total Protein: 6.2 g/dL — ABNORMAL LOW (ref 6.5–8.1)

## 2023-04-14 LAB — POCT I-STAT EG7
Acid-Base Excess: 5 mmol/L — ABNORMAL HIGH (ref 0.0–2.0)
Bicarbonate: 31.7 mmol/L — ABNORMAL HIGH (ref 20.0–28.0)
Calcium, Ion: 1.19 mmol/L (ref 1.15–1.40)
HCT: 45 % (ref 39.0–52.0)
Hemoglobin: 15.3 g/dL (ref 13.0–17.0)
O2 Saturation: 63 %
Potassium: 3.4 mmol/L — ABNORMAL LOW (ref 3.5–5.1)
Sodium: 134 mmol/L — ABNORMAL LOW (ref 135–145)
TCO2: 33 mmol/L — ABNORMAL HIGH (ref 22–32)
pCO2, Ven: 51.7 mmHg (ref 44–60)
pH, Ven: 7.395 (ref 7.25–7.43)
pO2, Ven: 33 mmHg (ref 32–45)

## 2023-04-14 LAB — BASIC METABOLIC PANEL WITH GFR
Anion gap: 11 (ref 5–15)
BUN: 28 mg/dL — ABNORMAL HIGH (ref 8–23)
CO2: 28 mmol/L (ref 22–32)
Calcium: 8.9 mg/dL (ref 8.9–10.3)
Chloride: 95 mmol/L — ABNORMAL LOW (ref 98–111)
Creatinine, Ser: 0.79 mg/dL (ref 0.61–1.24)
GFR, Estimated: >60 mL/min (ref >60)
Glucose, Bld: 134 mg/dL — ABNORMAL HIGH (ref 70–99)
Potassium: 3.1 mmol/L — ABNORMAL LOW (ref 3.5–5.1)
Sodium: 134 mmol/L — ABNORMAL LOW (ref 135–145)

## 2023-04-14 LAB — PHOSPHORUS: Phosphorus: 4.7 mg/dL — ABNORMAL HIGH (ref 2.5–4.6)

## 2023-04-14 LAB — MAGNESIUM: Magnesium: 2.2 mg/dL (ref 1.7–2.4)

## 2023-04-14 SURGERY — RIGHT HEART CATH

## 2023-04-14 MED ORDER — LIDOCAINE HCL (PF) 1 % IJ SOLN
INTRAMUSCULAR | Status: AC
Start: 2023-04-14 — End: ?
  Filled 2023-04-14: qty 30

## 2023-04-14 MED ORDER — METHYLPREDNISOLONE SODIUM SUCC 40 MG IJ SOLR
40.0000 mg | Freq: Two times a day (BID) | INTRAMUSCULAR | Status: DC
  Administered 2023-04-14 – 2023-04-15 (×2): 40 mg via INTRAVENOUS
  Filled 2023-04-14 (×2): qty 1

## 2023-04-14 MED ORDER — POTASSIUM CHLORIDE CRYS ER 20 MEQ PO TBCR
40.0000 meq | EXTENDED_RELEASE_TABLET | Freq: Once | ORAL | Status: AC
  Administered 2023-04-14: 40 meq via ORAL
  Filled 2023-04-14: qty 2

## 2023-04-14 MED ORDER — FUROSEMIDE 10 MG/ML IJ SOLN
40.0000 mg | Freq: Two times a day (BID) | INTRAMUSCULAR | Status: AC
  Administered 2023-04-14 – 2023-04-16 (×5): 40 mg via INTRAVENOUS
  Filled 2023-04-14 (×5): qty 4

## 2023-04-14 MED ORDER — LIDOCAINE HCL (PF) 1 % IJ SOLN
INTRAMUSCULAR | Status: DC | PRN
  Administered 2023-04-14: 5 mL

## 2023-04-14 MED ORDER — HEPARIN (PORCINE) IN NACL 1000-0.9 UT/500ML-% IV SOLN
INTRAVENOUS | Status: DC | PRN
  Administered 2023-04-14: 500 mL

## 2023-04-14 SURGICAL SUPPLY — 5 items
CATH SWAN GANZ 7F STRAIGHT (CATHETERS) IMPLANT
GLIDESHEATH SLENDER 7FR .021G (SHEATH) IMPLANT
SHEATH PROBE COVER 6X72 (BAG) IMPLANT
TRANSDUCER W/STOPCOCK (MISCELLANEOUS) IMPLANT
TUBING ART PRESS 72 MALE/FEM (TUBING) IMPLANT

## 2023-04-14 NOTE — Progress Notes (Incomplete)
 Rounding Note    Patient Name: Samuel Barnes Date of Encounter: 04/14/2023  [facility] Cardiologist: Armanda Magic, MD ***  Subjective   ***  Inpatient Medications    Scheduled Meds:  amiodarone  200 mg Oral Daily   amLODipine  5 mg Oral Daily   apixaban  5 mg Oral BID   busPIRone  5 mg Oral BID   citalopram  20 mg Oral Daily   empagliflozin  10 mg Oral Daily   furosemide  40 mg Intravenous Daily   losartan  25 mg Oral Daily   methylPREDNISolone (SOLU-MEDROL) injection  60 mg Intravenous Q12H   montelukast  10 mg Oral Daily   pravastatin  20 mg Oral q1800   terazosin  5 mg Oral Daily   Continuous Infusions:  PRN Meds: acetaminophen **OR** acetaminophen, albuterol, butalbital-acetaminophen-caffeine, melatonin, ondansetron (ZOFRAN) IV   Vital Signs    Vitals:   04/14/23 0942 04/14/23 0947 04/14/23 0952 04/14/23 0957  BP: (!) 169/88 (!) 175/86 (!) 169/87 (!) 171/83  Pulse: (!) 55 (!) 55 (!) 56 (!) 56  Resp: 15 12 18 14   Temp:      TempSrc:      SpO2: 99% 96% 95% 96%  Weight:      Height:        Intake/Output Summary (Last 24 hours) at 04/14/2023 1047 Last data filed at 04/14/2023 1020 Gross per 24 hour  Intake 880.25 ml  Output 2200 ml  Net -1319.75 ml      04/14/2023    4:54 AM 04/13/2023    8:15 PM 04/13/2023    4:39 AM  Last 3 Weights  Weight (lbs) 213 lb 1.6 oz 211 lb 6.7 oz 211 lb 8 oz  Weight (kg) 96.662 kg 95.9 kg 95.936 kg      Telemetry    *** - Personally Reviewed  ECG    *** - Personally Reviewed  Physical Exam  *** GEN: No acute distress.   Neck: No JVD Cardiac: RRR, no murmurs, rubs, or gallops.  Respiratory: Clear to auscultation bilaterally. GI: Soft, nontender, non-distended  MS: No edema; No deformity. Neuro:  Nonfocal  Psych: Normal affect   Labs    High Sensitivity Troponin:  No results for input(s): "TROPONINIHS" in the last 720 hours.   Chemistry Recent Labs  Lab 04/11/23 0530 04/12/23 0437  04/13/23 1555 04/14/23 0531  NA 133* 133* 133* 134*  K 4.3 4.0 3.4* 3.1*  CL 94* 95* 93* 95*  CO2 28 28 28 28   GLUCOSE 155* 180* 173* 134*  BUN 22 21 25* 28*  CREATININE 0.73 0.72 1.03 0.79  CALCIUM 9.0 8.7* 9.0 8.9  MG 2.2 2.0 2.1  --   PROT 6.7 5.9* 6.3*  --   ALBUMIN 3.8 3.4* 3.6  --   AST 42* 56* 67*  --   ALT 55* 60* 97*  --   ALKPHOS 69 70 77  --   BILITOT 0.8 0.8 1.0  --   GFRNONAA >60 >60 >60 >60  ANIONGAP 11 10 12 11     Lipids No results for input(s): "CHOL", "TRIG", "HDL", "LABVLDL", "LDLCALC", "CHOLHDL" in the last 168 hours.  Hematology Recent Labs  Lab 04/11/23 0530 04/12/23 0437 04/13/23 1555  WBC 6.6 7.3 7.9  RBC 4.33  4.35 4.24 4.99  HGB 12.4* 12.1* 14.3  HCT 38.0* 37.5* 43.7  MCV 87.8 88.4 87.6  MCH 28.6 28.5 28.7  MCHC 32.6 32.3 32.7  RDW 14.2 14.3 14.1  PLT 184 167 212   Thyroid  Recent Labs  Lab 04/09/23 2109  TSH 0.953    BNP Recent Labs  Lab 04/09/23 1413  BNP 775.4*    DDimer No results for input(s): "DDIMER" in the last 168 hours.   Radiology    No results found.  Cardiac Studies   ***  Patient Profile     73 y.o. male with PMH of chronic diastolic CHF, PAF, COPD, remote ICH 9/22, prior alcohol abuse who presented with worsening dyspnea. BNP  elevated 775.   Assessment & Plan    Acute on chronic diastolic CHF COPD PAF HTN  {Are we signing off today?:[id number]}  For questions or updates, please contact [facility] Please consult [email or web address] for contact info under        Signed, Azalee Course, PA  04/14/2023, 10:47 AM

## 2023-04-14 NOTE — Interval H&P Note (Signed)
 History and Physical Interval Note:  04/14/2023 9:25 AM  Samuel Barnes  has presented today for surgery, with the diagnosis of acute HFpEF.  The various methods of treatment have been discussed with the patient and family. After consideration of risks, benefits and other options for treatment, the patient has consented to  Procedure(s): RIGHT HEART CATH (N/A) as a surgical intervention.  The patient's history has been reviewed, patient examined, no change in status, stable for surgery.  I have reviewed the patient's chart and labs.  Questions were answered to the patient's satisfaction.     [name]

## 2023-04-14 NOTE — Telephone Encounter (Signed)
 Samuel Barnes patient has been  Admitted to [facility] on 04/09/2023.

## 2023-04-14 NOTE — Progress Notes (Signed)
 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 Patient endorses lightheadedness upon standing and walking. 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 Wife reports hearing patient wheeze at night.  In the ED, patient was hypoxic. Wheezing and 1+ BL LE edema present on exam. No JVD present. Patient given lasix 20mg  X1 and Mg 2g x1. CXR 4/2 showing atelectatic changes at lung bases and trace right pleural effusion, stable cardiomegaly. CXR on 4/5 showing worsening right pleural effusion and right lower lobe atelectasis or infiltrate. No edema. ECHO 4/3 showed LVEF 55-60% (unchanged from prior 2022 echo) with normal LV function. LV diastolic parameters consistent with Grade 3 restrictive diastolic function. RV systolic function is normal. RV mildly enlarged. MV abnormal with mild to moderate regurgitation. Tricuspid valve with moderate to severe regurgitation. Cardiology consulted. RHC 4/8 with RA 15, PA 52/30, PCWP 28, Fick CO/CI 4.5/2.1 - suggesting significant volume overload and preserved cardiac output.   Patient reports SOB and feelings of congestion when ambulating. No symptoms when laying down. Reports overall leg swelling improving since admission. Patient reports copay for jardiance of $47 is not affordable. Patient is interested in the possibility of attaining a grant. Patient is interested in obtaining discharge medications from [facility] pharmacy and [facility] mail order pharmacy with automatic refill.    Current HF Medications: Diuretic: IV lasix 40mg  BID ACE/ARB/ARNI: Losartan 25mg  daily SGLT2i: jardiance 10mg   daily Other: s/p Kcl 40x1  Prior to admission HF Medications: Diuretic: lasix 20mg  PRN Beta blocker: Metoprolol succinate 25mg  daily ACE/ARB/ARNI: Losartan 25mg  daily  Pertinent Lab Values: Serum creatinine 0.79, BUN 28, Potassium 3.1, Sodium 134 (BL 130s), BNP 775.4, Magnesium 2.1, A1c 5.9 (2022)  Vital Signs: Weight: 213 lbs (admission weight: 221 lbs) Blood pressure: 130-140s/60-70s  Heart rate: 48-70s  I/O: net -1.38L yesterday; net -8.72L since admission 2L [place] > RA  Medication Assistance / Insurance Benefits Check: Does the patient have prescription insurance?  Yes Type of insurance plan: Medicare  Does the patient qualify for medication assistance through manufacturers or grants?   Yes Eligible grants and/or patient assistance programs: [employer] Jardiance Approved medication assistance renewals will be completed by: [facility]  Outpatient Pharmacy:  Prior to admission outpatient pharmacy: [facility] mail order pharmacy  Is the patient willing to use [facility] pharmacy at discharge? Yes Is the patient willing to transition their outpatient pharmacy to utilize a [facility] outpatient pharmacy?   Yes    Assessment: 1. Acute on chronic diastolic CHF (LVEF 55-60%), due to unknown etiology. NYHA class 2 symptoms.Strict I/Os and daily weights. Keep K>4 and Mg>2. No LE edema; JVD present on exam per cards. RHC results reveal significant fluid overload.  - Agree with holding PTA Metoprolol given volume overloaded and low HR  - Continue jardiance 10mg  daily  - Continue Losartan 25mg  daily    Plan: 1) Medication changes recommended at this time: - Monitor K after replacement  - Agree with increasing IV lasix, with plans for transition to po once euvolemic    2) Patient assistance: - Patient reports copay for jardiance of $47 is not  affordable  - Patient meets criteria for jardiance grant and has been approved (copay will be $0 - information provided to [facility] pharmacy system)  3)   Education  - Patient has been educated on current HF medications and potential additions to HF medication regimen - Patient verbalizes understanding that over the next few months, these medication doses may change and more medications may be added to optimize HF regimen - Patient has been educated on basic disease state pathophysiology and goals of therapy   Verdene Rio, PharmD PGY1 Pharmacy Resident

## 2023-04-14 NOTE — Progress Notes (Signed)
 DAILY PROGRESS NOTE   Patient Name: Samuel Barnes Date of Encounter: 04/14/2023 Cardiologist: Armanda Magic, MD  Chief Complaint   Breathing better  Patient Profile   THURL BOEN is a 73 y.o. 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 Marland Mcalpine.   Subjective   Net negative 1.3L overnight- now 8.7L negative. RHC today shows the following: RA 15, RV 52/15, PA 52/30, PCWP 28. Fick CO/CI 4.5/2.1, thermodilution 5.7/2.7, suggesting that he remains significantly volume overloaded.  Objective   Vitals:   04/14/23 0942 04/14/23 0947 04/14/23 0952 04/14/23 0957  BP: (!) 169/88 (!) 175/86 (!) 169/87 (!) 171/83  Pulse: (!) 55 (!) 55 (!) 56 (!) 56  Resp: 15 12 18 14   Temp:      TempSrc:      SpO2: 99% 96% 95% 96%  Weight:      Height:        Intake/Output Summary (Last 24 hours) at 04/14/2023 1107 Last data filed at 04/14/2023 1020 Gross per 24 hour  Intake 880.25 ml  Output 2200 ml  Net -1319.75 ml   Filed Weights   04/13/23 0439 04/13/23 2015 04/14/23 0454  Weight: 95.9 kg 95.9 kg 96.7 kg    Physical Exam   General appearance: alert and no distress Neck: JVD - 1 cm above sternal notch, no carotid bruit, and thyroid not enlarged, symmetric, no tenderness/mass/nodules Lungs: diminished breath sounds bibasilar Heart: regular bradycardia Extremities: extremities normal, atraumatic, no cyanosis or edema Neurologic: Grossly normal  Inpatient Medications    Scheduled Meds:  amiodarone  200 mg Oral Daily   amLODipine  5 mg Oral Daily   apixaban  5 mg Oral BID   busPIRone  5 mg Oral BID   citalopram  20 mg Oral Daily   empagliflozin  10 mg Oral Daily   furosemide  40 mg Intravenous Daily   losartan  25 mg Oral Daily   methylPREDNISolone (SOLU-MEDROL) injection  60 mg  Intravenous Q12H   montelukast  10 mg Oral Daily   pravastatin  20 mg Oral q1800   terazosin  5 mg Oral Daily    Continuous Infusions:    PRN Meds: acetaminophen **OR** acetaminophen, albuterol, butalbital-acetaminophen-caffeine, melatonin, ondansetron (ZOFRAN) IV   Labs   Results for orders placed or performed during the hospital encounter of 04/09/23 (from the past 48 hours)  CBC with Differential/Platelet     Status: Abnormal   Collection Time: 04/13/23  3:55 PM  Result Value Ref Range   WBC 7.9 4.0 - 10.5 K/uL   RBC 4.99 4.22 - 5.81 MIL/uL   Hemoglobin 14.3 13.0 - 17.0 g/dL   HCT 40.9 81.1 - 91.4 %   MCV 87.6 80.0 - 100.0 fL   MCH 28.7 26.0 - 34.0 pg   MCHC 32.7 30.0 - 36.0 g/dL   RDW 78.2 95.6 - 21.3 %   Platelets 212 150 - 400 K/uL   nRBC 0.0 0.0 - 0.2 %   Neutrophils Relative % 89 %   Neutro Abs 7.0 1.7 - 7.7 K/uL   Lymphocytes Relative 5 %   Lymphs Abs 0.4 (L) 0.7 - 4.0 K/uL   Monocytes Relative 5 %   Monocytes Absolute 0.4 0.1 - 1.0 K/uL   Eosinophils Relative 0 %   Eosinophils Absolute 0.0  0.0 - 0.5 K/uL   Basophils Relative 0 %   Basophils Absolute 0.0 0.0 - 0.1 K/uL   Immature Granulocytes 1 %   Abs Immature Granulocytes 0.06 0.00 - 0.07 K/uL    Comment: Performed at [facility], [address] [place], [place] [address]  Comprehensive metabolic panel with GFR     Status: Abnormal   Collection Time: 04/13/23  3:55 PM  Result Value Ref Range   Sodium 133 (L) 135 - 145 mmol/L   Potassium 3.4 (L) 3.5 - 5.1 mmol/L   Chloride 93 (L) 98 - 111 mmol/L   CO2 28 22 - 32 mmol/L   Glucose, Bld 173 (H) 70 - 99 mg/dL    Comment: Glucose reference range applies only to samples taken after fasting for at least 8 hours.   BUN 25 (H) 8 - 23 mg/dL   Creatinine, Ser 8.29 0.61 - 1.24 mg/dL   Calcium 9.0 8.9 - 56.2 mg/dL   Total Protein 6.3 (L) 6.5 - 8.1 g/dL   Albumin 3.6 3.5 - 5.0 g/dL   AST 67 (H) 15 - 41 U/L   ALT 97 (H) 0 - 44 U/L   Alkaline Phosphatase 77  38 - 126 U/L   Total Bilirubin 1.0 0.0 - 1.2 mg/dL   GFR, Estimated >13 >08 mL/min    Comment: (NOTE) Calculated using the CKD-EPI Creatinine Equation (2021)    Anion gap 12 5 - 15    Comment: Performed at [facility], [address] [place], [place] [address]  Phosphorus     Status: None   Collection Time: 04/13/23  3:55 PM  Result Value Ref Range   Phosphorus 4.3 2.5 - 4.6 mg/dL    Comment: Performed at [facility], [address], [place], [place] [address]  Magnesium     Status: None   Collection Time: 04/13/23  3:55 PM  Result Value Ref Range   Magnesium 2.1 1.7 - 2.4 mg/dL    Comment: Performed at [facility], [address] [place], [place] [address]  Basic metabolic panel     Status: Abnormal   Collection Time: 04/14/23  5:31 AM  Result Value Ref Range   Sodium 134 (L) 135 - 145 mmol/L   Potassium 3.1 (L) 3.5 - 5.1 mmol/L   Chloride 95 (L) 98 - 111 mmol/L   CO2 28 22 - 32 mmol/L   Glucose, Bld 134 (H) 70 - 99 mg/dL    Comment: Glucose reference range applies only to samples taken after fasting for at least 8 hours.   BUN 28 (H) 8 - 23 mg/dL   Creatinine, Ser 3.24 0.61 - 1.24 mg/dL   Calcium 8.9 8.9 - 40.1 mg/dL   GFR, Estimated >02 >72 mL/min    Comment: (NOTE) Calculated using the CKD-EPI Creatinine Equation (2021)    Anion gap 11 5 - 15    Comment: Performed at [facility], [address] [place], [place] [address]    ECG   N/A  Telemetry   Sinus bradycardia - Personally Reviewed  Radiology    No results found.   Cardiac Studies   N/A  Assessment   Principal Problem:   Acute on chronic diastolic CHF (congestive heart failure) (HCC) Active Problems:   HLD (hyperlipidemia)   BPH (benign prostatic hyperplasia)   Essential hypertension   Chronic hyponatremia   Paroxysmal atrial fibrillation (HCC)   SOB (shortness of breath)   COPD  with acute exacerbation (HCC)   GAD (generalized anxiety disorder)   Plan    Creatinine back to baseline today - sodium 134 (up from 130 on admission), remains volume up.  Will increase lasix back to 40 mg IV BID. Monitor output and renal function with diuresis.  Time Spent Directly with Patient:  I have spent a total of 25 minutes with the patient reviewing hospital notes, telemetry, EKGs, labs and examining the patient as well as establishing an assessment and plan that was discussed personally with the patient.  > 50% of time was spent in direct patient care.  Length of Stay:   LOS: 5 days   Chrystie Nose, MD, [facility], FACP  [place]  [facility]  Medical Director of the Advanced Lipid Disorders &  Cardiovascular Risk Reduction Clinic Diplomate of the American Board of Clinical Lipidology Attending Cardiologist  Direct Dial: [phone]  Fax: [phone]  Website:  [email or web address] Nicely Tyger Oka 04/14/2023, 11:07 AM

## 2023-04-14 NOTE — Telephone Encounter (Signed)
 Pharmacy Patient Advocate Encounter  Insurance verification completed.    The patient is insured through [facility]. Patient has Medicare and is not eligible for a copay card, but may be able to apply for patient assistance or Medicare RX Payment Plan (Patient Must reach out to their plan, if eligible for payment plan), if available.    Ran test claim for JARDIANCE 10MG  and the current 30 day co-pay is $47.00.  Ran test claim for FARXIGA 10MG  and the current 30 day co-pay is $47.00.   This test claim was processed through [facility]- copay amounts may vary at other pharmacies due to pharmacy/plan contracts, or as the patient moves through the different stages of their insurance plan.

## 2023-04-14 NOTE — Progress Notes (Signed)
 PROGRESS NOTE    Samuel Barnes [facility]  ZHY:[id number] DOB: 1950-03-21 DOA: 04/09/2023 PCP: [name], PA   Brief Narrative:  The patient is a 73 year old Caucasian male with a past medical history significant for but not limited to paroxysmal atrial fibrillation on anticoagulation, chronic diastolic CHF, history of intracranial hemorrhage and September 2022, BPH, chronic hyponatremia, essential hypertension, hyperlipidemia, who presented from [facility] ED for acute shortness of breath that has been worsening over the last several weeks.  Being admitted and treated for acute respiratory failure with hypoxia in the setting of an acute exacerbation of COPD and acute exacerbation on chronic diastolic CHF. Cardiology evaluating and are in a right heart catheterization to evaluate his volume status and degree of diastolic dysfunction.  Assessment and Plan:  Acute Exacerbation of Diastolic CHF / Severe TR / Mild to Moderate MR / Pulmonary HTN: BNP elevated at 775.4 on Admission. ECHO this visit shows EF of 55-60% Grade 3 DD. Diuresis with IV Lasix 40 mg BID reduced to IV 40 mg Daily on 04/13/23 but now titrated back up to 40 mg BID per Cardiology given continued Volume Overload. Strict Is and Os and Daily Weights. Repeat CXR as below.  -Holding his beta-blocker with Metoprolol Succinate 25 mg p.o. daily given his bradycardia; Cardiology initiated SGLT2 inhibitor with Empagliflozin 10 mg p.o. daily.  Plan is for RHC and underwent it this AM and it showed he was still volume overloaded as it showed: RA 15, RV 52/15, PA 52/30, PCWP 28. Fick CO/CI 4.5/2.1, thermodilution 5.7/2.7. -Further Care per Cardiology   Acute Exacerbation of COPD: C/w Steroids with Solumedrol and started weaning from IV Methylprednisolone 80 mg BID to 60 mg BID and now is on 40 mg IV BID. Completed IV Azithromycin x 5 Days. Flutter Valve and Incentive Spirometry. DuoNeb q6h now stopped. C/w Montelukast 10 mg po Daily C/w  Albuterol Neb 2.5 mg q4hprn. CXR done and showed Bibasilar ATX as well as a Small R sided Pleural Effusion; Last CXR done day before yesterday and showed "Worsening right pleural effusion and right lower lobe atelectasis or infiltrate." Repeat CXR in the AM   Acute Respiratory Failure with Hypoxia: In the setting of Above. C/w Diuresis and Treatment. Wean O2 as tolerated as he does not wear oxygen at home. SpO2: 99 % O2 Flow Rate (L/min): 2 L/min; Will need an Ambulatory Home O2 Screen prior to D/C; was able to be weaned off of O2  PAF: Has a CHA2DS2-VASc of 3. Metoprolol Succinate held. C/w Amiodarone 200 mg po Daily. C/w Anticoagulation with Apixaban 5 mg po BID. ECHOCardiogram as above; Cardiology is following and appreciate further management and patient underwent RHC this AM   Essential HTN: C/w Amlodipine 5 mg po Daily; Holding Metoprolol Succinate 12.5 mg po Daily given Bradycardia. C/w Terazosin 5 mg po daily. CTM BP per Protocol. Last BP Reading was 167/80  HLD: C/w Lovastatin substitution for Pravastatin 20 mg po Daily  HypoNa+: Likely 2/2 to Hypervolemia but has chronic hypoosmolar hyponatremia. B/L ranging from 130-134. C/w Diuresis. Na+ went from 132 -> 130 -> 134. CTM and Trend and repeat CMP in the AM   BPH: C/w Terazosin 5 mg po Daily   Normocytic Anemia: Hgb/Hct went from 12.7/39.8 -> 12.4/37.6 -> 12.4/38.0 -> 12.1/37.5 -> 14.3/43.7 -> 14.6/44.9. Anemia Panel showed an iron level of 35, UIBC 351, TIBC 386, saturation ration 9%, ferritin level 38, folate 20.6 and a vitamin B12 level of 955 CTM for S/Sx  of Bleeding; No overt bleeding noted. Repeat CBC in the AM   Generalized Anxiety Disorder: C/w Buspirone 5 mg po BID and Citalopram 20 mg po Daily   Abnormal LFTs: Mild and likely in the setting of Volume Overload but is worsening. AST is now 45 -> 42 -> 56 -> 67 -> 67 and ALT is now 53 -> 55 -> 60 -> 97 -> 107. CTM and Trend and will obtain a right upper quadrant ultrasound as well  as an acute hepatitis panel in the AM. Repeat CMP in AM.  Hypoalbuminemia: Patient's Albumin level went from 3.7 -> 3.8 -> 3.4 -> 3.6 -> 3.5. CTM and Trend and repeat CMP in the AM   Class I Obesity: Complicates overall prognosis and care. Estimated body mass index is 31.47 kg/m as calculated from the following:   Height as of this encounter: 5\' 9"  (1.753 m).   Weight as of this encounter: 96.7 kg. Weight Loss and Dietary Counseling given   DVT prophylaxis:  apixaban (ELIQUIS) tablet 5 mg    Code Status: Full Code Family Communication: D/w Family present at bedside   Disposition Plan:  Level of care: Telemetry Cardiac Status is: Inpatient Remains inpatient appropriate because: Is being continued on diuresis per cardiology   Consultants:  Cardiology  Procedures:  As delineated as above  Antimicrobials:  Anti-infectives (From admission, onward)    Start     Dose/Rate Route Frequency Ordered Stop   04/09/23 2200  azithromycin (ZITHROMAX) 500 mg in sodium chloride 0.9 % 250 mL IVPB       Note to Pharmacy: (In setting of acute copd exac)   500 mg 250 mL/hr over 60 Minutes Intravenous Every 24 hours 04/09/23 2053 04/13/23 2230       Subjective: Seen and examined at bedside after his cardiac catheterization and he is doing fairly okay.  Denied any nausea or vomiting.  Understands that his diuretics have been increased again.  No nausea or vomiting.  Denies any other concerns or complaints at this time.  Objective: Vitals:   04/14/23 0947 04/14/23 0952 04/14/23 0957 04/14/23 1018  BP: (!) 175/86 (!) 169/87 (!) 171/83 (!) 167/80  Pulse: (!) 55 (!) 56 (!) 56 (!) 55  Resp: 12 18 14    Temp:      TempSrc:      SpO2: 96% 95% 96% 99%  Weight:      Height:        Intake/Output Summary (Last 24 hours) at 04/14/2023 1606 Last data filed at 04/14/2023 1513 Gross per 24 hour  Intake 520.25 ml  Output 2625 ml  Net -2104.75 ml   Filed Weights   04/13/23 0439 04/13/23 2015 04/14/23  0454  Weight: 95.9 kg 95.9 kg 96.7 kg   Examination: Physical Exam:  Constitutional: WN/WD obese Caucasian male who appears calm and in no acute distress Respiratory: Diminished to auscultation bilaterally but does have some crackles and slight rhonchi.  No appreciable wheezing or rales. Normal respiratory effort and patient is not tachypenic. No accessory muscle use.  Unlabored breathing Cardiovascular: RRR, no murmurs / rubs / gallops. S1 and S2 auscultated.  No real appreciable extremity edema Abdomen: Soft, non-tender, distended secondary to body habitus. Bowel sounds positive.  GU: Deferred. Musculoskeletal: No clubbing / cyanosis of digits/nails. No joint deformity upper and lower extremities.  Skin: No rashes, lesions, ulcers on limited skin evaluation. No induration; Warm and dry.  Neurologic: CN 2-12 grossly intact with no focal deficits. Romberg sign and cerebellar  reflexes not assessed.  Psychiatric: Normal judgment and insight. Alert and oriented x 3. Normal mood and appropriate affect.   Data Reviewed: I have personally reviewed following labs and imaging studies  CBC: Recent Labs  Lab 04/10/23 0531 04/11/23 0530 04/12/23 0437 04/13/23 1555 04/14/23 1038  WBC 4.3 6.6 7.3 7.9 8.2  NEUTROABS 3.7 6.1 6.8 7.0 7.2  HGB 12.4* 12.4* 12.1* 14.3 14.6  HCT 37.6* 38.0* 37.5* 43.7 44.9  MCV 87.2 87.8 88.4 87.6 87.9  PLT 158 184 167 212 236   Basic Metabolic Panel: Recent Labs  Lab 04/10/23 0531 04/11/23 0530 04/12/23 0437 04/13/23 1555 04/14/23 0531 04/14/23 1038  NA 130* 133* 133* 133* 134*  --   K 4.2 4.3 4.0 3.4* 3.1*  --   CL 94* 94* 95* 93* 95*  --   CO2 26 28 28 28 28   --   GLUCOSE 132* 155* 180* 173* 134*  --   BUN 16 22 21  25* 28*  --   CREATININE 0.62 0.73 0.72 1.03 0.79  --   CALCIUM 8.7* 9.0 8.7* 9.0 8.9  --   MG 2.1 2.2 2.0 2.1  --  2.2  PHOS 2.9 3.4 3.7 4.3  --  4.7*   GFR: Estimated Creatinine Clearance: 94.3 mL/min (by C-G formula based on SCr of  0.79 mg/dL). Liver Function Tests: Recent Labs  Lab 04/10/23 0531 04/11/23 0530 04/12/23 0437 04/13/23 1555 04/14/23 1038  AST 45* 42* 56* 67* 67*  ALT 53* 55* 60* 97* 107*  ALKPHOS 74 69 70 77 77  BILITOT 1.0 0.8 0.8 1.0 0.9  PROT 6.5 6.7 5.9* 6.3* 6.2*  ALBUMIN 3.7 3.8 3.4* 3.6 3.5   No results for input(s): "LIPASE", "AMYLASE" in the last 168 hours. No results for input(s): "AMMONIA" in the last 168 hours. Coagulation Profile: No results for input(s): "INR", "PROTIME" in the last 168 hours. Cardiac Enzymes: No results for input(s): "CKTOTAL", "CKMB", "CKMBINDEX", "TROPONINI" in the last 168 hours. BNP (last 3 results) No results for input(s): "PROBNP" in the last 8760 hours. HbA1C: No results for input(s): "HGBA1C" in the last 72 hours. CBG: Recent Labs  Lab 04/09/23 1414  GLUCAP 148*   Lipid Profile: No results for input(s): "CHOL", "HDL", "LDLCALC", "TRIG", "CHOLHDL", "LDLDIRECT" in the last 72 hours. Thyroid Function Tests: No results for input(s): "TSH", "T4TOTAL", "FREET4", "T3FREE", "THYROIDAB" in the last 72 hours. Anemia Panel: No results for input(s): "VITAMINB12", "FOLATE", "FERRITIN", "TIBC", "IRON", "RETICCTPCT" in the last 72 hours. Sepsis Labs: Recent Labs  Lab 04/10/23 0531  PROCALCITON <0.10   Recent Results (from the past 240 hours)  Resp panel by RT-PCR (RSV, Flu A&B, Covid) Anterior Nasal Swab     Status: None   Collection Time: 04/09/23  2:13 PM   Specimen: Anterior Nasal Swab  Result Value Ref Range Status   SARS Coronavirus 2 by RT PCR NEGATIVE NEGATIVE Final    Comment: (NOTE) SARS-CoV-2 target nucleic acids are NOT DETECTED.  The SARS-CoV-2 RNA is generally detectable in upper respiratory specimens during the acute phase of infection. The lowest concentration of SARS-CoV-2 viral copies this assay can detect is 138 copies/mL. A negative result does not preclude SARS-Cov-2 infection and should not be used as the sole basis for  treatment or other patient management decisions. A negative result may occur with  improper specimen collection/handling, submission of specimen other than nasopharyngeal swab, presence of viral mutation(s) within the areas targeted by this assay, and inadequate number of viral copies(<138 copies/mL). A  negative result must be combined with clinical observations, patient history, and epidemiological information. The expected result is Negative.  Fact Sheet for Patients:  BloggerCourse.com  Fact Sheet for Healthcare Providers:  SeriousBroker.it  This test is no t yet approved or cleared by the [place] FDA and  has been authorized for detection and/or diagnosis of SARS-CoV-2 by FDA under an Emergency Use Authorization (EUA). This EUA will remain  in effect (meaning this test can be used) for the duration of the COVID-19 declaration under Section 564(b)(1) of the Act, 21 U.S.C.section 360bbb-3(b)(1), unless the authorization is terminated  or revoked sooner.       Influenza A by PCR NEGATIVE NEGATIVE Final   Influenza B by PCR NEGATIVE NEGATIVE Final    Comment: (NOTE) The Xpert Xpress SARS-CoV-2/FLU/RSV plus assay is intended as an aid in the diagnosis of influenza from Nasopharyngeal swab specimens and should not be used as a sole basis for treatment. Nasal washings and aspirates are unacceptable for Xpert Xpress SARS-CoV-2/FLU/RSV testing.  Fact Sheet for Patients: BloggerCourse.com  Fact Sheet for Healthcare Providers: SeriousBroker.it  This test is not yet approved or cleared by the [place] FDA and has been authorized for detection and/or diagnosis of SARS-CoV-2 by FDA under an Emergency Use Authorization (EUA). This EUA will remain in effect (meaning this test can be used) for the duration of the COVID-19 declaration under Section 564(b)(1) of the Act, 21  U.S.C. section 360bbb-3(b)(1), unless the authorization is terminated or revoked.     Resp Syncytial Virus by PCR NEGATIVE NEGATIVE Final    Comment: (NOTE) Fact Sheet for Patients: BloggerCourse.com  Fact Sheet for Healthcare Providers: SeriousBroker.it  This test is not yet approved or cleared by the [place] FDA and has been authorized for detection and/or diagnosis of SARS-CoV-2 by FDA under an Emergency Use Authorization (EUA). This EUA will remain in effect (meaning this test can be used) for the duration of the COVID-19 declaration under Section 564(b)(1) of the Act, 21 U.S.C. section 360bbb-3(b)(1), unless the authorization is terminated or revoked.  Performed at [facility], [address], [place], [place] [address]     Radiology Studies: CARDIAC CATHETERIZATION Result Date: 04/14/2023 Conclusions: Moderately elevated left heart and pulmonary artery pressures. Severely elevated right heart filling pressures. Normal to mildly reduced cardiac output/index. Recommendations: Continue diuresis. Yvonne Kendall, MD [facility]  Scheduled Meds:  amiodarone  200 mg Oral Daily   amLODipine  5 mg Oral Daily   apixaban  5 mg Oral BID   busPIRone  5 mg Oral BID   citalopram  20 mg Oral Daily   empagliflozin  10 mg Oral Daily   furosemide  40 mg Intravenous BID   losartan  25 mg Oral Daily   methylPREDNISolone (SOLU-MEDROL) injection  40 mg Intravenous Q12H   montelukast  10 mg Oral Daily   pravastatin  20 mg Oral q1800   terazosin  5 mg Oral Daily   Continuous Infusions:   LOS: 5 days   Marguerita Merles, DO [facility] Available via Epic secure chat 7am-7pm After these hours, please refer to coverage provider listed on [email or web address] 04/14/2023, 4:06 PM

## 2023-04-14 NOTE — Telephone Encounter (Signed)
 Patient Advocate Encounter   The patient was approved for a [employer] grant that will help cover the cost of JARDIANCE Total amount awarded, $10,000.  Effective: 03/15/23 - 03/13/24   [id number] [id number]  Pharmacy provided with grant info. Patient will get scripts mailed to him from [facility]. Nursing will assist with sending in scripts at discharge.   Haze Rushing, CPhT  Pharmacy Patient Advocate Specialist  Direct Number: [phone] Fax: [phone]

## 2023-04-14 NOTE — TOC Initial Note (Signed)
 Transition of Care [facility]) - Initial/Assessment Note    Patient Details  Name: Samuel Barnes MRN: [id number] Date of Birth: Jul 21, 1950  Transition of Care [facility]) CM/SW Contact:    Gala Lewandowsky, RN Phone Number: 04/14/2023, 1:05 PM  Clinical Narrative: Patient presented for shortness of breath-plan for RHC. Patient is from home with support of daughter who is in. Patient has DME cane, rolling walker, and shower chair.  Daughter takes patient to all appointments and he does not have any issues obtaining medications.                Expected Discharge Plan: Home/Self Care Barriers to Discharge: No Barriers Identified   Patient Goals and CMS Choice Patient states their goals for this hospitalization and ongoing recovery are:: plan to return home  Expected Discharge Plan and Services   Discharge Planning Services: CM Consult Post Acute Care Choice: NA Living arrangements for the past 2 months: Single Family Home  Prior Living Arrangements/Services Living arrangements for the past 2 months: Single Family Home Lives with:: Adult Children Patient language and need for interpreter reviewed:: Yes Do you feel safe going back to the place where you live?: Yes      Need for Family Participation in Patient Care: No (Comment) Care giver support system in place?: No (comment)   Criminal Activity/Legal Involvement Pertinent to Current Situation/Hospitalization: No - Comment as needed  Activities of Daily Living   ADL Screening (condition at time of admission) Independently performs ADLs?: Yes (appropriate for developmental age) Is the patient deaf or have difficulty hearing?: No Does the patient have difficulty seeing, even when wearing glasses/contacts?: No Does the patient have difficulty concentrating, remembering, or making decisions?: No  Permission Sought/Granted Permission sought to share information with : Family Supports, Case Production designer, theatre/television/film, Optician, dispensing                Emotional Assessment Appearance:: Appears stated age Attitude/Demeanor/Rapport: Engaged Affect (typically observed): Appropriate Orientation: : Oriented to Self, Oriented to Place, Oriented to  Time, Oriented to Situation Alcohol / Substance Use: Not Applicable Psych Involvement: No (comment)  Admission diagnosis:  Acute exacerbation of congestive heart failure (HCC) [I50.9] Acute respiratory failure with hypoxia (HCC) [J96.01] Nonintractable headache, unspecified chronicity pattern, unspecified headache type [R51.9] Acute on chronic congestive heart failure, unspecified heart failure type [facility]) [I50.9] Patient Active Problem List   Diagnosis Date Noted   Acute exacerbation of congestive heart failure (HCC) 04/09/2023   Acute on chronic diastolic CHF (congestive heart failure) (HCC) 04/09/2023   SOB (shortness of breath) 04/09/2023   COPD with acute exacerbation (HCC) 04/09/2023   GAD (generalized anxiety disorder) 04/09/2023   Carotid artery stenosis, asymptomatic, bilateral 09/06/2021   Secondary hypercoagulable state (HCC) 01/30/2021   S/P craniotomy 12/26/2020   Cerebrovascular malformation, 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   Secondary corneal edema of left eye 11/23/2020   Secondary glaucoma, left, moderate stage 11/23/2020   Intracranial bleed (HCC) 09/27/2020   Acute metabolic encephalopathy 08/16/2020   Syncope 08/14/2020   Hyperglycemia 07/29/2020   Persistent atrial fibrillation (HCC)    Acute congestive heart failure (HCC)    Acute respiratory failure with hypoxia (HCC) 07/28/2020   Pericardial effusion 07/28/2020   Aortic atherosclerosis (HCC) 07/28/2020   Coronary artery calcification 07/28/2020   Class 2 obesity 07/28/2020   HLD (hyperlipidemia) 07/28/2020   Tobacco use 07/28/2020   Prolonged QT interval 07/28/2020   Hypocalcemia 07/28/2020   Alcohol abuse 07/28/2020   Fatty liver 07/28/2020  BPH (benign prostatic hyperplasia)     Depression    Essential hypertension    Chronic hyponatremia    Hypomagnesemia    Paroxysmal atrial fibrillation (HCC)    PCP:  [name], PA Pharmacy:   [id number] - [place], [place] - [address] [place] [address] Phone: [phone] Fax: [phone]  [facility] [place] [facility] [address], [address] [place] [address] Phone: [phone] Fax: [phone]  [facility] - [place], [place] - [address] [place] [address] Phone: [phone] Fax: [phone]  [facility] [place], [place] - [address] [place] [address] Phone: [phone] Fax: [phone]  [facility] [address] [place] [address] Phone: [phone] Fax: [phone]     Social Drivers of Health (SDOH) Social History: SDOH Screenings   Food Insecurity: No Food Insecurity (04/09/2023)  Housing: Unknown (04/09/2023)  Transportation Needs: Unknown (04/09/2023)  Utilities: Not At Risk (04/09/2023)  Social Connections: Unknown (04/09/2023)  Tobacco Use: Medium Risk (04/09/2023)   SDOH Interventions:     Readmission Risk Interventions     No data to display

## 2023-04-15 ENCOUNTER — Other Ambulatory Visit (HOSPITAL_COMMUNITY): Payer: Self-pay

## 2023-04-15 ENCOUNTER — Inpatient Hospital Stay (HOSPITAL_COMMUNITY)

## 2023-04-15 DIAGNOSIS — J441 Chronic obstructive pulmonary disease with (acute) exacerbation: Secondary | ICD-10-CM | POA: Diagnosis not present

## 2023-04-15 DIAGNOSIS — N400 Enlarged prostate without lower urinary tract symptoms: Secondary | ICD-10-CM | POA: Diagnosis not present

## 2023-04-15 DIAGNOSIS — E871 Hypo-osmolality and hyponatremia: Secondary | ICD-10-CM | POA: Diagnosis not present

## 2023-04-15 DIAGNOSIS — I5033 Acute on chronic diastolic (congestive) heart failure: Secondary | ICD-10-CM | POA: Diagnosis not present

## 2023-04-15 LAB — CBC WITH DIFFERENTIAL/PLATELET
Abs Immature Granulocytes: 0.09 10*3/uL — ABNORMAL HIGH (ref 0.00–0.07)
Basophils Absolute: 0 10*3/uL (ref 0.0–0.1)
Basophils Relative: 0 %
Eosinophils Absolute: 0 10*3/uL (ref 0.0–0.5)
Eosinophils Relative: 0 %
HCT: 43.2 % (ref 39.0–52.0)
Hemoglobin: 14.2 g/dL (ref 13.0–17.0)
Immature Granulocytes: 1 %
Lymphocytes Relative: 4 %
Lymphs Abs: 0.4 10*3/uL — ABNORMAL LOW (ref 0.7–4.0)
MCH: 28.7 pg (ref 26.0–34.0)
MCHC: 32.9 g/dL (ref 30.0–36.0)
MCV: 87.3 fL (ref 80.0–100.0)
Monocytes Absolute: 0.6 10*3/uL (ref 0.1–1.0)
Monocytes Relative: 6 %
Neutro Abs: 8.4 10*3/uL — ABNORMAL HIGH (ref 1.7–7.7)
Neutrophils Relative %: 89 %
Platelets: 216 10*3/uL (ref 150–400)
RBC: 4.95 MIL/uL (ref 4.22–5.81)
RDW: 14 % (ref 11.5–15.5)
WBC: 9.5 10*3/uL (ref 4.0–10.5)
nRBC: 0 % (ref 0.0–0.2)

## 2023-04-15 LAB — COMPREHENSIVE METABOLIC PANEL WITH GFR
ALT: 105 U/L — ABNORMAL HIGH (ref 0–44)
AST: 57 U/L — ABNORMAL HIGH (ref 15–41)
Albumin: 3.3 g/dL — ABNORMAL LOW (ref 3.5–5.0)
Alkaline Phosphatase: 76 U/L (ref 38–126)
Anion gap: 10 (ref 5–15)
BUN: 31 mg/dL — ABNORMAL HIGH (ref 8–23)
CO2: 28 mmol/L (ref 22–32)
Calcium: 8.8 mg/dL — ABNORMAL LOW (ref 8.9–10.3)
Chloride: 96 mmol/L — ABNORMAL LOW (ref 98–111)
Creatinine, Ser: 0.82 mg/dL (ref 0.61–1.24)
GFR, Estimated: >60 mL/min (ref >60)
Glucose, Bld: 163 mg/dL — ABNORMAL HIGH (ref 70–99)
Potassium: 3.3 mmol/L — ABNORMAL LOW (ref 3.5–5.1)
Sodium: 134 mmol/L — ABNORMAL LOW (ref 135–145)
Total Bilirubin: 0.9 mg/dL (ref 0.0–1.2)
Total Protein: 5.8 g/dL — ABNORMAL LOW (ref 6.5–8.1)

## 2023-04-15 LAB — MAGNESIUM: Magnesium: 2.2 mg/dL (ref 1.7–2.4)

## 2023-04-15 LAB — PHOSPHORUS: Phosphorus: 4.7 mg/dL — ABNORMAL HIGH (ref 2.5–4.6)

## 2023-04-15 MED ORDER — METHYLPREDNISOLONE SODIUM SUCC 40 MG IJ SOLR
40.0000 mg | INTRAMUSCULAR | Status: DC
  Administered 2023-04-16: 40 mg via INTRAVENOUS
  Filled 2023-04-15: qty 1

## 2023-04-15 MED ORDER — POTASSIUM CHLORIDE CRYS ER 20 MEQ PO TBCR
40.0000 meq | EXTENDED_RELEASE_TABLET | Freq: Two times a day (BID) | ORAL | Status: DC
  Administered 2023-04-15 – 2023-04-16 (×4): 40 meq via ORAL
  Filled 2023-04-15 (×4): qty 2

## 2023-04-15 MED ORDER — SIMETHICONE 80 MG PO CHEW
80.0000 mg | CHEWABLE_TABLET | Freq: Four times a day (QID) | ORAL | Status: DC | PRN
  Administered 2023-04-15 – 2023-04-16 (×2): 80 mg via ORAL
  Filled 2023-04-15 (×2): qty 1

## 2023-04-15 NOTE — Progress Notes (Signed)
 Progress Note  Patient Name: Samuel Barnes Date of Encounter: 04/15/2023  Primary Cardiologist: Armanda Magic, MD  Subjective   Patient reports feeling much better, breathing significantly improved. No edema, no chest pain, no acute complaints. Shares that he quit drinking and smoking a few years back because the doctors told him he would die if he didn't.  Inpatient Medications    Scheduled Meds:  amiodarone  200 mg Oral Daily   amLODipine  5 mg Oral Daily   apixaban  5 mg Oral BID   busPIRone  5 mg Oral BID   citalopram  20 mg Oral Daily   empagliflozin  10 mg Oral Daily   furosemide  40 mg Intravenous BID   losartan  25 mg Oral Daily   methylPREDNISolone (SOLU-MEDROL) injection  40 mg Intravenous Q12H   montelukast  10 mg Oral Daily   pravastatin  20 mg Oral q1800   terazosin  5 mg Oral Daily   Continuous Infusions:  PRN Meds: acetaminophen **OR** acetaminophen, albuterol, butalbital-acetaminophen-caffeine, melatonin, ondansetron (ZOFRAN) IV   Vital Signs    Vitals:   04/14/23 0957 04/14/23 1018 04/14/23 2005 04/15/23 0400  BP: (!) 171/83 (!) 167/80 122/78   Pulse: (!) 56 (!) 55 63   Resp: 14  16 18   Temp:   97.9 F (36.6 C) 97.8 F (36.6 C)  TempSrc:   Oral Oral  SpO2: 96% 99% 92%   Weight:    95.5 kg  Height:        Intake/Output Summary (Last 24 hours) at 04/15/2023 0750 Last data filed at 04/15/2023 0406 Gross per 24 hour  Intake 30.25 ml  Output 2900 ml  Net -2869.75 ml      04/15/2023    4:00 AM 04/14/2023    4:54 AM 04/13/2023    8:15 PM  Last 3 Weights  Weight (lbs) 210 lb 8 oz 213 lb 1.6 oz 211 lb 6.7 oz  Weight (kg) 95.482 kg 96.662 kg 95.9 kg     Telemetry    SB 50s-60s - Personally Reviewed  ECG    NSR 66bpm, difficult to discern end of QT interval, LAD, IRBBB, nonspecific STTW changes - Personally Reviewed  Physical Exam   GEN: No acute distress.  HEENT: Normocephalic, atraumatic, sclera non-icteric. Neck: No JVD or  bruits. Cardiac: RRR no murmurs, rubs, or gallops.  Respiratory: Clear to auscultation bilaterally. Breathing is unlabored. GI: Soft, nontender, non-distended, BS +x 4. MS: no deformity. Extremities: No clubbing or cyanosis. No edema. Distal pedal pulses are 2+ and equal bilaterally. RUE RHC cath site without acute complication. Mild scattered ecchymosis Neuro:  AAOx3. Follows commands. Psych:  Responds to questions appropriately with a normal affect.  Labs    High Sensitivity Troponin:  No results for input(s): "TROPONINIHS" in the last 720 hours.    Cardiac EnzymesNo results for input(s): "TROPONINI" in the last 168 hours. No results for input(s): "TROPIPOC" in the last 168 hours.   Chemistry Recent Labs  Lab 04/13/23 1555 04/14/23 0531 04/14/23 0956 04/14/23 1038 04/15/23 0513  NA 133* 134* 134*  --  134*  K 3.4* 3.1* 3.4*  --  3.3*  CL 93* 95*  --   --  96*  CO2 28 28  --   --  28  GLUCOSE 173* 134*  --   --  163*  BUN 25* 28*  --   --  31*  CREATININE 1.03 0.79  --   --  0.82  CALCIUM 9.0  8.9  --   --  8.8*  PROT 6.3*  --   --  6.2* 5.8*  ALBUMIN 3.6  --   --  3.5 3.3*  AST 67*  --   --  67* 57*  ALT 97*  --   --  107* 105*  ALKPHOS 77  --   --  77 76  BILITOT 1.0  --   --  0.9 0.9  GFRNONAA >60 >60  --   --  >60  ANIONGAP 12 11  --   --  10     Hematology Recent Labs  Lab 04/13/23 1555 04/14/23 0956 04/14/23 1038 04/15/23 0513  WBC 7.9  --  8.2 9.5  RBC 4.99  --  5.11 4.95  HGB 14.3 15.3 14.6 14.2  HCT 43.7 45.0 44.9 43.2  MCV 87.6  --  87.9 87.3  MCH 28.7  --  28.6 28.7  MCHC 32.7  --  32.5 32.9  RDW 14.1  --  13.7 14.0  PLT 212  --  236 216    BNP Recent Labs  Lab 04/09/23 1413  BNP 775.4*     DDimer No results for input(s): "DDIMER" in the last 168 hours.   Radiology    CARDIAC CATHETERIZATION Result Date: 04/14/2023 Conclusions: Moderately elevated left heart and pulmonary artery pressures. Severely elevated right heart filling  pressures. Normal to mildly reduced cardiac output/index. Recommendations: Continue diuresis. Yvonne Kendall, MD [facility]   Cardiac Studies   2D Echo 04/10/23   1. Left ventricular ejection fraction, by estimation, is 55 to 60%. The left ventricle has normal function. The left ventricle has no regional wall motion abnormalities. Left ventricular diastolic parameters are consistent with Grade III diastolic  dysfunction (restrictive). Elevated left ventricular end-diastolic pressure.   2. Right ventricular systolic function is normal. The right ventricular size is mildly enlarged. There is moderately elevated pulmonary artery systolic pressure. The estimated right ventricular systolic pressure is 51.7 mmHg.   3. Left atrial size was severely dilated.   4. Right atrial size was severely dilated.   5. The mitral valve is abnormal. Mild to moderate mitral valve regurgitation.   6. Tricuspid valve regurgitation is moderate to severe.   7. The aortic valve is tricuspid. Aortic valve regurgitation is trivial.   8. The inferior vena cava is dilated in size with <50% respiratory variability, suggesting right atrial pressure of 15 mmHg.  Comparison(s): Changes from prior study are noted. 07/29/2020: LVEF 55-60%.   RHC 04/14/23  Conclusions: Moderately elevated left heart and pulmonary artery pressures. Severely elevated right heart filling pressures. Normal to mildly reduced cardiac output/index. Recommendations: Continue diuresis. Yvonne Kendall, MD [facility]   Patient Profile     73 y.o. 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 Had remote sleep study 8 years ago and told he did not have OSA. Presented with worsening SOB, felt to have acute on chornic diastolic CHF.   Assessment & Plan    1. Acute on chronic HFpEF with moderate-severe TR, mild-moderate MR, moderate pulmonary HTN - treated with IV Lasix with RHC findings above, prompting continuation of diuresis - other current GDMT: amlodipine 5mg  daily, Jardiance 10mg  daily, losartan  25mg  daily - continues to diurese well, -11.6L out thus far, weight 221->210 (last comparison 213 in 10/2022) - will continue present regimen for now, pending CXR - start KCl BID today, reassess dosing in AM - Dr. Rennis Golden raised question of amyloid given bi-atrial enlargement, will await recs on further eval for this  2. SOB, hypoxia, hyponatremia  felt likely multifactorial as well - treated for AECOPD as well - f/u CXR pending this AM - recommend assessment for formal O2 needs with ambulation at DC (currently on RA at rest)  3. Persistent atrial fibrillation with prolonged QTc - maintaining NSR/SB 50s - TSH OK - will ask MD to weigh in on amiodarone rx. I tentatively held this given QTc and elevated LFTs. EKG repeated this AM given significant QT prolongation on initial tracing 4/3. F/u EKG shows generally low voltage so difficult to discern end of QT interval. Replete K in the interim. Mg wnl.  4. Coronary/aortic calcification - reassuring NST 07/2020 - on statin therapy - has stopped smoking - no chest pain  5. HTN - manage in context above  6. Former ETOH/tobacco abuse - quit 2022  7. Transaminitis - ?etiology, has been intermittently elevated in the past  - if continues to persist, consider holding statin - see above re: amio  For questions or updates, please contact [facility] Please consult [email or web address] for contact info under Cardiology/STEMI.  Signed, Laurann Montana, PA-C 04/15/2023, 7:50 AM

## 2023-04-15 NOTE — Progress Notes (Signed)
 Mobility Specialist Progress Note:    04/15/23 1415  Mobility  Activity Ambulated with assistance in hallway  Level of Assistance Standby assist, set-up cues, supervision of patient - no hands on  Assistive Device Front wheel walker  Distance Ambulated (ft) 400 ft  Activity Response Tolerated well  Mobility Referral Yes  Mobility visit 1 Mobility  Mobility Specialist Start Time (ACUTE ONLY) 1200  Mobility Specialist Stop Time (ACUTE ONLY) 1212  Mobility Specialist Time Calculation (min) (ACUTE ONLY) 12 min   Received pt in bed having no complaints and agreeable to mobility. Pt was asymptomatic throughout ambulation and returned to room w/o fault. Left in bed w/ call bell in reach and all needs met.   Thompson Grayer Mobility Specialist  Please contact vis Secure Chat or  Rehab Office [phone]

## 2023-04-15 NOTE — Progress Notes (Signed)
 PROGRESS NOTE    Samuel Barnes [facility]  AOZ:[id number] DOB: December 25, 1950 DOA: 04/09/2023 PCP: [name], PA   Brief Narrative:  The patient is a 73 year old Caucasian male with a past medical history significant for but not limited to paroxysmal atrial fibrillation on anticoagulation, chronic diastolic CHF, history of intracranial hemorrhage and September 2022, BPH, chronic hyponatremia, essential hypertension, hyperlipidemia, who presented from [facility] ED for acute shortness of breath that has been worsening over the last several weeks.  Being admitted and treated for acute respiratory failure with hypoxia in the setting of an acute exacerbation of COPD and acute exacerbation on chronic diastolic CHF. Cardiology evaluating and are diuresing and took the patient for a right heart cath which showed that he can to be volume overloaded.   Assessment and Plan:  Acute Exacerbation of Diastolic CHF / Severe TR / Mild to Moderate MR / Pulmonary HTN: BNP elevated at 775.4 on Admission. ECHO this visit shows EF of 55-60% Grade 3 DD. Diuresis with IV Lasix 40 mg BID reduced to IV 40 mg Daily on 04/13/23 but now titrated back up to 40 mg BID per Cardiology given continued Volume Overload. Strict Is and Os and Daily Weights. Repeat CXR as below.  -Holding his beta-blocker with Metoprolol Succinate 25 mg p.o. daily given his bradycardia; Cardiology initiated SGLT2 inhibitor with Empagliflozin 10 mg p.o. daily.  Plan is for RHC and underwent it this AM and it showed he was still volume overloaded as it showed: RA 15, RV 52/15, PA 52/30, PCWP 28. Fick CO/CI 4.5/2.1, thermodilution 5.7/2.7. -Further Care per Cardiology and recommending Diuresis for another 1-2 Days; see below  Acute Exacerbation of COPD: C/w Steroid being weaned and will go to 40 mg IV daily. Completed IV Azithromycin x 5 Days. Flutter Valve and Incentive Spirometry. DuoNeb q6h now stopped. C/w Montelukast 10 mg po Daily C/w Albuterol  Neb 2.5 mg q4hprn. CXR this AM done and showed a small right pleural effusion is noted with associated atelectasis.;  Will need ambulatory home O2 screen prior to discharge  Acute Respiratory Failure with Hypoxia: In the setting of Above. C/w Diuresis and Treatment. Wean O2 as tolerated as he does not wear oxygen at home. SpO2: 99 % O2 Flow Rate (L/min): 2 L/min; Will need an Ambulatory Home O2 Screen prior to D/C; was able to be weaned off of O2  PAF: Has a CHA2DS2-VASc of 3. Metoprolol Succinate held. Now Cardiology also holding Amiodarone 200 mg po Daily. C/w Anticoagulation with Apixaban 5 mg po BID. ECHOCardiogram as above; Cardiology is following and appreciate further management and patient underwent RHC 04/14/23. Currently in Sinus  Essential HTN: C/w Amlodipine 5 mg po Daily; Holding Metoprolol Succinate 12.5 mg po Daily given Bradycardia. C/w Terazosin 5 mg po daily. CTM BP per Protocol. Last BP Reading was 136/69  HLD: C/w Lovastatin substitution for Pravastatin 20 mg po Daily  HypoNa+: Likely 2/2 to Hypervolemia but has chronic hypoosmolar hyponatremia. B/L ranging from 130-134. C/w Diuresis. Na+ went from now 134 again. CTM and Trend and repeat CMP in the AM   Hypokalemia: K+ is now 3.3. Replete with po KCL 40 mEQ BID x2. CTM and Trend and Repeat CMP in the AM   BPH: C/w Terazosin 5 mg po Daily   Normocytic Anemia: Improved. Hgb/Hct now 14.2/43.2. Anemia Panel showed an iron level of 35, UIBC 351, TIBC 386, saturation ration 9%, ferritin level 38, folate 20.6 and a vitamin B12 level of 955 CTM  for S/Sx of Bleeding; No overt bleeding noted. Repeat CBC intermittently given stability now  Generalized Anxiety Disorder: C/w Buspirone 5 mg po BID and Citalopram 20 mg po Daily   Abnormal LFTs: Mild and likely in the setting of Volume Overload but was worsening but now slightly improving. AST is now 57 and ALT is now 105. CTM and Trend and will obtain a right upper quadrant ultrasound as well  as an acute hepatitis panel in the AM. Repeat CMP in AM.  Hypoalbuminemia: Patient's Albumin level now 3.3. CTM and Trend and repeat CMP in the AM   Class I Obesity: Complicates overall prognosis and care. Estimated body mass index is 31.47 kg/m as calculated from the following:   Height as of this encounter: 5\' 9"  (1.753 m).   Weight as of this encounter: 96.7 kg. Weight Loss and Dietary Counseling given   DVT prophylaxis:  apixaban (ELIQUIS) tablet 5 mg    Code Status: Full Code Family Communication: No family -present at bedside  Disposition Plan:  Level of care: Telemetry Cardiac Status is: Inpatient Remains inpatient appropriate because: Needs further diuresis per cardiology recommendations   Consultants:  Cardiology  Procedures:  As delineated as above  Antimicrobials:  Anti-infectives (From admission, onward)    Start     Dose/Rate Route Frequency Ordered Stop   04/09/23 2200  azithromycin (ZITHROMAX) 500 mg in sodium chloride 0.9 % 250 mL IVPB       Note to Pharmacy: (In setting of acute copd exac)   500 mg 250 mL/hr over 60 Minutes Intravenous Every 24 hours 04/09/23 2053 04/13/23 2230       Subjective: Seen and examined at bedside and he was resting and felt okay.  No nausea or vomiting.  Thinks his breathing is getting better and dropped another 2 pounds from yesterday.  No other concerns or complaints at this time.  Objective: Vitals:   04/14/23 1018 04/14/23 2005 04/15/23 0400 04/15/23 0842  BP: (!) 167/80 122/78  136/69  Pulse: (!) 55 63    Resp:  16 18   Temp:  97.9 F (36.6 C) 97.8 F (36.6 C)   TempSrc:  Oral Oral   SpO2: 99% 92%    Weight:   95.5 kg   Height:        Intake/Output Summary (Last 24 hours) at 04/15/2023 1315 Last data filed at 04/15/2023 1304 Gross per 24 hour  Intake 840 ml  Output 2250 ml  Net -1410 ml   Filed Weights   04/13/23 2015 04/14/23 0454 04/15/23 0400  Weight: 95.9 kg 96.7 kg 95.5 kg   Examination: Physical  Exam:  Constitutional: WN/WD obese Caucasian male who appears calm and in NAD Respiratory: Diminished to auscultation bilaterally, no wheezing, rales, rhonchi or crackles. Normal respiratory effort and patient is not tachypenic. No accessory muscle use. Unlabored breathing  Cardiovascular: RRR, no murmurs / rubs / gallops. S1 and S2 auscultated. Mild LE edema Abdomen: Soft, non-tender, distended 2/2 body habitus. Bowel sounds positive.  GU: Deferred. Musculoskeletal: No clubbing / cyanosis of digits/nails. No joint deformity upper and lower extremities.  Skin: No rashes, lesions, ulcers on a limited skin evaluation. No induration; Warm and dry.  Neurologic: CN 2-12 grossly intact with no focal deficits. Romberg sign and cerebellar reflexes not assessed.  Psychiatric: Normal judgment and insight. Alert and oriented x 3. Normal mood and appropriate affect.   Data Reviewed: I have personally reviewed following labs and imaging studies  CBC: Recent Labs  Lab  04/11/23 0530 04/12/23 0437 04/13/23 1555 04/14/23 0956 04/14/23 1038 04/15/23 0513  WBC 6.6 7.3 7.9  --  8.2 9.5  NEUTROABS 6.1 6.8 7.0  --  7.2 8.4*  HGB 12.4* 12.1* 14.3 15.3 14.6 14.2  HCT 38.0* 37.5* 43.7 45.0 44.9 43.2  MCV 87.8 88.4 87.6  --  87.9 87.3  PLT 184 167 212  --  236 216   Basic Metabolic Panel: Recent Labs  Lab 04/11/23 0530 04/12/23 0437 04/13/23 1555 04/14/23 0531 04/14/23 0956 04/14/23 1038 04/15/23 0513  NA 133* 133* 133* 134* 134*  --  134*  K 4.3 4.0 3.4* 3.1* 3.4*  --  3.3*  CL 94* 95* 93* 95*  --   --  96*  CO2 28 28 28 28   --   --  28  GLUCOSE 155* 180* 173* 134*  --   --  163*  BUN 22 21 25* 28*  --   --  31*  CREATININE 0.73 0.72 1.03 0.79  --   --  0.82  CALCIUM 9.0 8.7* 9.0 8.9  --   --  8.8*  MG 2.2 2.0 2.1  --   --  2.2 2.2  PHOS 3.4 3.7 4.3  --   --  4.7* 4.7*   GFR: Estimated Creatinine Clearance: 91.5 mL/min (by C-G formula based on SCr of 0.82 mg/dL). Liver Function  Tests: Recent Labs  Lab 04/11/23 0530 04/12/23 0437 04/13/23 1555 04/14/23 1038 04/15/23 0513  AST 42* 56* 67* 67* 57*  ALT 55* 60* 97* 107* 105*  ALKPHOS 69 70 77 77 76  BILITOT 0.8 0.8 1.0 0.9 0.9  PROT 6.7 5.9* 6.3* 6.2* 5.8*  ALBUMIN 3.8 3.4* 3.6 3.5 3.3*   No results for input(s): "LIPASE", "AMYLASE" in the last 168 hours. No results for input(s): "AMMONIA" in the last 168 hours. Coagulation Profile: No results for input(s): "INR", "PROTIME" in the last 168 hours. Cardiac Enzymes: No results for input(s): "CKTOTAL", "CKMB", "CKMBINDEX", "TROPONINI" in the last 168 hours. BNP (last 3 results) No results for input(s): "PROBNP" in the last 8760 hours. HbA1C: No results for input(s): "HGBA1C" in the last 72 hours. CBG: Recent Labs  Lab 04/09/23 1414  GLUCAP 148*   Lipid Profile: No results for input(s): "CHOL", "HDL", "LDLCALC", "TRIG", "CHOLHDL", "LDLDIRECT" in the last 72 hours. Thyroid Function Tests: No results for input(s): "TSH", "T4TOTAL", "FREET4", "T3FREE", "THYROIDAB" in the last 72 hours. Anemia Panel: No results for input(s): "VITAMINB12", "FOLATE", "FERRITIN", "TIBC", "IRON", "RETICCTPCT" in the last 72 hours. Sepsis Labs: Recent Labs  Lab 04/10/23 0531  PROCALCITON <0.10    Recent Results (from the past 240 hours)  Resp panel by RT-PCR (RSV, Flu A&B, Covid) Anterior Nasal Swab     Status: None   Collection Time: 04/09/23  2:13 PM   Specimen: Anterior Nasal Swab  Result Value Ref Range Status   SARS Coronavirus 2 by RT PCR NEGATIVE NEGATIVE Final    Comment: (NOTE) SARS-CoV-2 target nucleic acids are NOT DETECTED.  The SARS-CoV-2 RNA is generally detectable in upper respiratory specimens during the acute phase of infection. The lowest concentration of SARS-CoV-2 viral copies this assay can detect is 138 copies/mL. A negative result does not preclude SARS-Cov-2 infection and should not be used as the sole basis for treatment or other patient  management decisions. A negative result may occur with  improper specimen collection/handling, submission of specimen other than nasopharyngeal swab, presence of viral mutation(s) within the areas targeted by this assay, and  inadequate number of viral copies(<138 copies/mL). A negative result must be combined with clinical observations, patient history, and epidemiological information. The expected result is Negative.  Fact Sheet for Patients:  BloggerCourse.com  Fact Sheet for Healthcare Providers:  SeriousBroker.it  This test is no t yet approved or cleared by the [place] FDA and  has been authorized for detection and/or diagnosis of SARS-CoV-2 by FDA under an Emergency Use Authorization (EUA). This EUA will remain  in effect (meaning this test can be used) for the duration of the COVID-19 declaration under Section 564(b)(1) of the Act, 21 U.S.C.section 360bbb-3(b)(1), unless the authorization is terminated  or revoked sooner.       Influenza A by PCR NEGATIVE NEGATIVE Final   Influenza B by PCR NEGATIVE NEGATIVE Final    Comment: (NOTE) The Xpert Xpress SARS-CoV-2/FLU/RSV plus assay is intended as an aid in the diagnosis of influenza from Nasopharyngeal swab specimens and should not be used as a sole basis for treatment. Nasal washings and aspirates are unacceptable for Xpert Xpress SARS-CoV-2/FLU/RSV testing.  Fact Sheet for Patients: BloggerCourse.com  Fact Sheet for Healthcare Providers: SeriousBroker.it  This test is not yet approved or cleared by the [place] FDA and has been authorized for detection and/or diagnosis of SARS-CoV-2 by FDA under an Emergency Use Authorization (EUA). This EUA will remain in effect (meaning this test can be used) for the duration of the COVID-19 declaration under Section 564(b)(1) of the Act, 21 U.S.C. section 360bbb-3(b)(1),  unless the authorization is terminated or revoked.     Resp Syncytial Virus by PCR NEGATIVE NEGATIVE Final    Comment: (NOTE) Fact Sheet for Patients: BloggerCourse.com  Fact Sheet for Healthcare Providers: SeriousBroker.it  This test is not yet approved or cleared by the [place] FDA and has been authorized for detection and/or diagnosis of SARS-CoV-2 by FDA under an Emergency Use Authorization (EUA). This EUA will remain in effect (meaning this test can be used) for the duration of the COVID-19 declaration under Section 564(b)(1) of the Act, 21 U.S.C. section 360bbb-3(b)(1), unless the authorization is terminated or revoked.  Performed at [facility], [address], [place], [place] [address]     Radiology Studies: DG CHEST PORT 1 VIEW Result Date: 04/15/2023 CLINICAL DATA:  Shortness of breath. EXAM: PORTABLE CHEST 1 VIEW COMPARISON:  April 12, 2023. FINDINGS: Stable cardiomegaly. Old right rib fractures are noted. Left lung is clear. Small right pleural effusion is noted with associated atelectasis. IMPRESSION: Small right pleural effusion is noted with associated atelectasis. Electronically Signed   By: Lupita Raider M.D.   On: 04/15/2023 09:55   CARDIAC CATHETERIZATION Result Date: 04/14/2023 Conclusions: Moderately elevated left heart and pulmonary artery pressures. Severely elevated right heart filling pressures. Normal to mildly reduced cardiac output/index. Recommendations: Continue diuresis. Yvonne Kendall, MD [facility]  Scheduled Meds:  amLODipine  5 mg Oral Daily   apixaban  5 mg Oral BID   busPIRone  5 mg Oral BID   citalopram  20 mg Oral Daily   empagliflozin  10 mg Oral Daily   furosemide  40 mg Intravenous BID   losartan  25 mg Oral Daily   [START ON 04/16/2023] methylPREDNISolone (SOLU-MEDROL) injection  40 mg Intravenous Q24H   montelukast  10 mg Oral Daily   potassium chloride  40 mEq  Oral BID   pravastatin  20 mg Oral q1800   terazosin  5 mg Oral Daily   Continuous Infusions:   LOS:  6 days   Marguerita Merles, DO [facility] Available via Epic secure chat 7am-7pm After these hours, please refer to coverage provider listed on [email or web address] 04/15/2023, 1:15 PM

## 2023-04-15 NOTE — Progress Notes (Signed)
 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 Patient endorses lightheadedness upon standing and walking. 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 Wife reports hearing patient wheeze at night.  In the ED, patient was hypoxic. Wheezing and 1+ BL LE edema present on exam. No JVD present. Patient given lasix 20mg  X1 and Mg 2g x1. CXR 4/2 showing atelectatic changes at lung bases and trace right pleural effusion, stable cardiomegaly. CXR on 4/5 showing worsening right pleural effusion and right lower lobe atelectasis or infiltrate. No edema. ECHO 4/3 showed LVEF 55-60% (unchanged from prior 2022 echo) with normal LV function. LV diastolic parameters consistent with Grade 3 restrictive diastolic function. RV systolic function is normal. RV mildly enlarged. MV abnormal with mild to moderate regurgitation. Tricuspid valve with moderate to severe regurgitation. Cardiology consulted. RHC 4/8 with RA 15, PA 52/30, PCWP 28, Fick CO/CI 4.5/2.1 - suggesting significant volume overload and preserved cardiac output.   Patient reports SOB is "not as bad" when laying down but is worse when ambulating. No apparent BL leg swelling on exam. Patient reports copay for jardiance of $47 is not affordable. Patient is interested in the possibility of attaining a grant. Patient expresses appreciation for jardiance grant approval  Current HF Medications: Diuretic: IV lasix 40mg  BID ACE/ARB/ARNI: Losartan 25mg  daily SGLT2i: jardiance 10mg  daily Other: Kcl daily  Prior to admission HF Medications: Diuretic: lasix 20mg  PRN Beta  blocker: Metoprolol succinate 25mg  daily ACE/ARB/ARNI: Losartan 25mg  daily  Pertinent Lab Values: Serum creatinine 0.82, BUN 31, Potassium 3.3, Sodium 134 (BL 130s), BNP 775.4, Magnesium 2.2, A1c 5.9 (2022)  Vital Signs: Weight: 210 lbs (admission weight: 221 lbs) Blood pressure: 130-140s/60-70s  Heart rate: 48-70s  I/O: net -3.06L yesterday; net -11.58L since admission 2L [place] > RA  Medication Assistance / Insurance Benefits Check: Does the patient have prescription insurance?  Yes Type of insurance plan: Medicare  Does the patient qualify for medication assistance through manufacturers or grants?   Yes Eligible grants and/or patient assistance programs: [employer] Jardiance Approved medication assistance renewals will be completed by: [facility]  Outpatient Pharmacy:  Prior to admission outpatient pharmacy: [facility] mail order pharmacy  Is the patient willing to use [facility] TOC pharmacy at discharge? Yes Is the patient willing to transition their outpatient pharmacy to utilize a [facility] outpatient pharmacy?   Yes    Assessment: 1. Acute on chronic diastolic CHF (LVEF 55-60%), due to unknown etiology. NYHA class 2 symptoms.Strict I/Os and daily weights. Keep K>4 and Mg>2. Mild LE edema; JVD present on exam per cards. RHC results reveal significant fluid overload.  - Agree with holding PTA Metoprolol given volume overloaded and low HR  - Consider MRA (spironolactone 12.5mg ) on discharge for HFpEF GDMT titration  - Continue jardiance 10mg  daily  - Continue losartan 25mg  daily    Plan: 1) Medication changes recommended at this time: - Monitor K after scheduling KCl, with plans to discontinue once normalized/requiring less lasix   2) Patient assistance: - Patient reports copay for jardiance of $47 is not affordable  - Patient meets criteria for  jardiance grant and has been approved (copay will be $0 - information provided to [facility] pharmacy system) - [facility] pharmacy contacted to obtain  profile transfer from [facility]  3)  Education  - Patient has been educated on current HF medications and potential additions to HF medication regimen - Patient verbalizes understanding that over the next few months, these medication doses may change and more medications may be added to optimize HF regimen - Patient has been educated on basic disease state pathophysiology and goals of therapy   Verdene Rio, PharmD PGY1 Pharmacy Resident

## 2023-04-16 ENCOUNTER — Encounter (HOSPITAL_COMMUNITY): Payer: Self-pay | Admitting: Internal Medicine

## 2023-04-16 ENCOUNTER — Other Ambulatory Visit (HOSPITAL_COMMUNITY): Payer: Self-pay

## 2023-04-16 DIAGNOSIS — I480 Paroxysmal atrial fibrillation: Secondary | ICD-10-CM | POA: Diagnosis not present

## 2023-04-16 DIAGNOSIS — J441 Chronic obstructive pulmonary disease with (acute) exacerbation: Secondary | ICD-10-CM | POA: Diagnosis not present

## 2023-04-16 DIAGNOSIS — I5033 Acute on chronic diastolic (congestive) heart failure: Secondary | ICD-10-CM | POA: Diagnosis not present

## 2023-04-16 LAB — CBC WITH DIFFERENTIAL/PLATELET
Abs Immature Granulocytes: 0.09 10*3/uL — ABNORMAL HIGH (ref 0.00–0.07)
Basophils Absolute: 0 10*3/uL (ref 0.0–0.1)
Basophils Relative: 0 %
Eosinophils Absolute: 0 10*3/uL (ref 0.0–0.5)
Eosinophils Relative: 0 %
HCT: 42.9 % (ref 39.0–52.0)
Hemoglobin: 14.2 g/dL (ref 13.0–17.0)
Immature Granulocytes: 1 %
Lymphocytes Relative: 8 %
Lymphs Abs: 0.9 10*3/uL (ref 0.7–4.0)
MCH: 29 pg (ref 26.0–34.0)
MCHC: 33.1 g/dL (ref 30.0–36.0)
MCV: 87.6 fL (ref 80.0–100.0)
Monocytes Absolute: 0.9 10*3/uL (ref 0.1–1.0)
Monocytes Relative: 8 %
Neutro Abs: 9.3 10*3/uL — ABNORMAL HIGH (ref 1.7–7.7)
Neutrophils Relative %: 83 %
Platelets: 217 10*3/uL (ref 150–400)
RBC: 4.9 MIL/uL (ref 4.22–5.81)
RDW: 14.2 % (ref 11.5–15.5)
WBC: 11.2 10*3/uL — ABNORMAL HIGH (ref 4.0–10.5)
nRBC: 0 % (ref 0.0–0.2)

## 2023-04-16 LAB — COMPREHENSIVE METABOLIC PANEL WITH GFR
ALT: 99 U/L — ABNORMAL HIGH (ref 0–44)
AST: 52 U/L — ABNORMAL HIGH (ref 15–41)
Albumin: 3.3 g/dL — ABNORMAL LOW (ref 3.5–5.0)
Alkaline Phosphatase: 67 U/L (ref 38–126)
Anion gap: 10 (ref 5–15)
BUN: 35 mg/dL — ABNORMAL HIGH (ref 8–23)
CO2: 29 mmol/L (ref 22–32)
Calcium: 8.7 mg/dL — ABNORMAL LOW (ref 8.9–10.3)
Chloride: 95 mmol/L — ABNORMAL LOW (ref 98–111)
Creatinine, Ser: 0.75 mg/dL (ref 0.61–1.24)
GFR, Estimated: >60 mL/min (ref >60)
Glucose, Bld: 91 mg/dL (ref 70–99)
Potassium: 4.1 mmol/L (ref 3.5–5.1)
Sodium: 134 mmol/L — ABNORMAL LOW (ref 135–145)
Total Bilirubin: 1 mg/dL (ref 0.0–1.2)
Total Protein: 5.7 g/dL — ABNORMAL LOW (ref 6.5–8.1)

## 2023-04-16 LAB — MAGNESIUM: Magnesium: 2.2 mg/dL (ref 1.7–2.4)

## 2023-04-16 LAB — PHOSPHORUS: Phosphorus: 3.8 mg/dL (ref 2.5–4.6)

## 2023-04-16 MED ORDER — FLUTICASONE FUROATE-VILANTEROL 100-25 MCG/ACT IN AEPB
1.0000 | INHALATION_SPRAY | Freq: Every day | RESPIRATORY_TRACT | Status: DC
  Administered 2023-04-16 – 2023-04-17 (×2): 1 via RESPIRATORY_TRACT
  Filled 2023-04-16: qty 28

## 2023-04-16 MED ORDER — PREDNISONE 20 MG PO TABS
20.0000 mg | ORAL_TABLET | Freq: Every day | ORAL | Status: DC
  Administered 2023-04-17: 20 mg via ORAL
  Filled 2023-04-16: qty 1

## 2023-04-16 MED ORDER — FUROSEMIDE 40 MG PO TABS
40.0000 mg | ORAL_TABLET | Freq: Every day | ORAL | Status: DC
  Administered 2023-04-17: 40 mg via ORAL
  Filled 2023-04-16: qty 1

## 2023-04-16 NOTE — Progress Notes (Addendum)
 Progress Note  Patient Name: Samuel Barnes Date of Encounter: 04/16/2023  Primary Cardiologist: Armanda Magic, MD  Subjective   Reports he still gets winded with activity but feels this is pretty much back to baseline. No CP. BP 163/55 this AM, rechecked when I was in the room at 137/72.  Inpatient Medications    Scheduled Meds:  amLODipine  5 mg Oral Daily   apixaban  5 mg Oral BID   busPIRone  5 mg Oral BID   citalopram  20 mg Oral Daily   empagliflozin  10 mg Oral Daily   furosemide  40 mg Intravenous BID   losartan  25 mg Oral Daily   methylPREDNISolone (SOLU-MEDROL) injection  40 mg Intravenous Q24H   montelukast  10 mg Oral Daily   potassium chloride  40 mEq Oral BID   pravastatin  20 mg Oral q1800   terazosin  5 mg Oral Daily   Continuous Infusions:  PRN Meds: acetaminophen **OR** acetaminophen, albuterol, butalbital-acetaminophen-caffeine, melatonin, ondansetron (ZOFRAN) IV, simethicone   Vital Signs    Vitals:   04/15/23 0842 04/15/23 1406 04/15/23 2000 04/16/23 0421  BP: 136/69 (!) 127/56 129/61 (!) 163/65  Pulse:  66 67 66  Resp:  18 17 19   Temp:  97.7 F (36.5 C) 97.7 F (36.5 C) 97.7 F (36.5 C)  TempSrc:  Oral Oral Oral  SpO2:  96% 96% 96%  Weight:    96 kg  Height:        Intake/Output Summary (Last 24 hours) at 04/16/2023 0812 Last data filed at 04/16/2023 0420 Gross per 24 hour  Intake 840 ml  Output 1670 ml  Net -830 ml      04/16/2023    4:21 AM 04/15/2023    4:00 AM 04/14/2023    4:54 AM  Last 3 Weights  Weight (lbs) 211 lb 11.2 oz 210 lb 8 oz 213 lb 1.6 oz  Weight (kg) 96.026 kg 95.482 kg 96.662 kg     Telemetry    NSR - Personally Reviewed  Physical Exam   GEN: No acute distress.  HEENT: Normocephalic, atraumatic, sclera non-icteric. Neck: No JVD or bruits. Cardiac: RRR no murmurs, rubs, or gallops.  Respiratory: Clear to auscultation bilaterally. Breathing is unlabored. GI: Soft, nontender, non-distended, BS +x 4. MS:  no deformity. Extremities: No clubbing or cyanosis. No edema. Distal pedal pulses are 2+ and equal bilaterally. RHC site without complication. Mild scattered ecchymoses UE. Neuro:  AAOx3. Follows commands. Psych:  Responds to questions appropriately with a normal affect.  Labs    High Sensitivity Troponin:  No results for input(s): "TROPONINIHS" in the last 720 hours.    Cardiac EnzymesNo results for input(s): "TROPONINI" in the last 168 hours. No results for input(s): "TROPIPOC" in the last 168 hours.   Chemistry Recent Labs  Lab 04/14/23 0531 04/14/23 0956 04/14/23 1038 04/15/23 0513 04/16/23 0700  NA 134* 134*  --  134* 134*  K 3.1* 3.4*  --  3.3* 4.1  CL 95*  --   --  96* 95*  CO2 28  --   --  28 29  GLUCOSE 134*  --   --  163* 91  BUN 28*  --   --  31* 35*  CREATININE 0.79  --   --  0.82 0.75  CALCIUM 8.9  --   --  8.8* 8.7*  PROT  --   --  6.2* 5.8* 5.7*  ALBUMIN  --   --  3.5 3.3*  3.3*  AST  --   --  67* 57* 52*  ALT  --   --  107* 105* 99*  ALKPHOS  --   --  77 76 67  BILITOT  --   --  0.9 0.9 1.0  GFRNONAA >60  --   --  >60 >60  ANIONGAP 11  --   --  10 10     Hematology Recent Labs  Lab 04/14/23 1038 04/15/23 0513 04/16/23 0409  WBC 8.2 9.5 11.2*  RBC 5.11 4.95 4.90  HGB 14.6 14.2 14.2  HCT 44.9 43.2 42.9  MCV 87.9 87.3 87.6  MCH 28.6 28.7 29.0  MCHC 32.5 32.9 33.1  RDW 13.7 14.0 14.2  PLT 236 216 217    BNP Recent Labs  Lab 04/09/23 1413  BNP 775.4*     DDimer No results for input(s): "DDIMER" in the last 168 hours.   Radiology    DG CHEST PORT 1 VIEW Result Date: 04/15/2023 CLINICAL DATA:  Shortness of breath. EXAM: PORTABLE CHEST 1 VIEW COMPARISON:  April 12, 2023. FINDINGS: Stable cardiomegaly. Old right rib fractures are noted. Left lung is clear. Small right pleural effusion is noted with associated atelectasis. IMPRESSION: Small right pleural effusion is noted with associated atelectasis. Electronically Signed   By: Lupita Raider M.D.    On: 04/15/2023 09:55   CARDIAC CATHETERIZATION Result Date: 04/14/2023 Conclusions: Moderately elevated left heart and pulmonary artery pressures. Severely elevated right heart filling pressures. Normal to mildly reduced cardiac output/index. Recommendations: Continue diuresis. Yvonne Kendall, MD [facility]   Cardiac Studies   2D Echo 04/10/23    1. Left ventricular ejection fraction, by estimation, is 55 to 60%. The left ventricle has normal function. The left ventricle has no regional wall motion abnormalities. Left ventricular diastolic parameters are consistent with Grade III diastolic  dysfunction (restrictive). Elevated left ventricular end-diastolic pressure.   2. Right ventricular systolic function is normal. The right ventricular size is mildly enlarged. There is moderately elevated pulmonary artery systolic pressure. The estimated right ventricular systolic pressure is 51.7 mmHg.   3. Left atrial size was severely dilated.   4. Right atrial size was severely dilated.   5. The mitral valve is abnormal. Mild to moderate mitral valve regurgitation.   6. Tricuspid valve regurgitation is moderate to severe.   7. The aortic valve is tricuspid. Aortic valve regurgitation is trivial.   8. The inferior vena cava is dilated in size with <50% respiratory variability, suggesting right atrial pressure of 15 mmHg.  Comparison(s): Changes from prior study are noted. 07/29/2020: LVEF 55-60%.    RHC 04/14/23   Conclusions: Moderately elevated left heart and pulmonary artery pressures. Severely elevated right heart filling pressures. Normal to mildly reduced cardiac output/index. Recommendations: Continue diuresis. Yvonne Kendall, MD [facility]      Patient Profile     73 y.o. 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 Had remote sleep study 8 years ago and told he did not have OSA. Presented with worsening SOB, felt to have acute on chornic diastolic CHF.   Assessment & Plan    1. Acute on chronic HFpEF with moderate-severe TR, mild-moderate MR, moderate pulmonary  HTN, HTN - treated with IV Lasix with RHC findings above, prompting continuation of diuresis - other current GDMT: amlodipine 5mg  daily, Jardiance 10mg  daily, losartan 25mg  daily - Dr. Rennis Golden raised question of amyloid given bi-atrial enlargement, will await recs on further eval for this - anticipate he would benefit from outpatient sleep study arranged at follow-up - diuresed -12.4L thus far weight 221->211 (last comparison 213 in 10/2022), - will discuss diuretic plan with MD - urination has tapered off, BUN slightly up, weight plateauing   2. SOB, hypoxia, hyponatremia felt likely multifactorial as well - treated for AECOPD as well - f/u CXR 4/8 with small right pleural effusion  - recommend assessment for formal O2 needs with ambulation at DC (currently on RA at rest)   3. Persistent atrial fibrillation with prolonged QTc - maintaining NSR/SB 50s - TSH OK - amiodarone discontinued this admission given prolonged QT interval, difficult to assess on EKG tracings but did appear long. His previous atrial fib occurred in the context of heavy alcohol use, no longer using. If his afib recurs will need EP input on options for care - K, Mg OK - QTC ~528ms on telemetry today   4. Coronary/aortic calcification - reassuring NST 07/2020 - on statin therapy with LDL 57 in 10/2022, follow LFTs - has stopped smoking - no chest pain   5. HTN - manage in context above   6. Former ETOH/tobacco abuse - quit 2022   7.  Transaminitis - ?etiology, has been intermittently elevated in the past  - if continues to persist, consider getting imaging - holding amiodarone as above  I tentatively arranged f/u 4/22 as well  For questions or updates, please contact [facility] Please consult [email or web address] for contact info under Cardiology/STEMI.  Signed, Laurann Montana, PA-C 04/16/2023, 8:12 AM

## 2023-04-16 NOTE — Progress Notes (Signed)
 Mobility Specialist Progress Note;   04/16/23 1010  Mobility  Activity Ambulated with assistance in hallway  Level of Assistance Standby assist, set-up cues, supervision of patient - no hands on  Assistive Device Front wheel walker  Distance Ambulated (ft) 350 ft  Activity Response Tolerated well  Mobility Referral Yes  Mobility visit 1 Mobility  Mobility Specialist Start Time (ACUTE ONLY) 1010  Mobility Specialist Stop Time (ACUTE ONLY) 1024  Mobility Specialist Time Calculation (min) (ACUTE ONLY) 14 min   Pt eager for mobility. Required no physical assistance during ambulation, SV. VSS throughout and no c/o when asked. Pt returned back to bed with all needs met.   Samuel Barnes Mobility Specialist Please contact via SecureChat or [employer] [phone]

## 2023-04-16 NOTE — Progress Notes (Signed)
 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 Patient endorses lightheadedness upon standing and walking. 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 Wife reports hearing patient wheeze at night.  In the ED, patient was hypoxic. Wheezing and 1+ BL LE edema present on exam. No JVD present. Patient given lasix 20mg  X1 and Mg 2g x1. CXR 4/2 showing atelectatic changes at lung bases and trace right pleural effusion, stable cardiomegaly. CXR on 4/5 showing worsening right pleural effusion and right lower lobe atelectasis or infiltrate. No edema. ECHO 4/3 showed LVEF 55-60% (unchanged from prior 2022 echo) with normal LV function. LV diastolic parameters consistent with Grade 3 restrictive diastolic function. RV systolic function is normal. RV mildly enlarged. MV abnormal with mild to moderate regurgitation. Tricuspid valve with moderate to severe regurgitation. Cardiology consulted. RHC 4/8 with RA 15, PA 52/30, PCWP 28, Fick CO/CI 4.5/2.1 - suggesting significant volume overload and preserved cardiac output.   Patient's daughter present for discussion. Patient reports SOB is still present when ambulating but is much improved since yesterday. States that SOB with activity is not as it was at home but is "getting there." No apparent BL leg swelling on exam. It was reinforced that [facility] outpatient pharmacy will have access to grant information and no other interventions will be needed from the patient's part.   Current HF Medications: Diuretic: IV lasix 40mg  BID ACE/ARB/ARNI: Losartan 25mg  daily SGLT2i: jardiance 10mg   daily Other: Kcl daily  Prior to admission HF Medications: Diuretic: lasix 20mg  PRN Beta blocker: Metoprolol succinate 25mg  daily ACE/ARB/ARNI: Losartan 25mg  daily  Pertinent Lab Values: Serum creatinine 0.82>0.75, BUN 31>35, Potassium 4.1, Sodium 134 (BL 130s), BNP 775.4, Magnesium 2.2, A1c 5.9 (2022)  Vital Signs: Weight: 211 lbs (admission weight: 221 lbs) Blood pressure: 130-160s/60-70s  Heart rate: 50-70s  I/O: net - yesterday; net -12.42L since admission 2L [place] > RA  Medication Assistance / Insurance Benefits Check: Does the patient have prescription insurance?  Yes Type of insurance plan: Medicare  Does the patient qualify for medication assistance through manufacturers or grants?   Yes Eligible grants and/or patient assistance programs: [employer] Approved medication assistance renewals will be completed by: [facility]  Outpatient Pharmacy:  Prior to admission outpatient pharmacy: [facility] mail order pharmacy  Is the patient willing to use [facility] TOC pharmacy at discharge? Yes Is the patient willing to transition their outpatient pharmacy to utilize a [facility] outpatient pharmacy?   Yes    Assessment: 1. Acute on chronic diastolic CHF (LVEF 55-60%), due to unknown etiology. NYHA class 2 symptoms.Strict I/Os and daily weights. Keep K>4 and Mg>2. No JVD or BL edema present on exam per cards today. RHC results reveal significant fluid overload.  - Agree with holding PTA Metoprolol given volume overloaded and low HR  - Consider MRA (spironolactone 12.5mg ) on discharge for HFpEF GDMT titration  - Continue jardiance 10mg  daily  - Continue losartan 25mg  daily  - Consider decreasing lasix to 40mg  daily given no JVD or LE swelling on exam and UOP decline yesterday    Plan: 1) Medication changes  recommended at this time: - Agree with transitioning from IV to PO lasix tomorrow  - Agree with discontinuing Kcl tomorrow when lasix will be transitioned to PO - Recommend  adding Spironolactone 12.5mg  tomorrow if BUN and Scr are stable   2) Patient assistance: - Patient reports copay for jardiance of $47 is not affordable  - Patient meets criteria for [employer] and has been approved (copay will be $0 - information provided to [facility] pharmacy system) - [facility] pharmacy contacted to obtain profile transfer from [facility]  3)  Education  - Patient has been educated on current HF medications and potential additions to HF medication regimen - Patient verbalizes understanding that over the next few months, these medication doses may change and more medications may be added to optimize HF regimen - Patient has been educated on basic disease state pathophysiology and goals of therapy   Verdene Rio, PharmD PGY1 Pharmacy Resident

## 2023-04-16 NOTE — Progress Notes (Signed)
 [facility] PROGRESS NOTE   Samuel Barnes BJY:[id number] DOB: 03-08-1950 DOA: 04/09/2023  PCP: [name], PA  Brief History: 73 year old Caucasian male with a past medical history significant for but not limited to paroxysmal atrial fibrillation on anticoagulation, chronic diastolic CHF, history of intracranial hemorrhage and September 2022, BPH, chronic hyponatremia, essential hypertension, hyperlipidemia, who presented from [facility] ED for acute shortness of breath that has been worsening over the last several weeks.  Patient was hospitalized for further management.     Consultants: Cardiology  Procedures: Echocardiogram    Subjective/Interval History: Patient mentions that he feels better.  Shortness of breath is improved.  Denies any chest pain.  No nausea or vomiting.    Assessment/Plan:  Acute Exacerbation of Diastolic CHF / Severe TR / Mild to Moderate MR / Pulmonary HTN: BNP elevated at 775.4 on Admission.  ECHO this visit shows EF of 55-60% Grade 3 DD.  Patient is seen by cardiology.  Patient underwent right heart catheterization. Started on IV diuretics. Seems to be improving. Beta-blocker on hold due to bradycardia. Noted to be on losartan, Jardiance. Plan is to transition to oral diuretics tomorrow.   Acute Exacerbation of COPD:  There was concern for COPD exacerbation as well.  Patient was given azithromycin.  Started on steroids.  No significant wheezing heard on examination. No recent smoking. Transition to oral steroids.  Continue with nebulizer treatments.   He was on Incruse Ellipta prior to admission.  Could be resumed at discharge.     Acute Respiratory Failure with Hypoxia: Multifactorial including CHF as well as COPD. Has been weaned off of oxygen.  Will need to check ambulatory pulse oximetry.     Paroxysmal atrial fibrillation Has a CHA2DS2-VASc of 3.  Amiodarone discontinued due to prolonged QT interval.  Metoprolol  held due to bradycardia.   Stable for the most part.  Anticoagulated with apixaban.     Essential HTN:  C/w Amlodipine 5 mg po Daily;  Holding Metoprolol Succinate 12.5 mg po Daily given Bradycardia.  C/w Terazosin 5 mg po daily.    HLD: C/w Lovastatin substitution for Pravastatin 20 mg po Daily   Hyponatremia:  Likely 2/2 to Hypervolemia but has chronic hypoosmolar hyponatremia.    Hypokalemia: Supplement today.  Magnesium is 2.2.   BPH: C/w Terazosin 5 mg po Daily    Generalized Anxiety Disorder: C/w Buspirone 5 mg po BID and Citalopram 20 mg po Daily    Abnormal LFTs:  Mild and likely in the setting of Volume Overload but was worsening but now slightly improving. Can be monitored in the outpatient setting   Class I Obesity: Complicates overall prognosis and care. Estimated body mass index is 31.47 kg/m as calculated from the following:   Height as of this encounter: 5\' 9"  (1.753 m).   Weight as of this encounter: 96.7 kg. Weight Loss and Dietary Counseling given    DVT Prophylaxis: On apixaban Code Status: Full code Family Communication: Discussed with patient Disposition Plan: Hopefully home in 1 to 2 days   Medications: Scheduled:  amLODipine  5 mg Oral Daily   apixaban  5 mg Oral BID   busPIRone  5 mg Oral BID   citalopram  20 mg Oral Daily   empagliflozin  10 mg Oral Daily   furosemide  40 mg Intravenous BID   [START ON 04/17/2023] furosemide  40 mg Oral Daily   losartan  25 mg Oral Daily   methylPREDNISolone (SOLU-MEDROL) injection  40 mg Intravenous Q24H   montelukast  10 mg Oral Daily   potassium chloride  40 mEq Oral BID   pravastatin  20 mg Oral q1800   terazosin  5 mg Oral Daily   Continuous: UJW:[id number] **OR** acetaminophen, albuterol, butalbital-acetaminophen-caffeine, melatonin, ondansetron (ZOFRAN) IV, simethicone  Antibiotics: Anti-infectives (From admission, onward)    Start     Dose/Rate Route Frequency Ordered Stop   04/09/23 2200   azithromycin (ZITHROMAX) 500 mg in sodium chloride 0.9 % 250 mL IVPB       Note to Pharmacy: (In setting of acute copd exac)   500 mg 250 mL/hr over 60 Minutes Intravenous Every 24 hours 04/09/23 2053 04/13/23 2230       Objective:  Vital Signs  Vitals:   04/15/23 2000 04/16/23 0421 04/16/23 0700 04/16/23 0800  BP: 129/61 (!) 163/65    Pulse: 67 66 (!) 40 (!) 38  Resp: 17 19    Temp: 97.7 F (36.5 C) 97.7 F (36.5 C)    TempSrc: Oral Oral    SpO2: 96% 96% 91% 100%  Weight:  96 kg    Height:        Intake/Output Summary (Last 24 hours) at 04/16/2023 1105 Last data filed at 04/16/2023 0420 Gross per 24 hour  Intake 360 ml  Output 1495 ml  Net -1135 ml   Filed Weights   04/14/23 0454 04/15/23 0400 04/16/23 0421  Weight: 96.7 kg 95.5 kg 96 kg    General appearance: Awake alert.  In no distress Resp: Good air entry bilaterally but somewhat diminished in the bases.  No crackles or wheezing. Cardio: S1-S2 is normal regular.  No S3-S4.  No rubs murmurs or bruit GI: Abdomen is soft.  Nontender nondistended.  Bowel sounds are present normal.  No masses organomegaly Extremities: No edema.  Full range of motion of lower extremities. Neurologic: Alert and oriented x3.  No focal neurological deficits.    Lab Results:  Data Reviewed: I have personally reviewed following labs and reports of the imaging studies  CBC: Recent Labs  Lab 04/12/23 0437 04/13/23 1555 04/14/23 0956 04/14/23 1038 04/15/23 0513 04/16/23 0409  WBC 7.3 7.9  --  8.2 9.5 11.2*  NEUTROABS 6.8 7.0  --  7.2 8.4* 9.3*  HGB 12.1* 14.3 15.3 14.6 14.2 14.2  HCT 37.5* 43.7 45.0 44.9 43.2 42.9  MCV 88.4 87.6  --  87.9 87.3 87.6  PLT 167 212  --  236 216 217    Basic Metabolic Panel: Recent Labs  Lab 04/12/23 0437 04/13/23 1555 04/14/23 0531 04/14/23 0956 04/14/23 1038 04/15/23 0513 04/16/23 0700  NA 133* 133* 134* 134*  --  134* 134*  K 4.0 3.4* 3.1* 3.4*  --  3.3* 4.1  CL 95* 93* 95*  --   --   96* 95*  CO2 28 28 28   --   --  28 29  GLUCOSE 180* 173* 134*  --   --  163* 91  BUN 21 25* 28*  --   --  31* 35*  CREATININE 0.72 1.03 0.79  --   --  0.82 0.75  CALCIUM 8.7* 9.0 8.9  --   --  8.8* 8.7*  MG 2.0 2.1  --   --  2.2 2.2 2.2  PHOS 3.7 4.3  --   --  4.7* 4.7* 3.8    GFR: Estimated Creatinine Clearance: 94 mL/min (by C-G formula based on SCr of 0.75 mg/dL).  Liver Function Tests: Recent Labs  Lab 04/12/23 0437 04/13/23 1555 04/14/23 1038 04/15/23 0513 04/16/23 0700  AST 56* 67* 67* 57* 52*  ALT 60* 97* 107* 105* 99*  ALKPHOS 70 77 77 76 67  BILITOT 0.8 1.0 0.9 0.9 1.0  PROT 5.9* 6.3* 6.2* 5.8* 5.7*  ALBUMIN 3.4* 3.6 3.5 3.3* 3.3*    CBG: Recent Labs  Lab 04/09/23 1414  GLUCAP 148*    Recent Results (from the past 240 hours)  Resp panel by RT-PCR (RSV, Flu A&B, Covid) Anterior Nasal Swab     Status: None   Collection Time: 04/09/23  2:13 PM   Specimen: Anterior Nasal Swab  Result Value Ref Range Status   SARS Coronavirus 2 by RT PCR NEGATIVE NEGATIVE Final    Comment: (NOTE) SARS-CoV-2 target nucleic acids are NOT DETECTED.  The SARS-CoV-2 RNA is generally detectable in upper respiratory specimens during the acute phase of infection. The lowest concentration of SARS-CoV-2 viral copies this assay can detect is 138 copies/mL. A negative result does not preclude SARS-Cov-2 infection and should not be used as the sole basis for treatment or other patient management decisions. A negative result may occur with  improper specimen collection/handling, submission of specimen other than nasopharyngeal swab, presence of viral mutation(s) within the areas targeted by this assay, and inadequate number of viral copies(<138 copies/mL). A negative result must be combined with clinical observations, patient history, and epidemiological information. The expected result is Negative.  Fact Sheet for Patients:  BloggerCourse.com  Fact Sheet for  Healthcare Providers:  SeriousBroker.it  This test is no t yet approved or cleared by the [place] FDA and  has been authorized for detection and/or diagnosis of SARS-CoV-2 by FDA under an Emergency Use Authorization (EUA). This EUA will remain  in effect (meaning this test can be used) for the duration of the COVID-19 declaration under Section 564(b)(1) of the Act, 21 U.S.C.section 360bbb-3(b)(1), unless the authorization is terminated  or revoked sooner.       Influenza A by PCR NEGATIVE NEGATIVE Final   Influenza B by PCR NEGATIVE NEGATIVE Final    Comment: (NOTE) The Xpert Xpress SARS-CoV-2/FLU/RSV plus assay is intended as an aid in the diagnosis of influenza from Nasopharyngeal swab specimens and should not be used as a sole basis for treatment. Nasal washings and aspirates are unacceptable for Xpert Xpress SARS-CoV-2/FLU/RSV testing.  Fact Sheet for Patients: BloggerCourse.com  Fact Sheet for Healthcare Providers: SeriousBroker.it  This test is not yet approved or cleared by the [place] FDA and has been authorized for detection and/or diagnosis of SARS-CoV-2 by FDA under an Emergency Use Authorization (EUA). This EUA will remain in effect (meaning this test can be used) for the duration of the COVID-19 declaration under Section 564(b)(1) of the Act, 21 U.S.C. section 360bbb-3(b)(1), unless the authorization is terminated or revoked.     Resp Syncytial Virus by PCR NEGATIVE NEGATIVE Final    Comment: (NOTE) Fact Sheet for Patients: BloggerCourse.com  Fact Sheet for Healthcare Providers: SeriousBroker.it  This test is not yet approved or cleared by the [place] FDA and has been authorized for detection and/or diagnosis of SARS-CoV-2 by FDA under an Emergency Use Authorization (EUA). This EUA will remain in effect (meaning this  test can be used) for the duration of the COVID-19 declaration under Section 564(b)(1) of the Act, 21 U.S.C. section 360bbb-3(b)(1), unless the authorization is terminated or revoked.  Performed at [facility], [address], [place], [place] [address]  Radiology Studies: DG CHEST PORT 1 VIEW Result Date: 04/15/2023 CLINICAL DATA:  Shortness of breath. EXAM: PORTABLE CHEST 1 VIEW COMPARISON:  April 12, 2023. FINDINGS: Stable cardiomegaly. Old right rib fractures are noted. Left lung is clear. Small right pleural effusion is noted with associated atelectasis. IMPRESSION: Small right pleural effusion is noted with associated atelectasis. Electronically Signed   By: Lupita Raider M.D.   On: 04/15/2023 09:55       LOS: 7 days   Tenley Winward Rito Ehrlich  [facility] Pager on [email or web address]  04/16/2023, 11:05 AM

## 2023-04-16 NOTE — Progress Notes (Signed)
 Heart Failure Nurse Navigator Progress Note  PCP: [name], PA PCP-Cardiologist: Mayford Knife Admission Diagnosis: Acute on chronic congestive heart failure, Acute respiratory failure with hypoxia, Non intractable headache.  Admitted from: Home  Presentation:   [facility] presented with shortness of breath that started 1 week ago, home SpO2 88% on room air, +1 Bil edema, lightheadedness on standing and walking, BP 164/75, HR 50, BNP 775.4, CXR shows minimal right pleural effusion, EKG with Normal Sinus rhythm, CT head unremarkable. RHC on 04/14/23, showed moderately elevated left heart and pulmonary artery pressures, Severely elevated right filing pressures, Normal to mildly reduced cardiac output/ index. Question raised by Dr. Rennis Golden of amyloid given bi-atrial enlargement. Await further evaluation.   Patient and his daughter were educated on the sign and symptoms of heart failure, daily weights, when to call his doctor or go to the ED. Diet/ fluid restrictions, taking all his medications as prescribed and attending all medical appointments. Both verbalized their understanding of the education. A HF TOC appointment was scheduled for 04/28/2023 @ 10:15 am, patient was unable to attend any other earlier appointments. Patient is scheduled for a [facility] appointment on 04/29/2023.   ECHO/ LVEF: 55-60%  Clinical Course:  Past Medical History:  Diagnosis Date   Anxiety    Aortic atherosclerosis (HCC) 07/28/2020   Atrial fibrillation (HCC)    BPH (benign prostatic hyperplasia)    Carotid artery stenosis, asymptomatic, bilateral    1 to 39% right ICA stenosis and 40 to 59% left ICA stenosis by Dopplers 09/2022   CHF (congestive heart failure) (HCC)    Class 2 obesity 07/28/2020   COPD (chronic obstructive pulmonary disease) (HCC)    Coronary artery calcification 07/28/2020   Dementia (HCC)    Depression    Family history of adverse reaction to anesthesia    Hyperlipidemia 07/28/2020    Hypertension    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   Tobacco use 07/28/2020     Social History   Socioeconomic History   Marital status: Divorced    Spouse name: Not on file   Number of children: Not on file   Years of education: Not on file   Highest education level: Not on file  Occupational History   Not on file  Tobacco Use   Smoking status: Former    Current packs/day: 0.00    Average packs/day: 1.5 packs/day for 55.0 years (82.5 ttl pk-yrs)    Types: Cigarettes    Start date: 59    Quit date: 08/14/2020    Years since quitting: 2.6   Smokeless tobacco: Never   Tobacco comments:    Former smoker 01/30/21  Vaping Use   Vaping status: Never Used  Substance and Sexual Activity   Alcohol use: Not Currently    Alcohol/week: 1.0 standard drink of alcohol    Types: 1 Shots of liquor per week   Drug use: Never   Sexual activity: Not on file  Other Topics Concern   Not on file  Social History Narrative   Not on file   Social Drivers of Health   Financial Resource Strain: Not on file  Food Insecurity: No Food Insecurity (04/09/2023)   Hunger Vital Sign    Worried About Running Out of Food in the Last Year: Never true    Ran Out of Food in the Last Year: Never true  Transportation Needs: Unknown (04/09/2023)   PRAPARE - Transportation  Lack of Transportation (Medical): No    Lack of Transportation (Non-Medical): Not on file  Physical Activity: Not on file  Stress: Not on file  Social Connections: Unknown (04/09/2023)   Social Connection and Isolation Panel [NHANES]    Frequency of Communication with Friends and Family: Never    Frequency of Social Gatherings with Friends and Family: Never    Attends Religious Services: Never    Database administrator or Organizations: Yes    Attends Engineer, structural: Patient declined    Marital Status: Patient declined   Education Assessment and  Provision:  Detailed education and instructions provided on heart failure disease management including the following:  Signs and symptoms of Heart Failure When to call the physician Importance of daily weights Low sodium diet Fluid restriction Medication management Anticipated future follow-up appointments  Patient education given on each of the above topics.  Patient acknowledges understanding via teach back method and acceptance of all instructions.  Education Materials:  "Living Better With Heart Failure" Booklet, HF zone tool, & Daily Weight Tracker Tool.  Patient has scale at home: Yes Patient has pill box at home: Yes    High Risk Criteria for Readmission and/or Poor Patient Outcomes: Heart failure hospital admissions (last 6 months): 1  No Show rate: 3 %  Difficult social situation: No , Lives alone Demonstrates medication adherence: Yes Primary Language: English Literacy level: Reading, writing, and comprehension.   Barriers of Care:   Continued HF education  Considerations/Referrals:   Referral made to Heart Failure Pharmacist Stewardship: Yes Referral made to Heart Failure CSW/NCM TOC: NA Referral made to Heart & Vascular TOC clinic: Yes, per patient 04/28/23 @ 10:15 am before [facility] on 04/29/23, couldn't come to a earlier appointment.   Items for Follow-up on DC/TOC: Diet/ fluid restrictions/ daily weights   Rhae Hammock, BSN, RN Heart Failure Print production planner Chat Only

## 2023-04-17 ENCOUNTER — Other Ambulatory Visit (HOSPITAL_COMMUNITY): Payer: Self-pay

## 2023-04-17 ENCOUNTER — Other Ambulatory Visit (HOSPITAL_BASED_OUTPATIENT_CLINIC_OR_DEPARTMENT_OTHER): Payer: Self-pay

## 2023-04-17 DIAGNOSIS — I5033 Acute on chronic diastolic (congestive) heart failure: Secondary | ICD-10-CM | POA: Diagnosis not present

## 2023-04-17 LAB — BASIC METABOLIC PANEL WITH GFR
Anion gap: 11 (ref 5–15)
BUN: 31 mg/dL — ABNORMAL HIGH (ref 8–23)
CO2: 30 mmol/L (ref 22–32)
Calcium: 8.9 mg/dL (ref 8.9–10.3)
Chloride: 93 mmol/L — ABNORMAL LOW (ref 98–111)
Creatinine, Ser: 0.87 mg/dL (ref 0.61–1.24)
GFR, Estimated: >60 mL/min (ref >60)
Glucose, Bld: 108 mg/dL — ABNORMAL HIGH (ref 70–99)
Potassium: 4.8 mmol/L (ref 3.5–5.1)
Sodium: 134 mmol/L — ABNORMAL LOW (ref 135–145)

## 2023-04-17 LAB — BRAIN NATRIURETIC PEPTIDE: B Natriuretic Peptide: 76 pg/mL (ref 0.0–100.0)

## 2023-04-17 MED ORDER — PREDNISONE 20 MG PO TABS
20.0000 mg | ORAL_TABLET | Freq: Every day | ORAL | 0 refills | Status: AC
  Filled 2023-04-17: qty 5, 5d supply, fill #0

## 2023-04-17 MED ORDER — FLUTICASONE FUROATE-VILANTEROL 100-25 MCG/ACT IN AEPB
1.0000 | INHALATION_SPRAY | Freq: Every day | RESPIRATORY_TRACT | 0 refills | Status: AC
Start: 2023-04-18 — End: ?
  Filled 2023-04-17: qty 60, 30d supply, fill #0

## 2023-04-17 MED ORDER — EMPAGLIFLOZIN 10 MG PO TABS
10.0000 mg | ORAL_TABLET | Freq: Every day | ORAL | 2 refills | Status: DC
Start: 2023-04-18 — End: 2023-04-29
  Filled 2023-04-17: qty 30, 30d supply, fill #0

## 2023-04-17 MED ORDER — FUROSEMIDE 40 MG PO TABS
40.0000 mg | ORAL_TABLET | Freq: Every day | ORAL | 2 refills | Status: DC
  Filled 2023-04-17: qty 30, 30d supply, fill #0

## 2023-04-17 MED ORDER — BUTALBITAL-APAP-CAFFEINE 50-325-40 MG PO TABS
1.0000 | ORAL_TABLET | Freq: Four times a day (QID) | ORAL | 0 refills | Status: DC | PRN
  Filled 2023-04-17: qty 20, 5d supply, fill #0

## 2023-04-17 NOTE — Plan of Care (Signed)
  Problem: Health Behavior/Discharge Planning: Goal: Ability to manage health-related needs will improve Outcome: Progressing   Problem: Clinical Measurements: Goal: Respiratory complications will improve Outcome: Progressing Goal: Cardiovascular complication will be avoided Outcome: Progressing   Problem: Activity: Goal: Risk for activity intolerance will decrease Outcome: Progressing   Problem: Nutrition: Goal: Adequate nutrition will be maintained Outcome: Progressing   Problem: Elimination: Goal: Will not experience complications related to urinary retention Outcome: Progressing   Problem: Pain Managment: Goal: General experience of comfort will improve and/or be controlled Outcome: Progressing   Problem: Safety: Goal: Ability to remain free from injury will improve Outcome: Progressing   Problem: Cardiovascular: Goal: Ability to achieve and maintain adequate cardiovascular perfusion will improve Outcome: Progressing

## 2023-04-17 NOTE — Discharge Summary (Signed)
 [facility]  Physician Discharge Summary   Patient ID: Samuel Barnes MRN: [id number] DOB/AGE: 06-03-1950 73 y.o.  Admit date: 04/09/2023 Discharge date: 04/17/2023    PCP: [name], PA  DISCHARGE DIAGNOSES:    Acute on chronic diastolic CHF (congestive heart failure) (HCC)   HLD (hyperlipidemia)   BPH (benign prostatic hyperplasia)   Essential hypertension   Chronic hyponatremia   Paroxysmal atrial fibrillation (HCC)   COPD with acute exacerbation (HCC)   GAD (generalized anxiety disorder)   RECOMMENDATIONS FOR OUTPATIENT FOLLOW UP: Cardiology to schedule outpatient follow-up   Home Health: None Equipment/Devices: None  CODE STATUS: Full code  DISCHARGE CONDITION: fair  Diet recommendation: Heart healthy  INITIAL HISTORY: 73 year old Caucasian male with a past medical history significant for but not limited to paroxysmal atrial fibrillation on anticoagulation, chronic diastolic CHF, history of intracranial hemorrhage and September 2022, BPH, chronic hyponatremia, essential hypertension, hyperlipidemia, who presented from [facility] ED for acute shortness of breath that has been worsening over the last several weeks.  Patient was hospitalized for further management.      Consultants: Cardiology   Procedures: Echocardiogram  HOSPITAL COURSE:   Acute Exacerbation of Diastolic CHF / Severe TR / Mild to Moderate MR / Pulmonary HTN: BNP elevated at 775.4 on Admission.  ECHO this visit shows EF of 55-60% Grade 3 DD.  Patient is seen by cardiology.  Patient underwent right heart catheterization. Started on IV diuretics.  Improved and then transitioned to oral diuretics. Seems to be improving. Beta-blocker on hold due to bradycardia. Noted to be on losartan, Jardiance. Cardiology to schedule outpatient follow-up.   Acute Exacerbation of COPD:  There was concern for COPD exacerbation as well.  Patient was treated with steroids nebulizer  treatments.   Acute Respiratory Failure with Hypoxia, resolved Multifactorial including CHF as well as COPD. Has been weaned off of oxygen.     Paroxysmal atrial fibrillation Has a CHA2DS2-VASc of 3.  Amiodarone discontinued due to prolonged QT interval.  Metoprolol held due to bradycardia.   Stable for the most part.  Anticoagulated with apixaban.     Essential HTN:  C/w Amlodipine 5 mg po Daily;  Holding Metoprolol Succinate 12.5 mg po Daily given Bradycardia.  C/w Terazosin 5 mg po daily.    HLD: C/w Lovastatin substitution for Pravastatin 20 mg po Daily   Hyponatremia:  Likely 2/2 to Hypervolemia but has chronic hypoosmolar hyponatremia.    Hypokalemia: Supplement today.  Magnesium is 2.2.   BPH: C/w Terazosin 5 mg po Daily    Generalized Anxiety Disorder: C/w Buspirone 5 mg po BID and Citalopram 20 mg po Daily    Abnormal LFTs:  Mild and likely in the setting of Volume Overload but was worsening but now slightly improving. Can be monitored in the outpatient setting   Class I Obesity: Complicates overall prognosis and care. Estimated body mass index is 31.47 kg/m as calculated from the following:   Height as of this encounter: 5\' 9"  (1.753 m).   Weight as of this encounter: 96.7 kg. Weight Loss and Dietary Counseling given   Patient stable.  Okay for discharge home today.   PERTINENT LABS:  The results of significant diagnostics from this hospitalization (including imaging, microbiology, ancillary and laboratory) are listed below for reference.    Microbiology: Recent Results (from the past 240 hours)  Resp panel by RT-PCR (RSV, Flu A&B, Covid) Anterior Nasal Swab     Status: None   Collection Time: 04/09/23  2:13 PM   Specimen: Anterior Nasal Swab  Result Value Ref Range Status   SARS Coronavirus 2 by RT PCR NEGATIVE NEGATIVE Final    Comment: (NOTE) SARS-CoV-2 target nucleic acids are NOT DETECTED.  The SARS-CoV-2 RNA is generally detectable in upper  respiratory specimens during the acute phase of infection. The lowest concentration of SARS-CoV-2 viral copies this assay can detect is 138 copies/mL. A negative result does not preclude SARS-Cov-2 infection and should not be used as the sole basis for treatment or other patient management decisions. A negative result may occur with  improper specimen collection/handling, submission of specimen other than nasopharyngeal swab, presence of viral mutation(s) within the areas targeted by this assay, and inadequate number of viral copies(<138 copies/mL). A negative result must be combined with clinical observations, patient history, and epidemiological information. The expected result is Negative.  Fact Sheet for Patients:  BloggerCourse.com  Fact Sheet for Healthcare Providers:  SeriousBroker.it  This test is no t yet approved or cleared by the [place] FDA and  has been authorized for detection and/or diagnosis of SARS-CoV-2 by FDA under an Emergency Use Authorization (EUA). This EUA will remain  in effect (meaning this test can be used) for the duration of the COVID-19 declaration under Section 564(b)(1) of the Act, 21 U.S.C.section 360bbb-3(b)(1), unless the authorization is terminated  or revoked sooner.       Influenza A by PCR NEGATIVE NEGATIVE Final   Influenza B by PCR NEGATIVE NEGATIVE Final    Comment: (NOTE) The Xpert Xpress SARS-CoV-2/FLU/RSV plus assay is intended as an aid in the diagnosis of influenza from Nasopharyngeal swab specimens and should not be used as a sole basis for treatment. Nasal washings and aspirates are unacceptable for Xpert Xpress SARS-CoV-2/FLU/RSV testing.  Fact Sheet for Patients: BloggerCourse.com  Fact Sheet for Healthcare Providers: SeriousBroker.it  This test is not yet approved or cleared by the [place] FDA and has been  authorized for detection and/or diagnosis of SARS-CoV-2 by FDA under an Emergency Use Authorization (EUA). This EUA will remain in effect (meaning this test can be used) for the duration of the COVID-19 declaration under Section 564(b)(1) of the Act, 21 U.S.C. section 360bbb-3(b)(1), unless the authorization is terminated or revoked.     Resp Syncytial Virus by PCR NEGATIVE NEGATIVE Final    Comment: (NOTE) Fact Sheet for Patients: BloggerCourse.com  Fact Sheet for Healthcare Providers: SeriousBroker.it  This test is not yet approved or cleared by the [place] FDA and has been authorized for detection and/or diagnosis of SARS-CoV-2 by FDA under an Emergency Use Authorization (EUA). This EUA will remain in effect (meaning this test can be used) for the duration of the COVID-19 declaration under Section 564(b)(1) of the Act, 21 U.S.C. section 360bbb-3(b)(1), unless the authorization is terminated or revoked.  Performed at [facility], [address] [place], [place] [address]      Labs:   Basic Metabolic Panel: Recent Labs  Lab 04/12/23 2536 04/13/23 1555 04/14/23 0531 04/14/23 0956 04/14/23 1038 04/15/23 0513 04/16/23 0700 04/17/23 0534  NA 133* 133* 134* 134*  --  134* 134* 134*  K 4.0 3.4* 3.1* 3.4*  --  3.3* 4.1 4.8  CL 95* 93* 95*  --   --  96* 95* 93*  CO2 28 28 28   --   --  28 29 30   GLUCOSE 180* 173* 134*  --   --  163* 91 108*  BUN 21 25* 28*  --   --  31* 35* 31*  CREATININE 0.72 1.03 0.79  --   --  0.82 0.75 0.87  CALCIUM 8.7* 9.0 8.9  --   --  8.8* 8.7* 8.9  MG 2.0 2.1  --   --  2.2 2.2 2.2  --   PHOS 3.7 4.3  --   --  4.7* 4.7* 3.8  --    Liver Function Tests: Recent Labs  Lab 04/12/23 0437 04/13/23 1555 04/14/23 1038 04/15/23 0513 04/16/23 0700  AST 56* 67* 67* 57* 52*  ALT 60* 97* 107* 105* 99*  ALKPHOS 70 77 77 76 67  BILITOT 0.8 1.0 0.9 0.9 1.0  PROT 5.9* 6.3* 6.2* 5.8*  5.7*  ALBUMIN 3.4* 3.6 3.5 3.3* 3.3*    CBC: Recent Labs  Lab 04/12/23 0437 04/13/23 1555 04/14/23 0956 04/14/23 1038 04/15/23 0513 04/16/23 0409  WBC 7.3 7.9  --  8.2 9.5 11.2*  NEUTROABS 6.8 7.0  --  7.2 8.4* 9.3*  HGB 12.1* 14.3 15.3 14.6 14.2 14.2  HCT 37.5* 43.7 45.0 44.9 43.2 42.9  MCV 88.4 87.6  --  87.9 87.3 87.6  PLT 167 212  --  236 216 217    IMAGING STUDIES DG CHEST PORT 1 VIEW Result Date: 04/15/2023 CLINICAL DATA:  Shortness of breath. EXAM: PORTABLE CHEST 1 VIEW COMPARISON:  April 12, 2023. FINDINGS: Stable cardiomegaly. Old right rib fractures are noted. Left lung is clear. Small right pleural effusion is noted with associated atelectasis. IMPRESSION: Small right pleural effusion is noted with associated atelectasis. Electronically Signed   By: Lupita Raider M.D.   On: 04/15/2023 09:55   CARDIAC CATHETERIZATION Result Date: 04/14/2023 Conclusions: Moderately elevated left heart and pulmonary artery pressures. Severely elevated right heart filling pressures. Normal to mildly reduced cardiac output/index. Recommendations: Continue diuresis. Yvonne [place], MD [facility]  DG CHEST PORT 1 VIEW Result Date: 04/12/2023 CLINICAL DATA:  Shortness of breath EXAM: PORTABLE CHEST 1 VIEW COMPARISON:  04/11/2023 FINDINGS: Cardiomegaly. Mediastinal contour stable. Right lower airspace opacity is increasing since prior study, likely a combination of pleural effusion and airspace disease. Left lung clear. No edema. No acute bony abnormality. IMPRESSION: Worsening right pleural effusion and right lower lobe atelectasis or infiltrate. Electronically Signed   By: Charlett Nose M.D.   On: 04/12/2023 10:12   DG CHEST PORT 1 VIEW Result Date: 04/11/2023 CLINICAL DATA:  Shortness of breath. EXAM: PORTABLE CHEST 1 VIEW COMPARISON:  April 09, 2023. FINDINGS: Stable cardiomegaly. Mild right basilar atelectasis is noted with small right pleural effusion. Old right rib fractures are noted. Left  lung is clear. IMPRESSION: Mild right basilar atelectasis is noted with small right pleural effusion. Electronically Signed   By: Lupita Raider M.D.   On: 04/11/2023 08:17   ECHOCARDIOGRAM COMPLETE Result Date: 04/10/2023    ECHOCARDIOGRAM REPORT   Patient Name:   KEAWE MARCELLO Date of Exam: 04/10/2023 Medical Rec #:  [id number]             Height:       69.0 in Accession #:    [id number]            Weight:       219.9 lb Date of Birth:  Nov 15, 1950             BSA:          2.151 m Patient Age:    72 years              BP:  157/75 mmHg Patient Gender: M                     HR:           54 bpm. Exam Location:  Inpatient Procedure: 2D Echo, Cardiac Doppler and Color Doppler (Both Spectral and Color            Flow Doppler were utilized during procedure). Indications:    CHF  History:        Patient has prior history of Echocardiogram examinations, most                 recent 07/29/2020. CHF, COPD, Arrythmias:Atrial Fibrillation;                 Risk Factors:Hypertension.  Sonographer:    Darlys Gales Referring Phys: [id number] JUSTIN B HOWERTER IMPRESSIONS  1. Left ventricular ejection fraction, by estimation, is 55 to 60%. The left ventricle has normal function. The left ventricle has no regional wall motion abnormalities. Left ventricular diastolic parameters are consistent with Grade III diastolic dysfunction (restrictive). Elevated left ventricular end-diastolic pressure.  2. Right ventricular systolic function is normal. The right ventricular size is mildly enlarged. There is moderately elevated pulmonary artery systolic pressure. The estimated right ventricular systolic pressure is 51.7 mmHg.  3. Left atrial size was severely dilated.  4. Right atrial size was severely dilated.  5. The mitral valve is abnormal. Mild to moderate mitral valve regurgitation.  6. Tricuspid valve regurgitation is moderate to severe.  7. The aortic valve is tricuspid. Aortic valve regurgitation is trivial.  8. The  inferior vena cava is dilated in size with <50% respiratory variability, suggesting right atrial pressure of 15 mmHg. Comparison(s): Changes from prior study are noted. 07/29/2020: LVEF 55-60%. FINDINGS  Left Ventricle: Left ventricular ejection fraction, by estimation, is 55 to 60%. The left ventricle has normal function. The left ventricle has no regional wall motion abnormalities. The left ventricular internal cavity size was normal in size. There is  no left ventricular hypertrophy. Left ventricular diastolic parameters are consistent with Grade III diastolic dysfunction (restrictive). Elevated left ventricular end-diastolic pressure. Right Ventricle: The right ventricular size is mildly enlarged. No increase in right ventricular wall thickness. Right ventricular systolic function is normal. There is moderately elevated pulmonary artery systolic pressure. The tricuspid regurgitant velocity is 3.03 m/s, and with an assumed right atrial pressure of 15 mmHg, the estimated right ventricular systolic pressure is 51.7 mmHg. Left Atrium: Left atrial size was severely dilated. Right Atrium: Right atrial size was severely dilated. Pericardium: There is no evidence of pericardial effusion. Mitral Valve: The mitral valve is abnormal. There is mild calcification of the anterior and posterior mitral valve leaflet(s). Mild to moderate mitral valve regurgitation. Tricuspid Valve: The tricuspid valve is grossly normal. Tricuspid valve regurgitation is moderate to severe. Aortic Valve: The aortic valve is tricuspid. Aortic valve regurgitation is trivial. Aortic valve mean gradient measures 3.0 mmHg. Aortic valve peak gradient measures 5.0 mmHg. Aortic valve area, by VTI measures 2.63 cm. Pulmonic Valve: The pulmonic valve was normal in structure. Pulmonic valve regurgitation is not visualized. Aorta: The aortic root and ascending aorta are structurally normal, with no evidence of dilitation. Venous: The inferior vena cava is  dilated in size with less than 50% respiratory variability, suggesting right atrial pressure of 15 mmHg. IAS/Shunts: No atrial level shunt detected by color flow Doppler.  LEFT VENTRICLE PLAX 2D LVIDd:         4.00  cm   Diastology LVIDs:         2.30 cm   LV e' medial:    8.27 cm/s LV PW:         0.90 cm   LV E/e' medial:  16.0 LV IVS:        0.80 cm   LV e' lateral:   8.38 cm/s LVOT diam:     1.90 cm   LV E/e' lateral: 15.8 LV SV:         76 LV SV Index:   35 LVOT Area:     2.84 cm  RIGHT VENTRICLE RV Basal diam:  4.60 cm RV Mid diam:    3.80 cm LEFT ATRIUM            Index        RIGHT ATRIUM           Index LA Vol (A4C): 120.0 ml 55.79 ml/m  RA Area:     36.30 cm                                     RA Volume:   137.50 ml 63.92 ml/m  AORTIC VALVE AV Area (Vmax):    2.49 cm AV Area (Vmean):   2.40 cm AV Area (VTI):     2.63 cm AV Vmax:           112.00 cm/s AV Vmean:          85.400 cm/s AV VTI:            0.290 m AV Peak Grad:      5.0 mmHg AV Mean Grad:      3.0 mmHg LVOT Vmax:         98.50 cm/s LVOT Vmean:        72.200 cm/s LVOT VTI:          0.269 m LVOT/AV VTI ratio: 0.93  AORTA Ao Root diam: 3.00 cm MITRAL VALVE                TRICUSPID VALVE MV Area (PHT): 5.84 cm     TR Peak grad:   36.7 mmHg MV Decel Time: 130 msec     TR Vmax:        303.00 cm/s MV E velocity: 132.00 cm/s MV A velocity: 61.90 cm/s   SHUNTS MV E/A ratio:  2.13         Systemic VTI:  0.27 m                             Systemic Diam: 1.90 cm Zoila Shutter MD Electronically signed by Zoila Shutter MD Signature Date/Time: 04/10/2023/11:29:01 AM    Final    CT Head Wo Contrast Result Date: 04/09/2023 CLINICAL DATA:  Headache, increasing frequency or severity. EXAM: CT HEAD WITHOUT CONTRAST TECHNIQUE: Contiguous axial images were obtained from the base of the skull through the vertex without intravenous contrast. RADIATION DOSE REDUCTION: This exam was performed according to the departmental dose-optimization program which includes  automated exposure control, adjustment of the mA and/or kV according to patient size and/or use of iterative reconstruction technique. COMPARISON:  CT scan head from 02/01/2021. FINDINGS: Brain: No evidence of acute infarction, hemorrhage, hydrocephalus, extra-axial collection or mass lesion/mass effect. There is bilateral periventricular hypodensity, which is non-specific but most likely seen in the settings of microvascular  ischemic changes. Mild in extent. otherwise normal appearance of brain parenchyma. Ventricles are normal. Cerebral volume is age appropriate. Vascular: No hyperdense vessel or unexpected calcification. Intracranial arteriosclerosis. Skull: Normal. Negative for fracture or focal lesion. Redemonstration of postsurgical changes from prior left occipital parietal craniotomy. Sinuses/Orbits: No acute finding. Other: Visualized mastoid air cells are unremarkable. No mastoid effusion. IMPRESSION: *No acute intracranial abnormality. Electronically Signed   By: Jules Schick M.D.   On: 04/09/2023 16:08   DG Chest 2 View Result Date: 04/09/2023 CLINICAL DATA:  Shortness of breath. EXAM: CHEST - 2 VIEW COMPARISON:  07/20/2021. FINDINGS: There are atelectatic changes at the lung bases, right more than left. There is subtle blunting of right costophrenic angle, suggesting trace right pleural effusion. Bilateral lung fields are otherwise clear. Left costophrenic angle is clear. Stable cardio-mediastinal silhouette. No acute osseous abnormalities. Multiple old healed right posterolateral rib fractures noted. The soft tissues are within normal limits. IMPRESSION: Atelectatic changes at the lung bases, right more than left. Trace right pleural effusion. Electronically Signed   By: Jules Schick M.D.   On: 04/09/2023 16:05    DISCHARGE EXAMINATION: Vitals:   04/16/23 0800 04/16/23 1525 04/17/23 0546 04/17/23 0800  BP:  (!) 126/57 (!) 150/71 114/60  Pulse: (!) 38  (!) 58 68  Resp:   16 19  Temp:   98.2 F (36.8 C) 97.9 F (36.6 C) 97.6 F (36.4 C)  TempSrc:  Oral Oral Oral  SpO2: 100%  100% 96%  Weight:   95.7 kg   Height:       General appearance: Awake alert.  In no distress Resp: Clear to auscultation bilaterally.  Normal effort Cardio: S1-S2 is normal regular.  No S3-S4.  No rubs murmurs or bruit GI: Abdomen is soft.  Nontender nondistended.  Bowel sounds are present normal.  No masses organomegaly   DISPOSITION: Home  Discharge Instructions     (HEART FAILURE PATIENTS) Call MD:  Anytime you have any of the following symptoms: 1) 3 pound weight gain in 24 hours or 5 pounds in 1 week 2) shortness of breath, with or without a dry hacking cough 3) swelling in the hands, feet or stomach 4) if you have to sleep on extra pillows at night in order to breathe.   Complete by: As directed    Call MD for:  difficulty breathing, headache or visual disturbances   Complete by: As directed    Call MD for:  persistant dizziness or light-headedness   Complete by: As directed    Call MD for:  persistant nausea and vomiting   Complete by: As directed    Call MD for:  severe uncontrolled pain   Complete by: As directed    Call MD for:  temperature >100.4   Complete by: As directed    Diet - low sodium heart healthy   Complete by: As directed    Discharge instructions   Complete by: As directed    Please take your medications as prescribed.  Cardiology will arrange outpatient follow-up.  You were cared for by a hospitalist during your hospital stay. If you have any questions about your discharge medications or the care you received while you were in the hospital after you are discharged, you can call the unit and asked to speak with the hospitalist on call if the hospitalist that took care of you is not available. Once you are discharged, your primary care physician will handle any further medical issues. Please note that  NO REFILLS for any discharge medications will be authorized once you  are discharged, as it is imperative that you return to your primary care physician (or establish a relationship with a primary care physician if you do not have one) for your aftercare needs so that they can reassess your need for medications and monitor your lab values. If you do not have a primary care physician, you can call [phone] for a physician referral.   Increase activity slowly   Complete by: As directed          Allergies as of 04/17/2023       Reactions   Ciprofloxacin Shortness Of Breath, Other (See Comments)   Numbness in extremities   Gabapentin Other (See Comments)   Mental problems, depression, anger   Chlorhexidine Gluconate Other (See Comments)   Burning and itching        Medication List     STOP taking these medications    amiodarone 200 MG tablet Commonly known as: PACERONE   Incruse Ellipta 62.5 MCG/ACT Aepb Generic drug: umeclidinium bromide   metoprolol succinate 25 MG 24 hr tablet Commonly known as: TOPROL-XL       TAKE these medications    acetaminophen 500 MG tablet Commonly known as: TYLENOL Take 1,000 mg by mouth every 6 (six) hours as needed (pain.).   amLODipine 5 MG tablet Commonly known as: NORVASC Take 1 tablet (5 mg total) by mouth daily.   apixaban 5 MG Tabs tablet Commonly known as: ELIQUIS Take 1 tablet (5 mg total) by mouth 2 (two) times daily.   Breo Ellipta 100-25 MCG/ACT Aepb Generic drug: fluticasone furoate-vilanterol Inhale 1 puff into the lungs daily.   busPIRone 5 MG tablet Commonly known as: BUSPAR Take 5 mg by mouth 2 (two) times daily.   butalbital-acetaminophen-caffeine 50-325-40 MG tablet Commonly known as: FIORICET Take 1 tablet by mouth every 6 (six) hours as needed for headache.   citalopram 40 MG tablet Commonly known as: CELEXA Take 40 mg by mouth daily.   folic acid 1 MG tablet Commonly known as: FOLVITE Take 1 tablet (1 mg total) by mouth daily.   furosemide 40 MG tablet Commonly  known as: LASIX Take 1 tablet (40 mg total) by mouth daily. What changed:  medication strength how much to take when to take this reasons to take this   Jardiance 10 MG Tabs tablet Generic drug: empagliflozin Take 1 tablet (10 mg total) by mouth daily.   losartan 25 MG tablet Commonly known as: COZAAR Take 1 tablet (25 mg total) by mouth daily. Pt needs to make appt with provider to continue getting refills   lovastatin 20 MG tablet Commonly known as: MEVACOR Take 1 tablet (20 mg total) by mouth daily.   montelukast 10 MG tablet Commonly known as: SINGULAIR Take 10 mg by mouth daily.   multivitamin with minerals Tabs tablet Take 1 tablet by mouth daily.   polyethylene glycol 17 g packet Commonly known as: MIRALAX / GLYCOLAX Take 17 g by mouth daily as needed for mild constipation.   predniSONE 20 MG tablet Commonly known as: DELTASONE Take 1 tablet (20 mg total) by mouth daily with breakfast for 5 days.   terazosin 5 MG capsule Commonly known as: HYTRIN Take 5 mg by mouth in the morning.   thiamine 100 MG tablet Commonly known as: VITAMIN B1 Take 100 mg by mouth daily.          Follow-up Information     Dan Humphreys,  Storm Frisk, NP Follow up.   Specialty: Cardiology Why: Humberto Seals at [facility] - Cardiology follow-up with Gillian Shields, NP on Tuesday Apr 29, 2023 10:55 AM. Wilmon Arms 15 minutes prior to appointment to check in. Contact information: [address] [place] [address] [phone]         [facility]. Go in 11 day(s).   Specialty: Cardiology Why: Hospital follow up 04/28/2023 @ 10:15 am PLEASE bring a current medication list to appointment FREE valet parking, Entrance C, off [employer] information: [address] [place] [address] [phone]                TOTAL DISCHARGE TIME: 35 minutes  Osvaldo Shipper  [facility] Pager on [email or web address]  04/18/2023, 1:01 PM

## 2023-04-17 NOTE — Progress Notes (Signed)
 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 Patient endorses lightheadedness upon standing and walking. 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 Wife reports hearing patient wheeze at night.  In the ED, patient was hypoxic. Wheezing and 1+ BL LE edema present on exam. No JVD present. Patient given lasix 20mg  X1 and Mg 2g x1. CXR 4/2 showing atelectatic changes at lung bases and trace right pleural effusion, stable cardiomegaly. CXR on 4/5 showing worsening right pleural effusion and right lower lobe atelectasis or infiltrate. No edema. ECHO 4/3 showed LVEF 55-60% (unchanged from prior 2022 echo) with normal LV function. LV diastolic parameters consistent with Grade 3 restrictive diastolic function. RV systolic function is normal. RV mildly enlarged. MV abnormal with mild to moderate regurgitation. Tricuspid valve with moderate to severe regurgitation. Cardiology consulted. RHC 4/8 with RA 15, PA 52/30, PCWP 28, Fick CO/CI 4.5/2.1 - suggesting significant volume overload and preserved cardiac output.   Patient denies SOB and congestion during today's visit. Reports being "a little tired" from ambulating this morning since it's been a while since walking as much. Patient reports overall status is improved since yesterday and feels ready to be discharged. No apparent BL leg swelling on exam. Inquired about his renal function since he has historically denied kidney testing in the past - patient was informed that his kidney numbers are trending back to normal.  Current HF Medications: Diuretic: PO lasix 40mg   daily ACE/ARB/ARNI: Losartan 25mg  daily SGLT2i: jardiance 10mg  daily Other: Kcl BID (last given 4/9)  Prior to admission HF Medications: Diuretic: lasix 20mg  PRN Beta blocker: Metoprolol succinate 25mg  daily ACE/ARB/ARNI: Losartan 25mg  daily  Pertinent Lab Values: Serum creatinine 0.82>0.75>0.87, BUN 31>35>31, Potassium 4.8, Sodium 134 (BL 130s), BNP 76, Magnesium 2.2, A1c 5.9 (2022)  Vital Signs: Weight: 210 lbs (admission weight: 221 lbs) Blood pressure: 130-160s/60-70s  Heart rate: 50-70s  I/O: net -2.43L yesterday; net -15.15L since admission 2L [place] > RA  Medication Assistance / Insurance Benefits Check: Does the patient have prescription insurance?  Yes Type of insurance plan: Medicare  Does the patient qualify for medication assistance through manufacturers or grants?   Yes Eligible grants and/or patient assistance programs: [employer] Approved medication assistance renewals will be completed by: [facility]  Outpatient Pharmacy:  Prior to admission outpatient pharmacy: [facility] mail order pharmacy  Is the patient willing to use [facility] TOC pharmacy at discharge? Yes Is the patient willing to transition their outpatient pharmacy to utilize a [facility] outpatient pharmacy?   Yes    Assessment: 1. Acute on chronic diastolic CHF (LVEF 55-60%), due to unknown etiology. NYHA class 2 symptoms.Strict I/Os and daily weights. Keep K>4 and Mg>2. No JVD or BL edema present on exam per cards today. RHC results reveal significant fluid overload.  - Agree with holding PTA Metoprolol given persistent low HR  - Consider MRA (spironolactone 12.5mg ) on discharge for HFpEF GDMT titration  - Continue jardiance 10mg  daily  - Continue losartan 25mg  daily  - Continue PO lasix 40mg  daily and upon discharge   Plan: 1) Medication changes recommended at  this time: - Recommend adding Spironolactone 12.5mg  today due to stable BUN and Scr   2) Patient assistance: - Patient reports copay for  jardiance of $47 is not affordable  - Patient meets criteria for [employer] and has been approved (copay will be $0 - information provided to [facility] pharmacy system) - [facility] pharmacy contacted to obtain profile transfer from [facility]  3)  Education  - Patient has been educated on current HF medications and potential additions to HF medication regimen - Patient verbalizes understanding that over the next few months, these medication doses may change and more medications may be added to optimize HF regimen - Patient has been educated on basic disease state pathophysiology and goals of therapy   Verdene Rio, PharmD PGY1 Pharmacy Resident

## 2023-04-17 NOTE — Care Management Important Message (Signed)
 Important Message  Patient Details  Name: Samuel Barnes MRN: [id number] Date of Birth: [birth year]   Important Message Given:  Yes - Medicare IM     Renie Ora 04/17/2023, 11:59 AM

## 2023-04-17 NOTE — Progress Notes (Signed)
 Patient Name: Samuel Barnes Date of Encounter: 04/17/2023 [facility] Cardiologist: Armanda Magic, MD   Interval Summary  .    Patient doing well, reports feeling much better today  He is ready to go home  Outpatient appointments have been made with heart failure clinic as well as general cardiology   Vital Signs .    Vitals:   04/16/23 0800 04/16/23 1525 04/17/23 0546 04/17/23 0800  BP:  (!) 126/57 (!) 150/71 114/60  Pulse: (!) 38  (!) 58 68  Resp:   16 19  Temp:  98.2 F (36.8 C) 97.9 F (36.6 C) 97.6 F (36.4 C)  TempSrc:  Oral Oral Oral  SpO2: 100%  100% 96%  Weight:   95.7 kg   Height:       Intake/Output Summary (Last 24 hours) at 04/17/2023 0859 Last data filed at 04/17/2023 8119 Gross per 24 hour  Intake 1120 ml  Output 3375 ml  Net -2255 ml      04/17/2023    5:46 AM 04/16/2023    4:21 AM 04/15/2023    4:00 AM  Last 3 Weights  Weight (lbs) 210 lb 14.4 oz 211 lb 11.2 oz 210 lb 8 oz  Weight (kg) 95.664 kg 96.026 kg 95.482 kg    Telemetry/ECG    Sinus rhythm, HR 60s - Personally Reviewed  Physical Exam .   GEN: No acute distress, on room air.   Neck: No JVD Cardiac: RRR, no murmurs, rubs, or gallops.  Respiratory: Clear to auscultation bilaterally, no increased effort of breathing. GI: Soft, nontender, non-distended  MS: No edema. Scattered bruising throughout both upper extremities. RHC insertion site healing well   Assessment & Plan .   73 y.o. 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 Had remote sleep study 8 years ago and told he did not have OSA.  Presented with worsening SOB, felt to have acute on chornic diastolic CHF.    Acute on chronic HFpEF  Moderate-severe TR Mild-moderate MR Moderate pulmonary HTN Hypertension  RHC showed: moderately elevated left heart and pulmonary artery pressures, severely elevated right heart filling pressures, normal to mildly reduced cardiac output Echo this admission: EF 55-60%, G3DD, elevated LVEDP, normal RV function, moderately elevated PASP, severe bi-atrial enlargement, moderate to severe TR, mild to moderate MR, dilated IVC  Discussed future workup for amyloidosis, noted there are PYP delays until May  Likely requires an outpatient sleep study, patient follows with Dr. Mayford Knife and will arrange with her  Net -15 L this admission Weight down to 210 lb from 212 lb on admission  Creatinine 0.87 today, stable  BUN 31 today, down from 35, improving  BNP 76 today, down from 775 last week  Continue PO Lasix 40 mg daily -- recently transitioned from IV Lasix  Continue amlodipine 5 mg daily Continue Jardiance 10 mg daily Continue losartan 25 mg daily  Consider addition of spironolactone 12.5 mg at discharge, will discuss with MD  Shortness of breath Hyponatremia Currently being treated for acute exacerbation of COPD  CXR 4/8 showed small right pleural effusion  Patient underwent walking oxygen assessment: walked 500 ft without oxygen, SpO 87-96%, HR 70-79 with reported shortness of  breath at end of ambulation, recovered to SpO2 of 98% on RA at rest   Persistent atrial fibrillation Prolonged QTc Currently in sinus this admission with HR 50-60s at rest  Normal TSH Electrolytes normal  Amiodarone was discontinued this admission with prolonged QT interval  Prior A. Fib was noted in the context of heavy alcohol use, which he has since stopped  If he has recurrence of A. Fib would speak with EP for recommendations for care  Continue Eliquis 5 mg BID  Coronary/aortic calcification noted on CT 09/2022   Nuclear stress test from 07/2020 showed: no evidence of acute ischemia, normal LV wall motion, LVEF 65% 10/08/2022: HDL 36; LDL Chol Calc (NIH) 57 04/16/2023: ALT 99  Patient denies any chest pain  Patient has quit smoking in 2022  Continue pravastatin 20 mg daily   Per primary  Transaminitis  Anxiety  BPH Acute exacerbation of COPD Acute respiratory failure with hypoxia   For questions or updates, please contact [facility] Please consult [email or web address] for contact info under        Signed, Olena Leatherwood, PA-C

## 2023-04-17 NOTE — Progress Notes (Signed)
 Patient ambulated in hallway x 500 feet with front wheel walker off O2; continuous SPO2 monitor was applied.  Maintained O2 saturation 87-96% with HR 70-79.  Patient endorsed mild SOB at the very end of ambulation cycle; quick recovery of O2 saturation to 98% on room air at rest.  Lung sounds clear bilaterally.  Will notify Dr. Rito Ehrlich of results.  Continuing to monitor closely.

## 2023-04-17 NOTE — Progress Notes (Signed)
 Mo  04/17/23 1052  Mobility  Activity Ambulated with assistance in hallway  Level of Assistance Standby assist, set-up cues, supervision of patient - no hands on  Assistive Device Front wheel walker  Distance Ambulated (ft) 200 ft  Activity Response Tolerated well  Mobility Referral Yes  Mobility visit 1 Mobility  Mobility Specialist Start Time (ACUTE ONLY) 1052  Mobility Specialist Stop Time (ACUTE ONLY) 1101  Mobility Specialist Time Calculation (min) (ACUTE ONLY) 9 min   Pt agreeable to mobility. Required no physical assistance during ambulation, SV. VSS throughout and no c/o when asked. Pt returned back to bed with all needs met.   Caesar Bookman Mobility Specialist Please contact via SecureChat or [employer] [phone]

## 2023-04-19 ENCOUNTER — Other Ambulatory Visit (HOSPITAL_COMMUNITY): Payer: Self-pay

## 2023-04-19 MED ORDER — METOPROLOL SUCCINATE ER 25 MG PO TB24
25.0000 mg | ORAL_TABLET | Freq: Every day | ORAL | 1 refills | Status: DC
  Filled 2023-04-19 – 2023-04-21 (×2): qty 90, 90d supply, fill #0

## 2023-04-21 ENCOUNTER — Other Ambulatory Visit (HOSPITAL_COMMUNITY): Payer: Self-pay

## 2023-04-28 ENCOUNTER — Encounter (HOSPITAL_COMMUNITY): Payer: Self-pay

## 2023-04-28 ENCOUNTER — Ambulatory Visit (HOSPITAL_COMMUNITY)
Admit: 2023-04-28 | Discharge: 2023-04-28 | Disposition: A | Discharge status: Home / Self Care | Source: Ambulatory Visit | Attending: Adult Health | Admitting: Adult Health

## 2023-04-28 VITALS — BP 105/58 | HR 60 | Ht 69.0 in | Wt 209.4 lb

## 2023-04-28 DIAGNOSIS — I480 Paroxysmal atrial fibrillation: Secondary | ICD-10-CM | POA: Insufficient documentation

## 2023-04-28 DIAGNOSIS — J449 Chronic obstructive pulmonary disease, unspecified: Secondary | ICD-10-CM | POA: Insufficient documentation

## 2023-04-28 DIAGNOSIS — Z7984 Long term (current) use of oral hypoglycemic drugs: Secondary | ICD-10-CM | POA: Diagnosis not present

## 2023-04-28 DIAGNOSIS — Z7901 Long term (current) use of anticoagulants: Secondary | ICD-10-CM | POA: Insufficient documentation

## 2023-04-28 DIAGNOSIS — I110 Hypertensive heart disease with heart failure: Secondary | ICD-10-CM | POA: Insufficient documentation

## 2023-04-28 DIAGNOSIS — I5032 Chronic diastolic (congestive) heart failure: Secondary | ICD-10-CM | POA: Diagnosis present

## 2023-04-28 DIAGNOSIS — Z79899 Other long term (current) drug therapy: Secondary | ICD-10-CM | POA: Diagnosis not present

## 2023-04-28 DIAGNOSIS — I5033 Acute on chronic diastolic (congestive) heart failure: Secondary | ICD-10-CM

## 2023-04-28 LAB — BASIC METABOLIC PANEL WITH GFR
Anion gap: 10 (ref 5–15)
BUN: 16 mg/dL (ref 8–23)
CO2: 25 mmol/L (ref 22–32)
Calcium: 8.9 mg/dL (ref 8.9–10.3)
Chloride: 98 mmol/L (ref 98–111)
Creatinine, Ser: 0.95 mg/dL (ref 0.61–1.24)
GFR, Estimated: >60 mL/min (ref >60)
Glucose, Bld: 85 mg/dL (ref 70–99)
Potassium: 4.6 mmol/L (ref 3.5–5.1)
Sodium: 133 mmol/L — ABNORMAL LOW (ref 135–145)

## 2023-04-28 NOTE — Progress Notes (Signed)
 HEART & VASCULAR TRANSITION OF CARE CONSULT NOTE     Referring Physician: Bentley Bray, PA  Cardiology: Dr Micael Adas.   Chief Complaint: Heart Failure  HPI: Referred to clinic by Dr Lyndon Santiago for heart failure consultation.   Samuel Barnes is a 73 y.o. male with history of HFpEF EF , PAF, hyponatremia, former ETOH, ICH 2022, and COPD.   Admitted 04/09/23 with A/C HFpEF. Echo 55-60% , Mod TR, with grade 3 DD.  RHC was done 4/7 with elevated filling pressure, PVR 2, and Fick CO 4.5.  Diuresed 12 pounds with IV lasix . Transitioned to lasix  40 mg daily. Toprol  was held due to bradycardia. Amio was stopped due to prolonged QT interval. Discharged 04/17/23 with weight 210 pounds.   Overall feeling a little better.  SOB with exertion. Denies PND/Orthopnea. 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 Appetite ok. No fever or chills. Weight at home 205-208  pounds.  O2 sats stable at home. Taking all medications.   Cardiac Testing  Echo 04/10/2023 1. Left ventricular ejection fraction, by estimation, is 55 to 60%. The  left ventricle has normal function. The left ventricle has no regional  wall motion abnormalities. Left ventricular diastolic parameters are  consistent with Grade III diastolic  dysfunction (restrictive). Elevated left ventricular end-diastolic  pressure.   2. Right ventricular systolic function is normal. The right ventricular  size is mildly enlarged. There is moderately elevated pulmonary artery  systolic pressure. The estimated right ventricular systolic pressure is  51.7 mmHg.   3. Left atrial size was severely dilated.   4. Right atrial size was severely dilated.   5. The mitral valve is abnormal. Mild to moderate mitral valve  regurgitation.   6. Tricuspid valve regurgitation is moderate to severe.   7. The aortic valve is tricuspid. Aortic valve regurgitation is trivial.   Cath 04/14/2023  RA (mean): 15 mmHg RV (S/EDP): 52/15 mmHg PA (S/D, mean): 52/30 (37)  mmHg PCWP (mean): 28 mmHg PVR 2 Ao sat: 95% PA sat: 63% Fick CO: 4.5 L/min Fick CI: 2.1 L/min/m^2 Thermodilution CO: 5.7 L/min Thermodilution CI: 2.7 L/min/m^2  Past Medical History:  Diagnosis Date   Anxiety    Aortic atherosclerosis (HCC) 07/28/2020   Atrial fibrillation (HCC)    BPH (benign prostatic hyperplasia)    Carotid artery stenosis, asymptomatic, bilateral    1 to 39% right ICA stenosis and 40 to 59% left ICA stenosis by Dopplers 09/2022   CHF (congestive heart failure) (HCC)    Class 2 obesity 07/28/2020   COPD (chronic obstructive pulmonary disease) (HCC)    Coronary artery calcification 07/28/2020   Dementia (HCC)    Depression    Family history of adverse reaction to anesthesia    Hyperlipidemia 07/28/2020   Hypertension    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     apixaban  (ELIQUIS ) 5 MG TABS tablet Take 1 tablet (5 mg total) by mouth 2 (two) times daily. 180 tablet 1   busPIRone  (BUSPAR ) 5 MG tablet Take 5 mg by mouth 2 (two) times daily.     butalbital -acetaminophen -caffeine  (FIORICET ) 50-325-40 MG tablet Take 1 tablet by mouth every 6 (six) hours as needed for headache. 20 tablet 0  citalopram  (CELEXA ) 40 MG tablet Take 40 mg by mouth daily.     empagliflozin  (JARDIANCE ) 10 MG TABS tablet Take 1 tablet (10 mg total) by mouth daily. 30 tablet 2   fluticasone  furoate-vilanterol (BREO ELLIPTA ) 100-25 MCG/ACT AEPB Inhale 1 puff into the lungs daily. 60 each 0   folic acid  (FOLVITE ) 1 MG tablet Take 1 tablet (1 mg total) by mouth daily. 30 tablet 0   furosemide  (LASIX ) 40 MG tablet Take 1 tablet (40 mg total) by mouth daily. 30 tablet 2   losartan  (COZAAR ) 25 MG tablet Take 1 tablet (25 mg total) by mouth  daily. Pt needs to make appt with provider to continue getting refills 90 tablet 3   lovastatin  (MEVACOR ) 20 MG tablet Take 1 tablet (20 mg total) by mouth daily. 90 tablet 3   montelukast  (SINGULAIR ) 10 MG tablet Take 10 mg by mouth daily.     Multiple Vitamin (MULTIVITAMIN WITH MINERALS) TABS tablet Take 1 tablet by mouth daily.     polyethylene glycol (MIRALAX  / GLYCOLAX ) 17 g packet Take 17 g by mouth daily as needed for mild constipation. 14 each 0   terazosin  (HYTRIN ) 5 MG capsule Take 5 mg by mouth in the morning.     thiamine  (VITAMIN B-1) 100 MG tablet Take 100 mg by mouth daily.     No current facility-administered medications for this encounter.    Allergies  Allergen Reactions   Ciprofloxacin Shortness Of Breath and Other (See Comments)    Numbness in extremities    Gabapentin Other (See Comments)    Mental problems, depression, anger   Chlorhexidine  Gluconate Other (See Comments)    Burning and itching      Social History   Socioeconomic History   Marital status: Divorced    Spouse name: Not on file   Number of children: Not on file   Years of education: Not on file   Highest education level: Not on file  Occupational History   Occupation: retired  Tobacco Use   Smoking status: Former    Current packs/day: 0.00    Average packs/day: 1.5 packs/day for 55.0 years (82.5 ttl pk-yrs)    Types: Cigarettes    Start date: 62    Quit date: 08/14/2020    Years since quitting: 2.7   Smokeless tobacco: Never   Tobacco comments:    Former smoker 01/30/21  Vaping Use   Vaping status: Never Used  Substance and Sexual Activity   Alcohol use: Not Currently    Alcohol/week: 1.0 standard drink of alcohol    Types: 1 Shots of liquor per week   Drug use: Never   Sexual activity: Not on file  Other Topics Concern   Not on file  Social History Narrative   Not on file   Social Drivers of Health   Financial Resource Strain: Low Risk  (04/16/2023)   Overall Financial  Resource Strain (CARDIA)    Difficulty of Paying Living Expenses: Not very hard  Food Insecurity: No Food Insecurity (04/09/2023)   Hunger Vital Sign    Worried About Running Out of Food in the Last Year: Never true    Ran Out of Food in the Last Year: Never true  Transportation Needs: No Transportation Needs (04/16/2023)   PRAPARE - Administrator, Civil Service (Medical): No    Lack of Transportation (Non-Medical): No  Physical Activity: Not on file  Stress: Not on file  Social Connections: Unknown (04/09/2023)   Social  Connection and Isolation Panel [NHANES]    Frequency of Communication with Friends and Family: Never    Frequency of Social Gatherings with Friends and Family: Never    Attends Religious Services: Never    Database administrator or Organizations: Yes    Attends Banker Meetings: Patient declined    Marital Status: Patient declined  Intimate Partner Violence: Not At Risk (04/09/2023)   Humiliation, Afraid, Rape, and Kick questionnaire    Fear of Current or Ex-Partner: No    Emotionally Abused: No    Physically Abused: No    Sexually Abused: No      Family History  Problem Relation Age of Onset   Coronary artery disease Mother    Hypertension Mother    Hyperlipidemia Mother    Ovarian cancer Mother    Ovarian cancer Sister    Liver cancer Sister    Liver cancer Brother     Vitals:   04/28/23 0959 04/28/23 1023  BP:  (!) 105/58  Pulse: 60   SpO2: 95%   Weight: 95 kg (209 lb 6.4 oz)   Height: 5\' 9"  (1.753 m)    Wt Readings from Last 3 Encounters:  04/28/23 95 kg (209 lb 6.4 oz)  04/17/23 95.7 kg (210 lb 14.4 oz)  10/21/22 97 kg (213 lb 12.8 oz)     PHYSICAL EXAM: General:   No resp difficulty. Arrived in a wheel chair.  Neck: supple. no JVD.  Cor: PMI nondisplaced. Regular rate & rhythm. No rubs, gallops or murmurs. Lungs: clear Abdomen: soft, nontender, nondistended.  Extremities: no cyanosis, clubbing, rash, edema Neuro:  alert & oriented x3  ECG:SR with PACs    ASSESSMENT & PLAN: 1. Chronic HFpEF  Echo  EF 55-60% Grade III DD. RV normal. Elevated PASP.  RHC with elevated filling pressures, PVR 2, CO 4.5. Diuresed after cath which would lower PA pressures. Could consider PYP scan. If ordered would check myeloma panel.  NYHA II  GDMT  Diuretic- Continue lasix  40 mg po daily  BB- Recently stopped due to bradycardia Ace/ARB/ARNI- No soft BP.  MRA- Consider down the road.  SGLT2i- Continue jardiance  10 mg daily  2. PAF  Off BB in the hospital due to bradycardia and Amio due to prolonged QRT.  In SR.  Continue eliquis . No recent bleeding issues.     Referred to HFSW (PCP, Medications, Transportation, ETOH Abuse, Drug Abuse, Insurance, Financial ):  No Refer to Pharmacy:  No Refer to Home Health: No Refer to Advanced Heart Failure Clinic: no  Refer to General Cardiology: Established with Dr Micael Adas.   Follow up as needed.   Sylis Ketchum NP-C  10:23 AM

## 2023-04-28 NOTE — Patient Instructions (Signed)
 Labs done today, your results will be available in MyChart, we will contact you for abnormal readings.  Follow-Up in: AS NEEDED   At the [facility], you and your health needs are our priority. We have a designated team specialized in the treatment of Heart Failure. This Care Team includes your primary Heart Failure Specialized Cardiologist (physician), Advanced Practice Providers (APPs- Physician Assistants and Nurse Practitioners), and Pharmacist who all work together to provide you with the care you need, when you need it.   You may see any of the following providers on your designated Care Team at your next follow up:  Dr. Jules Oar Dr. Peder Bourdon Dr. Alwin Baars Dr. Judyth Nunnery Nieves Bars, NP Ruddy Corral, [place] [facility] [place], [place] Dennise Fitz, NP [place] Lee, NP Luster Salters, PharmD   Please be sure to bring in all your medications bottles to every appointment.   Need to Contact Us :  If you have any questions or concerns before your next appointment please send us  a message through [place] or call our office at [phone].    TO LEAVE A MESSAGE FOR THE NURSE SELECT OPTION 2, PLEASE LEAVE A MESSAGE INCLUDING: YOUR NAME DATE OF BIRTH CALL BACK NUMBER REASON FOR CALL**this is important as we prioritize the call backs  YOU WILL RECEIVE A CALL BACK THE SAME DAY AS LONG AS YOU CALL BEFORE 4:00 PM

## 2023-04-29 ENCOUNTER — Encounter (HOSPITAL_BASED_OUTPATIENT_CLINIC_OR_DEPARTMENT_OTHER): Payer: Self-pay | Admitting: Family

## 2023-04-29 ENCOUNTER — Ambulatory Visit (HOSPITAL_BASED_OUTPATIENT_CLINIC_OR_DEPARTMENT_OTHER): Admitting: Family

## 2023-04-29 ENCOUNTER — Other Ambulatory Visit (HOSPITAL_COMMUNITY): Payer: Self-pay

## 2023-04-29 DIAGNOSIS — I480 Paroxysmal atrial fibrillation: Secondary | ICD-10-CM | POA: Diagnosis not present

## 2023-04-29 DIAGNOSIS — I25118 Atherosclerotic heart disease of native coronary artery with other forms of angina pectoris: Secondary | ICD-10-CM

## 2023-04-29 DIAGNOSIS — I5030 Unspecified diastolic (congestive) heart failure: Secondary | ICD-10-CM | POA: Diagnosis not present

## 2023-04-29 DIAGNOSIS — E7800 Pure hypercholesterolemia, unspecified: Secondary | ICD-10-CM

## 2023-04-29 DIAGNOSIS — I6523 Occlusion and stenosis of bilateral carotid arteries: Secondary | ICD-10-CM

## 2023-04-29 DIAGNOSIS — E785 Hyperlipidemia, unspecified: Secondary | ICD-10-CM

## 2023-04-29 DIAGNOSIS — I10 Essential (primary) hypertension: Secondary | ICD-10-CM | POA: Diagnosis not present

## 2023-04-29 DIAGNOSIS — I2510 Atherosclerotic heart disease of native coronary artery without angina pectoris: Secondary | ICD-10-CM

## 2023-04-29 MED ORDER — LOSARTAN POTASSIUM 25 MG PO TABS
25.0000 mg | ORAL_TABLET | Freq: Every day | ORAL | 3 refills | Status: AC
  Filled 2023-04-29: qty 90, 90d supply, fill #0

## 2023-04-29 MED ORDER — EMPAGLIFLOZIN 10 MG PO TABS
10.0000 mg | ORAL_TABLET | Freq: Every day | ORAL | 5 refills | Status: DC
  Filled 2023-04-29 – 2023-05-13 (×5): qty 30, 30d supply, fill #0
  Filled 2023-06-04 – 2023-06-05 (×2): qty 30, 30d supply, fill #1
  Filled 2023-07-03: qty 30, 30d supply, fill #2
  Filled 2023-07-29: qty 30, 30d supply, fill #3
  Filled 2023-09-02: qty 30, 30d supply, fill #4
  Filled 2023-09-30: qty 30, 30d supply, fill #5
  Filled ????-??-??: fill #0

## 2023-04-29 MED ORDER — APIXABAN 5 MG PO TABS
5.0000 mg | ORAL_TABLET | Freq: Two times a day (BID) | ORAL | 1 refills | Status: DC
  Filled 2023-04-29 – 2023-05-12 (×3): qty 180, 90d supply, fill #0
  Filled 2023-06-04 – 2023-06-05 (×2): qty 60, 30d supply, fill #0
  Filled 2023-07-03: qty 60, 30d supply, fill #1
  Filled 2023-07-29: qty 60, 30d supply, fill #2
  Filled 2023-09-02: qty 60, 30d supply, fill #3
  Filled 2023-09-30: qty 60, 30d supply, fill #4
  Filled 2023-10-30: qty 60, 30d supply, fill #5
  Filled ????-??-??: fill #0

## 2023-04-29 MED ORDER — FUROSEMIDE 40 MG PO TABS
40.0000 mg | ORAL_TABLET | Freq: Every day | ORAL | 2 refills | Status: DC
  Filled 2023-04-29 – 2023-05-02 (×2): qty 90, 90d supply, fill #0
  Filled 2023-05-12: qty 60, 30d supply, fill #0
  Filled 2023-05-13: qty 30, 30d supply, fill #0
  Filled 2023-06-04 – 2023-06-05 (×2): qty 30, 30d supply, fill #1
  Filled 2023-07-03: qty 30, 30d supply, fill #2
  Filled 2023-07-29: qty 30, 30d supply, fill #3
  Filled 2023-09-02: qty 30, 30d supply, fill #4
  Filled 2023-09-30: qty 30, 30d supply, fill #5
  Filled 2023-10-30: qty 30, 30d supply, fill #6
  Filled 2023-11-24: qty 30, 30d supply, fill #7
  Filled 2024-01-05: qty 30, 30d supply, fill #8
  Filled ????-??-??: fill #0

## 2023-04-29 MED ORDER — LOVASTATIN 20 MG PO TABS
20.0000 mg | ORAL_TABLET | Freq: Every day | ORAL | 3 refills | Status: AC
  Filled 2023-04-29: qty 90, 90d supply, fill #0

## 2023-04-29 NOTE — Patient Instructions (Addendum)
 Medication Instructions:  Your physician recommends that you continue on your current medications as directed. Please refer to the Current Medication list given to you today.  We have sent your cardiac refills to [facility] for Pill Packs- if you have any issues getting this filled, let us  know   Follow-Up: At [facility], you and your health needs are our priority.  As part of our continuing mission to provide you with exceptional heart care, our providers are all part of one team.  This team includes your primary Cardiologist (physician) and Advanced Practice Providers or APPs (Physician Assistants and Nurse Practitioners) who all work together to provide you with the care you need, when you need it.  Your next appointment:   Please schedule Carotid and office visit on the same say in September, with either Caitlin at [facility] or Dr. Micael Adas at [facility]  Other Instructions:  Recommend weighing daily and keeping a log. Please call our office if you have weight gain of 2 pounds overnight or 5 pounds in 1 week.

## 2023-04-29 NOTE — Progress Notes (Signed)
 Cardiology Office Note:  .   Date:  04/29/2023  ID:  Samuel Barnes, DOB 10-26-1950, MRN [id number] PCP: Bentley Bray, PA  [facility] Providers Cardiologist:  Gaylyn Keas, MD    History of Present Illness: .   Samuel Barnes is a 73 y.o. male  with a hx of CHF, chronic hypoxic respiratory failure on 3L O2, atrial fibrillation on Eliquis , BPH, CAD, HLD, tobacco use, obesity, depression, alcohol abuse.   Admitted 07/28/2020 - 08/08/2020 with acute on chronic diastolic heart failure, paroxysmal new onset atrial fibrillation, and pleural effusion.  No prior cardiac imaging or ischemic testing coronary artery calcifications on previous CT scan from 2019.  Underwent right thoracentesis centesis.TEE 08/02/20 LVEF 55-60%, no left atrial appendage thrombus. DCCV subsequently performed. Only brief restoration of NSR. He was diuresed with IV Lasix  9 litersand discharged on p.o. Lasix .   He was admitted 08/14/20-08/21/20 with Wernicke's encephalopathy, syncope, metabolic encephalopathy. CT/MRI brain unremarkable.    Admitted 09/27/20-10/04/20 after near syncope and fall. He was diagnosed with ICH. CT head with 7mm acute parenchymal bleed with surrounding edema within left ocipital lobe. 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  Underwent angiography showing "a prominently dilated, early draining vein in the left occipital region, draining into the internal cerebral vein, consistent with shunting.  No clear liters seen.  The pain is flow is anterograde." He was cleared to resume Eliquis  on discharge. Given bradycardia, Toprol  further reduced to 25mg  QD. Low dose Amlodipine  was added for blood pressure control.   Cardioversion performed 10/31/20 was unsuccessful. Seen 11/14/20 had self converted to NSR. Admitted 12/20-12/22/22 for ligation of AV fistula.    Admitted 04/09/2023 with acute on chronic HFpEF.  Echo LVEF 55 to 60%,  moderate TR, grade 3 diastolic dysfunction.  RHC 04/14/2023 with elevated filling pressures.  Diuresed 12 pounds with IV Lasix .  Transition to Lasix  40 mg p.o. daily.  Toprol  to bradycardia.  Amio stopped due to prolonged QT interval.  Discharged for 10/25 with discharge weight of 210 pounds.  Evaluated by [facility] yesterday.  He was recommended continue Lasix  40 mg daily.  ACE/ARB/ARNI deferred due to soft blood pressure.  Jardiance  10 mg daily continued.  He was maintaining sinus rhythm.  Presents today for follow-up with his daughter.  Of note he has been taking losartan  25 mg daily since discharge.  Continues to take Lasix  once per day 40 mg and feels overall improved since hospital discharge.  He has been monitoring weight since discharge which has been stable 205-206lbs by home scale. Interested in mail delivery through [employer] and also interested in pill packs. Reviewed reassuring BMET from yesterday. Reports no palpitations.    ROS: Please see the history of present illness.    All other systems reviewed and are negative.   Studies Reviewed: .           Risk Assessment/Calculations:    CHA2DS2-VASc Score = 4   This indicates a 4.8% annual risk of stroke. The patient's score is based upon: CHF History: 1 HTN History: 1 Diabetes History: 0 Stroke History: 0 Vascular Disease History: 1 Age Score: 1 Gender Score: 0            Physical Exam:   VS:  BP (!) 118/50   Pulse 71   Ht 5\' 8"  (1.727 m)   Wt 210 lb 9.6 oz (95.5 kg)  SpO2 98%   BMI 32.02 kg/m    Wt Readings from Last 3 Encounters:  04/29/23 210 lb 9.6 oz (95.5 kg)  04/28/23 209 lb 6.4 oz (95 kg)  04/17/23 210 lb 14.4 oz (95.7 kg)    GEN: Well nourished, well developed in no acute distress NECK: No JVD; No carotid bruits CARDIAC: RRR, no murmurs, rubs, gallops RESPIRATORY:  Clear to auscultation without rales, wheezing or rhonchi  ABDOMEN: Soft, non-tender,  non-distended EXTREMITIES:  No edema; No deformity   ASSESSMENT AND PLAN: .     Medication management - [facility] for adherence packaging and delivery.  PAF / Chronic anticoagulatio-amiodarone  discontinued during 04/2023 hospitalization for prolonged QT interval.  Metoprolol  discontinued due to bradycardia.  Denies palpitations.  Continue Eliquis  5 mg twice daily.  Does not meet dose reduction criteria.   CHA2DS2-VASc Score = 4 [CHF History: 1, HTN History: 1, Diabetes History: 0, Stroke History: 0, Vascular Disease History: 1, Age Score: 1, Gender Score: 0].  Therefore, the patient's annual risk of stroke is 4.8 %.     HTN - BP well controlled. Continue current antihypertensive regimen , losartan  25 mg daily, furosemide  40 mg daily.   Discussed to monitor BP at home at least 2 hours after medications and sitting for 5-10 minutes.   HLD, LDL goal <70 - 10/2022 LDL 57.  Continue Lovastatin  20mg  daily.   HFpEF - Echo 3/25 LVEF 55 to 60%, gr3dd, elevated LVEDP, moderately elevated PASP, mild to moderate MR, moderate to severe TR, trivial AI. RHC 04/14/23 elevated filling pressures. IV diuresed during admission 04/2023. Euvolemic and well compensated on exam.  Low salt diet and fluid <2L restriction encouraged.  Beta-blocker previously discontinued due to bradycardia.  Continue losartan  25 mg daily, furosemide  40 mg daily.  BMP yesterday creatinine 0.95, GFR greater than 60, K4.6.  Coronary artery calcification on CT - Stable with no anginal symptoms. No indication for ischemic evaluation. GDMT includes  lovastatin  20 mg daily.  Beta-blocker previously discontinued due to bradycardia.. No aspirin  due to chronic anticoagulation. Heart healthy diet and regular cardiovascular exercise encouraged.   Tobacco use / ETOH use -encouraged to continue to abstain from tobacco and alcohol use.  Carotid stenosis - 10/04/2022 right 1-3 9% and left 40 to 59% carotid stenosis.   Flow disturbance in left subclavian.  Repeat carotid duplex in 1 year already ordered.         Dispo: follow up 09/2023-day of carotid duplex with Dr. Micael Adas or Clearnce Curia, NP   Signed, Clearnce Curia, NP

## 2023-04-30 ENCOUNTER — Other Ambulatory Visit: Payer: Self-pay

## 2023-05-01 ENCOUNTER — Other Ambulatory Visit (HOSPITAL_COMMUNITY): Payer: Self-pay

## 2023-05-01 ENCOUNTER — Other Ambulatory Visit: Payer: Self-pay

## 2023-05-01 MED ORDER — CITALOPRAM HYDROBROMIDE 40 MG PO TABS
40.0000 mg | ORAL_TABLET | Freq: Every day | ORAL | 3 refills | Status: DC
  Filled 2023-05-01: qty 90, 90d supply, fill #0

## 2023-05-01 MED ORDER — LOSARTAN POTASSIUM 25 MG PO TABS
25.0000 mg | ORAL_TABLET | Freq: Every day | ORAL | 3 refills | Status: AC
Start: 2023-05-01 — End: ?
  Filled 2023-05-01: qty 90, 90d supply, fill #0
  Filled 2023-05-02: qty 30, 30d supply, fill #0
  Filled 2023-05-02: qty 90, 90d supply, fill #0
  Filled 2023-05-12 – 2023-05-13 (×2): qty 30, 30d supply, fill #0
  Filled 2023-06-04 – 2023-06-05 (×2): qty 30, 30d supply, fill #1
  Filled 2023-07-03: qty 30, 30d supply, fill #2
  Filled 2023-07-29: qty 30, 30d supply, fill #3
  Filled 2023-09-02: qty 30, 30d supply, fill #4
  Filled 2023-09-30: qty 30, 30d supply, fill #5
  Filled 2023-10-30: qty 30, 30d supply, fill #6
  Filled 2023-11-24: qty 30, 30d supply, fill #7
  Filled 2024-01-05: qty 30, 30d supply, fill #8
  Filled 2024-01-30: qty 30, 30d supply, fill #9
  Filled ????-??-??: fill #0

## 2023-05-01 MED ORDER — LOVASTATIN 20 MG PO TABS
20.0000 mg | ORAL_TABLET | Freq: Every day | ORAL | 3 refills | Status: AC
  Filled 2023-05-01 – 2023-05-12 (×3): qty 90, 90d supply, fill #0
  Filled 2023-06-04 – 2023-06-05 (×2): qty 30, 30d supply, fill #0
  Filled 2023-07-03: qty 30, 30d supply, fill #1
  Filled 2023-07-29: qty 30, 30d supply, fill #2
  Filled 2023-09-02: qty 30, 30d supply, fill #3
  Filled 2023-09-30: qty 30, 30d supply, fill #4
  Filled 2023-10-30: qty 30, 30d supply, fill #5
  Filled 2023-11-24: qty 30, 30d supply, fill #6
  Filled 2024-01-05: qty 30, 30d supply, fill #7
  Filled 2024-01-30: qty 30, 30d supply, fill #8
  Filled ????-??-??: fill #0

## 2023-05-01 MED ORDER — MONTELUKAST SODIUM 10 MG PO TABS
10.0000 mg | ORAL_TABLET | Freq: Every evening | ORAL | 3 refills | Status: AC
  Filled 2023-05-01 – 2023-05-12 (×3): qty 90, 90d supply, fill #0
  Filled 2023-06-04 – 2023-06-05 (×2): qty 30, 30d supply, fill #0
  Filled 2023-07-03: qty 30, 30d supply, fill #1
  Filled 2023-07-29: qty 30, 30d supply, fill #2
  Filled 2023-09-02: qty 30, 30d supply, fill #3
  Filled 2023-09-30: qty 30, 30d supply, fill #4
  Filled 2023-10-30: qty 30, 30d supply, fill #5
  Filled 2023-11-24: qty 30, 30d supply, fill #6
  Filled 2024-01-05: qty 30, 30d supply, fill #7
  Filled 2024-01-30: qty 30, 30d supply, fill #8
  Filled ????-??-??: fill #0

## 2023-05-01 MED ORDER — TERAZOSIN HCL 5 MG PO CAPS
5.0000 mg | ORAL_CAPSULE | Freq: Every evening | ORAL | 3 refills | Status: AC
  Filled 2023-05-01: qty 90, 90d supply, fill #0
  Filled 2023-05-02 (×2): qty 30, 30d supply, fill #0
  Filled 2023-06-04 – 2023-06-05 (×2): qty 30, 30d supply, fill #1
  Filled 2023-07-03: qty 30, 30d supply, fill #2
  Filled 2023-07-29: qty 30, 30d supply, fill #3
  Filled 2023-09-02: qty 30, 30d supply, fill #4
  Filled 2023-09-30: qty 30, 30d supply, fill #5
  Filled 2023-10-30: qty 30, 30d supply, fill #6
  Filled 2023-11-24: qty 30, 30d supply, fill #7
  Filled 2024-01-05: qty 30, 30d supply, fill #8
  Filled 2024-01-30: qty 30, 30d supply, fill #9

## 2023-05-01 MED ORDER — LOSARTAN POTASSIUM 25 MG PO TABS
25.0000 mg | ORAL_TABLET | Freq: Every day | ORAL | 3 refills | Status: DC
Start: 2023-05-01 — End: 2023-05-01
  Filled 2023-05-01: qty 90, 90d supply, fill #0

## 2023-05-01 MED ORDER — BUTALBITAL-APAP-CAFFEINE 50-325-40 MG PO TABS
1.0000 | ORAL_TABLET | Freq: Four times a day (QID) | ORAL | 5 refills | Status: DC | PRN
Start: 2023-05-01 — End: 2023-05-01
  Filled 2023-05-01: qty 30, 8d supply, fill #0

## 2023-05-01 MED ORDER — CITALOPRAM HYDROBROMIDE 40 MG PO TABS
40.0000 mg | ORAL_TABLET | Freq: Every day | ORAL | 3 refills | Status: AC
  Filled 2023-05-01 – 2023-05-12 (×3): qty 90, 90d supply, fill #0
  Filled 2023-06-04 – 2023-06-05 (×2): qty 30, 30d supply, fill #0
  Filled 2023-07-03: qty 30, 30d supply, fill #1
  Filled 2023-07-29: qty 30, 30d supply, fill #2
  Filled 2023-09-02: qty 30, 30d supply, fill #3
  Filled 2023-09-30: qty 30, 30d supply, fill #4
  Filled 2023-10-30: qty 30, 30d supply, fill #5
  Filled 2023-11-24: qty 30, 30d supply, fill #6
  Filled 2024-01-05: qty 30, 30d supply, fill #7
  Filled 2024-01-30: qty 30, 30d supply, fill #8
  Filled ????-??-??: fill #0

## 2023-05-01 MED ORDER — BUSPIRONE HCL 5 MG PO TABS
5.0000 mg | ORAL_TABLET | Freq: Two times a day (BID) | ORAL | 3 refills | Status: DC | PRN
  Filled 2023-05-01: qty 180, 90d supply, fill #0

## 2023-05-01 MED ORDER — MONTELUKAST SODIUM 10 MG PO TABS
10.0000 mg | ORAL_TABLET | Freq: Every day | ORAL | 3 refills | Status: DC
Start: 2023-05-01 — End: 2023-05-01
  Filled 2023-05-01: qty 90, 90d supply, fill #0

## 2023-05-01 MED ORDER — LOVASTATIN 20 MG PO TABS
20.0000 mg | ORAL_TABLET | Freq: Every day | ORAL | 3 refills | Status: DC
  Filled 2023-05-01: qty 90, 90d supply, fill #0

## 2023-05-01 MED ORDER — BUTALBITAL-APAP-CAFFEINE 50-325-40 MG PO TABS
1.0000 | ORAL_TABLET | Freq: Four times a day (QID) | ORAL | 5 refills | Status: DC | PRN
Start: 2023-05-01 — End: 2023-10-31
  Filled 2023-05-01 – 2023-05-06 (×2): qty 30, 8d supply, fill #0
  Filled 2023-05-23: qty 30, 8d supply, fill #1
  Filled 2023-08-14: qty 30, 8d supply, fill #2
  Filled 2023-09-16: qty 30, 8d supply, fill #3
  Filled 2023-10-16 – 2023-10-21 (×2): qty 30, 8d supply, fill #4

## 2023-05-01 MED ORDER — AMLODIPINE BESYLATE 5 MG PO TABS
5.0000 mg | ORAL_TABLET | Freq: Every day | ORAL | 3 refills | Status: DC
  Filled 2023-05-01: qty 90, 90d supply, fill #0

## 2023-05-01 MED ORDER — TERAZOSIN HCL 5 MG PO CAPS
5.0000 mg | ORAL_CAPSULE | Freq: Every day | ORAL | 3 refills | Status: DC
  Filled 2023-05-01: qty 90, 90d supply, fill #0

## 2023-05-01 MED ORDER — BUSPIRONE HCL 5 MG PO TABS
5.0000 mg | ORAL_TABLET | Freq: Two times a day (BID) | ORAL | 3 refills | Status: AC | PRN
  Filled 2023-05-01: qty 180, 90d supply, fill #0
  Filled 2023-05-12 – 2023-05-13 (×2): qty 60, 30d supply, fill #0

## 2023-05-01 MED ORDER — AMLODIPINE BESYLATE 5 MG PO TABS
5.0000 mg | ORAL_TABLET | Freq: Every day | ORAL | 3 refills | Status: AC
  Filled 2023-05-01: qty 90, 90d supply, fill #0
  Filled 2023-05-02: qty 30, 30d supply, fill #0
  Filled 2023-05-02: qty 90, 90d supply, fill #0
  Filled 2023-05-12 – 2023-05-13 (×2): qty 30, 30d supply, fill #0
  Filled 2023-06-04 – 2023-06-05 (×2): qty 30, 30d supply, fill #1
  Filled 2023-07-03: qty 30, 30d supply, fill #2
  Filled 2023-07-29: qty 30, 30d supply, fill #3
  Filled 2023-09-02: qty 30, 30d supply, fill #4
  Filled 2023-09-30: qty 30, 30d supply, fill #5
  Filled 2023-10-30: qty 30, 30d supply, fill #6
  Filled 2023-11-24: qty 30, 30d supply, fill #7
  Filled 2024-01-05: qty 30, 30d supply, fill #8
  Filled 2024-01-30: qty 30, 30d supply, fill #9
  Filled ????-??-??: fill #0

## 2023-05-02 ENCOUNTER — Other Ambulatory Visit: Payer: Self-pay

## 2023-05-02 ENCOUNTER — Other Ambulatory Visit (HOSPITAL_COMMUNITY): Payer: Self-pay

## 2023-05-06 ENCOUNTER — Other Ambulatory Visit: Payer: Self-pay

## 2023-05-06 ENCOUNTER — Other Ambulatory Visit (HOSPITAL_COMMUNITY): Payer: Self-pay

## 2023-05-09 ENCOUNTER — Other Ambulatory Visit (HOSPITAL_COMMUNITY): Payer: Self-pay

## 2023-05-12 ENCOUNTER — Other Ambulatory Visit (HOSPITAL_COMMUNITY): Payer: Self-pay

## 2023-05-12 ENCOUNTER — Other Ambulatory Visit: Payer: Self-pay

## 2023-05-13 ENCOUNTER — Other Ambulatory Visit: Payer: Self-pay

## 2023-05-13 ENCOUNTER — Other Ambulatory Visit (HOSPITAL_COMMUNITY): Payer: Self-pay

## 2023-05-14 ENCOUNTER — Other Ambulatory Visit: Payer: Self-pay

## 2023-05-14 ENCOUNTER — Other Ambulatory Visit (HOSPITAL_COMMUNITY): Payer: Self-pay

## 2023-05-15 ENCOUNTER — Other Ambulatory Visit (HOSPITAL_COMMUNITY): Payer: Self-pay

## 2023-05-16 ENCOUNTER — Other Ambulatory Visit (HOSPITAL_COMMUNITY): Payer: Self-pay

## 2023-05-19 ENCOUNTER — Other Ambulatory Visit: Payer: Self-pay

## 2023-05-19 ENCOUNTER — Other Ambulatory Visit (HOSPITAL_COMMUNITY): Payer: Self-pay

## 2023-05-23 ENCOUNTER — Other Ambulatory Visit: Payer: Self-pay

## 2023-05-23 ENCOUNTER — Other Ambulatory Visit (HOSPITAL_COMMUNITY): Payer: Self-pay

## 2023-05-26 ENCOUNTER — Other Ambulatory Visit (HOSPITAL_COMMUNITY): Payer: Self-pay

## 2023-05-26 MED ORDER — BUSPIRONE HCL 5 MG PO TABS
5.0000 mg | ORAL_TABLET | Freq: Two times a day (BID) | ORAL | 3 refills | Status: AC
  Filled 2023-06-04 – 2023-06-05 (×2): qty 60, 30d supply, fill #0
  Filled 2023-07-03: qty 60, 30d supply, fill #1
  Filled 2023-07-29: qty 60, 30d supply, fill #2
  Filled 2023-09-02: qty 60, 30d supply, fill #3
  Filled 2023-09-30: qty 60, 30d supply, fill #4
  Filled 2023-10-30: qty 60, 30d supply, fill #5
  Filled 2023-11-24: qty 60, 30d supply, fill #6
  Filled 2024-01-05: qty 60, 30d supply, fill #7
  Filled 2024-01-30: qty 60, 30d supply, fill #8

## 2023-05-28 ENCOUNTER — Other Ambulatory Visit: Payer: Self-pay

## 2023-05-29 ENCOUNTER — Other Ambulatory Visit (HOSPITAL_COMMUNITY): Payer: Self-pay

## 2023-05-30 ENCOUNTER — Other Ambulatory Visit: Payer: Self-pay

## 2023-05-30 ENCOUNTER — Other Ambulatory Visit (HOSPITAL_COMMUNITY): Payer: Self-pay

## 2023-06-03 ENCOUNTER — Ambulatory Visit (HOSPITAL_COMMUNITY): Payer: Self-pay | Admitting: Adult Health

## 2023-06-04 ENCOUNTER — Other Ambulatory Visit: Payer: Self-pay

## 2023-06-05 ENCOUNTER — Other Ambulatory Visit: Payer: Self-pay

## 2023-06-06 ENCOUNTER — Other Ambulatory Visit: Payer: Self-pay

## 2023-07-03 ENCOUNTER — Other Ambulatory Visit: Payer: Self-pay

## 2023-07-07 ENCOUNTER — Other Ambulatory Visit: Payer: Self-pay

## 2023-07-24 ENCOUNTER — Other Ambulatory Visit: Payer: Self-pay

## 2023-07-29 ENCOUNTER — Other Ambulatory Visit: Payer: Self-pay

## 2023-07-30 ENCOUNTER — Other Ambulatory Visit: Payer: Self-pay

## 2023-07-31 ENCOUNTER — Other Ambulatory Visit: Payer: Self-pay

## 2023-08-01 ENCOUNTER — Other Ambulatory Visit: Payer: Self-pay

## 2023-08-14 ENCOUNTER — Other Ambulatory Visit: Payer: Self-pay

## 2023-08-14 ENCOUNTER — Other Ambulatory Visit (HOSPITAL_COMMUNITY): Payer: Self-pay

## 2023-08-22 ENCOUNTER — Other Ambulatory Visit: Payer: Self-pay

## 2023-09-02 ENCOUNTER — Other Ambulatory Visit: Payer: Self-pay

## 2023-09-03 ENCOUNTER — Other Ambulatory Visit: Payer: Self-pay

## 2023-09-11 ENCOUNTER — Ambulatory Visit: Admitting: Cardiology

## 2023-09-11 ENCOUNTER — Encounter: Payer: Self-pay | Admitting: Cardiology

## 2023-09-11 ENCOUNTER — Ambulatory Visit
Admission: RE | Admit: 2023-09-11 | Discharge: 2023-09-11 | Disposition: A | Discharge status: Home / Self Care | Source: Ambulatory Visit | Attending: Cardiology | Admitting: Cardiology

## 2023-09-11 VITALS — BP 132/72 | HR 82 | Ht 68.0 in | Wt 218.2 lb

## 2023-09-11 DIAGNOSIS — E7800 Pure hypercholesterolemia, unspecified: Secondary | ICD-10-CM | POA: Diagnosis not present

## 2023-09-11 DIAGNOSIS — I5030 Unspecified diastolic (congestive) heart failure: Secondary | ICD-10-CM | POA: Insufficient documentation

## 2023-09-11 DIAGNOSIS — I480 Paroxysmal atrial fibrillation: Secondary | ICD-10-CM | POA: Insufficient documentation

## 2023-09-11 DIAGNOSIS — I2510 Atherosclerotic heart disease of native coronary artery without angina pectoris: Secondary | ICD-10-CM

## 2023-09-11 DIAGNOSIS — I6523 Occlusion and stenosis of bilateral carotid arteries: Secondary | ICD-10-CM

## 2023-09-11 DIAGNOSIS — I10 Essential (primary) hypertension: Secondary | ICD-10-CM | POA: Diagnosis not present

## 2023-09-11 DIAGNOSIS — Z79899 Other long term (current) drug therapy: Secondary | ICD-10-CM | POA: Diagnosis present

## 2023-09-11 NOTE — Addendum Note (Signed)
 Addended by: JANIT FRIEZE L on: 09/11/2023 11:11 AM   Modules accepted: Orders

## 2023-09-11 NOTE — Progress Notes (Signed)
 Office Visit    Patient Name: Samuel Barnes Date of Encounter: 09/11/2023  PCP:  Samuel Katheryn HERO, PA   [facility]  Cardiologist:  Samuel Bihari, MD  Advanced Practice Provider:  No care team member to display Electrophysiologist:  None    Chief Complaint    Samuel Barnes is a 73 y.o. male with a hx of CHF, chronic hypoxic respiratory failure on 3L O2, atrial fibrillation on Eliquis , BPH, CAD, HLD, tobacco use, obesity, depression, alcohol abuse presents today for follow up  Past Medical History    Past Medical History:  Diagnosis Date   Anxiety    Aortic atherosclerosis (HCC) 07/28/2020   Atrial fibrillation (HCC)    BPH (benign prostatic hyperplasia)    Carotid artery stenosis, asymptomatic, bilateral    1 to 39% right ICA stenosis and 40 to 59% left ICA stenosis by Dopplers 09/2022   CHF (congestive heart failure) (HCC)    Class 2 obesity 07/28/2020   COPD (chronic obstructive pulmonary disease) (HCC)    Coronary artery calcification 07/28/2020   Dementia (HCC)    Depression    Family history of adverse reaction to anesthesia    Hyperlipidemia 07/28/2020   Hypertension    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   Tobacco use 07/28/2020   Past Surgical History:  Procedure Laterality Date   APPLICATION OF CRANIAL NAVIGATION N/A 12/26/2020   Procedure: APPLICATION OF CRANIAL NAVIGATION;  Surgeon: Samuel Duncans, MD;  Location: [facility] OR;  Service: Neurosurgery;  Laterality: N/A;  RM 21   CARDIOVERSION N/A 08/02/2020   Procedure: CARDIOVERSION;  Surgeon: Samuel Powell BRAVO, MD;  Location: [facility] ENDOSCOPY;  Service: Cardiovascular;  Laterality: N/A;   CARDIOVERSION N/A 10/31/2020   Procedure: CARDIOVERSION;  Surgeon: Samuel Pugh, DO;  Location: [facility] ENDOSCOPY;  Service: Cardiovascular;  Laterality: N/A;   CATARACT EXTRACTION Bilateral 11/2020   CRANIOTOMY Left 12/26/2020    Procedure: Left Subocciptal Cranitomy;  Surgeon: Samuel Duncans, MD;  Location: [facility] OR;  Service: Neurosurgery;  Laterality: Left;   IR ANGIO INTRA EXTRACRAN SEL COM CAROTID INNOMINATE BILAT MOD SED  10/04/2020   IR ANGIO VERTEBRAL SEL SUBCLAVIAN INNOMINATE BILAT MOD SED  10/03/2020   IR THORACENTESIS ASP PLEURAL SPACE W/IMG GUIDE  07/31/2020   IR US  GUIDE VASC ACCESS RIGHT  10/04/2020   RIGHT HEART CATH N/A 04/14/2023   Procedure: RIGHT HEART CATH;  Surgeon: Samuel Bruckner, MD;  Location: [facility] INVASIVE CV LAB;  Service: Cardiovascular;  Laterality: N/A;   TEE WITHOUT CARDIOVERSION N/A 08/02/2020   Procedure: TRANSESOPHAGEAL ECHOCARDIOGRAM (TEE);  Surgeon: Samuel Powell BRAVO, MD;  Location: [facility] ENDOSCOPY;  Service: Cardiovascular;  Laterality: N/A;   TRANSURETHRAL RESECTION OF PROSTATE      Allergies  Allergies  Allergen Reactions   Ciprofloxacin Shortness Of Breath and Other (See Comments)    Numbness in extremities    Gabapentin Other (See Comments)    Mental problems, depression, anger   Chlorhexidine  Gluconate Other (See Comments)    Burning and itching    History of Present Illness    Samuel Barnes is a 73 y.o. male with a hx of CHF, chronic hypoxic respiratory failure on 3L O2, atrial fibrillation on Eliquis , BPH, CAD, HLD, tobacco use, obesity, depression, alcohol abuse last seen 10/18/20  Admitted 07/28/2020 - 08/08/2020 with acute on chronic diastolic heart failure, paroxysmal new onset atrial fibrillation, and pleural effusion.  No prior cardiac imaging or ischemic  testing coronary artery calcifications on previous CT scan from 2019.  Underwent right thoracentesis centesis.TEE 08/02/20 LVEF 55-60%, no left atrial appendage thrombus. DCCV subsequently performed. Only brief restoration of NSR.   He was admitted 08/14/20-08/21/20 with Wernicke's encephalopathy, syncope, metabolic encephalopathy. CT/MRI brain unremarkable. Family noted cognitive decline for past few  weeks.  Admitted 09/27/20-10/04/20 after near syncope and fall. He was diagnosed with ICH. CT head with 7mm acute parenchymal bleed with surrounding edema within left ocipital lobe. 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  Underwent angiography showing a prominently dilated, early draining vein in the left occipital region, draining into the internal cerebral vein, consistent with shunting.   He was cleared to resume Eliquis  on discharge. Given bradycardia, Toprol  further reduced to 25mg  QD. Low dose Amlodipine  was added for blood pressure control.   Seen in follow-up 10/18/2020.  He was still in atrial fibrillation and scheduled for cardioversion which was performed 10/31/2020 but was unsuccessful.   Seen by Samuel Finder, NP on 11/14/2020 and was back in normal sinus rhythm.  No changes were made at that time  Seen by Samuel Finder, NP 04/29/2023 after admission for acute on chronic HFpEF 04/09/2023.  Echo showed EF 55 to 60% with moderate TR and G3 DD.  Right heart cath showed elevated filling pressures.  He was diuresed with IV Lasix  and lost 12 pounds.  Discharged home on Lasix  40 mg daily.  Amiodarone  was stopped due to QT prolongation.  Of note his discharge weight was 210 pounds.  Followed in advanced heart failure clinic.  ACE/ARB/ARNI have been deferred due to soft blood pressure.  He is on Jardiance  10 mg daily and Lasix  40 mg daily.  He has been maintaining sinus rhythm.  He is here today for follow-up and is doing well.  He denies any chest pain or pressure, SOB, DOE, PND, orthopnea, lower extremity edema, dizziness, palpitations, syncope.  EKGs/Labs/Other Studies Reviewed:   The following studies were reviewed today:   ECHO:  TEE 08/02/20   1. Left ventricular ejection fraction, by estimation, is 55 to 60%. The  left ventricle has normal function.   2. Right ventricular systolic function is normal. The right  ventricular  size is normal.   3. Left atrial size was mild to moderately dilated. No left atrial/left  atrial appendage thrombus was detected.   4. Right atrial size was moderately dilated.   5. The mitral valve is normal in structure. Mild mitral valve  regurgitation. No evidence of mitral stenosis.   6. The aortic valve is tricuspid. There is mild calcification of the  aortic valve. There is mild thickening of the aortic valve. Aortic valve  regurgitation is not visualized. Mild aortic valve sclerosis is present,  with no evidence of aortic valve  stenosis.   7. There is Moderate (Grade III) plaque.   8. Successful DCCV performed (200J x1) after TEE with return to NSR.  Please see separate note.   EKG: EKG is not performed today   Recent Labs: 04/09/2023: TSH 0.953 04/16/2023: ALT 99; Hemoglobin 14.2; Magnesium  2.2; Platelets 217 04/17/2023: B Natriuretic Peptide 76.0 04/28/2023: BUN 16; Creatinine, Ser 0.95; Potassium 4.6; Sodium 133  Recent Lipid Panel    Component Value Date/Time   CHOL 106 10/08/2022 1048   TRIG 55 10/08/2022 1048   HDL 36 (L) 10/08/2022 1048   CHOLHDL 2.9 10/08/2022 1048   CHOLHDL 2.1 07/30/2020 0555   VLDL 6  07/30/2020 0555   LDLCALC 57 10/08/2022 1048    Home Medications   Current Meds  Medication Sig   acetaminophen  (TYLENOL ) 500 MG tablet Take 1,000 mg by mouth every 6 (six) hours as needed (pain.).   amLODipine  (NORVASC ) 5 MG tablet Take 1 tablet (5 mg total) by mouth daily.   apixaban  (ELIQUIS ) 5 MG TABS tablet Take 1 tablet (5 mg total) by mouth 2 (two) times daily.   busPIRone  (BUSPAR ) 5 MG tablet Take 5 mg by mouth 2 (two) times daily.   busPIRone  (BUSPAR ) 5 MG tablet Take 1 tablet (5 mg total) by mouth 2 (two) times daily as needed for anxiety.   busPIRone  (BUSPAR ) 5 MG tablet Take 1 tablet (5 mg total) by mouth 2 (two) times daily.   butalbital -acetaminophen -caffeine  (FIORICET ) 50-325-40 MG tablet Take 1 tablet by mouth every 6 (six) hours as  needed for headaches.   citalopram  (CELEXA ) 40 MG tablet Take 40 mg by mouth daily.   citalopram  (CELEXA ) 40 MG tablet Take 1 tablet (40 mg total) by mouth daily.   empagliflozin  (JARDIANCE ) 10 MG TABS tablet Take 1 tablet (10 mg total) by mouth daily.   fluticasone  furoate-vilanterol (BREO ELLIPTA ) 100-25 MCG/ACT AEPB Inhale 1 puff into the lungs daily.   folic acid  (FOLVITE ) 1 MG tablet Take 1 tablet (1 mg total) by mouth daily.   furosemide  (LASIX ) 40 MG tablet Take 1 tablet (40 mg total) by mouth daily.   losartan  (COZAAR ) 25 MG tablet Take 1 tablet (25 mg total) by mouth daily.   losartan  (COZAAR ) 25 MG tablet Take 1 tablet (25 mg total) by mouth daily.   lovastatin  (MEVACOR ) 20 MG tablet Take 1 tablet (20 mg total) by mouth daily.   lovastatin  (MEVACOR ) 20 MG tablet Take 1 tablet (20 mg total) by mouth at bedtime.   montelukast  (SINGULAIR ) 10 MG tablet Take 10 mg by mouth daily.   montelukast  (SINGULAIR ) 10 MG tablet Take 1 tablet (10 mg total) by mouth at bedtime.   Multiple Vitamin (MULTIVITAMIN WITH MINERALS) TABS tablet Take 1 tablet by mouth daily.   polyethylene glycol (MIRALAX  / GLYCOLAX ) 17 g packet Take 17 g by mouth daily as needed for mild constipation.   terazosin  (HYTRIN ) 5 MG capsule Take 5 mg by mouth in the morning.   terazosin  (HYTRIN ) 5 MG capsule Take 1 capsule (5 mg total) by mouth at bedtime.   thiamine  (VITAMIN B-1) 100 MG tablet Take 100 mg by mouth daily.     Review of Systems      All other systems reviewed and are otherwise negative except as noted above.  Physical Exam    VS:  BP 132/72   Pulse 82   Ht 5' 8 (1.727 m)   Wt 218 lb 3.2 oz (99 kg)   SpO2 98%   BMI 33.18 kg/m  , BMI Body mass index is 33.18 kg/m.  Wt Readings from Last 3 Encounters:  09/11/23 218 lb 3.2 oz (99 kg)  04/29/23 210 lb 9.6 oz (95.5 kg)  04/28/23 209 lb 6.4 oz (95 kg)    GEN: Well nourished, well developed in no acute distress HEENT: Normal NECK: No JVD; left carotid  bruit LYMPHATICS: No lymphadenopathy CARDIAC:, no murmurs, rubs, gallops RESPIRATORY:  Clear to auscultation without rales, wheezing or rhonchi  ABDOMEN: Soft, non-tender, non-distended MUSCULOSKELETAL:  No edema; No deformity  SKIN: Warm and dry NEUROLOGIC:  Alert and oriented x 3 PSYCHIATRIC:  Normal affect  Assessment & Plan  PAF / Chronic anticoagulation  -CHA2DS2-VASc Score = 4 [CHF History: 1, HTN History: 1, Diabetes History: 0, Stroke History: 0, Vascular Disease History: 1, Age Score: 1, Gender Score: 0].  Therefore, the patient's annual risk of stroke is 4.8 %.   -Has had several cardioversions in the past -Amiodarone  stopped in hospitalization 04/27/2023 due to prolonged QT -Beta-blockers stopped in the past due to bradycardia -Maintaining normal sinus rhythm on exam today denies any palpitations -No bleeding issues on DOAC -CHA2DS2-VASc score 4 -Continue apixaban  5 mg twice daily -I have personally reviewed and interpreted outside labs performed by patient's PCP which showed serum creatinine 0.95, potassium 4.6 on 04/28/2023.  Hemoglobin 14.2 -TSH was 0.953 on 04/09/2023 -ALT was 99 on 04/16/2023>>repeat today   HTN  - BP controlled on exam today -Continue amlodipine  5 mg daily, losartan  25 mg daily and terazosin  5 mg daily with as needed refills  HLD -LDL goal <70  -I have personally reviewed and interpreted outside labs performed by patient's PCP which showed LDL 57, HDL 36 on 10/08/2022 - Continue lovastatin  20 mg daily with as needed refills  HFpEF  -Echo 07/2020 normal LVEF, gr2DD.   -he weights himself daily and has gained 218lbs (8lbs ups from 04/2023) but thinks it is due to eating sweets and no fluid -He appears euvolemic on exam today -Low salt diet and fluid <2L restriction encouraged.  -Continue Lasix  40 mg daily  Coronary artery calcification on CT  - Denies any anginal symptoms -No indication for ischemic evaluation.  -Continue statin therapy -No  aspirin  due to chronic anticoagulation.   Carotid artery stenosis -dopplers 8/22 showed 1-39% bilateral stenosis -bilateral carotid bruits -Getting carotid Dopplers today -continue statin -no ASA due to DOAC   Disposition: Follow up in 1 year  Signed, Samuel Bihari, MD 09/11/2023, 10:50 AM [facility]

## 2023-09-11 NOTE — Patient Instructions (Signed)
 Medication Instructions:  Your physician recommends that you continue on your current medications as directed. Please refer to the Current Medication list given to you today.  *If you need a refill on your cardiac medications before your next appointment, please call your pharmacy*  Lab Work: Please complete a CMET in our first floor lab before you leave today.  If you have labs (blood work) drawn today and your tests are completely normal, you will receive your results only by: MyChart Message (if you have MyChart) OR A paper copy in the mail If you have any lab test that is abnormal or we need to change your treatment, we will call you to review the results.  Testing/Procedures: None.  Follow-Up: At [facility], you and your health needs are our priority.  As part of our continuing mission to provide you with exceptional heart care, our providers are all part of one team.  This team includes your primary Cardiologist (physician) and Advanced Practice Providers or APPs (Physician Assistants and Nurse Practitioners) who all work together to provide you with the care you need, when you need it.  Your next appointment:   1 year(s)  Provider:   Wilbert Bihari, MD    We recommend signing up for the patient portal called MyChart.  Sign up information is provided on this After Visit Summary.  MyChart is used to connect with patients for Virtual Visits (Telemedicine).  Patients are able to view lab/test results, encounter notes, upcoming appointments, etc.  Non-urgent messages can be sent to your provider as well.   To learn more about what you can do with MyChart, go to [email or web address].

## 2023-09-12 ENCOUNTER — Ambulatory Visit: Payer: Self-pay | Admitting: Cardiology

## 2023-09-12 ENCOUNTER — Encounter: Payer: Self-pay | Admitting: Cardiology

## 2023-09-12 DIAGNOSIS — I6523 Occlusion and stenosis of bilateral carotid arteries: Secondary | ICD-10-CM

## 2023-09-12 LAB — COMPREHENSIVE METABOLIC PANEL WITH GFR
ALT: 16 IU/L (ref 0–44)
AST: 23 IU/L (ref 0–40)
Albumin: 4.3 g/dL (ref 3.8–4.8)
Alkaline Phosphatase: 120 IU/L (ref 44–121)
BUN/Creatinine Ratio: 27 — ABNORMAL HIGH (ref 10–24)
BUN: 20 mg/dL (ref 8–27)
Bilirubin Total: 0.4 mg/dL (ref 0.0–1.2)
CO2: 23 mmol/L (ref 20–29)
Calcium: 9.1 mg/dL (ref 8.6–10.2)
Chloride: 101 mmol/L (ref 96–106)
Creatinine, Ser: 0.75 mg/dL — ABNORMAL LOW (ref 0.76–1.27)
Globulin, Total: 2.1 g/dL (ref 1.5–4.5)
Glucose: 81 mg/dL (ref 70–99)
Potassium: 4.8 mmol/L (ref 3.5–5.2)
Sodium: 138 mmol/L (ref 134–144)
Total Protein: 6.4 g/dL (ref 6.0–8.5)
eGFR: 95 mL/min/1.73 (ref >59)

## 2023-09-15 NOTE — Telephone Encounter (Signed)
 Call to patient to review that Dopplers showed 1-39% right and 40-59% left carotid artery stenosis and <50% stenosis of the common carotid artery bilaterally. Patient verbalizes understanding and agrees to repeat dopplers in 1 year.

## 2023-09-15 NOTE — Telephone Encounter (Signed)
-----   Message from Wilbert Bihari sent at 09/12/2023  2:24 PM EDT ----- Dopplers showed 1-39% right and 40-59% left carotid artery stenosis and <50% stenosis of the common carotid artery bilaterally.  Repeat dopplers in 1 year ----- Message ----- From: Interface, [employer] Sent: 09/11/2023   9:50 AM EDT To: Wilbert JONELLE Bihari, MD

## 2023-09-16 ENCOUNTER — Other Ambulatory Visit: Payer: Self-pay

## 2023-09-16 ENCOUNTER — Other Ambulatory Visit (HOSPITAL_COMMUNITY): Payer: Self-pay

## 2023-09-22 ENCOUNTER — Other Ambulatory Visit: Payer: Self-pay

## 2023-09-30 ENCOUNTER — Other Ambulatory Visit: Payer: Self-pay

## 2023-10-02 ENCOUNTER — Ambulatory Visit (HOSPITAL_BASED_OUTPATIENT_CLINIC_OR_DEPARTMENT_OTHER)
Admission: RE | Admit: 2023-10-02 | Discharge: 2023-10-02 | Disposition: A | Discharge status: Home / Self Care | Source: Ambulatory Visit | Attending: Acute Care | Admitting: Acute Care

## 2023-10-02 DIAGNOSIS — Z87891 Personal history of nicotine dependence: Secondary | ICD-10-CM | POA: Diagnosis present

## 2023-10-02 DIAGNOSIS — Z122 Encounter for screening for malignant neoplasm of respiratory organs: Secondary | ICD-10-CM | POA: Insufficient documentation

## 2023-10-10 ENCOUNTER — Other Ambulatory Visit: Payer: Self-pay | Admitting: Acute Care

## 2023-10-10 DIAGNOSIS — Z122 Encounter for screening for malignant neoplasm of respiratory organs: Secondary | ICD-10-CM

## 2023-10-10 DIAGNOSIS — Z87891 Personal history of nicotine dependence: Secondary | ICD-10-CM

## 2023-10-16 ENCOUNTER — Other Ambulatory Visit (HOSPITAL_COMMUNITY): Payer: Self-pay

## 2023-10-16 ENCOUNTER — Other Ambulatory Visit: Payer: Self-pay

## 2023-10-21 ENCOUNTER — Other Ambulatory Visit (HOSPITAL_COMMUNITY): Payer: Self-pay

## 2023-10-22 ENCOUNTER — Other Ambulatory Visit: Payer: Self-pay

## 2023-10-22 ENCOUNTER — Other Ambulatory Visit (HOSPITAL_BASED_OUTPATIENT_CLINIC_OR_DEPARTMENT_OTHER): Payer: Self-pay | Admitting: Family

## 2023-10-24 ENCOUNTER — Other Ambulatory Visit (HOSPITAL_COMMUNITY): Payer: Self-pay

## 2023-10-24 MED ORDER — EMPAGLIFLOZIN 10 MG PO TABS
10.0000 mg | ORAL_TABLET | Freq: Every day | ORAL | 3 refills | Status: AC
  Filled 2023-10-24 – 2023-10-30 (×2): qty 30, 30d supply, fill #0
  Filled 2023-11-24: qty 30, 30d supply, fill #1
  Filled 2024-01-05: qty 30, 30d supply, fill #2
  Filled 2024-01-30: qty 30, 30d supply, fill #3

## 2023-10-26 ENCOUNTER — Other Ambulatory Visit (HOSPITAL_COMMUNITY): Payer: Self-pay

## 2023-10-27 ENCOUNTER — Other Ambulatory Visit: Payer: Self-pay

## 2023-10-30 ENCOUNTER — Other Ambulatory Visit: Payer: Self-pay

## 2023-10-31 ENCOUNTER — Other Ambulatory Visit (HOSPITAL_COMMUNITY): Payer: Self-pay

## 2023-10-31 ENCOUNTER — Other Ambulatory Visit: Payer: Self-pay

## 2023-10-31 MED ORDER — BUTALBITAL-APAP-CAFFEINE 50-325-40 MG PO TABS
1.0000 | ORAL_TABLET | Freq: Four times a day (QID) | ORAL | 5 refills | Status: AC | PRN
  Filled 2023-10-31: qty 30, 8d supply, fill #0
  Filled 2023-12-10 – 2023-12-11 (×2): qty 30, 8d supply, fill #1
  Filled 2024-01-07 – 2024-01-12 (×3): qty 30, 8d supply, fill #2
  Filled 2024-02-11: qty 30, 8d supply, fill #3

## 2023-11-03 ENCOUNTER — Other Ambulatory Visit: Payer: Self-pay

## 2023-11-24 ENCOUNTER — Other Ambulatory Visit (HOSPITAL_BASED_OUTPATIENT_CLINIC_OR_DEPARTMENT_OTHER): Payer: Self-pay | Admitting: Family

## 2023-11-24 ENCOUNTER — Other Ambulatory Visit: Payer: Self-pay

## 2023-11-24 ENCOUNTER — Other Ambulatory Visit (HOSPITAL_COMMUNITY): Payer: Self-pay

## 2023-11-24 MED ORDER — APIXABAN 5 MG PO TABS
5.0000 mg | ORAL_TABLET | Freq: Two times a day (BID) | ORAL | 1 refills | Status: AC
  Filled 2023-11-24: qty 60, 30d supply, fill #0
  Filled 2024-01-05: qty 60, 30d supply, fill #1
  Filled 2024-01-30: qty 60, 30d supply, fill #2

## 2023-11-24 NOTE — Telephone Encounter (Signed)
 Prescription refill request for Eliquis  received. Indication:afib Last office visit:9/25 Scr:0.82  10/25 Age: 73 Weight:99  kg  Prescription refilled

## 2023-11-26 ENCOUNTER — Other Ambulatory Visit: Payer: Self-pay

## 2023-11-27 ENCOUNTER — Other Ambulatory Visit: Payer: Self-pay

## 2023-12-10 ENCOUNTER — Other Ambulatory Visit (HOSPITAL_COMMUNITY): Payer: Self-pay

## 2023-12-10 ENCOUNTER — Other Ambulatory Visit: Payer: Self-pay

## 2023-12-11 ENCOUNTER — Other Ambulatory Visit (HOSPITAL_COMMUNITY): Payer: Self-pay

## 2024-01-05 ENCOUNTER — Other Ambulatory Visit: Payer: Self-pay

## 2024-01-05 ENCOUNTER — Other Ambulatory Visit (HOSPITAL_COMMUNITY): Payer: Self-pay

## 2024-01-06 ENCOUNTER — Other Ambulatory Visit (HOSPITAL_COMMUNITY): Payer: Self-pay

## 2024-01-06 ENCOUNTER — Other Ambulatory Visit: Payer: Self-pay

## 2024-01-07 ENCOUNTER — Other Ambulatory Visit: Payer: Self-pay

## 2024-01-07 ENCOUNTER — Other Ambulatory Visit (HOSPITAL_COMMUNITY): Payer: Self-pay

## 2024-01-12 ENCOUNTER — Other Ambulatory Visit (HOSPITAL_COMMUNITY): Payer: Self-pay

## 2024-01-13 ENCOUNTER — Other Ambulatory Visit (HOSPITAL_COMMUNITY): Payer: Self-pay

## 2024-01-23 ENCOUNTER — Telehealth: Payer: Self-pay | Admitting: Cardiology

## 2024-01-23 NOTE — Telephone Encounter (Signed)
 Patient c/o Palpitations:  STAT if patient reporting lightheadedness, shortness of breath, or chest pain  How long have you had palpitations/irregular HR/ Afib? Are you having the symptoms now? No   Are you currently experiencing lightheadedness, SOB or CP? No   Do you have a history of afib (atrial fibrillation) or irregular heart rhythm? Yes   Have you checked your BP or HR? (document readings if available): HR was 175 yesterday morning and Monday during episode   Are you experiencing any other symptoms? Lightheadedness, SOB   Pt has been having frequent episodes of racing heart, lightheadedness, and SOB. He has had two this past week. Pt very concerned about symptoms and frequent of episodes.

## 2024-01-23 NOTE — Telephone Encounter (Signed)
 Spoke with pt, he is having frequent episode of fast heart rate. He will get dizzy and SOB, he will lie down on the sofa and after 5 minutes it will settle down. He has had heart rates as high as 175 bpm. His last episode was yesterday. His blood pressure was 76/55. He has an appointment to be seen on Monday. ER precautions discussed.

## 2024-01-26 ENCOUNTER — Ambulatory Visit: Admitting: Cardiology

## 2024-01-26 NOTE — Progress Notes (Unsigned)
 "  Office Visit    Patient Name: Samuel Barnes Date of Encounter: 01/26/2024  PCP:  Duwayne Katheryn HERO, PA   [facility]  Cardiologist:  Wilbert Bihari, MD  Advanced Practice Provider:  No care team member to display Electrophysiologist:  None    Chief Complaint    Samuel Barnes is a 74 y.o. male with a hx of CHF, chronic hypoxic respiratory failure on 3L O2, atrial fibrillation on Eliquis , BPH, CAD, HLD, tobacco use, obesity, depression, alcohol abuse presents today for follow up  Past Medical History    Past Medical History:  Diagnosis Date   Anxiety    Aortic atherosclerosis 07/28/2020   Atrial fibrillation (HCC)    BPH (benign prostatic hyperplasia)    Carotid artery stenosis, asymptomatic, bilateral    Dopplers showed 1-39% right and 40-59% left carotid artery stenosis and <50% stenosis of the common carotid artery bilaterally   CHF (congestive heart failure) (HCC)    Class 2 obesity 07/28/2020   COPD (chronic obstructive pulmonary disease) (HCC)    Coronary artery calcification 07/28/2020   Dementia (HCC)    Depression    Family history of adverse reaction to anesthesia    Hyperlipidemia 07/28/2020   Hypertension    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   Tobacco use 07/28/2020   Past Surgical History:  Procedure Laterality Date   APPLICATION OF CRANIAL NAVIGATION N/A 12/26/2020   Procedure: APPLICATION OF CRANIAL NAVIGATION;  Surgeon: Gillie Duncans, MD;  Location: [facility] OR;  Service: Neurosurgery;  Laterality: N/A;  RM 21   CARDIOVERSION N/A 08/02/2020   Procedure: CARDIOVERSION;  Surgeon: Hobart Powell BRAVO, MD;  Location: [facility] ENDOSCOPY;  Service: Cardiovascular;  Laterality: N/A;   CARDIOVERSION N/A 10/31/2020   Procedure: CARDIOVERSION;  Surgeon: Sheena Pugh, DO;  Location: [facility] ENDOSCOPY;  Service: Cardiovascular;  Laterality: N/A;   CATARACT EXTRACTION Bilateral  11/2020   CRANIOTOMY Left 12/26/2020   Procedure: Left Subocciptal Cranitomy;  Surgeon: Gillie Duncans, MD;  Location: [facility] OR;  Service: Neurosurgery;  Laterality: Left;   IR ANGIO INTRA EXTRACRAN SEL COM CAROTID INNOMINATE BILAT MOD SED  10/04/2020   IR ANGIO VERTEBRAL SEL SUBCLAVIAN INNOMINATE BILAT MOD SED  10/03/2020   IR THORACENTESIS RIGHT ASP PLEURAL SPACE W/IMG GUIDE  07/31/2020   IR US  GUIDE VASC ACCESS RIGHT  10/04/2020   RIGHT HEART CATH N/A 04/14/2023   Procedure: RIGHT HEART CATH;  Surgeon: Mady Bruckner, MD;  Location: [facility] INVASIVE CV LAB;  Service: Cardiovascular;  Laterality: N/A;   TEE WITHOUT CARDIOVERSION N/A 08/02/2020   Procedure: TRANSESOPHAGEAL ECHOCARDIOGRAM (TEE);  Surgeon: Hobart Powell BRAVO, MD;  Location: [facility] ENDOSCOPY;  Service: Cardiovascular;  Laterality: N/A;   TRANSURETHRAL RESECTION OF PROSTATE      Allergies  Allergies  Allergen Reactions   Ciprofloxacin Shortness Of Breath and Other (See Comments)    Numbness in extremities    Gabapentin Other (See Comments)    Mental problems, depression, anger   Chlorhexidine  Gluconate Other (See Comments)    Burning and itching    History of Present Illness    Samuel Barnes is a 74 y.o. male with a hx of CHF, chronic hypoxic respiratory failure on 3L O2, atrial fibrillation on Eliquis , BPH, CAD, HLD, tobacco use, obesity, depression, alcohol abuse last seen 10/18/20  Admitted 07/28/2020 - 08/08/2020 with acute on chronic diastolic heart failure, paroxysmal new onset atrial fibrillation, and pleural effusion.  No prior  cardiac imaging or ischemic testing coronary artery calcifications on previous CT scan from 2019.  Underwent right thoracentesis centesis.TEE 08/02/20 LVEF 55-60%, no left atrial appendage thrombus. DCCV subsequently performed. Only brief restoration of NSR.   He was admitted 08/14/20-08/21/20 with Wernicke's encephalopathy, syncope, metabolic encephalopathy. CT/MRI brain unremarkable. Family noted  cognitive decline for past few weeks.  Admitted 09/27/20-10/04/20 after near syncope and fall. He was diagnosed with ICH. CT head with 7mm acute parenchymal bleed with surrounding edema within left ocipital lobe. 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  Underwent angiography showing a prominently dilated, early draining vein in the left occipital region, draining into the internal cerebral vein, consistent with shunting.   He was cleared to resume Eliquis  on discharge. Given bradycardia, Toprol  further reduced to 25mg  QD. Low dose Amlodipine  was added for blood pressure control.   Seen in follow-up 10/18/2020.  He was still in atrial fibrillation and scheduled for cardioversion which was performed 10/31/2020 but was unsuccessful.   Seen by [name], NP on 11/14/2020 and was back in normal sinus rhythm.  No changes were made at that time  Seen by [name], NP 04/29/2023 after admission for acute on chronic HFpEF 04/09/2023.  Echo showed EF 55 to 60% with moderate TR and G3 DD.  Right heart cath showed elevated filling pressures.  He was diuresed with IV Lasix  and lost 12 pounds.  Discharged home on Lasix  40 mg daily.  Amiodarone  was stopped due to QT prolongation.  Of note his discharge weight was 210 pounds.  Followed in advanced heart failure clinic.  ACE/ARB/ARNI have been deferred due to soft blood pressure.  He is on Jardiance  10 mg daily and Lasix  40 mg daily.    He is here today for evaluation as a work and because of palpitations.  He called into the office on 01/23/2024 complaining of frequent episodes of fast heart rate.  He will get dizzy and short of breath and have to lie down on the sofa and after 5 minutes the symptoms will settle down.  Said heart rates go as high as 175 bpm.  During 1 episode on 01/22/2024 his BP was 76/55 during the episode.  EKGs/Labs/Other Studies Reviewed:   The following studies were reviewed  today:   ECHO:  TEE 08/02/20   1. Left ventricular ejection fraction, by estimation, is 55 to 60%. The  left ventricle has normal function.   2. Right ventricular systolic function is normal. The right ventricular  size is normal.   3. Left atrial size was mild to moderately dilated. No left atrial/left  atrial appendage thrombus was detected.   4. Right atrial size was moderately dilated.   5. The mitral valve is normal in structure. Mild mitral valve  regurgitation. No evidence of mitral stenosis.   6. The aortic valve is tricuspid. There is mild calcification of the  aortic valve. There is mild thickening of the aortic valve. Aortic valve  regurgitation is not visualized. Mild aortic valve sclerosis is present,  with no evidence of aortic valve  stenosis.   7. There is Moderate (Grade III) plaque.   8. Successful DCCV performed (200J x1) after TEE with return to NSR.  Please see separate note.    Recent Labs: 04/09/2023: TSH 0.953 04/16/2023: Hemoglobin 14.2; Magnesium  2.2; Platelets 217 04/17/2023: B Natriuretic Peptide 76.0 09/11/2023: ALT 16; BUN 20; Creatinine, Ser 0.75; Potassium 4.8; Sodium 138  Recent Lipid  Panel    Component Value Date/Time   CHOL 106 10/08/2022 1048   TRIG 55 10/08/2022 1048   HDL 36 (L) 10/08/2022 1048   CHOLHDL 2.9 10/08/2022 1048   CHOLHDL 2.1 07/30/2020 0555   VLDL 6 07/30/2020 0555   LDLCALC 57 10/08/2022 1048    Home Medications   No outpatient medications have been marked as taking for the 01/26/24 encounter (Appointment) with Shlomo Wilbert SAUNDERS, MD.     Review of Systems      All other systems reviewed and are otherwise negative except as noted above.  Physical Exam    VS:  There were no vitals taken for this visit. , BMI There is no height or weight on file to calculate BMI.  Wt Readings from Last 3 Encounters:  09/11/23 218 lb 3.2 oz (99 kg)  04/29/23 210 lb 9.6 oz (95.5 kg)  04/28/23 209 lb 6.4 oz (95 kg)    GEN: Well nourished,  well developed in no acute distress HEENT: Normal NECK: No JVD; No carotid bruits LYMPHATICS: No lymphadenopathy CARDIAC:RRR, no murmurs, rubs, gallops RESPIRATORY:  Clear to auscultation without rales, wheezing or rhonchi  ABDOMEN: Soft, non-tender, non-distended MUSCULOSKELETAL:  No edema; No deformity  SKIN: Warm and dry NEUROLOGIC:  Alert and oriented x 3 PSYCHIATRIC:  Normal affect  Assessment & Plan     PAF / Chronic anticoagulation  -CHA2DS2-VASc Score =   [ ] .  Therefore, the patient's annual risk of stroke is   %.   -Has had several cardioversions in the past -Amiodarone  stopped in hospitalization 04/27/2023 due to prolonged QT -Beta-blockers stopped in the past due to bradycardia -No bleeding issues on DOAC -CHA2DS2-VASc score 4 -Now having frequent episodes of palpitations as high as 175 bpm associate with shortness of breath and low blood pressures.  Usually has to lay down on the couch for this to settle out. -EKG today demonstrated *** - Apixaban  5 mg twice daily with as needed refills -I have personally reviewed and interpreted outside labs performed by patient's PCP which showed *** -TSH was 0.953 on 04/09/2023   HTN  -BP adequately controlled on exam - Continue amlodipine  5 mg daily, losartan  25 mg daily, terazosin  5 mg nightly with as needed refills  HLD -LDL goal <70  -I have personally reviewed and interpreted outside labs performed by patient's PCP which showed *** -Continue lovastatin  20 mg daily with as needed refills  HFpEF  -Echo 07/2020 normal LVEF, gr2DD.   -he weights himself daily and has gained 218lbs (8lbs ups from 04/2023) but thinks it is due to eating sweets and no fluid -He does not appear volume overloaded on exam today. -Low salt diet and fluid <2L restriction encouraged.  -Continue Lasix  40 mg daily with as needed refills  Coronary artery calcification on CT  -Denies any anginal symptoms -Continue lovastatin  20 mg daily with as needed  refills -No aspirin  due to chronic anticoagulation.   Carotid artery stenosis -dopplers 8/22 showed 1-39% bilateral stenosis -bilateral carotid bruits -Carotid Dopplers 09/11/2023 with 1 to 39% right carotid artery stenosis and 60 to 79% left carotid artery stenosis -Continue lovastatin  -no ASA due to DOAC -[facility]   Disposition: Follow up in 1 year  Signed, Wilbert Shlomo, MD 01/26/2024, 11:43 AM [facility] "

## 2024-01-30 ENCOUNTER — Other Ambulatory Visit (HOSPITAL_BASED_OUTPATIENT_CLINIC_OR_DEPARTMENT_OTHER): Payer: Self-pay | Admitting: Family

## 2024-01-30 ENCOUNTER — Other Ambulatory Visit: Payer: Self-pay

## 2024-01-31 ENCOUNTER — Other Ambulatory Visit (HOSPITAL_BASED_OUTPATIENT_CLINIC_OR_DEPARTMENT_OTHER): Payer: Self-pay

## 2024-01-31 ENCOUNTER — Other Ambulatory Visit (HOSPITAL_COMMUNITY): Payer: Self-pay

## 2024-02-02 ENCOUNTER — Other Ambulatory Visit: Payer: Self-pay

## 2024-02-02 MED ORDER — FUROSEMIDE 40 MG PO TABS
40.0000 mg | ORAL_TABLET | Freq: Every day | ORAL | 2 refills | Status: AC
  Filled 2024-02-02: qty 90, 90d supply, fill #0

## 2024-02-03 ENCOUNTER — Other Ambulatory Visit: Payer: Self-pay

## 2024-02-11 ENCOUNTER — Other Ambulatory Visit: Payer: Self-pay

## 2024-02-11 ENCOUNTER — Other Ambulatory Visit (HOSPITAL_COMMUNITY): Payer: Self-pay

## 2024-03-09 ENCOUNTER — Ambulatory Visit: Admitting: Cardiology
